# Patient Record
Sex: Female | Born: 1960 | Race: White | Hispanic: No | Marital: Married | Smoking: Never smoker
Health system: Southern US, Community
[De-identification: ages and names within clinical notes are randomized; demographics above are authoritative.]

## PROBLEM LIST (undated history)

## (undated) DIAGNOSIS — F419 Anxiety disorder, unspecified: Secondary | ICD-10-CM

## (undated) DIAGNOSIS — N1330 Unspecified hydronephrosis: Secondary | ICD-10-CM

## (undated) DIAGNOSIS — M12812 Other specific arthropathies, not elsewhere classified, left shoulder: Secondary | ICD-10-CM

## (undated) DIAGNOSIS — K5909 Other constipation: Secondary | ICD-10-CM

## (undated) DIAGNOSIS — Z8619 Personal history of other infectious and parasitic diseases: Secondary | ICD-10-CM

## (undated) DIAGNOSIS — M75102 Unspecified rotator cuff tear or rupture of left shoulder, not specified as traumatic: Secondary | ICD-10-CM

## (undated) DIAGNOSIS — R112 Nausea with vomiting, unspecified: Secondary | ICD-10-CM

## (undated) DIAGNOSIS — J4520 Mild intermittent asthma, uncomplicated: Secondary | ICD-10-CM

## (undated) DIAGNOSIS — C786 Secondary malignant neoplasm of retroperitoneum and peritoneum: Secondary | ICD-10-CM

## (undated) DIAGNOSIS — K219 Gastro-esophageal reflux disease without esophagitis: Secondary | ICD-10-CM

## (undated) DIAGNOSIS — R32 Unspecified urinary incontinence: Secondary | ICD-10-CM

## (undated) DIAGNOSIS — N135 Crossing vessel and stricture of ureter without hydronephrosis: Secondary | ICD-10-CM

## (undated) DIAGNOSIS — C539 Malignant neoplasm of cervix uteri, unspecified: Secondary | ICD-10-CM

## (undated) DIAGNOSIS — IMO0001 Reserved for inherently not codable concepts without codable children: Secondary | ICD-10-CM

## (undated) DIAGNOSIS — D6481 Anemia due to antineoplastic chemotherapy: Secondary | ICD-10-CM

## (undated) DIAGNOSIS — R350 Frequency of micturition: Secondary | ICD-10-CM

## (undated) DIAGNOSIS — Z9889 Other specified postprocedural states: Secondary | ICD-10-CM

## (undated) DIAGNOSIS — T451X5A Adverse effect of antineoplastic and immunosuppressive drugs, initial encounter: Secondary | ICD-10-CM

## (undated) DIAGNOSIS — R030 Elevated blood-pressure reading, without diagnosis of hypertension: Secondary | ICD-10-CM

## (undated) DIAGNOSIS — G620 Drug-induced polyneuropathy: Secondary | ICD-10-CM

## (undated) HISTORY — PX: TONSILLECTOMY AND ADENOIDECTOMY: SUR1326

---

## 1989-01-26 HISTORY — PX: LAPAROSCOPIC CHOLECYSTECTOMY: SUR755

## 1997-12-08 ENCOUNTER — Other Ambulatory Visit: Admission: RE | Admit: 1997-12-08 | Discharge: 1997-12-08 | Payer: Self-pay | Admitting: Obstetrics and Gynecology

## 2000-06-07 ENCOUNTER — Encounter: Payer: Self-pay | Admitting: Family Medicine

## 2000-06-07 ENCOUNTER — Encounter: Admission: RE | Admit: 2000-06-07 | Discharge: 2000-06-07 | Payer: Self-pay | Admitting: Family Medicine

## 2000-10-08 ENCOUNTER — Encounter: Payer: Self-pay | Admitting: General Surgery

## 2000-10-09 ENCOUNTER — Observation Stay (HOSPITAL_COMMUNITY): Admission: RE | Admit: 2000-10-09 | Discharge: 2000-10-10 | Payer: Self-pay | Admitting: General Surgery

## 2000-12-18 ENCOUNTER — Other Ambulatory Visit: Admission: RE | Admit: 2000-12-18 | Discharge: 2000-12-18 | Payer: Self-pay | Admitting: Obstetrics and Gynecology

## 2001-01-03 ENCOUNTER — Ambulatory Visit (HOSPITAL_COMMUNITY): Admission: RE | Admit: 2001-01-03 | Discharge: 2001-01-03 | Payer: Self-pay | Admitting: Obstetrics and Gynecology

## 2003-01-26 ENCOUNTER — Inpatient Hospital Stay (HOSPITAL_COMMUNITY): Admission: AD | Admit: 2003-01-26 | Discharge: 2003-01-29 | Payer: Self-pay | Admitting: Obstetrics and Gynecology

## 2003-03-09 ENCOUNTER — Other Ambulatory Visit: Admission: RE | Admit: 2003-03-09 | Discharge: 2003-03-09 | Payer: Self-pay | Admitting: Obstetrics and Gynecology

## 2011-11-20 ENCOUNTER — Other Ambulatory Visit: Payer: Self-pay | Admitting: Family Medicine

## 2011-11-20 ENCOUNTER — Other Ambulatory Visit (HOSPITAL_COMMUNITY)
Admission: RE | Admit: 2011-11-20 | Discharge: 2011-11-20 | Disposition: A | Discharge status: Home / Self Care | Payer: BC Managed Care – PPO | Source: Ambulatory Visit | Attending: Family Medicine | Admitting: Family Medicine

## 2011-11-20 DIAGNOSIS — Z124 Encounter for screening for malignant neoplasm of cervix: Secondary | ICD-10-CM | POA: Insufficient documentation

## 2011-11-21 ENCOUNTER — Other Ambulatory Visit: Payer: Self-pay | Admitting: Family Medicine

## 2011-11-21 DIAGNOSIS — R1310 Dysphagia, unspecified: Secondary | ICD-10-CM

## 2011-11-28 ENCOUNTER — Other Ambulatory Visit: Payer: Self-pay | Admitting: Family Medicine

## 2011-11-28 ENCOUNTER — Ambulatory Visit
Admission: RE | Admit: 2011-11-28 | Discharge: 2011-11-28 | Disposition: A | Discharge status: Home / Self Care | Payer: BC Managed Care – PPO | Source: Ambulatory Visit | Attending: Family Medicine | Admitting: Family Medicine

## 2011-11-28 DIAGNOSIS — R1310 Dysphagia, unspecified: Secondary | ICD-10-CM

## 2011-11-28 DIAGNOSIS — Z1231 Encounter for screening mammogram for malignant neoplasm of breast: Secondary | ICD-10-CM

## 2011-12-10 ENCOUNTER — Ambulatory Visit
Admission: RE | Admit: 2011-12-10 | Discharge: 2011-12-10 | Disposition: A | Discharge status: Home / Self Care | Payer: BC Managed Care – PPO | Source: Ambulatory Visit | Attending: Family Medicine | Admitting: Family Medicine

## 2011-12-10 DIAGNOSIS — Z1231 Encounter for screening mammogram for malignant neoplasm of breast: Secondary | ICD-10-CM

## 2013-12-10 ENCOUNTER — Other Ambulatory Visit: Payer: Self-pay

## 2013-12-10 DIAGNOSIS — Z1231 Encounter for screening mammogram for malignant neoplasm of breast: Secondary | ICD-10-CM

## 2013-12-15 ENCOUNTER — Encounter (INDEPENDENT_AMBULATORY_CARE_PROVIDER_SITE_OTHER): Payer: Self-pay

## 2013-12-15 ENCOUNTER — Ambulatory Visit
Admission: RE | Admit: 2013-12-15 | Discharge: 2013-12-15 | Disposition: A | Discharge status: Home / Self Care | Payer: BC Managed Care – PPO | Source: Ambulatory Visit

## 2013-12-15 DIAGNOSIS — Z1231 Encounter for screening mammogram for malignant neoplasm of breast: Secondary | ICD-10-CM

## 2014-05-25 ENCOUNTER — Other Ambulatory Visit (HOSPITAL_COMMUNITY)
Admission: RE | Admit: 2014-05-25 | Discharge: 2014-05-25 | Disposition: A | Discharge status: Home / Self Care | Payer: BC Managed Care – PPO | Source: Ambulatory Visit | Attending: Obstetrics & Gynecology | Admitting: Obstetrics & Gynecology

## 2014-05-25 ENCOUNTER — Other Ambulatory Visit: Payer: Self-pay | Admitting: Obstetrics & Gynecology

## 2014-05-25 DIAGNOSIS — Z1151 Encounter for screening for human papillomavirus (HPV): Secondary | ICD-10-CM | POA: Diagnosis present

## 2014-05-25 DIAGNOSIS — Z01411 Encounter for gynecological examination (general) (routine) with abnormal findings: Secondary | ICD-10-CM | POA: Insufficient documentation

## 2014-05-31 LAB — CYTOLOGY - PAP

## 2014-10-11 ENCOUNTER — Emergency Department (HOSPITAL_COMMUNITY): Payer: BC Managed Care – PPO

## 2014-10-11 ENCOUNTER — Encounter (HOSPITAL_COMMUNITY): Payer: Self-pay | Admitting: *Deleted

## 2014-10-11 ENCOUNTER — Inpatient Hospital Stay (HOSPITAL_COMMUNITY)
Admission: EM | Admit: 2014-10-11 | Discharge: 2014-10-18 | DRG: 853 | Disposition: A | Discharge status: Home / Self Care | Payer: BC Managed Care – PPO | Attending: Internal Medicine | Admitting: Internal Medicine

## 2014-10-11 DIAGNOSIS — C530 Malignant neoplasm of endocervix: Secondary | ICD-10-CM | POA: Diagnosis present

## 2014-10-11 DIAGNOSIS — A408 Other streptococcal sepsis: Secondary | ICD-10-CM | POA: Diagnosis not present

## 2014-10-11 DIAGNOSIS — N179 Acute kidney failure, unspecified: Secondary | ICD-10-CM | POA: Diagnosis present

## 2014-10-11 DIAGNOSIS — Z923 Personal history of irradiation: Secondary | ICD-10-CM

## 2014-10-11 DIAGNOSIS — Z6829 Body mass index (BMI) 29.0-29.9, adult: Secondary | ICD-10-CM

## 2014-10-11 DIAGNOSIS — K521 Toxic gastroenteritis and colitis: Secondary | ICD-10-CM | POA: Diagnosis not present

## 2014-10-11 DIAGNOSIS — N151 Renal and perinephric abscess: Secondary | ICD-10-CM | POA: Diagnosis present

## 2014-10-11 DIAGNOSIS — C539 Malignant neoplasm of cervix uteri, unspecified: Secondary | ICD-10-CM | POA: Insufficient documentation

## 2014-10-11 DIAGNOSIS — E876 Hypokalemia: Secondary | ICD-10-CM | POA: Diagnosis not present

## 2014-10-11 DIAGNOSIS — Z885 Allergy status to narcotic agent status: Secondary | ICD-10-CM

## 2014-10-11 DIAGNOSIS — Z9221 Personal history of antineoplastic chemotherapy: Secondary | ICD-10-CM

## 2014-10-11 DIAGNOSIS — N2889 Other specified disorders of kidney and ureter: Secondary | ICD-10-CM | POA: Diagnosis present

## 2014-10-11 DIAGNOSIS — E440 Moderate protein-calorie malnutrition: Secondary | ICD-10-CM | POA: Insufficient documentation

## 2014-10-11 DIAGNOSIS — Z7951 Long term (current) use of inhaled steroids: Secondary | ICD-10-CM

## 2014-10-11 DIAGNOSIS — Y842 Radiological procedure and radiotherapy as the cause of abnormal reaction of the patient, or of later complication, without mention of misadventure at the time of the procedure: Secondary | ICD-10-CM | POA: Diagnosis present

## 2014-10-11 DIAGNOSIS — A047 Enterocolitis due to Clostridium difficile: Secondary | ICD-10-CM | POA: Diagnosis present

## 2014-10-11 DIAGNOSIS — N131 Hydronephrosis with ureteral stricture, not elsewhere classified: Secondary | ICD-10-CM | POA: Diagnosis present

## 2014-10-11 DIAGNOSIS — R7881 Bacteremia: Secondary | ICD-10-CM | POA: Diagnosis present

## 2014-10-11 DIAGNOSIS — R509 Fever, unspecified: Secondary | ICD-10-CM | POA: Diagnosis not present

## 2014-10-11 DIAGNOSIS — Z9049 Acquired absence of other specified parts of digestive tract: Secondary | ICD-10-CM | POA: Diagnosis present

## 2014-10-11 DIAGNOSIS — R6521 Severe sepsis with septic shock: Secondary | ICD-10-CM | POA: Diagnosis present

## 2014-10-11 DIAGNOSIS — N1330 Unspecified hydronephrosis: Secondary | ICD-10-CM

## 2014-10-11 DIAGNOSIS — E860 Dehydration: Secondary | ICD-10-CM | POA: Diagnosis present

## 2014-10-11 DIAGNOSIS — Z79899 Other long term (current) drug therapy: Secondary | ICD-10-CM

## 2014-10-11 DIAGNOSIS — Z791 Long term (current) use of non-steroidal anti-inflammatories (NSAID): Secondary | ICD-10-CM

## 2014-10-11 DIAGNOSIS — R58 Hemorrhage, not elsewhere classified: Secondary | ICD-10-CM | POA: Diagnosis present

## 2014-10-11 DIAGNOSIS — N3040 Irradiation cystitis without hematuria: Secondary | ICD-10-CM | POA: Diagnosis present

## 2014-10-11 DIAGNOSIS — E871 Hypo-osmolality and hyponatremia: Secondary | ICD-10-CM | POA: Diagnosis present

## 2014-10-11 DIAGNOSIS — T3695XA Adverse effect of unspecified systemic antibiotic, initial encounter: Secondary | ICD-10-CM | POA: Diagnosis not present

## 2014-10-11 DIAGNOSIS — A419 Sepsis, unspecified organism: Secondary | ICD-10-CM

## 2014-10-11 DIAGNOSIS — N135 Crossing vessel and stricture of ureter without hydronephrosis: Secondary | ICD-10-CM | POA: Diagnosis present

## 2014-10-11 LAB — URINE MICROSCOPIC-ADD ON

## 2014-10-11 LAB — COMPREHENSIVE METABOLIC PANEL
ALBUMIN: 2.5 g/dL — AB (ref 3.5–5.0)
ALT: 19 U/L (ref 14–54)
AST: 27 U/L (ref 15–41)
Alkaline Phosphatase: 106 U/L (ref 38–126)
Anion gap: 11 (ref 5–15)
BUN: 11 mg/dL (ref 6–20)
CHLORIDE: 92 mmol/L — AB (ref 101–111)
CO2: 25 mmol/L (ref 22–32)
Calcium: 8.1 mg/dL — ABNORMAL LOW (ref 8.9–10.3)
Creatinine, Ser: 1.16 mg/dL — ABNORMAL HIGH (ref 0.44–1.00)
GFR calc Af Amer: >60 mL/min (ref >60)
GFR, EST NON AFRICAN AMERICAN: 53 mL/min — AB (ref >60)
Glucose, Bld: 110 mg/dL — ABNORMAL HIGH (ref 65–99)
Potassium: 2.9 mmol/L — ABNORMAL LOW (ref 3.5–5.1)
SODIUM: 128 mmol/L — AB (ref 135–145)
Total Bilirubin: 1.1 mg/dL (ref 0.3–1.2)
Total Protein: 7.8 g/dL (ref 6.5–8.1)

## 2014-10-11 LAB — I-STAT CG4 LACTIC ACID, ED: Lactic Acid, Venous: 3.46 mmol/L (ref 0.5–2.0)

## 2014-10-11 LAB — CBC WITH DIFFERENTIAL/PLATELET
BASOS ABS: 0 10*3/uL (ref 0.0–0.1)
Basophils Relative: 0 % (ref 0–1)
Eosinophils Absolute: 0 10*3/uL (ref 0.0–0.7)
Eosinophils Relative: 0 % (ref 0–5)
HEMATOCRIT: 23.1 % — AB (ref 36.0–46.0)
Hemoglobin: 7.3 g/dL — ABNORMAL LOW (ref 12.0–15.0)
LYMPHS PCT: 1 % — AB (ref 12–46)
Lymphs Abs: 0.1 10*3/uL — ABNORMAL LOW (ref 0.7–4.0)
MCH: 29.8 pg (ref 26.0–34.0)
MCHC: 31.6 g/dL (ref 30.0–36.0)
MCV: 94.3 fL (ref 78.0–100.0)
MONO ABS: 0.1 10*3/uL (ref 0.1–1.0)
Monocytes Relative: 1 % — ABNORMAL LOW (ref 3–12)
NEUTROS ABS: 8.5 10*3/uL — AB (ref 1.7–7.7)
Neutrophils Relative %: 98 % — ABNORMAL HIGH (ref 43–77)
Platelets: 409 10*3/uL — ABNORMAL HIGH (ref 150–400)
RBC: 2.45 MIL/uL — AB (ref 3.87–5.11)
RDW: 20.3 % — AB (ref 11.5–15.5)
WBC: 8.7 10*3/uL (ref 4.0–10.5)

## 2014-10-11 LAB — URINALYSIS, ROUTINE W REFLEX MICROSCOPIC
Bilirubin Urine: NEGATIVE
GLUCOSE, UA: NEGATIVE mg/dL
Hgb urine dipstick: NEGATIVE
Ketones, ur: NEGATIVE mg/dL
NITRITE: NEGATIVE
PH: 6 (ref 5.0–8.0)
Protein, ur: 30 mg/dL — AB
Specific Gravity, Urine: 1.017 (ref 1.005–1.030)
Urobilinogen, UA: 1 mg/dL (ref 0.0–1.0)

## 2014-10-11 MED ORDER — DEXTROSE 5 % IV SOLN
2.0000 g | Freq: Once | INTRAVENOUS | Status: AC
  Administered 2014-10-11: 2 g via INTRAVENOUS
  Filled 2014-10-11: qty 2

## 2014-10-11 MED ORDER — DEXTROSE 5 % IV SOLN
2.0000 g | INTRAVENOUS | Status: DC
  Administered 2014-10-12 – 2014-10-14 (×3): 2 g via INTRAVENOUS
  Filled 2014-10-11 (×3): qty 2

## 2014-10-11 MED ORDER — ACETAMINOPHEN 325 MG PO TABS
650.0000 mg | ORAL_TABLET | Freq: Four times a day (QID) | ORAL | Status: DC | PRN
  Administered 2014-10-11 – 2014-10-16 (×8): 650 mg via ORAL
  Filled 2014-10-11 (×8): qty 2

## 2014-10-11 MED ORDER — SODIUM CHLORIDE 0.9 % IV BOLUS (SEPSIS)
2000.0000 mL | Freq: Once | INTRAVENOUS | Status: AC
  Administered 2014-10-11: 2000 mL via INTRAVENOUS

## 2014-10-11 MED ORDER — VANCOMYCIN HCL 10 G IV SOLR
1250.0000 mg | Freq: Once | INTRAVENOUS | Status: AC
  Administered 2014-10-12: 1250 mg via INTRAVENOUS
  Filled 2014-10-11: qty 1250

## 2014-10-11 MED ORDER — SODIUM CHLORIDE 0.9 % IV BOLUS (SEPSIS)
1000.0000 mL | Freq: Once | INTRAVENOUS | Status: AC
  Administered 2014-10-12: 1000 mL via INTRAVENOUS

## 2014-10-11 NOTE — ED Provider Notes (Signed)
The patient is an ill-appearing 54 year old female with a history of uterine cancer, has undergone chemotherapy and radiation therapy, for the last several weeks the patient has had subjective fevers, this is now become objective and is gradually worsening and associated with right-sided flank and right-sided abdominal pain. She also reports some difficulty with urination. Apparently the patient had a CT scan yesterday at an outlying hospital, the report showed some inflammation around the right kidney per report. On exam the patient has tenderness in the right side, right CVA area, suprapubic area, she is tachycardic, she does not appear to be in respiratory distress and has no peripheral edema or suspicious rashes. Her oropharyngeal exam is consistent with dehydration as she has dehydrated mucous membranes otherwise she has a clear nostrils, no lymphadenopathy in a very supple neck.  The patient has an elevated lactic acid at 3.46, this is consistent with a sepsis state, fever of 103, tachycardia to 115 as well as a source of infection which is likely urinary, pyelonephritis suspected. Antibiotics ordered, code sepsis activated, the patient will need to be admitted to the hospital.  Meds given in ED:  Medications  acetaminophen (TYLENOL) tablet 650 mg ( Oral MAR Unhold 10/13/14 1104)  cefTRIAXone (ROCEPHIN) 2 g in dextrose 5 % 50 mL IVPB (2 g Intravenous Given 10/13/14 2232)  0.9 %  sodium chloride infusion ( Intravenous New Bag/Given 10/13/14 1527)  vancomycin (VANCOCIN) IVPB 750 mg/150 ml premix (750 mg Intravenous Given 10/14/14 0007)  fentaNYL (SUBLIMAZE) injection 12.5 mcg ( Intravenous MAR Unhold 10/13/14 1104)  albuterol (PROVENTIL) (2.5 MG/3ML) 0.083% nebulizer solution 2.5 mg (not administered)  pantoprazole (PROTONIX) EC tablet 40 mg (40 mg Oral Given 10/14/14 0948)  cefTRIAXone (ROCEPHIN) 2 g in dextrose 5 % 50 mL IVPB (0 g Intravenous Stopped 10/11/14 2258)  sodium chloride 0.9 % bolus 2,000 mL  (0 mLs Intravenous Stopped 10/12/14 0016)  vancomycin (VANCOCIN) 1,250 mg in sodium chloride 0.9 % 250 mL IVPB (0 mg Intravenous Stopped 10/12/14 0151)  sodium chloride 0.9 % bolus 1,000 mL (0 mLs Intravenous Stopped 10/12/14 0259)  iohexol (OMNIPAQUE) 300 MG/ML solution 50 mL (50 mLs Oral Contrast Given 10/12/14 0036)  iohexol (OMNIPAQUE) 300 MG/ML solution 100 mL (100 mLs Intravenous Contrast Given 10/12/14 0112)  potassium chloride 10 mEq in 100 mL IVPB (0 mEq Intravenous Stopped 10/12/14 0701)  sodium chloride 0.9 % bolus 1,000 mL (0 mLs Intravenous Stopped 10/12/14 0521)  norepinephrine (LEVOPHED) 4 mg in dextrose 5 % 250 mL (0.016 mg/mL) infusion (0 mcg/min Intravenous Stopped 10/12/14 0812)  sodium chloride 0.9 % bolus 1,000 mL (1,000 mLs Intravenous New Bag/Given 10/12/14 0831)  0.9 %  sodium chloride infusion ( Intravenous New Bag/Given 10/12/14 1121)  vitamin A & D ointment (1 application  Given 3/33/54 1736)  fentaNYL (SUBLIMAZE) 100 MCG/2ML injection (  Duplicate 5/62/56 3893)  midazolam (VERSED) 2 MG/2ML injection (  Duplicate 7/34/28 7681)  midazolam (VERSED) injection (0.5 mg Intravenous Given 10/12/14 1516)  fentaNYL (SUBLIMAZE) injection (25 mcg Intravenous Given 10/12/14 1517)  furosemide (LASIX) injection 20 mg (20 mg Intravenous Given 10/12/14 2013)  potassium chloride SA (K-DUR,KLOR-CON) CR tablet 40 mEq (40 mEq Oral Given 10/13/14 1533)    CRITICAL CARE Performed by: Noemi Chapel D Total critical care time: 35 Critical care time was exclusive of separately billable procedures and treating other patients. Critical care was necessary to treat or prevent imminent or life-threatening deterioration. Critical care was time spent personally by me on the following activities: development of treatment plan  with patient and/or surrogate as well as nursing, discussions with consultants, evaluation of patient's response to treatment, examination of patient, obtaining history from patient or  surrogate, ordering and performing treatments and interventions, ordering and review of laboratory studies, ordering and review of radiographic studies, pulse oximetry and re-evaluation of patient's condition.   Noemi Chapel, MD 10/14/14 1006

## 2014-10-11 NOTE — ED Notes (Signed)
Pt's family reports fever x 3 weeks, has tried OTC antipyretics without relief.  Pt is being treated for uterine and cervical cancer.  Reports she had her last chemo and radiation 4/8.   States they have called her oncologist and would not see pt, has pending appt this Thursday.  Also reports decreased appetite.

## 2014-10-11 NOTE — ED Notes (Signed)
MD Sabra Heck made aware of abnormal lactic acid value

## 2014-10-11 NOTE — Progress Notes (Signed)
ANTIBIOTIC CONSULT NOTE - INITIAL  Pharmacy Consult for ceftriaxone Indication: sepsis w/ UTI  Allergies  Allergen Reactions  . Codeine Nausea Only  . Hydrocodone Nausea And Vomiting    Patient Measurements: Height: 5\' 4"  (162.6 cm) IBW/kg (Calculated) : 54.7 Adjusted Body Weight:   Vital Signs: Temp: 103 F (39.4 C) (05/16 2004) Temp Source: Oral (05/16 2004) BP: 109/58 mmHg (05/16 2030) Pulse Rate: 118 (05/16 2030) Intake/Output from previous day:   Intake/Output from this shift:    Labs: No results for input(s): WBC, HGB, PLT, LABCREA, CREATININE in the last 72 hours. CrCl cannot be calculated (Unknown ideal weight.). No results for input(s): VANCOTROUGH, VANCOPEAK, VANCORANDOM, GENTTROUGH, GENTPEAK, GENTRANDOM, TOBRATROUGH, TOBRAPEAK, TOBRARND, AMIKACINPEAK, AMIKACINTROU, AMIKACIN in the last 72 hours.   Microbiology: No results found for this or any previous visit (from the past 720 hour(s)).  Medical History: Past Medical History  Diagnosis Date  . Cancer     uterine and cervical   Assessment: 74 YOF presents with fever and abdominal pain, she is undergoing chemo for uterine and cervical cancer.  Pharmacy asked to dose ceftriaxone for sepsis with UTI.   5/16 >> ceftriaxone >>  5/16 blood 5/16 Urine  Tm in ED = 103  Goal of Therapy:  Dose per patient-specific parameters  Plan:   Continue ceftriaxone 2gm IV q24h for sepsis with UTI  No dose adjustment needed at this time  Doreene Eland, PharmD, BCPS.   Pager: [phone] 10/11/2014,8:54 PM

## 2014-10-11 NOTE — ED Provider Notes (Signed)
CSN: [id number]     Arrival date & time 10/11/14  1956 History   First MD Initiated Contact with Patient 10/11/14 2021     Chief Complaint  Patient presents with  . Fever  . Abdominal Pain     (Consider location/radiation/quality/duration/timing/severity/associated sxs/prior Treatment) HPI Comments: Patient is a 54 year old female with uterine and cervical cancer who is being treated at a [facility] facility in [place] who presents with a fever for 3 weeks and right flank pain for 2 weeks. Symptoms started gradually and progressively worsened since the onset. The pain is aching and severe with radiation to her right abdomen. Palpation makes the pain worse. No alleviating factors. Patient's fever has been around 101F at home. She has been treating her fever with OTC antipyretics at home. She called her Oncologist today in hopes of being seen and was told to wait for her appointment in 3 days. Patient was started on Cipro for possible UTI about 8 days ago which provided no relief. Patient also had an outpatient CT scan at a [facility] which showed right hydronephrosis and right kidney inflammation. Patient received her last chemo infusion of Cisplatin on 09/03/2014 as well as her last radiation treatment.   Dr. Lynnda Child Oncologist Dr. Randa Spike Onc   Past Medical History  Diagnosis Date  . Cancer     uterine and cervical   Past Surgical History  Procedure Laterality Date  . Cholecystectomy    . Tonsillectomy     No family history on file. History  Substance Use Topics  . Smoking status: Never Smoker   . Smokeless tobacco: Not on file  . Alcohol Use: No   OB History    No data available     Review of Systems  Constitutional: Positive for fever. Negative for chills and fatigue.  HENT: Negative for trouble swallowing.   Eyes: Negative for visual disturbance.  Respiratory: Negative for shortness of breath.   Cardiovascular: Negative for chest pain  and palpitations.  Gastrointestinal: Positive for abdominal pain. Negative for nausea, vomiting and diarrhea.  Genitourinary: Positive for flank pain. Negative for dysuria and difficulty urinating.  Musculoskeletal: Negative for arthralgias and neck pain.  Skin: Negative for color change.  Neurological: Negative for dizziness and weakness.  Psychiatric/Behavioral: Negative for dysphoric mood.      Allergies  Codeine and Hydrocodone  Home Medications   Prior to Admission medications   Medication Sig Start Date End Date Taking? Authorizing Provider  ADVAIR DISKUS 250-50 MCG/DOSE AEPB  10/02/14   Historical Provider, MD  LORazepam (ATIVAN) 1 MG tablet  09/29/14   Historical Provider, MD  ondansetron (ZOFRAN-ODT) 8 MG disintegrating tablet  08/11/14   Historical Provider, MD   BP 109/58 mmHg  Pulse 118  Temp(Src) 103 F (39.4 C) (Oral)  Resp 19  Ht 5\' 4"  (1.626 m)  SpO2 100% Physical Exam  Constitutional: She appears well-developed and well-nourished. No distress.  HENT:  Head: Normocephalic and atraumatic.  Eyes: Conjunctivae and EOM are normal.  Neck: Normal range of motion.  Cardiovascular: Regular rhythm.  Exam reveals no gallop and no friction rub.   No murmur heard. Tachycardic.   Pulmonary/Chest: Effort normal and breath sounds normal. She has no wheezes. She has no rales. She exhibits no tenderness.  Abdominal: Soft. She exhibits no distension. There is tenderness. There is no rebound.  Right sided abdominal tenderness to palpation. No other focal tenderness or peritoneal signs.   Genitourinary:  Right CVA tenderness to palpation.  Musculoskeletal: Normal range of motion.  Neurological: She is alert. Coordination normal.  Speech is goal-oriented. Moves limbs without ataxia.   Skin: Skin is warm and dry.  Psychiatric: She has a normal mood and affect. Her behavior is normal.  Nursing note and vitals reviewed.   ED Course  Procedures (including critical care  time)  CRITICAL CARE Performed by: Alvina Chou   Total critical care time: 1 hour  Critical care time was exclusive of separately billable procedures and treating other patients.  Critical care was necessary to treat or prevent imminent or life-threatening deterioration.  Critical care was time spent personally by me on the following activities: development of treatment plan with patient and/or surrogate as well as nursing, discussions with consultants, evaluation of patient's response to treatment, examination of patient, obtaining history from patient or surrogate, ordering and performing treatments and interventions, ordering and review of laboratory studies, ordering and review of radiographic studies, pulse oximetry and re-evaluation of patient's condition.   Labs Review Labs Reviewed  CBC WITH DIFFERENTIAL/PLATELET - Abnormal; Notable for the following:    RBC 2.45 (*)    Hemoglobin 7.3 (*)    HCT 23.1 (*)    RDW 20.3 (*)    Platelets 409 (*)    Neutrophils Relative % 98 (*)    Neutro Abs 8.5 (*)    Lymphocytes Relative 1 (*)    Lymphs Abs 0.1 (*)    Monocytes Relative 1 (*)    All other components within normal limits  COMPREHENSIVE METABOLIC PANEL - Abnormal; Notable for the following:    Sodium 128 (*)    Potassium 2.9 (*)    Chloride 92 (*)    Glucose, Bld 110 (*)    Creatinine, Ser 1.16 (*)    Calcium 8.1 (*)    Albumin 2.5 (*)    GFR calc non Af Amer 53 (*)    All other components within normal limits  URINALYSIS, ROUTINE W REFLEX MICROSCOPIC - Abnormal; Notable for the following:    Color, Urine AMBER (*)    APPearance CLOUDY (*)    Protein, ur 30 (*)    Leukocytes, UA MODERATE (*)    All other components within normal limits  I-STAT CG4 LACTIC ACID, ED - Abnormal; Notable for the following:    Lactic Acid, Venous 3.46 (*)    All other components within normal limits  I-STAT CG4 LACTIC ACID, ED - Abnormal; Notable for the following:    Lactic Acid,  Venous 2.80 (*)    All other components within normal limits  CULTURE, BLOOD (ROUTINE X 2)  CULTURE, BLOOD (ROUTINE X 2)  URINE CULTURE  URINE MICROSCOPIC-ADD ON  I-STAT CG4 LACTIC ACID, ED  I-STAT CG4 LACTIC ACID, ED    Imaging Review Dg Chest Port 1 View  10/11/2014   CLINICAL DATA:  54 year old female with 5 week history of fever. Medical history includes uterine and cervical cancer.  EXAM: PORTABLE CHEST - 1 VIEW  COMPARISON:  None.  FINDINGS: The lungs are clear and negative for focal airspace consolidation, pulmonary edema or suspicious pulmonary nodule. No pleural effusion or pneumothorax. Cardiac and mediastinal contours are within normal limits. No acute fracture or lytic or blastic osseous lesions. The visualized upper abdominal bowel gas pattern is unremarkable.  IMPRESSION: No active cardiopulmonary disease.   Electronically Signed   By: Jacqulynn Cadet M.D.   On: 10/11/2014 21:20     EKG Interpretation None      MDM   Final  diagnoses:  Sepsis, due to unspecified organism    8:55 PM Sepsis work up started. Patient started on rocephin. Patient febrile and tachycardic at this time.   12:36 AM Patient started on Rocephin and added Vancomycin to her regimen. Discussed patient with Urologist on call who recommends re-imaging. Chest xray unremarkable.    Alvina Chou, PA-C 10/13/14 3794  Noemi Chapel, MD 10/14/14 1006

## 2014-10-12 ENCOUNTER — Encounter (HOSPITAL_COMMUNITY): Payer: Self-pay

## 2014-10-12 ENCOUNTER — Inpatient Hospital Stay (HOSPITAL_COMMUNITY): Payer: BC Managed Care – PPO

## 2014-10-12 ENCOUNTER — Emergency Department (HOSPITAL_COMMUNITY): Payer: BC Managed Care – PPO

## 2014-10-12 ENCOUNTER — Other Ambulatory Visit: Payer: Self-pay | Admitting: Urology

## 2014-10-12 DIAGNOSIS — E440 Moderate protein-calorie malnutrition: Secondary | ICD-10-CM | POA: Diagnosis present

## 2014-10-12 DIAGNOSIS — K521 Toxic gastroenteritis and colitis: Secondary | ICD-10-CM | POA: Diagnosis not present

## 2014-10-12 DIAGNOSIS — E871 Hypo-osmolality and hyponatremia: Secondary | ICD-10-CM | POA: Diagnosis present

## 2014-10-12 DIAGNOSIS — A047 Enterocolitis due to Clostridium difficile: Secondary | ICD-10-CM | POA: Diagnosis present

## 2014-10-12 DIAGNOSIS — N2889 Other specified disorders of kidney and ureter: Secondary | ICD-10-CM | POA: Diagnosis not present

## 2014-10-12 DIAGNOSIS — C530 Malignant neoplasm of endocervix: Secondary | ICD-10-CM | POA: Diagnosis present

## 2014-10-12 DIAGNOSIS — N1330 Unspecified hydronephrosis: Secondary | ICD-10-CM | POA: Diagnosis not present

## 2014-10-12 DIAGNOSIS — E876 Hypokalemia: Secondary | ICD-10-CM | POA: Diagnosis not present

## 2014-10-12 DIAGNOSIS — N179 Acute kidney failure, unspecified: Secondary | ICD-10-CM | POA: Diagnosis present

## 2014-10-12 DIAGNOSIS — Z6829 Body mass index (BMI) 29.0-29.9, adult: Secondary | ICD-10-CM | POA: Diagnosis not present

## 2014-10-12 DIAGNOSIS — N151 Renal and perinephric abscess: Secondary | ICD-10-CM | POA: Diagnosis present

## 2014-10-12 DIAGNOSIS — A408 Other streptococcal sepsis: Secondary | ICD-10-CM | POA: Diagnosis present

## 2014-10-12 DIAGNOSIS — E860 Dehydration: Secondary | ICD-10-CM | POA: Diagnosis present

## 2014-10-12 DIAGNOSIS — IMO0001 Reserved for inherently not codable concepts without codable children: Secondary | ICD-10-CM | POA: Insufficient documentation

## 2014-10-12 DIAGNOSIS — A419 Sepsis, unspecified organism: Secondary | ICD-10-CM | POA: Diagnosis not present

## 2014-10-12 DIAGNOSIS — R6521 Severe sepsis with septic shock: Secondary | ICD-10-CM | POA: Diagnosis present

## 2014-10-12 DIAGNOSIS — Z79899 Other long term (current) drug therapy: Secondary | ICD-10-CM | POA: Diagnosis not present

## 2014-10-12 DIAGNOSIS — N131 Hydronephrosis with ureteral stricture, not elsewhere classified: Secondary | ICD-10-CM | POA: Diagnosis present

## 2014-10-12 DIAGNOSIS — Z923 Personal history of irradiation: Secondary | ICD-10-CM | POA: Diagnosis not present

## 2014-10-12 DIAGNOSIS — Z791 Long term (current) use of non-steroidal anti-inflammatories (NSAID): Secondary | ICD-10-CM | POA: Diagnosis not present

## 2014-10-12 DIAGNOSIS — T3695XA Adverse effect of unspecified systemic antibiotic, initial encounter: Secondary | ICD-10-CM | POA: Diagnosis not present

## 2014-10-12 DIAGNOSIS — Z885 Allergy status to narcotic agent status: Secondary | ICD-10-CM | POA: Diagnosis not present

## 2014-10-12 DIAGNOSIS — G459 Transient cerebral ischemic attack, unspecified: Secondary | ICD-10-CM | POA: Diagnosis not present

## 2014-10-12 DIAGNOSIS — Y842 Radiological procedure and radiotherapy as the cause of abnormal reaction of the patient, or of later complication, without mention of misadventure at the time of the procedure: Secondary | ICD-10-CM | POA: Diagnosis present

## 2014-10-12 DIAGNOSIS — A499 Bacterial infection, unspecified: Secondary | ICD-10-CM | POA: Diagnosis not present

## 2014-10-12 DIAGNOSIS — N3040 Irradiation cystitis without hematuria: Secondary | ICD-10-CM | POA: Diagnosis present

## 2014-10-12 DIAGNOSIS — R509 Fever, unspecified: Secondary | ICD-10-CM | POA: Diagnosis present

## 2014-10-12 DIAGNOSIS — Z9221 Personal history of antineoplastic chemotherapy: Secondary | ICD-10-CM | POA: Diagnosis not present

## 2014-10-12 DIAGNOSIS — Z9049 Acquired absence of other specified parts of digestive tract: Secondary | ICD-10-CM | POA: Diagnosis present

## 2014-10-12 DIAGNOSIS — R58 Hemorrhage, not elsewhere classified: Secondary | ICD-10-CM | POA: Diagnosis present

## 2014-10-12 DIAGNOSIS — Z7951 Long term (current) use of inhaled steroids: Secondary | ICD-10-CM | POA: Diagnosis not present

## 2014-10-12 DIAGNOSIS — C539 Malignant neoplasm of cervix uteri, unspecified: Secondary | ICD-10-CM | POA: Diagnosis not present

## 2014-10-12 LAB — PREPARE RBC (CROSSMATCH)

## 2014-10-12 LAB — PROTIME-INR
INR: 1.42 (ref 0.00–1.49)
Prothrombin Time: 17.5 seconds — ABNORMAL HIGH (ref 11.6–15.2)

## 2014-10-12 LAB — PROCALCITONIN: Procalcitonin: 21.61 ng/mL

## 2014-10-12 LAB — MAGNESIUM: Magnesium: 1.4 mg/dL — ABNORMAL LOW (ref 1.7–2.4)

## 2014-10-12 LAB — CREATININE, SERUM: CREATININE: 0.98 mg/dL (ref 0.44–1.00)

## 2014-10-12 LAB — ABO/RH: ABO/RH(D): A POS

## 2014-10-12 LAB — URINE CULTURE: Colony Count: 30000

## 2014-10-12 LAB — CBC
HEMATOCRIT: 15.5 % — AB (ref 36.0–46.0)
Hemoglobin: 5 g/dL — CL (ref 12.0–15.0)
MCH: 30.3 pg (ref 26.0–34.0)
MCHC: 32.3 g/dL (ref 30.0–36.0)
MCV: 93.9 fL (ref 78.0–100.0)
Platelets: 286 10*3/uL (ref 150–400)
RBC: 1.65 MIL/uL — AB (ref 3.87–5.11)
RDW: 20.3 % — ABNORMAL HIGH (ref 11.5–15.5)
WBC: 16.9 10*3/uL — ABNORMAL HIGH (ref 4.0–10.5)

## 2014-10-12 LAB — I-STAT CG4 LACTIC ACID, ED: Lactic Acid, Venous: 2.8 mmol/L (ref 0.5–2.0)

## 2014-10-12 LAB — CORTISOL: CORTISOL PLASMA: 19 ug/dL

## 2014-10-12 LAB — LACTIC ACID, PLASMA
LACTIC ACID, VENOUS: 1.8 mmol/L (ref 0.5–2.0)
Lactic Acid, Venous: 1.7 mmol/L (ref 0.5–2.0)

## 2014-10-12 LAB — MRSA PCR SCREENING: MRSA BY PCR: NEGATIVE

## 2014-10-12 LAB — APTT: APTT: 35 s (ref 24–37)

## 2014-10-12 LAB — STREP PNEUMONIAE URINARY ANTIGEN: Strep Pneumo Urinary Antigen: NEGATIVE

## 2014-10-12 LAB — PHOSPHORUS: Phosphorus: 1.8 mg/dL — ABNORMAL LOW (ref 2.5–4.6)

## 2014-10-12 MED ORDER — HEPARIN SODIUM (PORCINE) 5000 UNIT/ML IJ SOLN
5000.0000 [IU] | Freq: Three times a day (TID) | INTRAMUSCULAR | Status: DC
  Filled 2014-10-12 (×3): qty 1

## 2014-10-12 MED ORDER — FENTANYL CITRATE (PF) 100 MCG/2ML IJ SOLN
INTRAMUSCULAR | Status: DC
Start: 2014-10-12 — End: 2014-10-12
  Filled 2014-10-12: qty 4

## 2014-10-12 MED ORDER — VANCOMYCIN HCL IN DEXTROSE 1-5 GM/200ML-% IV SOLN
1000.0000 mg | INTRAVENOUS | Status: DC
Start: 2014-10-12 — End: 2014-10-12

## 2014-10-12 MED ORDER — MIDAZOLAM HCL 2 MG/2ML IJ SOLN
INTRAMUSCULAR | Status: AC
  Filled 2014-10-12: qty 4

## 2014-10-12 MED ORDER — IOHEXOL 300 MG/ML  SOLN
50.0000 mL | Freq: Once | INTRAMUSCULAR | Status: AC | PRN
  Administered 2014-10-12: 50 mL via ORAL

## 2014-10-12 MED ORDER — POTASSIUM CHLORIDE 10 MEQ/100ML IV SOLN
10.0000 meq | INTRAVENOUS | Status: AC
  Administered 2014-10-12 (×4): 10 meq via INTRAVENOUS
  Filled 2014-10-12 (×4): qty 100

## 2014-10-12 MED ORDER — ALBUTEROL SULFATE (2.5 MG/3ML) 0.083% IN NEBU
2.5000 mg | INHALATION_SOLUTION | RESPIRATORY_TRACT | Status: DC | PRN
  Administered 2014-10-12: 2.5 mg via RESPIRATORY_TRACT
  Filled 2014-10-12: qty 3

## 2014-10-12 MED ORDER — IOHEXOL 300 MG/ML  SOLN
100.0000 mL | Freq: Once | INTRAMUSCULAR | Status: AC | PRN
  Administered 2014-10-12: 100 mL via INTRAVENOUS

## 2014-10-12 MED ORDER — ONDANSETRON HCL 4 MG/2ML IJ SOLN
INTRAMUSCULAR | Status: AC
  Filled 2014-10-12: qty 2

## 2014-10-12 MED ORDER — NOREPINEPHRINE BITARTRATE 1 MG/ML IV SOLN
0.0000 ug/min | Freq: Once | INTRAVENOUS | Status: AC
  Administered 2014-10-12: 5 ug/min via INTRAVENOUS
  Filled 2014-10-12: qty 4

## 2014-10-12 MED ORDER — VANCOMYCIN HCL IN DEXTROSE 750-5 MG/150ML-% IV SOLN
750.0000 mg | Freq: Two times a day (BID) | INTRAVENOUS | Status: DC
  Administered 2014-10-12 – 2014-10-14 (×4): 750 mg via INTRAVENOUS
  Filled 2014-10-12 (×6): qty 150

## 2014-10-12 MED ORDER — SODIUM CHLORIDE 0.9 % IV BOLUS (SEPSIS)
1000.0000 mL | Freq: Once | INTRAVENOUS | Status: AC
  Administered 2014-10-12: 1000 mL via INTRAVENOUS

## 2014-10-12 MED ORDER — FUROSEMIDE 10 MG/ML IJ SOLN
20.0000 mg | Freq: Once | INTRAMUSCULAR | Status: AC
  Administered 2014-10-12: 20 mg via INTRAVENOUS
  Filled 2014-10-12: qty 2

## 2014-10-12 MED ORDER — PANTOPRAZOLE SODIUM 40 MG IV SOLR
40.0000 mg | Freq: Every day | INTRAVENOUS | Status: DC
  Administered 2014-10-12: 40 mg via INTRAVENOUS
  Filled 2014-10-12: qty 40

## 2014-10-12 MED ORDER — FENTANYL CITRATE (PF) 100 MCG/2ML IJ SOLN: 12.5000 ug | INTRAMUSCULAR | Status: DC | PRN

## 2014-10-12 MED ORDER — VITAMINS A & D EX OINT
TOPICAL_OINTMENT | CUTANEOUS | Status: AC
  Administered 2014-10-12: 1
  Filled 2014-10-12: qty 5

## 2014-10-12 MED ORDER — MIDAZOLAM HCL 2 MG/2ML IJ SOLN
INTRAMUSCULAR | Status: AC
  Filled 2014-10-12: qty 6

## 2014-10-12 MED ORDER — FENTANYL CITRATE (PF) 100 MCG/2ML IJ SOLN
INTRAMUSCULAR | Status: AC
  Filled 2014-10-12: qty 2

## 2014-10-12 MED ORDER — FENTANYL CITRATE (PF) 100 MCG/2ML IJ SOLN
INTRAMUSCULAR | Status: AC | PRN
Start: 2014-10-12 — End: 2014-10-12
  Administered 2014-10-12: 25 ug via INTRAVENOUS

## 2014-10-12 MED ORDER — SODIUM CHLORIDE 0.9 % IV SOLN
Freq: Once | INTRAVENOUS | Status: AC
  Administered 2014-10-12: 11:00:00 via INTRAVENOUS

## 2014-10-12 MED ORDER — MIDAZOLAM HCL 2 MG/2ML IJ SOLN
INTRAMUSCULAR | Status: AC | PRN
  Administered 2014-10-12: 0.5 mg via INTRAVENOUS

## 2014-10-12 MED ORDER — SODIUM CHLORIDE 0.9 % IV SOLN
INTRAVENOUS | Status: DC
  Administered 2014-10-12: 500 mL via INTRAVENOUS
  Administered 2014-10-12 (×2): 1000 mL via INTRAVENOUS
  Administered 2014-10-13 (×2): via INTRAVENOUS

## 2014-10-12 NOTE — H&P (Signed)
PULMONARY / CRITICAL CARE MEDICINE   Name: Stacy Webster MRN: [id number] DOB: [birth year]    ADMISSION DATE:  10/11/2014   REFERRING MD :  EDP  CHIEF COMPLAINT:  Rt flank pain  INITIAL PRESENTATION: 54 yo woman with cervical CA just completed neo-adjuvant chemo/XRT at [facility]. Presented w R flank pain and fever, found to have large R perinephric fluid collection and suspected ureteral obstruction. Developed shock in ED despite IVF. Evaluated by Urology and IR.   STUDIES:  5/17 ct abd as noted  SIGNIFICANT EVENTS: Perc drainage of R perinephric fluid collection 5/17  HISTORY OF PRESENT ILLNESS:   54 yo, married, school teacher, never smoker, dx with cervical cancer 12/15, completed rtx/chemo 4/16([facility]). Plan to have surgery once tumor has shrunk. She presents to [facility] ED 5/16 2000 hrs with chief complaint of fever up to 102 over last 2 weeks. [facility] instructed her to use antipyretics. She further developed intense rt flank pain without nausea or vomiting but positive for chills and sweats. In [facility] ED CT abd revealed blood right renal calyces, urinoma, Rt ureteral obstruction, possible tumor excursion, along with rtx changes.Treated in ED with IV fluids but had persistent hypotension that required pressor support. She is awake and alert, sbp 114 on 5 mcg of levo, making urine, therefore continue IVF, wean pressors, broad spectrum abx, admit to ICU. She is to have perq drain placed by IR, urology has seen her and placed foley.  PAST MEDICAL HISTORY :   has a past medical history of Cancer. cervical CA dx 12/15, has undergone neo-adjuvant chemo + XRT  has past surgical history that includes Cholecystectomy and Tonsillectomy. Prior to Admission medications   Medication Sig Start Date End Date Taking? Authorizing Provider  acetaminophen (TYLENOL) 500 MG tablet Take 500 mg by mouth every 6 (six) hours as needed.   Yes Historical Provider, MD  ADVAIR DISKUS 250-50 MCG/DOSE  AEPB Inhale 1 puff into the lungs daily.  10/02/14  Yes Historical Provider, MD  ibuprofen (ADVIL,MOTRIN) 200 MG tablet Take 600 mg by mouth every 6 (six) hours as needed for fever (fever).   Yes Historical Provider, MD  LORazepam (ATIVAN) 1 MG tablet Take 1 mg by mouth every 6 (six) hours as needed (nausea).  09/29/14  Yes Historical Provider, MD  ondansetron (ZOFRAN-ODT) 8 MG disintegrating tablet Take 8 mg by mouth every 8 (eight) hours as needed for nausea or vomiting.  08/11/14   Historical Provider, MD   Allergies  Allergen Reactions  . Codeine Nausea Only  . Hydrocodone Nausea And Vomiting    FAMILY HISTORY:  has no family status information on file.  SOCIAL HISTORY:  reports that she has never smoked. She does not have any smokeless tobacco history on file. She reports that she does not drink alcohol or use illicit drugs.  REVIEW OF SYSTEMS:  10 point review of system taken, please see HPI for positives and negatives.  SUBJECTIVE:   VITAL SIGNS: Temp:  [103 F (39.4 C)] 103 F (39.4 C) (05/16 2004) Pulse Rate:  [83-118] 86 (05/17 0715) Resp:  [12-27] 18 (05/17 0715) BP: (71-117)/(37-64) 114/58 mmHg (05/17 0715) SpO2:  [93 %-100 %] 100 % (05/17 0715) HEMODYNAMICS:   VENTILATOR SETTINGS:   INTAKE / OUTPUT: No intake or output data in the 24 hours ending 10/12/14 0723  PHYSICAL EXAMINATION: General:  Well-developed, NAD Neuro:  Oriented x3, non-focal HEENT: No LVD/LAN, oral mucosa dry, PERL Cardiovascular:  HSR RRR Lungs:  CTA Abdomen:  +  bs, rt flank tenderness GU: Foley dilute urine Musculoskeletal:  Intact Skin:  Warm and dry  LABS:  CBC  Recent Labs Lab 10/11/14 2042  WBC 8.7  HGB 7.3*  HCT 23.1*  PLT 409*   Coag's  Recent Labs Lab 10/12/14 0500  APTT 35  INR 1.42   BMET  Recent Labs Lab 10/11/14 2042  NA 128*  K 2.9*  CL 92*  CO2 25  BUN 11  CREATININE 1.16*  GLUCOSE 110*   Electrolytes  Recent Labs Lab 10/11/14 2042  CALCIUM  8.1*   Sepsis Markers  Recent Labs Lab 10/11/14 2055 10/12/14 0021 10/12/14 0600  LATICACIDVEN 3.46* 2.80* 1.8   ABG No results for input(s): PHART, PCO2ART, PO2ART in the last 168 hours. Liver Enzymes  Recent Labs Lab 10/11/14 2042  AST 27  ALT 19  ALKPHOS 106  BILITOT 1.1  ALBUMIN 2.5*   Cardiac Enzymes No results for input(s): TROPONINI, PROBNP in the last 168 hours. Glucose No results for input(s): GLUCAP in the last 168 hours.  Imaging Dg Chest Port 1 View  10/11/2014   CLINICAL DATA:  54 year old female with 5 week history of fever. Medical history includes uterine and cervical cancer.  EXAM: PORTABLE CHEST - 1 VIEW  COMPARISON:  None.  FINDINGS: The lungs are clear and negative for focal airspace consolidation, pulmonary edema or suspicious pulmonary nodule. No pleural effusion or pneumothorax. Cardiac and mediastinal contours are within normal limits. No acute fracture or lytic or blastic osseous lesions. The visualized upper abdominal bowel gas pattern is unremarkable.  IMPRESSION: No active cardiopulmonary disease.   Electronically Signed   By: Jacqulynn Cadet M.D.   On: 10/11/2014 21:20     ASSESSMENT / PLAN:    CARDIOVASCULAR CVL A: Shock from sepsis + hemorrhage, from presumed renal source with hx of cervical cancer P:  Continue fluid resuscitation Low dose pressors, wean as able, if unable to wean then place cvl Check procalcitonin  Abx Blood products Check  2 d echo to screen for CM (hx of chemo)  RENAL Lab Results  Component Value Date   CREATININE 1.16* 10/11/2014    Recent Labs Lab 10/11/14 2042  K 2.9*   A: Rt renal process/blockage/possible infection/possible tumor excursion Hypokalemia P:   Urology consult 5/17 Foley placed 5/17 For IR perq drain rt renal 5/17 Consider hem/onc consult  GASTROINTESTINAL A:   No acute issue P:    HEMATOLOGIC A:   Cervical cancer dx 12/15 Completed rtx/chemo 4/16 P:  Tumor resection  in future Records at [facility] in [place]  INFECTIOUS A:   Presumed rt renal abscess P:   BCx2 5/17>> UC 5/17>> Sputum Abx: 5/17 vanco 5/17 >> 5/17 ceftriaxone 5/17 >>  ENDOCRINE A:  No acute issue P:    NEUROLOGIC A:  No acute issue, awake and alert P:   RASS goal: 0 Follow   PULMONARY A: No acute issue at this time P:     FAMILY  - Updates: Husband updated at bedside 5/17 0700  - Inter-disciplinary family meet or Palliative Care meeting due by:  day 7    TODAY'S SUMMARY:  54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 Plan to have surgery once tumor has shrunk. She presents to [facility] ED 5/16 2000 hrs with chief complaint of fever up to 102 over last 2 weeks. [facility] instructed her to use antipyretics. She further developed intense rt flank pain without nausea  or vomiting but positive for chills and sweats. In [facility] ED CT abd revealed blood right renal calyces, urinoma, Rt ureteral obstruction, possible tumor excursion, along with rtx changes.Treated in ED with IV fluids but had persistent hypotension that required pressor support. She is awake and alert, sbp 114 on 5 mcg of levo, making urine, therefore continue IVF, wean pressors, broad spectrum abx, admit to ICU. She is to have perq drain placed by IR, urology has seen her and placed foley. PCCM will admit and she may be able to go to floor within 24 hours.  Richardson Landry Minor ACNP Maryanna Shape PCCM Pager [phone] till 3 pm If no answer page [phone] 10/12/2014, 7:44 AM  Attending Note:  I have examined patient, reviewed labs, studies and notes. I have discussed the case with S Minor, and I agree with the data and plans as amended above. Admitted with shock in setting R peri-nephric fluid collection, ? Urinoma + blood vs abscess. Suspect septic shock + hemorrhagic shock. She is stabilizing on low dose norepi. Planning for R peri-nephric  fluid drainage this am. We will give blood to resuscitate for goal Hgb > 8.0 given presumed active bleeding.  Independent critical care time is 50 minutes.   Baltazar Apo, MD, PhD 10/12/2014, 10:12 AM [facility] [phone] or if no answer [phone]

## 2014-10-12 NOTE — Procedures (Signed)
Successful RT PERINEPHRIC FLUID COLLECTION DRAIN INSERTION NO COMP STABLE GS/CX SENT 635C BROWN THIN FLUID REMOVED

## 2014-10-12 NOTE — Progress Notes (Addendum)
eLink Physician-Brief Progress Note Patient Name: Stacy Webster DOB: 1960/08/19 MRN: [id number]   Date of Service  10/12/2014  HPI/Events of Note  Patient c/o increased SOB s/p transfusion of 2 units PRBC today. Also has IV fluid running at 200 mL/hour.  eICU Interventions  Will order: 1. Albuterol 2.5 mg via neb now and Q 2 hours PRN. 2. Decrease IV fluid to 125 mL/hour. 3. Lasix 20 mg IV now.      Intervention Category Intermediate Interventions: Respiratory distress - evaluation and management  [name] 10/12/2014, 7:14 PM

## 2014-10-12 NOTE — Consult Note (Signed)
Urology Consult   Physician requesting consult: Noemi Chapel, MD  Reason for consult: Right hydronephrosis  History of Present Illness: Stacy Webster is a 54 y.o. with a history of cervical cancer status post chemoradiation who presents with right sided lower abdominal pain, fevers and decreased appetite/lethargy. These symptoms began a few weeks ago and she has been feeling very tired with intermittent fevers at home.   She presented to the emergency room this evening, where she was found to have a fever of 324 with systolic blood pressure in the 90s and heart rate as high as 118. Urinalysis remarkable for nitrite negative, leukoesterase moderate, 7-10 white blood cells and rare bacteria. White blood cell count equal to 8.7 and creatinine equal to 1.16. CT scan was performed which illustrated a right sided large subcapsular fluid collection with associated right sided fullness of the renal pelvis as well as ureter down to the level of large pelvic mass lesion and bladder. Also concern for high density material in the right sided renal collecting system.  No blood was seen on urinalysis.  In the emergency room, she received fluid boluses and blood pressure and heart rate improved.  She denies a history of voiding or storage urinary symptoms, hematuria.  Past Medical History  Diagnosis Date  . Cancer     uterine and cervical    Past Surgical History  Procedure Laterality Date  . Cholecystectomy    . Tonsillectomy       Current Hospital Medications:  Home meds:    Medication List    ASK your doctor about these medications        acetaminophen 500 MG tablet  Commonly known as:  TYLENOL  Take 500 mg by mouth every 6 (six) hours as needed.     ADVAIR DISKUS 250-50 MCG/DOSE Aepb  Generic drug:  Fluticasone-Salmeterol  Inhale 1 puff into the lungs daily.     ibuprofen 200 MG tablet  Commonly known as:  ADVIL,MOTRIN  Take 600 mg by mouth every 6 (six) hours as needed for fever  (fever).     LORazepam 1 MG tablet  Commonly known as:  ATIVAN  Take 1 mg by mouth every 6 (six) hours as needed (nausea).     ondansetron 8 MG disintegrating tablet  Commonly known as:  ZOFRAN-ODT  Take 8 mg by mouth every 8 (eight) hours as needed for nausea or vomiting.        Scheduled Meds:  Continuous Infusions: . cefTRIAXone (ROCEPHIN)  IV    . potassium chloride 10 mEq (10/12/14 0258)   PRN Meds:.acetaminophen  Allergies:  Allergies  Allergen Reactions  . Codeine Nausea Only  . Hydrocodone Nausea And Vomiting    No family history on file.  Social History:  reports that she has never smoked. She does not have any smokeless tobacco history on file. She reports that she does not drink alcohol or use illicit drugs.  ROS: A complete review of systems was performed.  All systems are negative except for pertinent findings as noted.  Physical Exam:  Vital signs in last 24 hours: Temp:  [103 F (39.4 C)] 103 F (39.4 C) (05/16 2004) Pulse Rate:  [92-118] 93 (05/17 0300) Resp:  [15-27] 27 (05/17 0300) BP: (87-111)/(37-63) 88/50 mmHg (05/17 0300) SpO2:  [93 %-100 %] 99 % (05/17 0300) Constitutional:  Alert and oriented, No acute distress Cardiovascular: Regular rate and rhythm, No JVD Respiratory: Normal respiratory effort, Lungs clear bilaterally GI: Abdomen is soft, nontender, nondistended,  mild right lower quadrant tenderness to palpation GU: No CVA tenderness Lymphatic: No lymphadenopathy Neurologic: Grossly intact, no focal deficits Psychiatric: Normal mood and affect  Laboratory Data:   Recent Labs  10/11/14 2042  WBC 8.7  HGB 7.3*  HCT 23.1*  PLT 409*     Recent Labs  10/11/14 2042  NA 128*  K 2.9*  CL 92*  GLUCOSE 110*  BUN 11  CALCIUM 8.1*  CREATININE 1.16*     Results for orders placed or performed during the hospital encounter of 10/11/14 (from the past 24 hour(s))  CBC WITH DIFFERENTIAL     Status: Abnormal   Collection Time:  10/11/14  8:42 PM  Result Value Ref Range   WBC 8.7 4.0 - 10.5 K/uL   RBC 2.45 (L) 3.87 - 5.11 MIL/uL   Hemoglobin 7.3 (L) 12.0 - 15.0 g/dL   HCT 23.1 (L) 36.0 - 46.0 %   MCV 94.3 78.0 - 100.0 fL   MCH 29.8 26.0 - 34.0 pg   MCHC 31.6 30.0 - 36.0 g/dL   RDW 20.3 (H) 11.5 - 15.5 %   Platelets 409 (H) 150 - 400 K/uL   Neutrophils Relative % 98 (H) 43 - 77 %   Neutro Abs 8.5 (H) 1.7 - 7.7 K/uL   Lymphocytes Relative 1 (L) 12 - 46 %   Lymphs Abs 0.1 (L) 0.7 - 4.0 K/uL   Monocytes Relative 1 (L) 3 - 12 %   Monocytes Absolute 0.1 0.1 - 1.0 K/uL   Eosinophils Relative 0 0 - 5 %   Eosinophils Absolute 0.0 0.0 - 0.7 K/uL   Basophils Relative 0 0 - 1 %   Basophils Absolute 0.0 0.0 - 0.1 K/uL  Comprehensive metabolic panel     Status: Abnormal   Collection Time: 10/11/14  8:42 PM  Result Value Ref Range   Sodium 128 (L) 135 - 145 mmol/L   Potassium 2.9 (L) 3.5 - 5.1 mmol/L   Chloride 92 (L) 101 - 111 mmol/L   CO2 25 22 - 32 mmol/L   Glucose, Bld 110 (H) 65 - 99 mg/dL   BUN 11 6 - 20 mg/dL   Creatinine, Ser 1.16 (H) 0.44 - 1.00 mg/dL   Calcium 8.1 (L) 8.9 - 10.3 mg/dL   Total Protein 7.8 6.5 - 8.1 g/dL   Albumin 2.5 (L) 3.5 - 5.0 g/dL   AST 27 15 - 41 U/L   ALT 19 14 - 54 U/L   Alkaline Phosphatase 106 38 - 126 U/L   Total Bilirubin 1.1 0.3 - 1.2 mg/dL   GFR calc non Af Amer 53 (L) >60 mL/min   GFR calc Af Amer >60 >60 mL/min   Anion gap 11 5 - 15  I-Stat CG4 Lactic Acid, ED     Status: Abnormal   Collection Time: 10/11/14  8:55 PM  Result Value Ref Range   Lactic Acid, Venous 3.46 (HH) 0.5 - 2.0 mmol/L   Comment NOTIFIED PHYSICIAN   Urinalysis with microscopic     Status: Abnormal   Collection Time: 10/11/14 10:14 PM  Result Value Ref Range   Color, Urine AMBER (A) YELLOW   APPearance CLOUDY (A) CLEAR   Specific Gravity, Urine 1.017 1.005 - 1.030   pH 6.0 5.0 - 8.0   Glucose, UA NEGATIVE NEGATIVE mg/dL   Hgb urine dipstick NEGATIVE NEGATIVE   Bilirubin Urine NEGATIVE  NEGATIVE   Ketones, ur NEGATIVE NEGATIVE mg/dL   Protein, ur 30 (A) NEGATIVE mg/dL  Urobilinogen, UA 1.0 0.0 - 1.0 mg/dL   Nitrite NEGATIVE NEGATIVE   Leukocytes, UA MODERATE (A) NEGATIVE  Urine microscopic-add on     Status: None   Collection Time: 10/11/14 10:14 PM  Result Value Ref Range   Squamous Epithelial / LPF RARE RARE   WBC, UA 7-10 <3 WBC/hpf   Bacteria, UA RARE RARE  I-Stat CG4 Lactic Acid, ED  (not at [facility])     Status: Abnormal   Collection Time: 10/12/14 12:21 AM  Result Value Ref Range   Lactic Acid, Venous 2.80 (HH) 0.5 - 2.0 mmol/L   Comment NOTIFIED PHYSICIAN    No results found for this or any previous visit (from the past 240 hour(s)).  Renal Function:  Recent Labs  10/11/14 2042  CREATININE 1.16*   CrCl cannot be calculated (Unknown ideal weight.).  Radiologic Imaging: Ct Abdomen Pelvis W Contrast  10/12/2014   CLINICAL DATA:  Subacute onset of fever for 3 weeks. Elevated lactic acid. 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 Initial encounter.  EXAM: CT ABDOMEN AND PELVIS WITH CONTRAST  TECHNIQUE: Multidetector CT imaging of the abdomen and pelvis was performed using the standard protocol following bolus administration of intravenous contrast.  CONTRAST:  11mL OMNIPAQUE IOHEXOL 300 MG/ML  SOLN  COMPARISON:  None.  FINDINGS: The visualized lung bases are clear.  The liver and spleen are unremarkable in appearance. The patient is status post cholecystectomy, with clips noted along the gallbladder fossa. The pancreas and adrenal glands are unremarkable.  A large amount of blood is noted within the right renal calyces and right renal pelvis, and extending along the course of the right ureter. 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 This likely reflects ureteral obstruction, with blood filling the right renal collecting system.  The left kidney is unremarkable in  appearance.  The small bowel is unremarkable in appearance. The stomach is within normal limits. No acute vascular abnormalities are seen.  The appendix is not definitely characterized; there is no evidence of appendicitis. Contrast progresses to the level of the rectum. The colon is unremarkable in appearance.  There is diffuse soft tissue stranding and fluid within the pelvis. This may reflect postradiation change, or possibly acute infection or inflammation. This tracks about the adjacent distal ileum.  The uterus is enlarged, and contains central fluid attenuation measuring 8.8 cm in size. This may reflect the underlying malignancy, or postradiation necrosis. An underlying calcified fibroid is seen on the left. Heterogeneous attenuation is noted extending to the cervix, and there is diffuse soft tissue attenuation extending into the right side of the base of the bladder. This raises concern for tumor infiltration, though postradiation change could have a similar appearance. This likely explains right ureteral obstruction, as described above.  A 4.4 cm cystic focus is noted at the right ovary, likely physiologic in nature.  The bladder is mildly distended. Mild surrounding soft tissue inflammation may reflect postradiation change or possibly cystitis. No inguinal lymphadenopathy is seen.  No acute osseous abnormalities are identified. There is chronic loss of height involving the superior endplate of L3.  IMPRESSION: 1. Large amount of blood noted within the right renal calyces and right renal pelvis, extending along the course of the right ureter. 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 This likely reflects ureteral obstruction, with blood filling the right renal collecting system. 2. Right ureteral  obstruction appears to reflect diffuse soft tissue attenuation extending into the right side of the base of bladder. This raises concern for tumor infiltration,  though postradiation change could have a similar appearance. 3. Enlarged uterus, containing central fluid attenuation, measuring 8.8 cm. This may reflect the underlying malignancy, or postradiation necrosis. Heterogeneous attenuation extends to the cervix. 4. Diffuse soft tissue inflammation and fluid within the pelvis. This may reflect postradiation change, or possibly acute infection or inflammation. This tracks about the adjacent distal ileum. 5. Mild soft tissue inflammation about the bladder may reflect postradiation change or possibly cystitis.  These results were called by telephone at the time of interpretation on 10/12/2014 at 2:01 am to Dr. Linton Flemings, who verbally acknowledged these results.   Electronically Signed   By: Garald Balding M.D.   On: 10/12/2014 02:23   Dg Chest Port 1 View  10/11/2014   CLINICAL DATA:  54 year old female with 5 week history of fever. Medical history includes uterine and cervical cancer.  EXAM: PORTABLE CHEST - 1 VIEW  COMPARISON:  None.  FINDINGS: The lungs are clear and negative for focal airspace consolidation, pulmonary edema or suspicious pulmonary nodule. No pleural effusion or pneumothorax. Cardiac and mediastinal contours are within normal limits. No acute fracture or lytic or blastic osseous lesions. The visualized upper abdominal bowel gas pattern is unremarkable.  IMPRESSION: No active cardiopulmonary disease.   Electronically Signed   By: Jacqulynn Cadet M.D.   On: 10/11/2014 21:20    I independently reviewed the above imaging studies.  Impression/Recommendation: 54 year old with concern for urosepsis and obstruction associated with advanced cervical cancer status post chemotherapy and radiation. It is not clear what the higher density fluid in the right collecting system represents. While this could be blood, it is surprising to see no blood on urinalysis. While it could also represent puss, there is not the typical stranding around the right kidney that  one would see with pyonephrosis. Could also represent residual contrast in the collecting system from recent contrasted CT that hasn't drained since.  Overall, she has improved clinically and is now stable - prefer to defer intervention until we have better understanding of source.  Uterus is also abnormal in appearance - question if this could be source of bacteremia/sepsis  1) Place foley catheter for optimal decompression of urinary system 2) Have discussed case VIR - will place order for drainage of peri-nephric fluid collection 3) Consult [name] for evaluation of uterus - consideration of this as source 4) NPO for likely procedure tomorrow

## 2014-10-12 NOTE — Progress Notes (Signed)
ANTIBIOTIC CONSULT NOTE - INITIAL  Pharmacy Consult for vancomycin Indication: rule out sepsis  Allergies  Allergen Reactions  . Codeine Nausea Only  . Hydrocodone Nausea And Vomiting    Patient Measurements: Height: 5\' 4"  (162.6 cm) Weight: 163 lb (73.936 kg) IBW/kg (Calculated) : 54.7  Vital Signs: Temp: 98 F (36.7 C) (05/17 0834) Temp Source: Oral (05/17 0834) BP: 95/52 mmHg (05/17 0845) Pulse Rate: 87 (05/17 0845) Intake/Output from previous day:   Intake/Output from this shift: Total I/O In: 5000 [I.V.:5000] Out: 1100 [Urine:1100]  Labs:  Recent Labs  10/11/14 2042  WBC 8.7  HGB 7.3*  PLT 409*  CREATININE 1.16*   Estimated Creatinine Clearance: 55.3 mL/min (by C-G formula based on Cr of 1.16). No results for input(s): VANCOTROUGH, VANCOPEAK, VANCORANDOM, GENTTROUGH, GENTPEAK, GENTRANDOM, TOBRATROUGH, TOBRAPEAK, TOBRARND, AMIKACINPEAK, AMIKACINTROU, AMIKACIN in the last 72 hours.   Microbiology: No results found for this or any previous visit (from the past 720 hour(s)).  Medical History: Past Medical History  Diagnosis Date  . Cancer     uterine and cervical    Medications:  Scheduled:  . heparin  5,000 Units Subcutaneous 3 times per day  . pantoprazole (PROTONIX) IV  40 mg Intravenous QHS   Infusions:  . sodium chloride    . cefTRIAXone (ROCEPHIN)  IV    . sodium chloride 1,000 mL (10/12/14 0831)   Assessment: 62 YOF presents with fever and abdominal pain, she is undergoing chemo for uterine and cervical cancer.  Pharmacy asked to dose ceftriaxone and vancomycin for sepsis with UTI.   5/16 >> Rocephin >> 5/17 >> Vancomycin >>  Tmax 103 WBC 8.7 Scr 1.16, CrCl 55  5/16 blood: pending 5/16 urine: pending  Goal of Therapy:  Vancomycin trough level 15-20 mcg/ml  Plan:  1) Vancomycin 1250mg  x 1 then 750mg  q12 2) Continue rocephin 2g IV q24   Adrian Saran, PharmD, BCPS Pager [phone] 10/12/2014 8:56 AM

## 2014-10-12 NOTE — Progress Notes (Signed)
CRITICAL VALUE ALERT  Critical value received:  + blood cultures  Date of notification:  10/12/2014  Time of notification:  2040 pm  Critical value read back: yes  Nurse who received alert:  Dyann Ruddle, RN  MD notified (1st page):  E-link  Time of first page:  2057 pm  MD notified (2nd page):  Time of second page:  Responding MD:   Time MD responded:

## 2014-10-12 NOTE — ED Provider Notes (Addendum)
Care assumed from prior team.  Awaiting imaging and urology input.  Patient with history of cervical change cancer that is been treated with chemotherapy and radiation through no vomiting.  She has had fever and right-sided abdominal pain.    Outpatient CT scan with swelling of right kidney.  Patient meets criteria for sepsis here, has received IV antibiotics.  Her blood pressures have been trending down.  She has received 3 L of fluid.  Urology has seen the patient, plans for IR drainage of perinephric fluid, and urology stent.  They recommend gyn-onc evaluation given appearance of uterus on CT scan.  Hospitalist is not comfortable admitting the patient due to hypotension.  I discussed the case with Dr. Jimmy Footman on call for critical care.  She will send critical care over to see and evaluate the patient.  She recommends additional IV fluids.    After another liter of IV fluids, patient still with blood pressures in the 70s.  She does not currently have a central line.  She is mentating well, no longer tachycardic.  We'll start peripheral levophed, place Foley catheter.  Awaiting critical care evaluation.  Results for orders placed or performed during the hospital encounter of 10/11/14  CBC WITH DIFFERENTIAL  Result Value Ref Range   WBC 8.7 4.0 - 10.5 K/uL   RBC 2.45 (L) 3.87 - 5.11 MIL/uL   Hemoglobin 7.3 (L) 12.0 - 15.0 g/dL   HCT 23.1 (L) 36.0 - 46.0 %   MCV 94.3 78.0 - 100.0 fL   MCH 29.8 26.0 - 34.0 pg   MCHC 31.6 30.0 - 36.0 g/dL   RDW 20.3 (H) 11.5 - 15.5 %   Platelets 409 (H) 150 - 400 K/uL   Neutrophils Relative % 98 (H) 43 - 77 %   Neutro Abs 8.5 (H) 1.7 - 7.7 K/uL   Lymphocytes Relative 1 (L) 12 - 46 %   Lymphs Abs 0.1 (L) 0.7 - 4.0 K/uL   Monocytes Relative 1 (L) 3 - 12 %   Monocytes Absolute 0.1 0.1 - 1.0 K/uL   Eosinophils Relative 0 0 - 5 %   Eosinophils Absolute 0.0 0.0 - 0.7 K/uL   Basophils Relative 0 0 - 1 %   Basophils Absolute 0.0 0.0 - 0.1 K/uL  Comprehensive  metabolic panel  Result Value Ref Range   Sodium 128 (L) 135 - 145 mmol/L   Potassium 2.9 (L) 3.5 - 5.1 mmol/L   Chloride 92 (L) 101 - 111 mmol/L   CO2 25 22 - 32 mmol/L   Glucose, Bld 110 (H) 65 - 99 mg/dL   BUN 11 6 - 20 mg/dL   Creatinine, Ser 1.16 (H) 0.44 - 1.00 mg/dL   Calcium 8.1 (L) 8.9 - 10.3 mg/dL   Total Protein 7.8 6.5 - 8.1 g/dL   Albumin 2.5 (L) 3.5 - 5.0 g/dL   AST 27 15 - 41 U/L   ALT 19 14 - 54 U/L   Alkaline Phosphatase 106 38 - 126 U/L   Total Bilirubin 1.1 0.3 - 1.2 mg/dL   GFR calc non Af Amer 53 (L) >60 mL/min   GFR calc Af Amer >60 >60 mL/min   Anion gap 11 5 - 15  Urinalysis with microscopic  Result Value Ref Range   Color, Urine AMBER (A) YELLOW   APPearance CLOUDY (A) CLEAR   Specific Gravity, Urine 1.017 1.005 - 1.030   pH 6.0 5.0 - 8.0   Glucose, UA NEGATIVE NEGATIVE mg/dL   Hgb  urine dipstick NEGATIVE NEGATIVE   Bilirubin Urine NEGATIVE NEGATIVE   Ketones, ur NEGATIVE NEGATIVE mg/dL   Protein, ur 30 (A) NEGATIVE mg/dL   Urobilinogen, UA 1.0 0.0 - 1.0 mg/dL   Nitrite NEGATIVE NEGATIVE   Leukocytes, UA MODERATE (A) NEGATIVE  Urine microscopic-add on  Result Value Ref Range   Squamous Epithelial / LPF RARE RARE   WBC, UA 7-10 <3 WBC/hpf   Bacteria, UA RARE RARE  Protime-INR  Result Value Ref Range   Prothrombin Time 17.5 (H) 11.6 - 15.2 seconds   INR 1.42 0.00 - 1.49  APTT  Result Value Ref Range   aPTT 35 24 - 37 seconds  I-Stat CG4 Lactic Acid, ED  Result Value Ref Range   Lactic Acid, Venous 3.46 (HH) 0.5 - 2.0 mmol/L   Comment NOTIFIED PHYSICIAN   I-Stat CG4 Lactic Acid, ED  (not at [facility])  Result Value Ref Range   Lactic Acid, Venous 2.80 (HH) 0.5 - 2.0 mmol/L   Comment NOTIFIED PHYSICIAN    Ct Abdomen Pelvis W Contrast  10/12/2014   CLINICAL DATA:  Subacute onset of fever for 3 weeks. Elevated lactic acid. 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 Initial encounter.  EXAM: CT ABDOMEN AND PELVIS  WITH CONTRAST  TECHNIQUE: Multidetector CT imaging of the abdomen and pelvis was performed using the standard protocol following bolus administration of intravenous contrast.  CONTRAST:  144mL OMNIPAQUE IOHEXOL 300 MG/ML  SOLN  COMPARISON:  None.  FINDINGS: The visualized lung bases are clear.  The liver and spleen are unremarkable in appearance. The patient is status post cholecystectomy, with clips noted along the gallbladder fossa. The pancreas and adrenal glands are unremarkable.  A large amount of blood is noted within the right renal calyces and right renal pelvis, and extending along the course of the right ureter. 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 This likely reflects ureteral obstruction, with blood filling the right renal collecting system.  The left kidney is unremarkable in appearance.  The small bowel is unremarkable in appearance. The stomach is within normal limits. No acute vascular abnormalities are seen.  The appendix is not definitely characterized; there is no evidence of appendicitis. Contrast progresses to the level of the rectum. The colon is unremarkable in appearance.  There is diffuse soft tissue stranding and fluid within the pelvis. This may reflect postradiation change, or possibly acute infection or inflammation. This tracks about the adjacent distal ileum.  The uterus is enlarged, and contains central fluid attenuation measuring 8.8 cm in size. This may reflect the underlying malignancy, or postradiation necrosis. An underlying calcified fibroid is seen on the left. Heterogeneous attenuation is noted extending to the cervix, and there is diffuse soft tissue attenuation extending into the right side of the base of the bladder. This raises concern for tumor infiltration, though postradiation change could have a similar appearance. This likely explains right ureteral obstruction, as described above.  A 4.4 cm cystic  focus is noted at the right ovary, likely physiologic in nature.  The bladder is mildly distended. Mild surrounding soft tissue inflammation may reflect postradiation change or possibly cystitis. No inguinal lymphadenopathy is seen.  No acute osseous abnormalities are identified. There is chronic loss of height involving the superior endplate of L3.  IMPRESSION: 1. Large amount of blood noted within the right renal calyces and right renal pelvis, extending along the course of the right  ureter. 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 This likely reflects ureteral obstruction, with blood filling the right renal collecting system. 2. Right ureteral obstruction appears to reflect diffuse soft tissue attenuation extending into the right side of the base of bladder. This raises concern for tumor infiltration, though postradiation change could have a similar appearance. 3. Enlarged uterus, containing central fluid attenuation, measuring 8.8 cm. This may reflect the underlying malignancy, or postradiation necrosis. Heterogeneous attenuation extends to the cervix. 4. Diffuse soft tissue inflammation and fluid within the pelvis. This may reflect postradiation change, or possibly acute infection or inflammation. This tracks about the adjacent distal ileum. 5. Mild soft tissue inflammation about the bladder may reflect postradiation change or possibly cystitis.  These results were called by telephone at the time of interpretation on 10/12/2014 at 2:01 am to Dr. Linton Flemings, who verbally acknowledged these results.   Electronically Signed   By: Garald Balding M.D.   On: 10/12/2014 02:23   Dg Chest Port 1 View  10/11/2014   CLINICAL DATA:  54 year old female with 5 week history of fever. Medical history includes uterine and cervical cancer.  EXAM: PORTABLE CHEST - 1 VIEW  COMPARISON:  None.  FINDINGS: The lungs are clear and negative for focal airspace consolidation, pulmonary  edema or suspicious pulmonary nodule. No pleural effusion or pneumothorax. Cardiac and mediastinal contours are within normal limits. No acute fracture or lytic or blastic osseous lesions. The visualized upper abdominal bowel gas pattern is unremarkable.  IMPRESSION: No active cardiopulmonary disease.   Electronically Signed   By: Jacqulynn Cadet M.D.   On: 10/11/2014 21:20      Linton Flemings, MD 10/12/14 [id number]  Linton Flemings, MD 10/12/14 [phone]

## 2014-10-12 NOTE — ED Notes (Signed)
Patient still in radiology. Radiology staff will draw type and screen and send it to lab.

## 2014-10-12 NOTE — Sedation Documentation (Signed)
Pt has critical hemoglobin of 5.0 holding procedure for now per Dr. Annamaria Boots. May perform CT guided drainage of right perinephric fluid collection later today when patient is more stable.

## 2014-10-12 NOTE — Progress Notes (Signed)
Subjective: Patient reports she feel much better after drain placement. S/p right peri-nephric fluid drain.   Objective: Vital signs in last 24 hours: Temp:  [98 F (36.7 C)-103.1 F (39.5 C)] 100.7 F (38.2 C) (05/17 1658) Pulse Rate:  [83-118] 92 (05/17 1644) Resp:  [12-44] 30 (05/17 1644) BP: (71-139)/(37-81) 107/58 mmHg (05/17 1658) SpO2:  [93 %-100 %] 97 % (05/17 1644) Weight:  [73.936 kg (163 lb)-77 kg (169 lb 12.1 oz)] 77 kg (169 lb 12.1 oz) (05/17 1122)  Intake/Output from previous day:   Intake/Output this shift: Total I/O In: 6583.3 [P.O.:30; I.V.:5733.3; Blood:670; IV Piggyback:150] Out: 9379 [Urine:1650]  Physical Exam:  NAD Right drain in place. No further drainage at this time.   Lab Results:  Recent Labs  10/11/14 2042 10/12/14 0920  HGB 7.3* 5.0*  HCT 23.1* 15.5*   BMET  Recent Labs  10/11/14 2042 10/12/14 0920  NA 128*  --   K 2.9*  --   CL 92*  --   CO2 25  --   GLUCOSE 110*  --   BUN 11  --   CREATININE 1.16* 0.98  CALCIUM 8.1*  --     Recent Labs  10/12/14 0500  INR 1.42   No results for input(s): LABURIN in the last 72 hours. Results for orders placed or performed during the hospital encounter of 10/11/14  MRSA PCR Screening     Status: None   Collection Time: 10/12/14 11:12 AM  Result Value Ref Range Status   MRSA by PCR NEGATIVE NEGATIVE Final    Comment:        The GeneXpert MRSA Assay (FDA approved for NASAL specimens only), is one component of a comprehensive MRSA colonization surveillance program. It is not intended to diagnose MRSA infection nor to guide or monitor treatment for MRSA infections.     Studies/Results: Ct Abdomen Pelvis W Contrast  10/12/2014   CLINICAL DATA:  Subacute onset of fever for 3 weeks. Elevated lactic acid. 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 Initial encounter.  EXAM: CT ABDOMEN AND PELVIS WITH CONTRAST  TECHNIQUE: Multidetector CT imaging of  the abdomen and pelvis was performed using the standard protocol following bolus administration of intravenous contrast.  CONTRAST:  112mL OMNIPAQUE IOHEXOL 300 MG/ML  SOLN  COMPARISON:  None.  FINDINGS: The visualized lung bases are clear.  The liver and spleen are unremarkable in appearance. The patient is status post cholecystectomy, with clips noted along the gallbladder fossa. The pancreas and adrenal glands are unremarkable.  A large amount of blood is noted within the right renal calyces and right renal pelvis, and extending along the course of the right ureter. 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 This likely reflects ureteral obstruction, with blood filling the right renal collecting system.  The left kidney is unremarkable in appearance.  The small bowel is unremarkable in appearance. The stomach is within normal limits. No acute vascular abnormalities are seen.  The appendix is not definitely characterized; there is no evidence of appendicitis. Contrast progresses to the level of the rectum. The colon is unremarkable in appearance.  There is diffuse soft tissue stranding and fluid within the pelvis. This may reflect postradiation change, or possibly acute infection or inflammation. This tracks about the adjacent distal ileum.  The uterus is enlarged, and contains central fluid attenuation measuring 8.8 cm in size. This may reflect the underlying malignancy, or postradiation  necrosis. An underlying calcified fibroid is seen on the left. Heterogeneous attenuation is noted extending to the cervix, and there is diffuse soft tissue attenuation extending into the right side of the base of the bladder. This raises concern for tumor infiltration, though postradiation change could have a similar appearance. This likely explains right ureteral obstruction, as described above.  A 4.4 cm cystic focus is noted at the right ovary, likely physiologic in  nature.  The bladder is mildly distended. Mild surrounding soft tissue inflammation may reflect postradiation change or possibly cystitis. No inguinal lymphadenopathy is seen.  No acute osseous abnormalities are identified. There is chronic loss of height involving the superior endplate of L3.  IMPRESSION: 1. Large amount of blood noted within the right renal calyces and right renal pelvis, extending along the course of the right ureter. 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 This likely reflects ureteral obstruction, with blood filling the right renal collecting system. 2. Right ureteral obstruction appears to reflect diffuse soft tissue attenuation extending into the right side of the base of bladder. This raises concern for tumor infiltration, though postradiation change could have a similar appearance. 3. Enlarged uterus, containing central fluid attenuation, measuring 8.8 cm. This may reflect the underlying malignancy, or postradiation necrosis. Heterogeneous attenuation extends to the cervix. 4. Diffuse soft tissue inflammation and fluid within the pelvis. This may reflect postradiation change, or possibly acute infection or inflammation. This tracks about the adjacent distal ileum. 5. Mild soft tissue inflammation about the bladder may reflect postradiation change or possibly cystitis.  These results were called by telephone at the time of interpretation on 10/12/2014 at 2:01 am to Dr. Linton Flemings, who verbally acknowledged these results.   Electronically Signed   By: Garald Balding M.D.   On: 10/12/2014 02:23   Dg Chest Port 1 View  10/11/2014   CLINICAL DATA:  54 year old female with 5 week history of fever. Medical history includes uterine and cervical cancer.  EXAM: PORTABLE CHEST - 1 VIEW  COMPARISON:  None.  FINDINGS: The lungs are clear and negative for focal airspace consolidation, pulmonary edema or suspicious pulmonary nodule. No pleural  effusion or pneumothorax. Cardiac and mediastinal contours are within normal limits. No acute fracture or lytic or blastic osseous lesions. The visualized upper abdominal bowel gas pattern is unremarkable.  IMPRESSION: No active cardiopulmonary disease.   Electronically Signed   By: Jacqulynn Cadet M.D.   On: 10/11/2014 21:20   Ct Image Guided Drainage Percut Cath  Peritoneal Retroperit  10/12/2014   CLINICAL DATA:  Right perinephric fluid collection, fever, bacteremia  EXAM: CT-GUIDED RIGHT PERINEPHRIC FLUID COLLECTION DRAIN INSERTION  Date:  5/17/20165/17/2016 3:10 pm  Radiologist:  M. Daryll Brod, MD  Guidance:  ULTRASOUND AND FLUOROSCOPIC  FLUOROSCOPY TIME:  NONE.  MEDICATIONS AND MEDICAL HISTORY: 1% lidocaine locally, 0.5 mg Versed, 25 mcg fentanyl  ANESTHESIA/SEDATION: 15 minutes  CONTRAST:  None.  COMPLICATIONS: None immediate  PROCEDURE: Informed consent was obtained from the patient following explanation of the procedure, risks, benefits and alternatives. The patient understands, agrees and consents for the procedure. All questions were addressed. A time out was performed.  Maximal barrier sterile technique utilized including caps, mask, sterile gowns, sterile gloves, large sterile drape, hand hygiene, and Betadine.  Previous imaging reviewed. Patient positioned right anterior oblique. Noncontrast CT performed through the abdomen to demonstrate the large right perinephric fluid collection. Under sterile conditions and local anesthesia, an 18 gauge 10 cm  access needle was advanced percutaneously into the collection. Needle position confirmed with CT. Guidewire advanced followed by tract dilatation to insert a 10 [place] drain. Catheter position confirmed with repeat imaging. Syringe aspiration yielded 635 cc dark thin fluid. Sample sent for Gram stain and culture. Catheter secured with a Prolene suture and connected to external suction bulb. Sterile dressing applied. No immediate complication. Patient  tolerated the procedure well.  IMPRESSION: Successful CT-guided right perinephric abscess drain insertion.   Electronically Signed   By: Jerilynn Mages.  Shick M.D.   On: 10/12/2014 16:09    Assessment/Plan: Sepsis, right hydronephrosis, right perinephric fluid collection - s/p drainage of large right PN fluid collection which I drained in the event it was infected and / or putting pressure on the kidney causing some obstruction. I ordered a Renal U/S to check for continued hydronephrosis. I tentatively scheduled patient for cysto, right retrograde pyelogram and right ureteral stent in the AM if hydronephrosis persists. I discussed with the patient and her family the nature, r/b/a to cystoscopy, right RGP and ureteral stent. She can eat now and NPO p MN. She agrees to proceed.    LOS: 0 days   Mathew Postiglione 10/12/2014, 5:12 PM

## 2014-10-12 NOTE — ED Notes (Signed)
Patient transported to interventional radiology 

## 2014-10-12 NOTE — ED Notes (Signed)
I Tried to get blood twice and was unsuccessful, the nurse is stopping fluid and waiting 15 minutes to get blood off the IV site, so that is why it will have to wait, a bit. Thanks

## 2014-10-12 NOTE — ED Notes (Signed)
Patient refused for statlock to be placed on leg to secure foley catheter in place.

## 2014-10-12 NOTE — H&P (Signed)
Chief Complaint: Chief Complaint  Patient presents with  . Fever  . Abdominal Pain    Referring Physician(s): Dr. Festus Aloe,   History of Present Illness: Stacy Webster is a 54 y.o. female with h/o Cervical cancer who recently completed radiation and chemotherapy.  She presented to the ED last night c/o fever of 102 and right flank pain. CT scan revealed large perinephric fluid collection. We are asked to evaluate her for CT guided drainage of Right perinephric fluid collection.  Past Medical History  Diagnosis Date  . Cancer     uterine and cervical    Past Surgical History  Procedure Laterality Date  . Cholecystectomy    . Tonsillectomy      Allergies: Codeine and Hydrocodone  Medications: Prior to Admission medications   Medication Sig Start Date End Date Taking? Authorizing Provider  acetaminophen (TYLENOL) 500 MG tablet Take 500 mg by mouth every 6 (six) hours as needed.   Yes Historical Provider, MD  ADVAIR DISKUS 250-50 MCG/DOSE AEPB Inhale 1 puff into the lungs daily.  10/02/14  Yes Historical Provider, MD  ibuprofen (ADVIL,MOTRIN) 200 MG tablet Take 600 mg by mouth every 6 (six) hours as needed for fever (fever).   Yes Historical Provider, MD  LORazepam (ATIVAN) 1 MG tablet Take 1 mg by mouth every 6 (six) hours as needed (nausea).  09/29/14  Yes Historical Provider, MD  ondansetron (ZOFRAN-ODT) 8 MG disintegrating tablet Take 8 mg by mouth every 8 (eight) hours as needed for nausea or vomiting.  08/11/14   Historical Provider, MD     History reviewed. No pertinent family history.  History   Social History  . Marital Status: Single    Spouse Name: N/A  . Number of Children: N/A  . Years of Education: N/A   Social History Main Topics  . Smoking status: Never Smoker   . Smokeless tobacco: Not on file  . Alcohol Use: No  . Drug Use: No  . Sexual Activity: Not on file   Other Topics Concern  . None   Social History Narrative     Review  of Systems: A 12 point ROS discussed and pertinent positives are indicated in the HPI above.  All other systems are negative.  Review of Systems  Constitutional: Positive for fever. Negative for activity change and appetite change.  Cardiovascular: Negative for chest pain.  Gastrointestinal: Negative for abdominal pain.  Genitourinary: Positive for flank pain. Negative for dysuria, frequency, hematuria and pelvic pain.  Musculoskeletal: Negative for gait problem.  Skin: Negative.   Neurological: Negative for dizziness and syncope.  Psychiatric/Behavioral: Negative.     Vital Signs: BP 95/52 mmHg  Pulse 87  Temp(Src) 98 F (36.7 C) (Oral)  Resp 16  Ht 5\' 4"  (1.626 m)  Wt 163 lb (73.936 kg)  BMI 27.97 kg/m2  SpO2 93%  Physical Exam  Constitutional: She is oriented to person, place, and time. She appears well-developed and well-nourished.  HENT:  Head: Normocephalic and atraumatic.  Eyes: EOM are normal.  Neck: Normal range of motion. Neck supple.  Cardiovascular: Normal rate, regular rhythm and normal heart sounds.   No murmur heard. Pulmonary/Chest: Effort normal and breath sounds normal. She has no wheezes.  Abdominal: Soft. Bowel sounds are normal. She exhibits no distension. There is tenderness.  Moderate tenderness to palpation RUQ and Right flank area.  Musculoskeletal: Normal range of motion.  Neurological: She is alert and oriented to person, place, and time.  Skin:  Skin is warm and dry.  Psychiatric: She has a normal mood and affect. Her behavior is normal. Judgment and thought content normal.    Mallampati Score:  MD Evaluation Airway: WNL Heart: WNL Abdomen: WNL Chest/ Lungs: WNL ASA  Classification: 3 Mallampati/Airway Score: One  Imaging: Ct Abdomen Pelvis W Contrast  10/12/2014   CLINICAL DATA:  Subacute onset of fever for 3 weeks. Elevated lactic acid. 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 Initial encounter.   EXAM: CT ABDOMEN AND PELVIS WITH CONTRAST  TECHNIQUE: Multidetector CT imaging of the abdomen and pelvis was performed using the standard protocol following bolus administration of intravenous contrast.  CONTRAST:  165mL OMNIPAQUE IOHEXOL 300 MG/ML  SOLN  COMPARISON:  None.  FINDINGS: The visualized lung bases are clear.  The liver and spleen are unremarkable in appearance. The patient is status post cholecystectomy, with clips noted along the gallbladder fossa. The pancreas and adrenal glands are unremarkable.  A large amount of blood is noted within the right renal calyces and right renal pelvis, and extending along the course of the right ureter. 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 This likely reflects ureteral obstruction, with blood filling the right renal collecting system.  The left kidney is unremarkable in appearance.  The small bowel is unremarkable in appearance. The stomach is within normal limits. No acute vascular abnormalities are seen.  The appendix is not definitely characterized; there is no evidence of appendicitis. Contrast progresses to the level of the rectum. The colon is unremarkable in appearance.  There is diffuse soft tissue stranding and fluid within the pelvis. This may reflect postradiation change, or possibly acute infection or inflammation. This tracks about the adjacent distal ileum.  The uterus is enlarged, and contains central fluid attenuation measuring 8.8 cm in size. This may reflect the underlying malignancy, or postradiation necrosis. An underlying calcified fibroid is seen on the left. Heterogeneous attenuation is noted extending to the cervix, and there is diffuse soft tissue attenuation extending into the right side of the base of the bladder. This raises concern for tumor infiltration, though postradiation change could have a similar appearance. This likely explains right ureteral obstruction, as described  above.  A 4.4 cm cystic focus is noted at the right ovary, likely physiologic in nature.  The bladder is mildly distended. Mild surrounding soft tissue inflammation may reflect postradiation change or possibly cystitis. No inguinal lymphadenopathy is seen.  No acute osseous abnormalities are identified. There is chronic loss of height involving the superior endplate of L3.  IMPRESSION: 1. Large amount of blood noted within the right renal calyces and right renal pelvis, extending along the course of the right ureter. 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 This likely reflects ureteral obstruction, with blood filling the right renal collecting system. 2. Right ureteral obstruction appears to reflect diffuse soft tissue attenuation extending into the right side of the base of bladder. This raises concern for tumor infiltration, though postradiation change could have a similar appearance. 3. Enlarged uterus, containing central fluid attenuation, measuring 8.8 cm. This may reflect the underlying malignancy, or postradiation necrosis. Heterogeneous attenuation extends to the cervix. 4. Diffuse soft tissue inflammation and fluid within the pelvis. This may reflect postradiation change, or possibly acute infection or inflammation. This tracks about the adjacent distal ileum. 5. Mild soft tissue inflammation about the bladder may reflect postradiation change  or possibly cystitis.  These results were called by telephone at the time of interpretation on 10/12/2014 at 2:01 am to Dr. Linton Flemings, who verbally acknowledged these results.   Electronically Signed   By: Garald Balding M.D.   On: 10/12/2014 02:23   Dg Chest Port 1 View  10/11/2014   CLINICAL DATA:  54 year old female with 5 week history of fever. Medical history includes uterine and cervical cancer.  EXAM: PORTABLE CHEST - 1 VIEW  COMPARISON:  None.  FINDINGS: The lungs are clear and negative for focal airspace  consolidation, pulmonary edema or suspicious pulmonary nodule. No pleural effusion or pneumothorax. Cardiac and mediastinal contours are within normal limits. No acute fracture or lytic or blastic osseous lesions. The visualized upper abdominal bowel gas pattern is unremarkable.  IMPRESSION: No active cardiopulmonary disease.   Electronically Signed   By: Jacqulynn Cadet M.D.   On: 10/11/2014 21:20    Labs:  CBC:  Recent Labs  10/11/14 2042  WBC 8.7  HGB 7.3*  HCT 23.1*  PLT 409*    COAGS:  Recent Labs  10/12/14 0500  INR 1.42  APTT 35    BMP:  Recent Labs  10/11/14 2042  NA 128*  K 2.9*  CL 92*  CO2 25  GLUCOSE 110*  BUN 11  CALCIUM 8.1*  CREATININE 1.16*  GFRNONAA 53*  GFRAA >60    LIVER FUNCTION TESTS:  Recent Labs  10/11/14 2042  BILITOT 1.1  AST 27  ALT 19  ALKPHOS 106  PROT 7.8  ALBUMIN 2.5*    TUMOR MARKERS: No results for input(s): AFPTM, CEA, CA199, CHROMGRNA in the last 8760 hours.  Assessment and Plan:  Large Right perinephric fluid collection For CT Guided drainage of Rt Perinephric fluid collection today by Dr. Annamaria Boots.  Thank you for this interesting consult.  I greatly enjoyed meeting Stacy Webster and look forward to participating in their care.  Risks and Benefits discussed with the patient including bleeding, infection, damage to adjacent structures, bowel perforation/fistula connection, and sepsis. All of the patient's questions were answered, patient is agreeable to proceed. Consent signed and in chart.   SignedNevin Bloodgood 10/12/2014, 9:07 AM   I spent a total of 20 in face to face in clinical consultation, greater than 50% of which was counseling/coordinating care for CT guided drainage of Right perinephric fluid collection.

## 2014-10-13 ENCOUNTER — Inpatient Hospital Stay (HOSPITAL_COMMUNITY): Payer: BC Managed Care – PPO | Admitting: Anesthesiology

## 2014-10-13 ENCOUNTER — Encounter (HOSPITAL_COMMUNITY)
Admission: EM | Disposition: A | Discharge status: Home / Self Care | Payer: Self-pay | Source: Home / Self Care | Attending: Internal Medicine

## 2014-10-13 ENCOUNTER — Inpatient Hospital Stay (HOSPITAL_COMMUNITY): Payer: BC Managed Care – PPO

## 2014-10-13 DIAGNOSIS — R7881 Bacteremia: Secondary | ICD-10-CM | POA: Diagnosis present

## 2014-10-13 DIAGNOSIS — A499 Bacterial infection, unspecified: Secondary | ICD-10-CM

## 2014-10-13 DIAGNOSIS — N1330 Unspecified hydronephrosis: Secondary | ICD-10-CM | POA: Insufficient documentation

## 2014-10-13 DIAGNOSIS — N2889 Other specified disorders of kidney and ureter: Secondary | ICD-10-CM

## 2014-10-13 DIAGNOSIS — G459 Transient cerebral ischemic attack, unspecified: Secondary | ICD-10-CM

## 2014-10-13 DIAGNOSIS — N135 Crossing vessel and stricture of ureter without hydronephrosis: Secondary | ICD-10-CM

## 2014-10-13 HISTORY — PX: CYSTOSCOPY W/ URETERAL STENT PLACEMENT: SHX1429

## 2014-10-13 HISTORY — PX: TRANSTHORACIC ECHOCARDIOGRAM: SHX275

## 2014-10-13 LAB — BASIC METABOLIC PANEL
Anion gap: 11 (ref 5–15)
BUN: 11 mg/dL (ref 6–20)
CHLORIDE: 100 mmol/L — AB (ref 101–111)
CO2: 23 mmol/L (ref 22–32)
Calcium: 7 mg/dL — ABNORMAL LOW (ref 8.9–10.3)
Creatinine, Ser: 1.09 mg/dL — ABNORMAL HIGH (ref 0.44–1.00)
GFR calc Af Amer: >60 mL/min (ref >60)
GFR calc non Af Amer: 57 mL/min — ABNORMAL LOW (ref >60)
GLUCOSE: 94 mg/dL (ref 65–99)
Potassium: 2.8 mmol/L — ABNORMAL LOW (ref 3.5–5.1)
SODIUM: 134 mmol/L — AB (ref 135–145)

## 2014-10-13 LAB — TYPE AND SCREEN
ABO/RH(D): A POS
Antibody Screen: NEGATIVE
UNIT DIVISION: 0
Unit division: 0

## 2014-10-13 LAB — CBC
HCT: 24.7 % — ABNORMAL LOW (ref 36.0–46.0)
HEMATOCRIT: 23 % — AB (ref 36.0–46.0)
Hemoglobin: 7.8 g/dL — ABNORMAL LOW (ref 12.0–15.0)
Hemoglobin: 8.2 g/dL — ABNORMAL LOW (ref 12.0–15.0)
MCH: 29.1 pg (ref 26.0–34.0)
MCH: 29.9 pg (ref 26.0–34.0)
MCHC: 33.2 g/dL (ref 30.0–36.0)
MCHC: 33.9 g/dL (ref 30.0–36.0)
MCV: 87.6 fL (ref 78.0–100.0)
MCV: 88.1 fL (ref 78.0–100.0)
PLATELETS: 261 10*3/uL (ref 150–400)
Platelets: 233 10*3/uL (ref 150–400)
RBC: 2.61 MIL/uL — ABNORMAL LOW (ref 3.87–5.11)
RBC: 2.82 MIL/uL — ABNORMAL LOW (ref 3.87–5.11)
RDW: 20.7 % — ABNORMAL HIGH (ref 11.5–15.5)
RDW: 20.9 % — AB (ref 11.5–15.5)
WBC: 13.7 10*3/uL — ABNORMAL HIGH (ref 4.0–10.5)
WBC: 16.2 10*3/uL — AB (ref 4.0–10.5)

## 2014-10-13 LAB — BLOOD GAS, ARTERIAL
Acid-Base Excess: 0.6 mmol/L (ref 0.0–2.0)
Bicarbonate: 22.9 mEq/L (ref 20.0–24.0)
Drawn by: 422461
FIO2: 0.21 %
O2 Saturation: 96.1 %
PATIENT TEMPERATURE: 98.9
PH ART: 7.511 — AB (ref 7.350–7.450)
TCO2: 21.4 mmol/L (ref 0–100)
pCO2 arterial: 28.8 mmHg — ABNORMAL LOW (ref 35.0–45.0)
pO2, Arterial: 79.7 mmHg — ABNORMAL LOW (ref 80.0–100.0)

## 2014-10-13 LAB — CREATININE, FLUID (PLEURAL, PERITONEAL, JP DRAINAGE): Creat, Fluid: 1.3 mg/dL

## 2014-10-13 LAB — LEGIONELLA ANTIGEN, URINE

## 2014-10-13 SURGERY — CYSTOSCOPY, WITH RETROGRADE PYELOGRAM AND URETERAL STENT INSERTION
Anesthesia: General | Site: Ureter | Laterality: Right

## 2014-10-13 MED ORDER — PANTOPRAZOLE SODIUM 40 MG PO TBEC
40.0000 mg | DELAYED_RELEASE_TABLET | Freq: Every day | ORAL | Status: DC
  Administered 2014-10-13 – 2014-10-18 (×6): 40 mg via ORAL
  Filled 2014-10-13 (×6): qty 1

## 2014-10-13 MED ORDER — POTASSIUM CHLORIDE CRYS ER 20 MEQ PO TBCR
40.0000 meq | EXTENDED_RELEASE_TABLET | ORAL | Status: AC
  Administered 2014-10-13 (×2): 40 meq via ORAL
  Filled 2014-10-13 (×2): qty 2

## 2014-10-13 MED ORDER — ONDANSETRON HCL 4 MG/2ML IJ SOLN
INTRAMUSCULAR | Status: DC | PRN
  Administered 2014-10-13: 4 mg via INTRAVENOUS

## 2014-10-13 MED ORDER — IOHEXOL 300 MG/ML  SOLN
INTRAMUSCULAR | Status: DC | PRN
  Administered 2014-10-13: 22 mL via URETHRAL

## 2014-10-13 MED ORDER — MEPERIDINE HCL 50 MG/ML IJ SOLN
6.2500 mg | INTRAMUSCULAR | Status: DC | PRN
Start: 2014-10-13 — End: 2014-10-13

## 2014-10-13 MED ORDER — ALBUTEROL SULFATE (2.5 MG/3ML) 0.083% IN NEBU: 2.5000 mg | INHALATION_SOLUTION | RESPIRATORY_TRACT | Status: DC | PRN

## 2014-10-13 MED ORDER — SODIUM CHLORIDE 0.9 % IR SOLN
Status: DC | PRN
  Administered 2014-10-13: 3000 mL

## 2014-10-13 MED ORDER — LACTATED RINGERS IV SOLN
INTRAVENOUS | Status: DC | PRN
  Administered 2014-10-13: 08:00:00 via INTRAVENOUS

## 2014-10-13 MED ORDER — 0.9 % SODIUM CHLORIDE (POUR BTL) OPTIME
TOPICAL | Status: DC | PRN
  Administered 2014-10-13: 1000 mL

## 2014-10-13 MED ORDER — FENTANYL CITRATE (PF) 100 MCG/2ML IJ SOLN
INTRAMUSCULAR | Status: AC
  Filled 2014-10-13: qty 2

## 2014-10-13 MED ORDER — FENTANYL CITRATE (PF) 100 MCG/2ML IJ SOLN
INTRAMUSCULAR | Status: DC | PRN
  Administered 2014-10-13: 25 ug via INTRAVENOUS
  Administered 2014-10-13: 50 ug via INTRAVENOUS

## 2014-10-13 MED ORDER — DEXAMETHASONE SODIUM PHOSPHATE 10 MG/ML IJ SOLN
INTRAMUSCULAR | Status: DC | PRN
  Administered 2014-10-13: 10 mg via INTRAVENOUS

## 2014-10-13 MED ORDER — MIDAZOLAM HCL 5 MG/5ML IJ SOLN
INTRAMUSCULAR | Status: DC | PRN
  Administered 2014-10-13: 2 mg via INTRAVENOUS

## 2014-10-13 MED ORDER — PROPOFOL 10 MG/ML IV BOLUS
INTRAVENOUS | Status: AC
  Filled 2014-10-13: qty 20

## 2014-10-13 MED ORDER — PROMETHAZINE HCL 25 MG/ML IJ SOLN: 6.2500 mg | INTRAMUSCULAR | Status: DC | PRN

## 2014-10-13 MED ORDER — PROPOFOL 10 MG/ML IV BOLUS
INTRAVENOUS | Status: DC | PRN
  Administered 2014-10-13: 150 mg via INTRAVENOUS

## 2014-10-13 MED ORDER — HYDROMORPHONE HCL 1 MG/ML IJ SOLN: 0.2500 mg | INTRAMUSCULAR | Status: DC | PRN

## 2014-10-13 MED ORDER — MIDAZOLAM HCL 2 MG/2ML IJ SOLN
INTRAMUSCULAR | Status: AC
  Filled 2014-10-13: qty 2

## 2014-10-13 MED ORDER — METOCLOPRAMIDE HCL 5 MG/ML IJ SOLN
INTRAMUSCULAR | Status: DC | PRN
  Administered 2014-10-13: 10 mg via INTRAVENOUS

## 2014-10-13 MED ORDER — LACTATED RINGERS IV SOLN: INTRAVENOUS | Status: DC

## 2014-10-13 SURGICAL SUPPLY — 17 items
BAG URINE DRAINAGE (UROLOGICAL SUPPLIES) ×3 IMPLANT
CATH URET 5FR 28IN CONE TIP (BALLOONS) ×2
CATH URET 5FR 28IN OPEN ENDED (CATHETERS) ×3 IMPLANT
CATH URET 5FR 70CM CONE TIP (BALLOONS) ×1 IMPLANT
CATH URET DUAL LUMEN 6-10FR 50 (CATHETERS) ×3 IMPLANT
GLOVE BIO SURGEON STRL SZ7.5 (GLOVE) ×3 IMPLANT
GOWN STRL REUS W/ TWL XL LVL3 (GOWN DISPOSABLE) ×3 IMPLANT
GOWN STRL REUS W/TWL XL LVL3 (GOWN DISPOSABLE) ×6
GUIDEWIRE ANG ZIPWIRE 035X150 (WIRE) ×3 IMPLANT
GUIDEWIRE ANG ZIPWIRE 038X150 (WIRE) ×3 IMPLANT
GUIDEWIRE STR DUAL SENSOR (WIRE) ×3 IMPLANT
PACK CYSTO (CUSTOM PROCEDURE TRAY) ×3 IMPLANT
STENT CONTOUR 6FRX26X.038 (STENTS) ×3 IMPLANT
STENT URET 6FRX24 CONTOUR (STENTS) ×3 IMPLANT
TRAY FOLEY METER SIL LF 16FR (CATHETERS) ×3 IMPLANT
TUBING CONNECTING 10 (TUBING) ×2 IMPLANT
TUBING CONNECTING 10' (TUBING) ×1

## 2014-10-13 NOTE — Anesthesia Procedure Notes (Signed)
Procedure Name: LMA Insertion Date/Time: 10/13/2014 8:39 AM Performed by: Johnathan Hausen A Pre-anesthesia Checklist: Patient identified, Emergency Drugs available, Suction available, Patient being monitored and Timeout performed Patient Re-evaluated:Patient Re-evaluated prior to inductionOxygen Delivery Method: Circle system utilized Preoxygenation: Pre-oxygenation with 100% oxygen Intubation Type: Combination inhalational/ intravenous induction Ventilation: Mask ventilation without difficulty LMA: LMA with gastric port inserted LMA Size: 5.0 Grade View: Grade I Tube type: Oral Number of attempts: 1 Placement Confirmation: positive ETCO2 and breath sounds checked- equal and bilateral Tube secured with: Tape Dental Injury: Teeth and Oropharynx as per pre-operative assessment

## 2014-10-13 NOTE — Op Note (Signed)
Preoperative diagnosis: Right hydronephrosis, acute renal failure, sepsis Postoperative diagnosis: Right distal ureteral stricture, right hydronephrosis, acute renal failure, sepsis  Procedure: Cystoscopy, dilation of right distal ureteral stricture, right ureteral stent placement  Surgeon: Amrutha Avera  Type of anesthesia: Gen.  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 She presented with sepsis right hydronephrosis and right perinephric fluid collection. Right perinephric fluid collection was drained and she continued on hydronephrosis. She was brought today for cystoscopy and retrograde pyelogram, stent placement.  Findings: On exam under anesthesia the external vaginal area appeared normal without lesion. The meatus appeared normal. On bimanual exam the vagina was shortened and strictured proximally. There are no palpable masses but typical induration proximally one might expect after radiation. The area was smooth.  On cystoscopy the urethra was normal. 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 There were no obvious tumors. The trigone and ureteral orifices were in their normal orthotopic position. There were no stones or foreign bodies in the bladder. The bladder mucosa otherwise appeared normal.  Right retrograde pyelogram-this outlined a 2-3 cm distal ureteral stricture just above the ureterovesical junction. Proximal to this the remainder of the distal ureter was mildly dilated the mid and proximal ureter mildly dilated, the proximal ureter was some tortuosity in the collecting system dilated. There were no other strictures or filling defects. There was a single ureter single collecting system unit.   Of note there was still contrast in the colon from her prior imaging. There was no apparent leak from the  collecting system to the drain. There was some contrast overlying the drain which was contained in the colon on scout imaging. I did send JP for creatinine to rule out a urine leak.  Disruption of procedure: After consent was obtained patient brought to the operating room. After adequate anesthesia she is placed in lithotomy position and prepped and draped in the usual sterile fashion. A timeout was called to confirm the patient and procedure. Cystoscope was passed per urethra after an exam under anesthesia. The bladder was examined in its entirety. The right ureteral orifice was cannulated with a cone-tipped catheter and retrograde injection of contrast was performed. I then tried doing gauge the right ureter with a 0.38 sensor wire and a 6 [place] open-ended catheter. The wire would not engage and we switched to a [place]. This also would not engage. I went into a 5 [place] open-ended catheter and a 0.035 Glidewire and we were able to get the wire proximally into the collecting system. The 5 [place] open-ended catheter was passed to the proximal ureter and the wire removed. There was a brisk hydronephrotic drip. A second retrograde confirm dilation of the proximal ureter and collecting system confirming placement of wire in the lumen. The 038 wire was repassed and coiled in the upper pole collecting system the distal ureteral stricture was sequentially dilated from 5 [place], 6 [place] and 10 [place] without difficulty. A 6 x 26 cm stent was then placed within the wire was removed. A good coil seen in the renal pelvis and a good coil in the bladder. There was good efflux of fluid from the stent.   Complications: None  Blood loss: Minimal   Specimens: JP drain for creatinine sent to lab  Drains: 6 x 26 cm right ureteral stent  Disposition: Patient stable to PACU

## 2014-10-13 NOTE — Progress Notes (Signed)
  Echocardiogram 2D Echocardiogram has been performed.  Stacy Webster FRANCES 10/13/2014, 1:59 PM

## 2014-10-13 NOTE — Progress Notes (Signed)
  Pt without complaints. At baseline she has some frequency and urgency. She has no abd pain. Discussed renal U/S results and reviewed images which show continued moderate right hydronephrosis.   Filed Vitals:   10/13/14 0700  BP: 125/60  Pulse: 93  Temp:   Resp: 26    Intake/Output Summary (Last 24 hours) at 10/13/14 0825 Last data filed at 10/13/14 0557  Gross per 24 hour  Intake 8923.33 ml  Output   4240 ml  Net 4683.33 ml    PE: NAD Abd - soft, NT. No tenderness over uterus to suggest infection.     A - sepsis, fever, right hydronephrosis, ARF -  P- I discussed with the patient the nature, potential benefits, risks and alternatives to cystoscopy, right retrograde pyelogram and right ureteral stent placement, including side effects of the proposed treatment, the likelihood of the patient achieving the goals of the procedure, and any potential problems that might occur during the procedure or recuperation. Discussed post-op stent pain and potential failure to get RG access. Discussed alternatives such as no further intervention or right Nephrostomy tubes. All questions answered. Patient elects to proceed.

## 2014-10-13 NOTE — Progress Notes (Signed)
Patient ID: Stacy Webster, female   DOB: November 28, 1960, 54 y.o.   MRN: [id number]    Referring Physician(s): [place]   Subjective: Pt feeling much better today; had rt ureteral stent placed earlier today; denies sig flank pain, N/V  Allergies: Codeine and Hydrocodone  Medications: Prior to Admission medications   Medication Sig Start Date End Date Taking? Authorizing Provider  acetaminophen (TYLENOL) 500 MG tablet Take 500 mg by mouth every 6 (six) hours as needed.   Yes Historical Provider, MD  ADVAIR DISKUS 250-50 MCG/DOSE AEPB Inhale 1 puff into the lungs daily.  10/02/14  Yes Historical Provider, MD  ibuprofen (ADVIL,MOTRIN) 200 MG tablet Take 600 mg by mouth every 6 (six) hours as needed for fever (fever).   Yes Historical Provider, MD  LORazepam (ATIVAN) 1 MG tablet Take 1 mg by mouth every 6 (six) hours as needed (nausea).  09/29/14  Yes Historical Provider, MD  ondansetron (ZOFRAN-ODT) 8 MG disintegrating tablet Take 8 mg by mouth every 8 (eight) hours as needed for nausea or vomiting.  08/11/14   Historical Provider, MD     Vital Signs: BP 127/66 mmHg  Pulse 88  Temp(Src) 98.5 F (36.9 C) (Oral)  Resp 26  Ht 5\' 4"  (1.626 m)  Wt 169 lb 12.1 oz (77 kg)  BMI 29.12 kg/m2  SpO2 99%  Physical Exam pt awake/alert; rt perinephric drain intact, insertion site ok, minimal tenderness; small amount blood tinged fluid in bulb; cx's pend  Imaging: Ct Abdomen Pelvis W Contrast  10/12/2014   CLINICAL DATA:  Subacute onset of fever for 3 weeks. Elevated lactic acid. 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 Initial encounter.  EXAM: CT ABDOMEN AND PELVIS WITH CONTRAST  TECHNIQUE: Multidetector CT imaging of the abdomen and pelvis was performed using the standard protocol following bolus administration of intravenous contrast.  CONTRAST:  181mL OMNIPAQUE IOHEXOL 300 MG/ML  SOLN  COMPARISON:  None.  FINDINGS: The visualized lung bases are clear.  The liver  and spleen are unremarkable in appearance. The patient is status post cholecystectomy, with clips noted along the gallbladder fossa. The pancreas and adrenal glands are unremarkable.  A large amount of blood is noted within the right renal calyces and right renal pelvis, and extending along the course of the right ureter. 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 This likely reflects ureteral obstruction, with blood filling the right renal collecting system.  The left kidney is unremarkable in appearance.  The small bowel is unremarkable in appearance. The stomach is within normal limits. No acute vascular abnormalities are seen.  The appendix is not definitely characterized; there is no evidence of appendicitis. Contrast progresses to the level of the rectum. The colon is unremarkable in appearance.  There is diffuse soft tissue stranding and fluid within the pelvis. This may reflect postradiation change, or possibly acute infection or inflammation. This tracks about the adjacent distal ileum.  The uterus is enlarged, and contains central fluid attenuation measuring 8.8 cm in size. This may reflect the underlying malignancy, or postradiation necrosis. An underlying calcified fibroid is seen on the left. Heterogeneous attenuation is noted extending to the cervix, and there is diffuse soft tissue attenuation extending into the right side of the base of the bladder. This raises concern for tumor infiltration, though postradiation change could have a similar appearance. This likely explains right ureteral obstruction, as described above.  A 4.4 cm cystic  focus is noted at the right ovary, likely physiologic in nature.  The bladder is mildly distended. Mild surrounding soft tissue inflammation may reflect postradiation change or possibly cystitis. No inguinal lymphadenopathy is seen.  No acute osseous abnormalities are identified. There is chronic loss of  height involving the superior endplate of L3.  IMPRESSION: 1. Large amount of blood noted within the right renal calyces and right renal pelvis, extending along the course of the right ureter. 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 This likely reflects ureteral obstruction, with blood filling the right renal collecting system. 2. Right ureteral obstruction appears to reflect diffuse soft tissue attenuation extending into the right side of the base of bladder. This raises concern for tumor infiltration, though postradiation change could have a similar appearance. 3. Enlarged uterus, containing central fluid attenuation, measuring 8.8 cm. This may reflect the underlying malignancy, or postradiation necrosis. Heterogeneous attenuation extends to the cervix. 4. Diffuse soft tissue inflammation and fluid within the pelvis. This may reflect postradiation change, or possibly acute infection or inflammation. This tracks about the adjacent distal ileum. 5. Mild soft tissue inflammation about the bladder may reflect postradiation change or possibly cystitis.  These results were called by telephone at the time of interpretation on 10/12/2014 at 2:01 am to Dr. Linton Flemings, who verbally acknowledged these results.   Electronically Signed   By: Garald Balding M.D.   On: 10/12/2014 02:23   US Renal  10/12/2014   CLINICAL DATA:  Acute onset of right-sided flank pain for 2 days. Fever. Status post CT-guided drainage of right-sided perinephric fluid. Subsequent encounter.  EXAM: RENAL / URINARY TRACT ULTRASOUND COMPLETE  COMPARISON:  CT of the abdomen and pelvis performed earlier today at 1:22 a.m.  FINDINGS: Right Kidney:  Length: 13.6 cm. The previously noted large right-sided perinephric fluid collection has resolved. The associated drainage catheter is noted adjacent to the right kidney. There is underlying persistent severe right-sided hydronephrosis. The nature of the fluid  within the right renal collecting system is not well assessed on ultrasound.  Left Kidney:  Length: 14.1 cm. Echogenicity within normal limits. No mass or hydronephrosis visualized.  Bladder:  Decompressed, with a Foley catheter in place.  The uterus is enlarged, as noted on prior CT, measuring 12.3 x 8.9 x 9.4 cm, and fluid echogenicity is noted filling the endometrial echo complex, measuring 7.3 x 6.4 cm.  IMPRESSION: 1. Interval resolution of previously noted large right-sided perinephric fluid collection. Associated drainage catheter now seen adjacent to the right kidney. 2. Underlying persistent severe right-sided hydronephrosis. The nature of the fluid within the right renal collecting system is not well assessed on ultrasound. 3. Enlarged uterus again noted, with fluid echogenicity filling the endometrial echo complex. As noted on CT, this could reflect postradiation necrosis, underlying malignancy or possibly obstruction at the level of the cervix.   Electronically Signed   By: Garald Balding M.D.   On: 10/12/2014 19:35   Dg Chest Port 1 View  10/11/2014   CLINICAL DATA:  54 year old female with 5 week history of fever. Medical history includes uterine and cervical cancer.  EXAM: PORTABLE CHEST - 1 VIEW  COMPARISON:  None.  FINDINGS: The lungs are clear and negative for focal airspace consolidation, pulmonary edema or suspicious pulmonary nodule. No pleural effusion or pneumothorax. Cardiac and mediastinal contours are within normal limits. No acute fracture or lytic or blastic osseous lesions. The visualized upper abdominal bowel gas pattern is  unremarkable.  IMPRESSION: No active cardiopulmonary disease.   Electronically Signed   By: Jacqulynn Cadet M.D.   On: 10/11/2014 21:20   Ct Image Guided Drainage Percut Cath  Peritoneal Retroperit  10/12/2014   CLINICAL DATA:  Right perinephric fluid collection, fever, bacteremia  EXAM: CT-GUIDED RIGHT PERINEPHRIC FLUID COLLECTION DRAIN INSERTION  Date:   5/17/20165/17/2016 3:10 pm  Radiologist:  M. Daryll Brod, MD  Guidance:  ULTRASOUND AND FLUOROSCOPIC  FLUOROSCOPY TIME:  NONE.  MEDICATIONS AND MEDICAL HISTORY: 1% lidocaine locally, 0.5 mg Versed, 25 mcg fentanyl  ANESTHESIA/SEDATION: 15 minutes  CONTRAST:  None.  COMPLICATIONS: None immediate  PROCEDURE: Informed consent was obtained from the patient following explanation of the procedure, risks, benefits and alternatives. The patient understands, agrees and consents for the procedure. All questions were addressed. A time out was performed.  Maximal barrier sterile technique utilized including caps, mask, sterile gowns, sterile gloves, large sterile drape, hand hygiene, and Betadine.  Previous imaging reviewed. Patient positioned right anterior oblique. Noncontrast CT performed through the abdomen to demonstrate the large right perinephric fluid collection. Under sterile conditions and local anesthesia, an 18 gauge 10 cm access needle was advanced percutaneously into the collection. Needle position confirmed with CT. Guidewire advanced followed by tract dilatation to insert a 10 [place] drain. Catheter position confirmed with repeat imaging. Syringe aspiration yielded 635 cc dark thin fluid. Sample sent for Gram stain and culture. Catheter secured with a Prolene suture and connected to external suction bulb. Sterile dressing applied. No immediate complication. Patient tolerated the procedure well.  IMPRESSION: Successful CT-guided right perinephric abscess drain insertion.   Electronically Signed   By: Jerilynn Mages.  Shick M.D.   On: 10/12/2014 16:09    Labs:  CBC:  Recent Labs  10/11/14 2042 10/12/14 0920 10/13/14 0020 10/13/14 0643  WBC 8.7 16.9* 16.2* 13.7*  HGB 7.3* 5.0* 7.8* 8.2*  HCT 23.1* 15.5* 23.0* 24.7*  PLT 409* 286 233 261    COAGS:  Recent Labs  10/12/14 0500  INR 1.42  APTT 35    BMP:  Recent Labs  10/11/14 2042 10/12/14 0920 10/13/14 0643  NA 128*  --  134*  K 2.9*  --  2.8*   CL 92*  --  100*  CO2 25  --  23  GLUCOSE 110*  --  94  BUN 11  --  11  CALCIUM 8.1*  --  7.0*  CREATININE 1.16* 0.98 1.09*  GFRNONAA 53* >60 57*  GFRAA >60 >60 >60    LIVER FUNCTION TESTS:  Recent Labs  10/11/14 2042  BILITOT 1.1  AST 27  ALT 19  ALKPHOS 106  PROT 7.8  ALBUMIN 2.5*    Assessment and Plan: S/p right perinephric fluid collection drainage 5/17; pt afebrile; WBC 13.7(16.2); hgb 8.2;creat 1.09; fluid cx's pend; fluid creat 1.3; cont drain for now; other plans as outlined by urology/CCM   Signed: D. Rowe Robert 10/13/2014, 1:59 PM   I spent a total of 15 minutes  in face to face in clinical consultation/evaluation, greater than 50% of which was counseling/coordinating care for rt perinephric drain

## 2014-10-13 NOTE — Progress Notes (Signed)
   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  I placed a stent earlier today.  She is doing well.  She has no flank pain.  Continue right ureteral stent, Foley catheter and right Retroperitoneal drain.I sent retroperitoneal drain for creatinine and we'll plan to get this drain out when drainage is minimal.  I'll leave Foley for a couple of days to Max drain the system.

## 2014-10-13 NOTE — Transfer of Care (Signed)
Immediate Anesthesia Transfer of Care Note  Patient: Stacy Webster  Procedure(s) Performed: Procedure(s): CYSTOSCOPY WITH RETROGRADE PYELOGRAM/URETERAL STENT PLACEMENT (Right)  Patient Location: PACU  Anesthesia Type:General  Level of Consciousness: awake, sedated and patient cooperative  Airway & Oxygen Therapy: Patient Spontanous Breathing and Patient connected to face mask oxygen  Post-op Assessment: Report given to RN and Post -op Vital signs reviewed and stable  Post vital signs: Reviewed and stable  Last Vitals:  Filed Vitals:   10/13/14 0700  BP: 125/60  Pulse: 93  Temp:   Resp: 26    Complications: No apparent anesthesia complications

## 2014-10-13 NOTE — Anesthesia Preprocedure Evaluation (Signed)
Anesthesia Evaluation  Patient identified by MRN, date of birth, ID band Patient awake    Reviewed: Allergy & Precautions, NPO status , Patient's Chart, lab work & pertinent test results  Airway Mallampati: II  TM Distance: >3 FB Neck ROM: Full    Dental no notable dental hx.    Pulmonary neg pulmonary ROS,  breath sounds clear to auscultation  Pulmonary exam normal       Cardiovascular negative cardio ROS Normal cardiovascular examRhythm:Regular Rate:Normal     Neuro/Psych negative neurological ROS  negative psych ROS   GI/Hepatic negative GI ROS, Neg liver ROS,   Endo/Other  negative endocrine ROS  Renal/GU Renal disease  negative genitourinary   Musculoskeletal negative musculoskeletal ROS (+)   Abdominal   Peds negative pediatric ROS (+)  Hematology negative hematology ROS (+)   Anesthesia Other Findings   Reproductive/Obstetrics negative OB ROS                             Anesthesia Physical Anesthesia Plan  ASA: II  Anesthesia Plan: General   Post-op Pain Management:    Induction: Intravenous  Airway Management Planned: LMA  Additional Equipment:   Intra-op Plan:   Post-operative Plan: Extubation in OR  Informed Consent: I have reviewed the patients History and Physical, chart, labs and discussed the procedure including the risks, benefits and alternatives for the proposed anesthesia with the patient or authorized representative who has indicated his/her understanding and acceptance.   Dental advisory given  Plan Discussed with: CRNA  Anesthesia Plan Comments:         Anesthesia Quick Evaluation

## 2014-10-13 NOTE — Progress Notes (Signed)
PULMONARY / CRITICAL CARE MEDICINE   Name: Stacy Webster MRN: [id number] DOB: [birth year]    ADMISSION DATE:  10/11/2014   REFERRING MD :  EDP  CHIEF COMPLAINT:  Rt flank pain  INITIAL PRESENTATION: 54 yo woman with cervical CA just completed neo-adjuvant chemo/XRT at [facility]. Presented w R flank pain and fever, found to have large R perinephric fluid collection and suspected ureteral obstruction. Developed shock in ED despite IVF. Evaluated by Urology and IR.   STUDIES:  5/17 ct abd as noted 5/17 renal US after fluid drainage >> continued hydronephrosis  SIGNIFICANT EVENTS: Perc drainage of R perinephric fluid collection 5/17  HISTORY OF PRESENT ILLNESS:   54 yo, married, school teacher, never smoker, dx with cervical cancer 12/15, completed rtx/chemo 4/16([facility]). Plan to have surgery once tumor has shrunk. She presents to [facility] ED 5/16 2000 hrs with chief complaint of fever up to 102 over last 2 weeks. [facility] instructed her to use antipyretics. She further developed intense rt flank pain without nausea or vomiting but positive for chills and sweats. In [facility] ED CT abd revealed blood right renal calyces, urinoma, Rt ureteral obstruction, possible tumor excursion, along with rtx changes.Treated in ED with IV fluids but had persistent hypotension that required pressor support. She is awake and alert, sbp 114 on 5 mcg of levo, making urine, therefore continue IVF, wean pressors, broad spectrum abx, admit to ICU. She is to have perq drain placed by IR, urology has seen her and placed foley.  INTERVAL EVENTS:  She underwent R peri-nephric fluid drainage 10/12/14 Went for cystoscopy and R ureteral stent placement 5/18 Feeling better  VITAL SIGNS: Temp:  [98.7 F (37.1 C)-103.1 F (39.5 C)] 99 F (37.2 C) (05/18 1057) Pulse Rate:  [83-108] 84 (05/18 1057) Resp:  [19-44] 22 (05/18 1057) BP: (101-139)/(48-65) 119/63 mmHg (05/18 1057) SpO2:  [96 %-100 %] 99 % (05/18  1057) HEMODYNAMICS:   VENTILATOR SETTINGS:   INTAKE / OUTPUT:  Intake/Output Summary (Last 24 hours) at 10/13/14 1127 Last data filed at 10/13/14 1045  Gross per 24 hour  Intake 4723.33 ml  Output   3191 ml  Net 1532.33 ml    PHYSICAL EXAMINATION: General:  Well-developed, NAD Neuro:  Oriented x3, non-focal HEENT: No LVD/LAN, oral mucosa dry, PERL Cardiovascular:  HSR RRR Lungs:  CTA Abdomen:  + bs, rt flank tenderness GU: Foley dilute urine Musculoskeletal:  Intact Skin:  Warm and dry  LABS:  CBC  Recent Labs Lab 10/12/14 0920 10/13/14 0020 10/13/14 0643  WBC 16.9* 16.2* 13.7*  HGB 5.0* 7.8* 8.2*  HCT 15.5* 23.0* 24.7*  PLT 286 233 261   Coag's  Recent Labs Lab 10/12/14 0500  APTT 35  INR 1.42   BMET  Recent Labs Lab 10/11/14 2042 10/12/14 0920 10/13/14 0643  NA 128*  --  134*  K 2.9*  --  2.8*  CL 92*  --  100*  CO2 25  --  23  BUN 11  --  11  CREATININE 1.16* 0.98 1.09*  GLUCOSE 110*  --  94   Electrolytes  Recent Labs Lab 10/11/14 2042 10/12/14 0920 10/13/14 0643  CALCIUM 8.1*  --  7.0*  MG  --  1.4*  --   PHOS  --  1.8*  --    Sepsis Markers  Recent Labs Lab 10/12/14 0021 10/12/14 0600 10/12/14 0920  LATICACIDVEN 2.80* 1.8 1.7  PROCALCITON  --   --  21.61   ABG  Recent  Labs Lab 10/13/14 0426  PHART 7.511*  PCO2ART 28.8*  PO2ART 79.7*   Liver Enzymes  Recent Labs Lab 10/11/14 2042  AST 27  ALT 19  ALKPHOS 106  BILITOT 1.1  ALBUMIN 2.5*   Cardiac Enzymes No results for input(s): TROPONINI, PROBNP in the last 168 hours. Glucose No results for input(s): GLUCAP in the last 168 hours.  Imaging Ct Abdomen Pelvis W Contrast  10/12/2014   CLINICAL DATA:  Subacute onset of fever for 3 weeks. Elevated lactic acid. 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 Initial encounter.  EXAM: CT ABDOMEN AND PELVIS WITH CONTRAST  TECHNIQUE: Multidetector CT imaging of the abdomen and  pelvis was performed using the standard protocol following bolus administration of intravenous contrast.  CONTRAST:  169mL OMNIPAQUE IOHEXOL 300 MG/ML  SOLN  COMPARISON:  None.  FINDINGS: The visualized lung bases are clear.  The liver and spleen are unremarkable in appearance. The patient is status post cholecystectomy, with clips noted along the gallbladder fossa. The pancreas and adrenal glands are unremarkable.  A large amount of blood is noted within the right renal calyces and right renal pelvis, and extending along the course of the right ureter. 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 This likely reflects ureteral obstruction, with blood filling the right renal collecting system.  The left kidney is unremarkable in appearance.  The small bowel is unremarkable in appearance. The stomach is within normal limits. No acute vascular abnormalities are seen.  The appendix is not definitely characterized; there is no evidence of appendicitis. Contrast progresses to the level of the rectum. The colon is unremarkable in appearance.  There is diffuse soft tissue stranding and fluid within the pelvis. This may reflect postradiation change, or possibly acute infection or inflammation. This tracks about the adjacent distal ileum.  The uterus is enlarged, and contains central fluid attenuation measuring 8.8 cm in size. This may reflect the underlying malignancy, or postradiation necrosis. An underlying calcified fibroid is seen on the left. Heterogeneous attenuation is noted extending to the cervix, and there is diffuse soft tissue attenuation extending into the right side of the base of the bladder. This raises concern for tumor infiltration, though postradiation change could have a similar appearance. This likely explains right ureteral obstruction, as described above.  A 4.4 cm cystic focus is noted at the right ovary, likely physiologic in nature.  The  bladder is mildly distended. Mild surrounding soft tissue inflammation may reflect postradiation change or possibly cystitis. No inguinal lymphadenopathy is seen.  No acute osseous abnormalities are identified. There is chronic loss of height involving the superior endplate of L3.  IMPRESSION: 1. Large amount of blood noted within the right renal calyces and right renal pelvis, extending along the course of the right ureter. 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 This likely reflects ureteral obstruction, with blood filling the right renal collecting system. 2. Right ureteral obstruction appears to reflect diffuse soft tissue attenuation extending into the right side of the base of bladder. This raises concern for tumor infiltration, though postradiation change could have a similar appearance. 3. Enlarged uterus, containing central fluid attenuation, measuring 8.8 cm. This may reflect the underlying malignancy, or postradiation necrosis. Heterogeneous attenuation extends to the cervix. 4. Diffuse soft tissue inflammation and fluid within the pelvis. This may reflect postradiation change, or possibly acute infection or inflammation. This tracks  about the adjacent distal ileum. 5. Mild soft tissue inflammation about the bladder may reflect postradiation change or possibly cystitis.  These results were called by telephone at the time of interpretation on 10/12/2014 at 2:01 am to Dr. Linton Flemings, who verbally acknowledged these results.   Electronically Signed   By: Garald Balding M.D.   On: 10/12/2014 02:23   US Renal  10/12/2014   CLINICAL DATA:  Acute onset of right-sided flank pain for 2 days. Fever. Status post CT-guided drainage of right-sided perinephric fluid. Subsequent encounter.  EXAM: RENAL / URINARY TRACT ULTRASOUND COMPLETE  COMPARISON:  CT of the abdomen and pelvis performed earlier today at 1:22 a.m.  FINDINGS: Right Kidney:  Length: 13.6 cm. The  previously noted large right-sided perinephric fluid collection has resolved. The associated drainage catheter is noted adjacent to the right kidney. There is underlying persistent severe right-sided hydronephrosis. The nature of the fluid within the right renal collecting system is not well assessed on ultrasound.  Left Kidney:  Length: 14.1 cm. Echogenicity within normal limits. No mass or hydronephrosis visualized.  Bladder:  Decompressed, with a Foley catheter in place.  The uterus is enlarged, as noted on prior CT, measuring 12.3 x 8.9 x 9.4 cm, and fluid echogenicity is noted filling the endometrial echo complex, measuring 7.3 x 6.4 cm.  IMPRESSION: 1. Interval resolution of previously noted large right-sided perinephric fluid collection. Associated drainage catheter now seen adjacent to the right kidney. 2. Underlying persistent severe right-sided hydronephrosis. The nature of the fluid within the right renal collecting system is not well assessed on ultrasound. 3. Enlarged uterus again noted, with fluid echogenicity filling the endometrial echo complex. As noted on CT, this could reflect postradiation necrosis, underlying malignancy or possibly obstruction at the level of the cervix.   Electronically Signed   By: Garald Balding M.D.   On: 10/12/2014 19:35   Ct Image Guided Drainage Percut Cath  Peritoneal Retroperit  10/12/2014   CLINICAL DATA:  Right perinephric fluid collection, fever, bacteremia  EXAM: CT-GUIDED RIGHT PERINEPHRIC FLUID COLLECTION DRAIN INSERTION  Date:  5/17/20165/17/2016 3:10 pm  Radiologist:  M. Daryll Brod, MD  Guidance:  ULTRASOUND AND FLUOROSCOPIC  FLUOROSCOPY TIME:  NONE.  MEDICATIONS AND MEDICAL HISTORY: 1% lidocaine locally, 0.5 mg Versed, 25 mcg fentanyl  ANESTHESIA/SEDATION: 15 minutes  CONTRAST:  None.  COMPLICATIONS: None immediate  PROCEDURE: Informed consent was obtained from the patient following explanation of the procedure, risks, benefits and alternatives. The  patient understands, agrees and consents for the procedure. All questions were addressed. A time out was performed.  Maximal barrier sterile technique utilized including caps, mask, sterile gowns, sterile gloves, large sterile drape, hand hygiene, and Betadine.  Previous imaging reviewed. Patient positioned right anterior oblique. Noncontrast CT performed through the abdomen to demonstrate the large right perinephric fluid collection. Under sterile conditions and local anesthesia, an 18 gauge 10 cm access needle was advanced percutaneously into the collection. Needle position confirmed with CT. Guidewire advanced followed by tract dilatation to insert a 10 [place] drain. Catheter position confirmed with repeat imaging. Syringe aspiration yielded 635 cc dark thin fluid. Sample sent for Gram stain and culture. Catheter secured with a Prolene suture and connected to external suction bulb. Sterile dressing applied. No immediate complication. Patient tolerated the procedure well.  IMPRESSION: Successful CT-guided right perinephric abscess drain insertion.   Electronically Signed   By: Jerilynn Mages.  Shick M.D.   On: 10/12/2014 16:09     ASSESSMENT / PLAN:  CARDIOVASCULAR  A: Shock from sepsis + hemorrhage, from presumed renal source with hx of cervical cancer P:  Fluid goal I=O Pressors weaned to off Abx as in ID section TTE pending  RENAL Lab Results  Component Value Date   CREATININE 1.09* 10/13/2014   CREATININE 0.98 10/12/2014   CREATININE 1.16* 10/11/2014    Recent Labs Lab 10/11/14 2042 10/13/14 0643  K 2.9* 2.8*   A: Rt ureteral stricture and obstruction (? Related to XRT) Possible R peri-nephric abscess Hypokalemia P:   Greatly appreciate the assistance of IR and Urology. She is s/p R peri-renal fluid drainage and then cystoscopy with R ureteral stent placement Supplement K  GASTROINTESTINAL A:   Nutrition  P:   Regular Diet  HEMATOLOGIC / ONC A:   Cervical cancer dx  12/15 Completed rtx/chemo 4/16 P:  Tumor resection in future following neo-adjuvant therapy; plan was for her to have PET scan soon, assess response to XRT / chemo Records at [facility] in [place]; she would like to change over to [employer], get a Biomedical scientist. We will help arrange this before d/c home.   INFECTIOUS A:   Possible rt renal abscess GPC bacteremia P:   BCx2 5/17>> GPC chains >>  UC 5/17>> negative  Perinephric fluid 5/17 >>   Abx: 5/17 vanco 5/17 >> 5/17 ceftriaxone 5/17 >>  Abx as above, narrow when GPC speciated She will need TTE and possible ID eval given the GPC bacteremia  ENDOCRINE A:  No acute issue P:    NEUROLOGIC A:  No acute issue, awake and alert P:   RASS goal: 0 Follow   PULMONARY A: No acute issue at this time P:     FAMILY  - Updates: Husband updated at bedside 5/17 and 35  - Inter-disciplinary family meet or Palliative Care meeting due by:  day 7    TODAY'S SUMMARY:  54 yo with cervical cancer 12/15, completed rtx/chemo 4/16 ([facility]). Admitted with R hydronephrosis, R renal urinoma / hematoma, septic shock. Has undergone R ureteral stent, being treated for GPC bacteremia. Improving    Baltazar Apo, MD, PhD 10/13/2014, 11:27 AM [facility] [phone] or if no answer [phone]

## 2014-10-13 NOTE — Anesthesia Postprocedure Evaluation (Signed)
Anesthesia Post Note  Patient: Stacy Webster  Procedure(s) Performed: Procedure(s) (LRB): CYSTOSCOPY WITH RETROGRADE PYELOGRAM/URETERAL STENT PLACEMENT (Right)  Anesthesia type: General  Patient location: PACU  Post pain: Pain level controlled  Post assessment: Post-op Vital signs reviewed  Last Vitals: BP 127/66 mmHg  Pulse 88  Temp(Src) 36.9 C (Oral)  Resp 26  Ht 5\' 4"  (1.626 m)  Wt 169 lb 12.1 oz (77 kg)  BMI 29.12 kg/m2  SpO2 99%  Post vital signs: Reviewed  Level of consciousness: sedated  Complications: No apparent anesthesia complications

## 2014-10-14 ENCOUNTER — Encounter (HOSPITAL_COMMUNITY): Payer: Self-pay | Admitting: Urology

## 2014-10-14 LAB — VANCOMYCIN, TROUGH: Vancomycin Tr: 20 ug/mL (ref 10.0–20.0)

## 2014-10-14 LAB — CULTURE, BLOOD (ROUTINE X 2)

## 2014-10-14 LAB — BASIC METABOLIC PANEL
Anion gap: 10 (ref 5–15)
BUN: 14 mg/dL (ref 6–20)
CALCIUM: 7.6 mg/dL — AB (ref 8.9–10.3)
CO2: 22 mmol/L (ref 22–32)
CREATININE: 0.84 mg/dL (ref 0.44–1.00)
Chloride: 101 mmol/L (ref 101–111)
Glucose, Bld: 116 mg/dL — ABNORMAL HIGH (ref 65–99)
Potassium: 3 mmol/L — ABNORMAL LOW (ref 3.5–5.1)
Sodium: 133 mmol/L — ABNORMAL LOW (ref 135–145)

## 2014-10-14 MED ORDER — SODIUM CHLORIDE 0.9 % IV SOLN
INTRAVENOUS | Status: DC
  Administered 2014-10-15: 03:00:00 via INTRAVENOUS

## 2014-10-14 NOTE — Progress Notes (Signed)
ANTIBIOTIC CONSULT NOTE   Pharmacy Consult for Vancomycin Indication: rule out sepsis  Allergies  Allergen Reactions  . Codeine Nausea Only  . Hydrocodone Nausea And Vomiting   Patient Measurements: Height: 5\' 4"  (162.6 cm) Weight: 171 lb 15.3 oz (78 kg) IBW/kg (Calculated) : 54.7  Vital Signs: Temp: 97.6 F (36.4 C) (05/19 0800) Temp Source: Oral (05/19 0800) BP: 119/59 mmHg (05/19 0800) Pulse Rate: 74 (05/19 0800) Intake/Output from previous day: 05/18 0701 - 05/19 0700 In: 2610.4 [P.O.:120; I.V.:2135.4; IV Piggyback:350] Out: 1081 [Urine:1075; Drains:6] Intake/Output from this shift: Total I/O In: 150 [I.V.:150] Out: -   Labs:  Recent Labs  10/12/14 0920 10/13/14 0020 10/13/14 0643 10/14/14 1021  WBC 16.9* 16.2* 13.7*  --   HGB 5.0* 7.8* 8.2*  --   PLT 286 233 261  --   CREATININE 0.98  --  1.09* 0.84   Estimated Creatinine Clearance: 78.3 mL/min (by C-G formula based on Cr of 0.84).  Recent Labs  10/14/14 1021  [place] 20    Microbiology: Recent Results (from the past 720 hour(s))  Culture, blood (routine x 2)     Status: None   Collection Time: 10/11/14  8:30 PM  Result Value Ref Range Status   Specimen Description BLOOD RAC  Final   Special Requests BOTTLES DRAWN AEROBIC AND ANAEROBIC 5CC  Final   Culture   Final    PEPTOSTREPTOCOCCUS SPECIES Note: Gram Stain Report Called to,Read Back By and Verified With: JANELL GARLAND @2040  10/12/14 Performed at [facility]    Report Status 10/14/2014 FINAL  Final  Culture, blood (routine x 2)     Status: None   Collection Time: 10/11/14  8:35 PM  Result Value Ref Range Status   Specimen Description BLOOD RWRIST  Final   Special Requests BOTTLES DRAWN AEROBIC AND ANAEROBIC 5CC  Final   Culture   Final    PEPTOSTREPTOCOCCUS SPECIES Note: Gram Stain Report Called to,Read Back By and Verified With:  JANELL GARLAND @2040  10/12/14 Performed at [facility]    Report Status 10/14/2014  FINAL  Final  Urine culture     Status: None   Collection Time: 10/11/14 10:15 PM  Result Value Ref Range Status   Specimen Description URINE, CLEAN CATCH  Final   Special Requests NONE  Final   Colony Count   Final    30,000 COLONIES/ML Performed at [facility]    Culture   Final    Multiple bacterial morphotypes present, none predominant. Suggest appropriate recollection if clinically indicated. Performed at [facility]    Report Status 10/12/2014 FINAL  Final  MRSA PCR Screening     Status: None   Collection Time: 10/12/14 11:12 AM  Result Value Ref Range Status   MRSA by PCR NEGATIVE NEGATIVE Final    Comment:        The GeneXpert MRSA Assay (FDA approved for NASAL specimens only), is one component of a comprehensive MRSA colonization surveillance program. It is not intended to diagnose MRSA infection nor to guide or monitor treatment for MRSA infections.   Culture, routine-abscess     Status: None (Preliminary result)   Collection Time: 10/12/14  3:44 PM  Result Value Ref Range Status   Specimen Description ABSCESS  Final   Special Requests Normal  Final   Gram Stain   Final    RARE WBC PRESENT, PREDOMINANTLY PMN NO SQUAMOUS EPITHELIAL CELLS SEEN NO ORGANISMS SEEN Performed at [facility]  Culture   Final    NO GROWTH 1 DAY Performed at [facility]    Report Status PENDING  Incomplete  Anaerobic culture     Status: None (Preliminary result)   Collection Time: 10/12/14  3:44 PM  Result Value Ref Range Status   Specimen Description ABSCESS  Final   Special Requests Normal  Final   Gram Stain   Final    RARE WBC PRESENT, PREDOMINANTLY PMN NO SQUAMOUS EPITHELIAL CELLS SEEN NO ORGANISMS SEEN Performed at [facility]    Culture   Final    NO ANAEROBES ISOLATED; CULTURE IN PROGRESS FOR 5 DAYS Performed at [facility]    Report Status PENDING  Incomplete   Medical History: Past Medical History   Diagnosis Date  . Cancer     uterine and cervical    Medications:  Scheduled:  . cefTRIAXone (ROCEPHIN)  IV  2 g Intravenous Q24H  . pantoprazole  40 mg Oral Daily  . vancomycin  750 mg Intravenous Q12H   Assessment: 28 YOF presents with fever and abdominal pain, she is undergoing chemo for uterine and cervical cancer.  Pharmacy asked to dose ceftriaxone and vancomycin for sepsis with UTI. Large perinephric fluid collection drained 5/17, Cysto and R stent placed 5/18.  5/16 >> Rocephin >> 5/17 >> Vancomycin >>  Tmax 103 > 99.9 WBC reduced to 13.7K Scr 1.09, CrCl ~ 68N  Vancomycin trough 20 mcg/ml on 750mg  q12  5/16 blood: Peptostreptococcus 5/16 urine: pending 5/17 abscess: pending  Goal of Therapy:  Vancomycin trough level 15-20 mcg/ml  Plan:  Discontinue Vancomycin per CCM Continue rocephin 2g IV q24  Minda Ditto PharmD Pager [phone] 10/14/2014, 11:30 AM

## 2014-10-14 NOTE — Progress Notes (Signed)
Patient transferring to 3W, patient to travel by wheelchair.  Report called to [place], Therapist, sports.  Will continue to monitor.

## 2014-10-14 NOTE — Progress Notes (Addendum)
PULMONARY / CRITICAL CARE MEDICINE   Name: Stacy Webster MRN: [id number] DOB: 1961-02-24    ADMISSION DATE:  10/11/2014   REFERRING MD :  EDP  CHIEF COMPLAINT:  Rt flank pain  INITIAL PRESENTATION: 54 yo woman with cervical CA just completed neo-adjuvant chemo/XRT at [facility]. Presented w R flank pain and fever, found to have large R perinephric fluid collection and suspected ureteral obstruction. Developed shock in ED despite IVF. Evaluated by Urology and IR.   STUDIES:  5/17 ct abd as noted 5/17 renal US after fluid drainage >> continued hydronephrosis TTE 5/18 >> normal LV and RV fxn, normal valves  SIGNIFICANT EVENTS: Perc drainage of R perinephric fluid collection 5/17 Cystoscopy and R ureteral stent placement 5/18  HISTORY OF PRESENT ILLNESS:   54 yo, married, Education officer, museum, never smoker, dx with cervical cancer 12/15, completed rtx/chemo 4/16([facility] ,[place]). Plan to have surgery once tumor has shrunk. She presents to [facility] ED 5/16 2000 hrs with chief complaint of fever up to 102 over last 2 weeks. [facility] instructed her to use antipyretics. She further developed intense rt flank pain without nausea or vomiting but positive for chills and sweats. In [facility] ED CT abd revealed blood right renal calyces, urinoma, Rt ureteral obstruction, possible tumor excursion, along with rtx changes.Treated in ED with IV fluids but had persistent hypotension that required pressor support. She is awake and alert, sbp 114 on 5 mcg of levo, making urine, therefore continue IVF, wean pressors, broad spectrum abx, admit to ICU. She is to have perq drain placed by IR, urology has seen her and placed foley.  INTERVAL EVENTS:  GPC not yet speciated Doing well   VITAL SIGNS: Temp:  [97.6 F (36.4 C)-99 F (37.2 C)] 97.6 F (36.4 C) (05/19 0800) Pulse Rate:  [71-110] 71 (05/19 0700) Resp:  [17-26] 18 (05/19 0700) BP: (101-134)/(52-73) 119/63 mmHg (05/19 0400) SpO2:  [95 %-100 %] 95 % (05/19  0700) Weight:  [78 kg (171 lb 15.3 oz)] 78 kg (171 lb 15.3 oz) (05/19 0301) HEMODYNAMICS:   VENTILATOR SETTINGS:   INTAKE / OUTPUT:  Intake/Output Summary (Last 24 hours) at 10/14/14 0920 Last data filed at 10/14/14 0700  Gross per 24 hour  Intake 2610.42 ml  Output   1081 ml  Net 1529.42 ml    PHYSICAL EXAMINATION: General:  Well-developed, NAD Neuro:  Oriented x3, non-focal HEENT: No LVD/LAN, oral mucosa dry, PERL Cardiovascular:  HSR RRR Lungs:  CTA Abdomen:  + bs, rt flank tenderness GU: Foley dilute urine Musculoskeletal:  Intact Skin:  Warm and dry  LABS:  CBC  Recent Labs Lab 10/12/14 0920 10/13/14 0020 10/13/14 0643  WBC 16.9* 16.2* 13.7*  HGB 5.0* 7.8* 8.2*  HCT 15.5* 23.0* 24.7*  PLT 286 233 261   Coag's  Recent Labs Lab 10/12/14 0500  APTT 35  INR 1.42   BMET  Recent Labs Lab 10/11/14 2042 10/12/14 0920 10/13/14 0643  NA 128*  --  134*  K 2.9*  --  2.8*  CL 92*  --  100*  CO2 25  --  23  BUN 11  --  11  CREATININE 1.16* 0.98 1.09*  GLUCOSE 110*  --  94   Electrolytes  Recent Labs Lab 10/11/14 2042 10/12/14 0920 10/13/14 0643  CALCIUM 8.1*  --  7.0*  MG  --  1.4*  --   PHOS  --  1.8*  --    Sepsis Markers  Recent Labs Lab 10/12/14 0021 10/12/14  0600 10/12/14 0920  LATICACIDVEN 2.80* 1.8 1.7  PROCALCITON  --   --  21.61   ABG  Recent Labs Lab 10/13/14 0426  PHART 7.511*  PCO2ART 28.8*  PO2ART 79.7*   Liver Enzymes  Recent Labs Lab 10/11/14 2042  AST 27  ALT 19  ALKPHOS 106  BILITOT 1.1  ALBUMIN 2.5*   Cardiac Enzymes No results for input(s): TROPONINI, PROBNP in the last 168 hours. Glucose No results for input(s): GLUCAP in the last 168 hours.  Imaging No results found.   ASSESSMENT / PLAN:  CARDIOVASCULAR A: Shock from sepsis + hemorrhage, from presumed renal source with hx of cervical cancer P:  Fluid goal I=O Pressors weaned to off Abx as in ID section TTE without evidence for  endocarditis  RENAL Lab Results  Component Value Date   CREATININE 1.09* 10/13/2014   CREATININE 0.98 10/12/2014   CREATININE 1.16* 10/11/2014    Recent Labs Lab 10/11/14 2042 10/13/14 0643  K 2.9* 2.8*   A: Rt ureteral stricture and obstruction (? Related to XRT) Possible R peri-nephric abscess vs urinoma / hematoma Hypokalemia P:   Greatly appreciate the assistance of IR and Urology. She is s/p R peri-renal fluid drainage and then cystoscopy with R ureteral stent placement Recheck K and Supplement as needed  GASTROINTESTINAL A:   Nutrition  Diarrhea, started 5/18 pm P:   Regular Diet Check C diff, if negative then start imodium  HEMATOLOGIC / ONC A:   Cervical cancer dx 12/15 Completed rtx/chemo 4/16 P:  Tumor resection in future following neo-adjuvant therapy; plan was for her to have PET scan soon, assess response to XRT / chemo Records at [facility] in [place] ; she would like to change over to [facility] get a Biomedical scientist. We will help arrange this before d/c home. Dr Junious Silk recommends Dr [name] in [place] A:   Possible rt renal abscess GPC bacteremia P:   BCx2 5/17>> GPC chains >>  UC 5/17>> negative  Perinephric fluid 5/17 >>   Abx: 5/17 vanco 5/17 >> 5/17 ceftriaxone 5/17 >>  Abx as above, narrow when GPC speciated No current evidence for endocarditis  ENDOCRINE A:  No acute issue P:    NEUROLOGIC A:  No acute issue, awake and alert P:   RASS goal: 0 Follow   PULMONARY A: No acute issue at this time P:     FAMILY  - Updates: Husband updated at bedside 5/17 and 18  - Inter-disciplinary family meet or Palliative Care meeting due by:  day 7  Will transfer to floor bed 5/19 and to triad as of 5/20.    TODAY'S SUMMARY:  54 yo with cervical cancer 12/15, completed rtx/chemo 4/16 ([facility]). Admitted with R hydronephrosis, R renal urinoma / hematoma, septic shock. Has undergone R  ureteral stent, being treated for GPC bacteremia. Improving    Baltazar Apo, MD, PhD 10/14/2014, 9:20 AM [facility] [phone] or if no answer [phone]

## 2014-10-14 NOTE — Progress Notes (Signed)
Patient ID: DANINE HOR, female   DOB: 1960/11/06, 54 y.o.   MRN: [id number]  Pt has some bilateral groin pain after the procedure. Now resolved.   Filed Vitals:   10/14/14 0800  BP: 119/59  Pulse: 74  Temp: 97.6 F (36.4 C)  Resp: 18    Intake/Output Summary (Last 24 hours) at 10/14/14 1040 Last data filed at 10/14/14 1000  Gross per 24 hour  Intake 2060.42 ml  Output   1081 ml  Net 979.42 ml   PE: NAD Abd - soft, NT GU - urine clear   Right RP drain - 5 ml --> creatinine 1.3; Culture negative  Blood cx - gram positives  Urine cx mixed growth    A/p - Right ureteral stricture, right hydronephrosis, right perinephric fluid collection-status post right perinephric drain and right ureteral stent.  Plan to leave Foley catheter for 24 more hours and remove it tomorrow.  If right retroperitoneal drain output remains scant will remove it a day or two later. Patient can follow-up in the office in a few weeks to planned stent change on the right and we might consider balloon dilation of right ureteral stricture to open it up further, right ureteroscopy, bladder biopsy, etc.

## 2014-10-14 NOTE — Progress Notes (Signed)
Initial Nutrition Assessment  DOCUMENTATION CODES: Moderate malnutrition related to chronic illness as evidenced by 10% weight loss in <6 months and po intake <75% for >1 month.   INTERVENTION: - Continue to encourage adequate po intake.  - RD will continue to monitor  NUTRITION DIAGNOSIS:  Increased nutrient needs related to cancer and cancer related treatments as evidenced by percent weight loss.  GOAL:  Patient will meet greater than or equal to 90% of their needs  MONITOR:   weight trend, po intake, labs  REASON FOR ASSESSMENT:   Consult for poor po and weight loss  ASSESSMENT: 54 yo woman with cervical CA just completed neo-adjuvant chemo/XRT at [facility]. Presented w R flank pain and fever, found to have large R perinephric fluid collection and suspected ureteral obstruction.  - Pt reports weight loss during cancer treatments. Her usual body weight was ~190 lbs December 2015. She reports that her appetite has improved. Pt ate >75% of lunch today. Denied the need for nutritional supplements at this time. RD will continue to follow for po intake and need for supplements.  - Nutrition-focused physical exam WNL.  - Labs and medications reviewed  Na 133  K 3.0  Ca 7.6  Height:  Ht Readings from Last 1 Encounters:  10/13/14 5\' 4"  (1.626 m)    Weight:  Wt Readings from Last 1 Encounters:  10/14/14 171 lb 15.3 oz (78 kg)    Ideal Body Weight:  54.5 kg  Wt Readings from Last 10 Encounters:  10/14/14 171 lb 15.3 oz (78 kg)    BMI:  Body mass index is 29.5 kg/(m^2).  Estimated Nutritional Needs:  Kcal:  1700-1900  Protein:  100-115 g  Fluid:  1.7-1.9 L/day  Skin:  Reviewed, no issues  Diet Order:  Diet regular Room service appropriate?: Yes; Fluid consistency:: Thin  EDUCATION NEEDS:  Education needs addressed   Intake/Output Summary (Last 24 hours) at 10/14/14 1407 Last data filed at 10/14/14 1000  Gross per 24 hour  Intake 1611.67 ml  Output    1030 ml  Net 581.67 ml    Last BM:  5/19  Laurette Schimke Dixon, [place], [place]

## 2014-10-15 DIAGNOSIS — C539 Malignant neoplasm of cervix uteri, unspecified: Secondary | ICD-10-CM

## 2014-10-15 LAB — BASIC METABOLIC PANEL
Anion gap: 10 (ref 5–15)
BUN: 12 mg/dL (ref 6–20)
CHLORIDE: 101 mmol/L (ref 101–111)
CO2: 22 mmol/L (ref 22–32)
CREATININE: 0.95 mg/dL (ref 0.44–1.00)
Calcium: 7.2 mg/dL — ABNORMAL LOW (ref 8.9–10.3)
GFR calc non Af Amer: >60 mL/min (ref >60)
Glucose, Bld: 90 mg/dL (ref 65–99)
POTASSIUM: 2.8 mmol/L — AB (ref 3.5–5.1)
Sodium: 133 mmol/L — ABNORMAL LOW (ref 135–145)

## 2014-10-15 LAB — CBC
HEMATOCRIT: 24.2 % — AB (ref 36.0–46.0)
Hemoglobin: 8 g/dL — ABNORMAL LOW (ref 12.0–15.0)
MCH: 29.3 pg (ref 26.0–34.0)
MCHC: 33.1 g/dL (ref 30.0–36.0)
MCV: 88.6 fL (ref 78.0–100.0)
Platelets: 283 10*3/uL (ref 150–400)
RBC: 2.73 MIL/uL — ABNORMAL LOW (ref 3.87–5.11)
RDW: 20.8 % — AB (ref 11.5–15.5)
WBC: 10.3 10*3/uL (ref 4.0–10.5)

## 2014-10-15 LAB — CLOSTRIDIUM DIFFICILE BY PCR: Toxigenic C. Difficile by PCR: POSITIVE — AB

## 2014-10-15 MED ORDER — LORAZEPAM 2 MG/ML IJ SOLN: 0.5000 mg | Freq: Four times a day (QID) | INTRAMUSCULAR | Status: DC | PRN

## 2014-10-15 MED ORDER — CEFTRIAXONE SODIUM IN DEXTROSE 20 MG/ML IV SOLN
1.0000 g | INTRAVENOUS | Status: DC
  Administered 2014-10-15 – 2014-10-17 (×3): 1 g via INTRAVENOUS
  Filled 2014-10-15 (×3): qty 50

## 2014-10-15 MED ORDER — HEPARIN SODIUM (PORCINE) 5000 UNIT/ML IJ SOLN
5000.0000 [IU] | Freq: Three times a day (TID) | INTRAMUSCULAR | Status: DC
  Administered 2014-10-15 – 2014-10-18 (×9): 5000 [IU] via SUBCUTANEOUS
  Filled 2014-10-15 (×12): qty 1

## 2014-10-15 MED ORDER — MAGNESIUM SULFATE 2 GM/50ML IV SOLN
2.0000 g | Freq: Once | INTRAVENOUS | Status: AC
  Administered 2014-10-15: 2 g via INTRAVENOUS
  Filled 2014-10-15: qty 50

## 2014-10-15 MED ORDER — POTASSIUM CHLORIDE 10 MEQ/100ML IV SOLN
10.0000 meq | INTRAVENOUS | Status: AC
  Administered 2014-10-15 (×4): 10 meq via INTRAVENOUS
  Filled 2014-10-15 (×4): qty 100

## 2014-10-15 MED ORDER — POTASSIUM CHLORIDE CRYS ER 20 MEQ PO TBCR
40.0000 meq | EXTENDED_RELEASE_TABLET | ORAL | Status: AC
  Administered 2014-10-15 (×2): 40 meq via ORAL
  Filled 2014-10-15 (×2): qty 2

## 2014-10-15 MED ORDER — ONDANSETRON HCL 4 MG/2ML IJ SOLN
4.0000 mg | Freq: Four times a day (QID) | INTRAMUSCULAR | Status: DC | PRN
  Administered 2014-10-15 – 2014-10-17 (×3): 4 mg via INTRAVENOUS
  Filled 2014-10-15 (×3): qty 2

## 2014-10-15 MED ORDER — SODIUM CHLORIDE 0.9 % IV SOLN
3.0000 g | Freq: Four times a day (QID) | INTRAVENOUS | Status: DC
  Administered 2014-10-15: 3 g via INTRAVENOUS
  Filled 2014-10-15 (×3): qty 3

## 2014-10-15 MED ORDER — PROMETHAZINE HCL 25 MG/ML IJ SOLN
25.0000 mg | Freq: Four times a day (QID) | INTRAMUSCULAR | Status: DC | PRN
  Administered 2014-10-15: 25 mg via INTRAVENOUS
  Filled 2014-10-15: qty 1

## 2014-10-15 MED ORDER — METRONIDAZOLE 500 MG PO TABS
500.0000 mg | ORAL_TABLET | Freq: Three times a day (TID) | ORAL | Status: DC
  Administered 2014-10-15 – 2014-10-18 (×9): 500 mg via ORAL
  Filled 2014-10-15 (×12): qty 1

## 2014-10-15 MED ORDER — POTASSIUM CHLORIDE CRYS ER 20 MEQ PO TBCR
40.0000 meq | EXTENDED_RELEASE_TABLET | ORAL | Status: DC
Start: 2014-10-15 — End: 2014-10-15

## 2014-10-15 NOTE — Progress Notes (Signed)
CRITICAL VALUE ALERT  Critical value received:  Positive C-diff  Date of notification:  10/15/14  Time of notification:  1003  Critical value read back:Yes.    Nurse who received alert:  Sandie Ano  MD notified (1st page):  Dr. Ronnie Derby  Time of first page:  37  MD notified (2nd page):  Time of second page:  Responding MD:  Dr. Ronnie Derby  Time MD responded:  [phone]

## 2014-10-15 NOTE — Progress Notes (Signed)
Patient ID: Stacy Webster, female   DOB: 01-03-1961, 54 y.o.   MRN: [id number]     Referring Physician(s): [place]  Subjective:  Stacy Webster is doing ok today.   No new complaints.  Reports Urology is considering removing the tube tomorrow.  She reports minimal drainage in bulb.  Allergies: Codeine and Hydrocodone  Medications: Prior to Admission medications   Medication Sig Start Date End Date Taking? Authorizing Provider  acetaminophen (TYLENOL) 500 MG tablet Take 500 mg by mouth every 6 (six) hours as needed.   Yes Historical Provider, MD  ADVAIR DISKUS 250-50 MCG/DOSE AEPB Inhale 1 puff into the lungs daily.  10/02/14  Yes Historical Provider, MD  ibuprofen (ADVIL,MOTRIN) 200 MG tablet Take 600 mg by mouth every 6 (six) hours as needed for fever (fever).   Yes Historical Provider, MD  LORazepam (ATIVAN) 1 MG tablet Take 1 mg by mouth every 6 (six) hours as needed (nausea).  09/29/14  Yes Historical Provider, MD  ondansetron (ZOFRAN-ODT) 8 MG disintegrating tablet Take 8 mg by mouth every 8 (eight) hours as needed for nausea or vomiting.  08/11/14   Historical Provider, MD     Vital Signs: BP 120/59 mmHg  Pulse 90  Temp(Src) 101.3 F (38.5 C) (Oral)  Resp 18  Ht 5\' 4"  (1.626 m)  Wt 174 lb 12.8 oz (79.289 kg)  BMI 29.99 kg/m2  SpO2 99%  Physical Exam Awake and Alert Drain in place, no erythema.  Output is currently so minimal that nurses have not recorded any. It is blood tinged, fairly clear. Not purulent, no dark blood seen.  Imaging: Ct Abdomen Pelvis W Contrast  10/12/2014   CLINICAL DATA:  Subacute onset of fever for 3 weeks. Elevated lactic acid. 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 Initial encounter.  EXAM: CT ABDOMEN AND PELVIS WITH CONTRAST  TECHNIQUE: Multidetector CT imaging of the abdomen and pelvis was performed using the standard protocol following bolus administration of intravenous contrast.  CONTRAST:  122mL OMNIPAQUE  IOHEXOL 300 MG/ML  SOLN  COMPARISON:  None.  FINDINGS: The visualized lung bases are clear.  The liver and spleen are unremarkable in appearance. The patient is status post cholecystectomy, with clips noted along the gallbladder fossa. The pancreas and adrenal glands are unremarkable.  A large amount of blood is noted within the right renal calyces and right renal pelvis, and extending along the course of the right ureter. 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 This likely reflects ureteral obstruction, with blood filling the right renal collecting system.  The left kidney is unremarkable in appearance.  The small bowel is unremarkable in appearance. The stomach is within normal limits. No acute vascular abnormalities are seen.  The appendix is not definitely characterized; there is no evidence of appendicitis. Contrast progresses to the level of the rectum. The colon is unremarkable in appearance.  There is diffuse soft tissue stranding and fluid within the pelvis. This may reflect postradiation change, or possibly acute infection or inflammation. This tracks about the adjacent distal ileum.  The uterus is enlarged, and contains central fluid attenuation measuring 8.8 cm in size. This may reflect the underlying malignancy, or postradiation necrosis. An underlying calcified fibroid is seen on the left. Heterogeneous attenuation is noted extending to the cervix, and there is diffuse soft tissue attenuation extending into the right side of the base of the bladder. This raises concern for tumor  infiltration, though postradiation change could have a similar appearance. This likely explains right ureteral obstruction, as described above.  A 4.4 cm cystic focus is noted at the right ovary, likely physiologic in nature.  The bladder is mildly distended. Mild surrounding soft tissue inflammation may reflect postradiation change or possibly cystitis. No  inguinal lymphadenopathy is seen.  No acute osseous abnormalities are identified. There is chronic loss of height involving the superior endplate of L3.  IMPRESSION: 1. Large amount of blood noted within the right renal calyces and right renal pelvis, extending along the course of the right ureter. 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 This likely reflects ureteral obstruction, with blood filling the right renal collecting system. 2. Right ureteral obstruction appears to reflect diffuse soft tissue attenuation extending into the right side of the base of bladder. This raises concern for tumor infiltration, though postradiation change could have a similar appearance. 3. Enlarged uterus, containing central fluid attenuation, measuring 8.8 cm. This may reflect the underlying malignancy, or postradiation necrosis. Heterogeneous attenuation extends to the cervix. 4. Diffuse soft tissue inflammation and fluid within the pelvis. This may reflect postradiation change, or possibly acute infection or inflammation. This tracks about the adjacent distal ileum. 5. Mild soft tissue inflammation about the bladder may reflect postradiation change or possibly cystitis.  These results were called by telephone at the time of interpretation on 10/12/2014 at 2:01 am to Dr. Linton Flemings, who verbally acknowledged these results.   Electronically Signed   By: Garald Balding M.D.   On: 10/12/2014 02:23   US Renal  10/12/2014   CLINICAL DATA:  Acute onset of right-sided flank pain for 2 days. Fever. Status post CT-guided drainage of right-sided perinephric fluid. Subsequent encounter.  EXAM: RENAL / URINARY TRACT ULTRASOUND COMPLETE  COMPARISON:  CT of the abdomen and pelvis performed earlier today at 1:22 a.m.  FINDINGS: Right Kidney:  Length: 13.6 cm. The previously noted large right-sided perinephric fluid collection has resolved. The associated drainage catheter is noted adjacent to  the right kidney. There is underlying persistent severe right-sided hydronephrosis. The nature of the fluid within the right renal collecting system is not well assessed on ultrasound.  Left Kidney:  Length: 14.1 cm. Echogenicity within normal limits. No mass or hydronephrosis visualized.  Bladder:  Decompressed, with a Foley catheter in place.  The uterus is enlarged, as noted on prior CT, measuring 12.3 x 8.9 x 9.4 cm, and fluid echogenicity is noted filling the endometrial echo complex, measuring 7.3 x 6.4 cm.  IMPRESSION: 1. Interval resolution of previously noted large right-sided perinephric fluid collection. Associated drainage catheter now seen adjacent to the right kidney. 2. Underlying persistent severe right-sided hydronephrosis. The nature of the fluid within the right renal collecting system is not well assessed on ultrasound. 3. Enlarged uterus again noted, with fluid echogenicity filling the endometrial echo complex. As noted on CT, this could reflect postradiation necrosis, underlying malignancy or possibly obstruction at the level of the cervix.   Electronically Signed   By: Garald Balding M.D.   On: 10/12/2014 19:35   Dg Chest Port 1 View  10/11/2014   CLINICAL DATA:  54 year old female with 5 week history of fever. Medical history includes uterine and cervical cancer.  EXAM: PORTABLE CHEST - 1 VIEW  COMPARISON:  None.  FINDINGS: The lungs are clear and negative for focal airspace consolidation, pulmonary edema or suspicious pulmonary nodule. No pleural effusion or pneumothorax. Cardiac and  mediastinal contours are within normal limits. No acute fracture or lytic or blastic osseous lesions. The visualized upper abdominal bowel gas pattern is unremarkable.  IMPRESSION: No active cardiopulmonary disease.   Electronically Signed   By: Jacqulynn Cadet M.D.   On: 10/11/2014 21:20   Ct Image Guided Drainage Percut Cath  Peritoneal Retroperit  10/12/2014   CLINICAL DATA:  Right perinephric fluid  collection, fever, bacteremia  EXAM: CT-GUIDED RIGHT PERINEPHRIC FLUID COLLECTION DRAIN INSERTION  Date:  5/17/20165/17/2016 3:10 pm  Radiologist:  M. Daryll Brod, MD  Guidance:  ULTRASOUND AND FLUOROSCOPIC  FLUOROSCOPY TIME:  NONE.  MEDICATIONS AND MEDICAL HISTORY: 1% lidocaine locally, 0.5 mg Versed, 25 mcg fentanyl  ANESTHESIA/SEDATION: 15 minutes  CONTRAST:  None.  COMPLICATIONS: None immediate  PROCEDURE: Informed consent was obtained from the patient following explanation of the procedure, risks, benefits and alternatives. The patient understands, agrees and consents for the procedure. All questions were addressed. A time out was performed.  Maximal barrier sterile technique utilized including caps, mask, sterile gowns, sterile gloves, large sterile drape, hand hygiene, and Betadine.  Previous imaging reviewed. Patient positioned right anterior oblique. Noncontrast CT performed through the abdomen to demonstrate the large right perinephric fluid collection. Under sterile conditions and local anesthesia, an 18 gauge 10 cm access needle was advanced percutaneously into the collection. Needle position confirmed with CT. Guidewire advanced followed by tract dilatation to insert a 10 [place] drain. Catheter position confirmed with repeat imaging. Syringe aspiration yielded 635 cc dark thin fluid. Sample sent for Gram stain and culture. Catheter secured with a Prolene suture and connected to external suction bulb. Sterile dressing applied. No immediate complication. Patient tolerated the procedure well.  IMPRESSION: Successful CT-guided right perinephric abscess drain insertion.   Electronically Signed   By: Jerilynn Mages.  Shick M.D.   On: 10/12/2014 16:09    Labs:  CBC:  Recent Labs  10/12/14 0920 10/13/14 0020 10/13/14 0643 10/15/14 0721  WBC 16.9* 16.2* 13.7* 10.3  HGB 5.0* 7.8* 8.2* 8.0*  HCT 15.5* 23.0* 24.7* 24.2*  PLT 286 233 261 283    COAGS:  Recent Labs  10/12/14 0500  INR 1.42  APTT 35     BMP:  Recent Labs  10/11/14 2042 10/12/14 0920 10/13/14 0643 10/14/14 1021 10/15/14 0425  NA 128*  --  134* 133* 133*  K 2.9*  --  2.8* 3.0* 2.8*  CL 92*  --  100* 101 101  CO2 25  --  23 22 22   GLUCOSE 110*  --  94 116* 90  BUN 11  --  11 14 12   CALCIUM 8.1*  --  7.0* 7.6* 7.2*  CREATININE 1.16* 0.98 1.09* 0.84 0.95  GFRNONAA 53* >60 57* >60 >60  GFRAA >60 >60 >60 >60 >60    LIVER FUNCTION TESTS:  Recent Labs  10/11/14 2042  BILITOT 1.1  AST 27  ALT 19  ALKPHOS 106  PROT 7.8  ALBUMIN 2.5*    Assessment and Plan: S/P CT Guided drain placement for perinephric fluid collection Discussed with Dr. Anselm Pancoast Will obtain CT of abdomen tomorrow morning to make sure fluid collection is resolved prior to removing drain.  SignedNevin Bloodgood 10/15/2014, 12:28 PM   I spent a total of 15 minutes in face to face in for f/u care, greater than 50% of which was counseling/coordinating care for CT Guided drain placement.

## 2014-10-15 NOTE — Progress Notes (Signed)
2 Days Post-Op Subjective: Patient reports Some nausea.  Temp of 100.2 last night.  Foley catheter removed this morning.  Objective: Vital signs in last 24 hours: Temp:  [97.6 F (36.4 C)-100.2 F (37.9 C)] 98.8 F (37.1 C) (05/20 0500) Pulse Rate:  [74-90] 90 (05/20 0500) Resp:  [16-18] 18 (05/20 0500) BP: (97-120)/(53-61) 120/59 mmHg (05/20 0500) SpO2:  [98 %-100 %] 99 % (05/20 0500) Weight:  [79.289 kg (174 lb 12.8 oz)] 79.289 kg (174 lb 12.8 oz) (05/20 0500)  Intake/Output from previous day: 05/19 0701 - 05/20 0700 In: 270 [I.V.:270] Out: 850 [Urine:850]  JP - 5 ml  Intake/Output this shift:    Physical Exam:  General: NAD Abd - soft, NT Right retroperitoneal drain-clear serosanguineous fluid.  Minimal output.  Lab Results:  CBC    Component Value Date/Time   WBC 10.3 10/15/2014 0721   RBC 2.73* 10/15/2014 0721   HGB 8.0* 10/15/2014 0721   HCT 24.2* 10/15/2014 0721   PLT 283 10/15/2014 0721   MCV 88.6 10/15/2014 0721   MCH 29.3 10/15/2014 0721   MCHC 33.1 10/15/2014 0721   RDW 20.8* 10/15/2014 0721   LYMPHSABS 0.1* 10/11/2014 2042   MONOABS 0.1 10/11/2014 2042   EOSABS 0.0 10/11/2014 2042   BASOSABS 0.0 10/11/2014 2042      Recent Labs  10/12/14 0920 10/13/14 0020 10/13/14 0643  HGB 5.0* 7.8* 8.2*  HCT 15.5* 23.0* 24.7*   BMET  Recent Labs  10/14/14 1021 10/15/14 0425  NA 133* 133*  K 3.0* 2.8*  CL 101 101  CO2 22 22  GLUCOSE 116* 90  BUN 14 12  CREATININE 0.84 0.95  CALCIUM 7.6* 7.2*   No results for input(s): LABPT, INR in the last 72 hours. No results for input(s): LABURIN in the last 72 hours. Results for orders placed or performed during the hospital encounter of 10/11/14  Culture, blood (routine x 2)     Status: None   Collection Time: 10/11/14  8:30 PM  Result Value Ref Range Status   Specimen Description BLOOD RAC  Final   Special Requests BOTTLES DRAWN AEROBIC AND ANAEROBIC 5CC  Final   Culture   Final   PEPTOSTREPTOCOCCUS SPECIES Note: Gram Stain Report Called to,Read Back By and Verified With: JANELL GARLAND @2040  10/12/14 Performed at [facility]    Report Status 10/14/2014 FINAL  Final  Culture, blood (routine x 2)     Status: None   Collection Time: 10/11/14  8:35 PM  Result Value Ref Range Status   Specimen Description BLOOD RWRIST  Final   Special Requests BOTTLES DRAWN AEROBIC AND ANAEROBIC 5CC  Final   Culture   Final    PEPTOSTREPTOCOCCUS SPECIES Note: Gram Stain Report Called to,Read Back By and Verified With:  JANELL GARLAND @2040  10/12/14 Performed at [facility]    Report Status 10/14/2014 FINAL  Final  Urine culture     Status: None   Collection Time: 10/11/14 10:15 PM  Result Value Ref Range Status   Specimen Description URINE, CLEAN CATCH  Final   Special Requests NONE  Final   Colony Count   Final    30,000 COLONIES/ML Performed at [facility]    Culture   Final    Multiple bacterial morphotypes present, none predominant. Suggest appropriate recollection if clinically indicated. Performed at [facility]    Report Status 10/12/2014 FINAL  Final  MRSA PCR Screening     Status: None   Collection Time:  10/12/14 11:12 AM  Result Value Ref Range Status   MRSA by PCR NEGATIVE NEGATIVE Final    Comment:        The GeneXpert MRSA Assay (FDA approved for NASAL specimens only), is one component of a comprehensive MRSA colonization surveillance program. It is not intended to diagnose MRSA infection nor to guide or monitor treatment for MRSA infections.   Culture, routine-abscess     Status: None (Preliminary result)   Collection Time: 10/12/14  3:44 PM  Result Value Ref Range Status   Specimen Description ABSCESS  Final   Special Requests Normal  Final   Gram Stain   Final    RARE WBC PRESENT, PREDOMINANTLY PMN NO SQUAMOUS EPITHELIAL CELLS SEEN NO ORGANISMS SEEN Performed at [facility]    Culture   Final     NO GROWTH 2 DAYS Performed at [facility]    Report Status PENDING  Incomplete  Anaerobic culture     Status: None (Preliminary result)   Collection Time: 10/12/14  3:44 PM  Result Value Ref Range Status   Specimen Description ABSCESS  Final   Special Requests Normal  Final   Gram Stain   Final    RARE WBC PRESENT, PREDOMINANTLY PMN NO SQUAMOUS EPITHELIAL CELLS SEEN NO ORGANISMS SEEN Performed at [facility]    Culture   Final    NO ANAEROBES ISOLATED; CULTURE IN PROGRESS FOR 5 DAYS Performed at [facility]    Report Status PENDING  Incomplete    Studies/Results: No results found.  Assessment/Plan: Right hydronephrosis, right distal ureteral stricture-status post cystoscopy with ureteral stricture dilation and right ureteral stent placement.  Foley DC'd today.  Right retroperitoneal/perinephric fluid collection-drain was scant output.  Testing consistent with serum and not urine.  We'll monitor output over the next 24 hours and DC the drain prior to her going home if output remains scant.  Sepsis - ? Source.  Blood cultures growing Peptostreptococcus. Sensitivities pending. Antibiotics per primary team.  Will follow.    LOS: 3 days   Stacy Webster 10/15/2014, 7:39 AM

## 2014-10-15 NOTE — Progress Notes (Signed)
Message sent to Tylene Fantasia PA to report abnormal lab results.

## 2014-10-15 NOTE — Progress Notes (Signed)
ANTIBIOTIC CONSULT NOTE   Pharmacy Consult for Unasyn Indication: peptostreptococcus bacteremia/intrabdominal infection  Allergies  Allergen Reactions  . Codeine Nausea Only  . Hydrocodone Nausea And Vomiting   Patient Measurements: Height: 5\' 4"  (162.6 cm) Weight: 174 lb 12.8 oz (79.289 kg) IBW/kg (Calculated) : 54.7  Vital Signs: Temp: 98.8 F (37.1 C) (05/20 0500) Temp Source: Oral (05/20 0500) BP: 120/59 mmHg (05/20 0500) Pulse Rate: 90 (05/20 0500) Intake/Output from previous day: 05/19 0701 - 05/20 0700 In: 270 [I.V.:270] Out: 850 [Urine:850] Intake/Output from this shift:    Labs:  Recent Labs  10/13/14 0020 10/13/14 0643 10/14/14 1021 10/15/14 0425 10/15/14 0721  WBC 16.2* 13.7*  --   --  10.3  HGB 7.8* 8.2*  --   --  8.0*  PLT 233 261  --   --  283  CREATININE  --  1.09* 0.84 0.95  --    Estimated Creatinine Clearance: 69.7 mL/min (by C-G formula based on Cr of 0.95).  Recent Labs  10/14/14 1021  [place] [address]    Microbiology: Recent Results (from the past 720 hour(s))  Culture, blood (routine x 2)     Status: None   Collection Time: 10/11/14  8:30 PM  Result Value Ref Range Status   Specimen Description BLOOD RAC  Final   Special Requests BOTTLES DRAWN AEROBIC AND ANAEROBIC 5CC  Final   Culture   Final    PEPTOSTREPTOCOCCUS SPECIES Note: Gram Stain Report Called to,Read Back By and Verified With: JANELL GARLAND @2040  10/12/14 Performed at [facility]    Report Status 10/14/2014 FINAL  Final  Culture, blood (routine x 2)     Status: None   Collection Time: 10/11/14  8:35 PM  Result Value Ref Range Status   Specimen Description BLOOD RWRIST  Final   Special Requests BOTTLES DRAWN AEROBIC AND ANAEROBIC 5CC  Final   Culture   Final    PEPTOSTREPTOCOCCUS SPECIES Note: Gram Stain Report Called to,Read Back By and Verified With:  JANELL GARLAND @2040  10/12/14 Performed at [facility]    Report Status 10/14/2014 FINAL  Final   Urine culture     Status: None   Collection Time: 10/11/14 10:15 PM  Result Value Ref Range Status   Specimen Description URINE, CLEAN CATCH  Final   Special Requests NONE  Final   Colony Count   Final    30,000 COLONIES/ML Performed at [facility]    Culture   Final    Multiple bacterial morphotypes present, none predominant. Suggest appropriate recollection if clinically indicated. Performed at [facility]    Report Status 10/12/2014 FINAL  Final  MRSA PCR Screening     Status: None   Collection Time: 10/12/14 11:12 AM  Result Value Ref Range Status   MRSA by PCR NEGATIVE NEGATIVE Final    Comment:        The GeneXpert MRSA Assay (FDA approved for NASAL specimens only), is one component of a comprehensive MRSA colonization surveillance program. It is not intended to diagnose MRSA infection nor to guide or monitor treatment for MRSA infections.   Culture, routine-abscess     Status: None (Preliminary result)   Collection Time: 10/12/14  3:44 PM  Result Value Ref Range Status   Specimen Description ABSCESS  Final   Special Requests Normal  Final   Gram Stain   Final    RARE WBC PRESENT, PREDOMINANTLY PMN NO SQUAMOUS EPITHELIAL CELLS SEEN NO ORGANISMS SEEN Performed at [facility]    Culture   Final    NO GROWTH 2 DAYS Performed at [facility]    Report Status PENDING  Incomplete  Anaerobic culture     Status: None (Preliminary result)   Collection Time: 10/12/14  3:44 PM  Result Value Ref Range Status   Specimen Description ABSCESS  Final   Special Requests Normal  Final   Gram Stain   Final    RARE WBC PRESENT, PREDOMINANTLY PMN NO SQUAMOUS EPITHELIAL CELLS SEEN NO ORGANISMS SEEN Performed at [facility]    Culture   Final    NO ANAEROBES ISOLATED; CULTURE IN PROGRESS FOR 5 DAYS Performed at [facility]    Report Status PENDING  Incomplete   Medical History: Past Medical History  Diagnosis Date   . Cancer     uterine and cervical    Medications:  Scheduled:  . cefTRIAXone (ROCEPHIN)  IV  2 g Intravenous Q24H  . pantoprazole  40 mg Oral Daily  . potassium chloride  10 mEq Intravenous Q1 Hr x 4  . potassium chloride  40 mEq Oral Q4H   Assessment: 1 YOF presents with fever and abdominal pain, she is undergoing chemo for uterine and cervical cancer.  Pharmacy asked to dose ceftriaxone and vancomycin for sepsis with UTI. Large perinephric fluid collection drained 5/17, Cysto and R stent placed 5/18.  5/16 >> Rocephin >> 5/20 5/17 >> Vancomycin >> 5/19 5/20 >> Unasyn >>  Tmax 100.2 WBC reduced to 10.3 Scr stable, CrCl 70  Vancomycin trough 20 mcg/ml on 750mg  q12  5/16 blood: Peptostreptococcus 5/16 urine: ngf 5/17 abscess: ngtd  Goal of Therapy:  Treatment of infection  Plan:  Unasyn 3g IV q6   Adrian Saran, PharmD, BCPS Pager [phone] 10/15/2014 10:00 AM

## 2014-10-15 NOTE — Progress Notes (Signed)
Date:  Oct 15, 2014 U.R. performed for needs and level of care. Will continue to follow for Case Management needs.  Velva Harman, RN, BSN, [place]   [phone]

## 2014-10-15 NOTE — Progress Notes (Addendum)
Patient Demographics  Stacy Webster, is a 54 y.o. female, DOB - May 14, 1961, YIA:[id number]  Admit date - 10/11/2014   Admitting Physician Collene Gobble, MD  Outpatient Primary MD for the patient is [name], MD  LOS - 3   Chief Complaint  Patient presents with  . Fever  . Abdominal Pain      INITIAL PRESENTATION: 54 yo woman with cervical CA just completed neo-adjuvant chemo/XRT at [facility]. Presented w R flank pain and fever, found to have large R perinephric fluid collection and suspected ureteral obstruction. Developed shock in ED despite IVF. Evaluated by Urology and IR. She underwent cystoscopy with right ureteric extent placement along with right perinephric drain placement under the guidance of urology. She has been placed on IV antibiotics. She was stabilized and transferred to hospitalist service on 10/15/2014.  Subjective:   Stacy Webster today has, No headache, No chest pain, mild lower abdominal pain - No Nausea, No new weakness tingling or numbness, No Cough - SOB.   Assessment & Plan    1. Sepsis with Peptostreptococcus bacteremia secondary to combination of right-sided hydronephrosis, right-sided perinephric fluid collection along with postradiation pelvic inflammation around the small bowel. Sepsis is resolved after appropriate supportive care in ICU under the care of pulmonary crit care. She is now transferred to the floor. She is hemodynamically stable and afebrile, leukocytosis has resolved, her case was discussed with urologist [name] by me today and infectious disease physician Dr. Drucilla Schmidt by me on 10/15/2014. We will place her on IV Unasyn based on her culture data. Repeat CT scan in 1-2 days. Repeat blood cultures tomorrow. If stable place on oral Augmentin for 10 more  days and discharged home.   2. Cervical cancer. Was getting treatment at no month, he want to see a new oncologist. Appointment set with Dr. [name] for early next month. [place]   3. Mild antibiotic-induced diarrhea. C. difficile pending rule out C. difficile infection.   4.Hypokalemia - replaced, monitor.     Code Status: Full  Family Communication: sister in law  Disposition Plan: [place]  Urology, PCCM, ID Dr Edsel Petrin over the phone   Procedures    5/17 ct abd as noted 5/17 renal US after fluid drainage >> continued hydronephrosis  TTE 5/18 >> normal LV and RV fxn, normal valves -  Left ventricle: The cavity size was normal. Wall thickness wasincreased in a pattern of mild LVH. Systolic function was normal. The estimated ejection fraction was in the range of 55% to 60%. Wall motion was normal; there were no regional wall motion abnormalities. Left ventricular diastolic function parameters were normal.  Perc drainage of R perinephric fluid collection 5/17 Cystoscopy and R ureteral stent placement 5/18   DVT Prophylaxis  Heparin   Lab Results  Component Value Date   PLT 283 10/15/2014    Medications  Scheduled Meds: . ampicillin-sulbactam (UNASYN) IV  3 g Intravenous Q6H  . pantoprazole  40 mg Oral Daily  . potassium chloride  40 mEq Oral Q4H   Continuous Infusions: . sodium chloride 10 mL/hr at 10/15/14 0311   PRN Meds:.acetaminophen, albuterol, fentaNYL (SUBLIMAZE) injection, LORazepam, ondansetron, promethazine  Antibiotics  Anti-infectives    Start     Dose/Rate Route Frequency Ordered Stop   10/15/14 1100  Ampicillin-Sulbactam (UNASYN) 3 g in sodium chloride 0.9 % 100 mL IVPB     3 g 100 mL/hr over 60 Minutes Intravenous Every 6 hours 10/15/14 1002     10/12/14 2200  cefTRIAXone (ROCEPHIN) 2 g in dextrose 5 % 50 mL IVPB  Status:  Discontinued     2 g 100 mL/hr over 30 Minutes Intravenous Every 24 hours  10/11/14 2101 10/15/14 1002   10/12/14 1200  vancomycin (VANCOCIN) IVPB 750 mg/150 ml premix  Status:  Discontinued     750 mg 150 mL/hr over 60 Minutes Intravenous Every 12 hours 10/12/14 0857 10/14/14 1131   10/12/14 0815  vancomycin (VANCOCIN) IVPB 1000 mg/200 mL premix  Status:  Discontinued     1,000 mg 200 mL/hr over 60 Minutes Intravenous STAT 10/12/14 0807 10/12/14 0809   10/12/14 0000  vancomycin (VANCOCIN) 1,250 mg in sodium chloride 0.9 % 250 mL IVPB     1,250 mg 166.7 mL/hr over 90 Minutes Intravenous  Once 10/11/14 2352 10/12/14 0151   10/11/14 2100  cefTRIAXone (ROCEPHIN) 2 g in dextrose 5 % 50 mL IVPB     2 g 100 mL/hr over 30 Minutes Intravenous  Once 10/11/14 2053 10/11/14 2258        Objective:   Filed Vitals:   10/14/14 2053 10/15/14 0251 10/15/14 0500 10/15/14 1045  BP: 97/53  120/59   Pulse: 87  90   Temp: 98.8 F (37.1 C) 100.2 F (37.9 C) 98.8 F (37.1 C) 101.3 F (38.5 C)  TempSrc: Oral Oral Oral Oral  Resp: 18  18   Height:      Weight:   79.289 kg (174 lb 12.8 oz)   SpO2: 99%  99%     Wt Readings from Last 3 Encounters:  10/15/14 79.289 kg (174 lb 12.8 oz)     Intake/Output Summary (Last 24 hours) at 10/15/14 1331 Last data filed at 10/15/14 0600  Gross per 24 hour  Intake    120 ml  Output    850 ml  Net   -730 ml     Physical Exam  Awake Alert, Oriented X 3, No new F.N deficits, Normal affect [place].AT,PERRAL Supple Neck,No JVD, No cervical lymphadenopathy appriciated.  Symmetrical Chest wall movement, Good air movement bilaterally, CTAB RRR,No Gallops,Rubs or new Murmurs, No Parasternal Heave +ve B.Sounds, Abd Soft, No tenderness, No organomegaly appriciated, No rebound - guarding or rigidity. No Cyanosis, Clubbing or edema, No new Rash or bruise , L JP drain   Data Review   Micro Results Recent Results (from the past 240 hour(s))  Culture, blood (routine x 2)     Status: None   Collection Time: 10/11/14  8:30 PM  Result Value  Ref Range Status   Specimen Description BLOOD RAC  Final   Special Requests BOTTLES DRAWN AEROBIC AND ANAEROBIC 5CC  Final   Culture   Final    PEPTOSTREPTOCOCCUS SPECIES Note: Gram Stain Report Called to,Read Back By and Verified With: JANELL GARLAND @2040  10/12/14 Performed at [facility]    Report Status 10/14/2014 FINAL  Final  Culture, blood (routine x 2)     Status: None   Collection Time: 10/11/14  8:35 PM  Result Value Ref Range Status   Specimen Description BLOOD RWRIST  Final   Special Requests BOTTLES DRAWN AEROBIC AND ANAEROBIC 5CC  Final   Culture  Final    PEPTOSTREPTOCOCCUS SPECIES Note: Gram Stain Report Called to,Read Back By and Verified With:  JANELL GARLAND @2040  10/12/14 Performed at [facility]    Report Status 10/14/2014 FINAL  Final  Urine culture     Status: None   Collection Time: 10/11/14 10:15 PM  Result Value Ref Range Status   Specimen Description URINE, CLEAN CATCH  Final   Special Requests NONE  Final   Colony Count   Final    30,000 COLONIES/ML Performed at [facility]    Culture   Final    Multiple bacterial morphotypes present, none predominant. Suggest appropriate recollection if clinically indicated. Performed at [facility]    Report Status 10/12/2014 FINAL  Final  MRSA PCR Screening     Status: None   Collection Time: 10/12/14 11:12 AM  Result Value Ref Range Status   MRSA by PCR NEGATIVE NEGATIVE Final    Comment:        The GeneXpert MRSA Assay (FDA approved for NASAL specimens only), is one component of a comprehensive MRSA colonization surveillance program. It is not intended to diagnose MRSA infection nor to guide or monitor treatment for MRSA infections.   Culture, routine-abscess     Status: None (Preliminary result)   Collection Time: 10/12/14  3:44 PM  Result Value Ref Range Status   Specimen Description ABSCESS  Final   Special Requests Normal  Final   Gram Stain   Final     RARE WBC PRESENT, PREDOMINANTLY PMN NO SQUAMOUS EPITHELIAL CELLS SEEN NO ORGANISMS SEEN Performed at [facility]    Culture   Final    NO GROWTH 2 DAYS Performed at [facility]    Report Status PENDING  Incomplete  Anaerobic culture     Status: None (Preliminary result)   Collection Time: 10/12/14  3:44 PM  Result Value Ref Range Status   Specimen Description ABSCESS  Final   Special Requests Normal  Final   Gram Stain   Final    RARE WBC PRESENT, PREDOMINANTLY PMN NO SQUAMOUS EPITHELIAL CELLS SEEN NO ORGANISMS SEEN Performed at [facility]    Culture   Final    NO ANAEROBES ISOLATED; CULTURE IN PROGRESS FOR 5 DAYS Performed at [facility]    Report Status PENDING  Incomplete    Radiology Reports Ct Abdomen Pelvis W Contrast  10/12/2014   CLINICAL DATA:  Subacute onset of fever for 3 weeks. Elevated lactic acid. 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 Initial encounter.  EXAM: CT ABDOMEN AND PELVIS WITH CONTRAST  TECHNIQUE: Multidetector CT imaging of the abdomen and pelvis was performed using the standard protocol following bolus administration of intravenous contrast.  CONTRAST:  124mL OMNIPAQUE IOHEXOL 300 MG/ML  SOLN  COMPARISON:  None.  FINDINGS: The visualized lung bases are clear.  The liver and spleen are unremarkable in appearance. The patient is status post cholecystectomy, with clips noted along the gallbladder fossa. The pancreas and adrenal glands are unremarkable.  A large amount of blood is noted within the right renal calyces and right renal pelvis, and extending along the course of the right ureter. 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 This likely reflects ureteral obstruction, with blood filling the right renal collecting system.  The left kidney is unremarkable in appearance.  The small bowel is unremarkable in appearance. The  stomach is within normal  limits. No acute vascular abnormalities are seen.  The appendix is not definitely characterized; there is no evidence of appendicitis. Contrast progresses to the level of the rectum. The colon is unremarkable in appearance.  There is diffuse soft tissue stranding and fluid within the pelvis. This may reflect postradiation change, or possibly acute infection or inflammation. This tracks about the adjacent distal ileum.  The uterus is enlarged, and contains central fluid attenuation measuring 8.8 cm in size. This may reflect the underlying malignancy, or postradiation necrosis. An underlying calcified fibroid is seen on the left. Heterogeneous attenuation is noted extending to the cervix, and there is diffuse soft tissue attenuation extending into the right side of the base of the bladder. This raises concern for tumor infiltration, though postradiation change could have a similar appearance. This likely explains right ureteral obstruction, as described above.  A 4.4 cm cystic focus is noted at the right ovary, likely physiologic in nature.  The bladder is mildly distended. Mild surrounding soft tissue inflammation may reflect postradiation change or possibly cystitis. No inguinal lymphadenopathy is seen.  No acute osseous abnormalities are identified. There is chronic loss of height involving the superior endplate of L3.  IMPRESSION: 1. Large amount of blood noted within the right renal calyces and right renal pelvis, extending along the course of the right ureter. 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 This likely reflects ureteral obstruction, with blood filling the right renal collecting system. 2. Right ureteral obstruction appears to reflect diffuse soft tissue attenuation extending into the right side of the base of bladder. This raises concern for tumor infiltration, though postradiation change could have a similar appearance. 3.  Enlarged uterus, containing central fluid attenuation, measuring 8.8 cm. This may reflect the underlying malignancy, or postradiation necrosis. Heterogeneous attenuation extends to the cervix. 4. Diffuse soft tissue inflammation and fluid within the pelvis. This may reflect postradiation change, or possibly acute infection or inflammation. This tracks about the adjacent distal ileum. 5. Mild soft tissue inflammation about the bladder may reflect postradiation change or possibly cystitis.  These results were called by telephone at the time of interpretation on 10/12/2014 at 2:01 am to Dr. Linton Flemings, who verbally acknowledged these results.   Electronically Signed   By: Garald Balding M.D.   On: 10/12/2014 02:23   US Renal  10/12/2014   CLINICAL DATA:  Acute onset of right-sided flank pain for 2 days. Fever. Status post CT-guided drainage of right-sided perinephric fluid. Subsequent encounter.  EXAM: RENAL / URINARY TRACT ULTRASOUND COMPLETE  COMPARISON:  CT of the abdomen and pelvis performed earlier today at 1:22 a.m.  FINDINGS: Right Kidney:  Length: 13.6 cm. The previously noted large right-sided perinephric fluid collection has resolved. The associated drainage catheter is noted adjacent to the right kidney. There is underlying persistent severe right-sided hydronephrosis. The nature of the fluid within the right renal collecting system is not well assessed on ultrasound.  Left Kidney:  Length: 14.1 cm. Echogenicity within normal limits. No mass or hydronephrosis visualized.  Bladder:  Decompressed, with a Foley catheter in place.  The uterus is enlarged, as noted on prior CT, measuring 12.3 x 8.9 x 9.4 cm, and fluid echogenicity is noted filling the endometrial echo complex, measuring 7.3 x 6.4 cm.  IMPRESSION: 1. Interval resolution of previously noted large right-sided perinephric fluid collection. Associated drainage catheter now seen adjacent to the right kidney. 2. Underlying persistent severe  right-sided hydronephrosis. The nature of  the fluid within the right renal collecting system is not well assessed on ultrasound. 3. Enlarged uterus again noted, with fluid echogenicity filling the endometrial echo complex. As noted on CT, this could reflect postradiation necrosis, underlying malignancy or possibly obstruction at the level of the cervix.   Electronically Signed   By: Garald Balding M.D.   On: 10/12/2014 19:35   Dg Chest Port 1 View  10/11/2014   CLINICAL DATA:  54 year old female with 5 week history of fever. Medical history includes uterine and cervical cancer.  EXAM: PORTABLE CHEST - 1 VIEW  COMPARISON:  None.  FINDINGS: The lungs are clear and negative for focal airspace consolidation, pulmonary edema or suspicious pulmonary nodule. No pleural effusion or pneumothorax. Cardiac and mediastinal contours are within normal limits. No acute fracture or lytic or blastic osseous lesions. The visualized upper abdominal bowel gas pattern is unremarkable.  IMPRESSION: No active cardiopulmonary disease.   Electronically Signed   By: Jacqulynn Cadet M.D.   On: 10/11/2014 21:20   Ct Image Guided Drainage Percut Cath  Peritoneal Retroperit  10/12/2014   CLINICAL DATA:  Right perinephric fluid collection, fever, bacteremia  EXAM: CT-GUIDED RIGHT PERINEPHRIC FLUID COLLECTION DRAIN INSERTION  Date:  5/17/20165/17/2016 3:10 pm  Radiologist:  M. Daryll Brod, MD  Guidance:  ULTRASOUND AND FLUOROSCOPIC  FLUOROSCOPY TIME:  NONE.  MEDICATIONS AND MEDICAL HISTORY: 1% lidocaine locally, 0.5 mg Versed, 25 mcg fentanyl  ANESTHESIA/SEDATION: 15 minutes  CONTRAST:  None.  COMPLICATIONS: None immediate  PROCEDURE: Informed consent was obtained from the patient following explanation of the procedure, risks, benefits and alternatives. The patient understands, agrees and consents for the procedure. All questions were addressed. A time out was performed.  Maximal barrier sterile technique utilized including caps, mask,  sterile gowns, sterile gloves, large sterile drape, hand hygiene, and Betadine.  Previous imaging reviewed. Patient positioned right anterior oblique. Noncontrast CT performed through the abdomen to demonstrate the large right perinephric fluid collection. Under sterile conditions and local anesthesia, an 18 gauge 10 cm access needle was advanced percutaneously into the collection. Needle position confirmed with CT. Guidewire advanced followed by tract dilatation to insert a 10 [place] drain. Catheter position confirmed with repeat imaging. Syringe aspiration yielded 635 cc dark thin fluid. Sample sent for Gram stain and culture. Catheter secured with a Prolene suture and connected to external suction bulb. Sterile dressing applied. No immediate complication. Patient tolerated the procedure well.  IMPRESSION: Successful CT-guided right perinephric abscess drain insertion.   Electronically Signed   By: Jerilynn Mages.  Shick M.D.   On: 10/12/2014 16:09     CBC  Recent Labs Lab 10/11/14 2042 10/12/14 0920 10/13/14 0020 10/13/14 0643 10/15/14 0721  WBC 8.7 16.9* 16.2* 13.7* 10.3  HGB 7.3* 5.0* 7.8* 8.2* 8.0*  HCT 23.1* 15.5* 23.0* 24.7* 24.2*  PLT 409* 286 233 261 283  MCV 94.3 93.9 88.1 87.6 88.6  MCH 29.8 30.3 29.9 29.1 29.3  MCHC 31.6 32.3 33.9 33.2 33.1  RDW 20.3* 20.3* 20.7* 20.9* 20.8*  LYMPHSABS 0.1*  --   --   --   --   MONOABS 0.1  --   --   --   --   EOSABS 0.0  --   --   --   --   BASOSABS 0.0  --   --   --   --     Chemistries   Recent Labs Lab 10/11/14 2042 10/12/14 0920 10/13/14 0643 10/14/14 1021 10/15/14 0425  NA 128*  --  134* 133* 133*  K 2.9*  --  2.8* 3.0* 2.8*  CL 92*  --  100* 101 101  CO2 25  --  23 22 22   GLUCOSE 110*  --  94 116* 90  BUN 11  --  11 14 12   CREATININE 1.16* 0.98 1.09* 0.84 0.95  CALCIUM 8.1*  --  7.0* 7.6* 7.2*  MG  --  1.4*  --   --   --   AST 27  --   --   --   --   ALT 19  --   --   --   --   ALKPHOS 106  --   --   --   --   BILITOT 1.1  --    --   --   --    ------------------------------------------------------------------------------------------------------------------ estimated creatinine clearance is 69.7 mL/min (by C-G formula based on Cr of 0.95). ------------------------------------------------------------------------------------------------------------------ No results for input(s): HGBA1C in the last 72 hours. ------------------------------------------------------------------------------------------------------------------ No results for input(s): CHOL, HDL, LDLCALC, TRIG, CHOLHDL, LDLDIRECT in the last 72 hours. ------------------------------------------------------------------------------------------------------------------ No results for input(s): TSH, T4TOTAL, T3FREE, THYROIDAB in the last 72 hours.  Invalid input(s): FREET3 ------------------------------------------------------------------------------------------------------------------ No results for input(s): VITAMINB12, FOLATE, FERRITIN, TIBC, IRON, RETICCTPCT in the last 72 hours.  Coagulation profile  Recent Labs Lab 10/12/14 0500  INR 1.42    No results for input(s): DDIMER in the last 72 hours.  Cardiac Enzymes No results for input(s): CKMB, TROPONINI, MYOGLOBIN in the last 168 hours.  Invalid input(s): CK ------------------------------------------------------------------------------------------------------------------ Invalid input(s): POCBNP   Time Spent in minutes  35   Marquavious Nazar K M.D on 10/15/2014 at 1:31 PM  Between 7am to 7pm - Pager - [phone]  After 7pm go to [email or web address] - password [facility]  [facility]   Office  [phone]

## 2014-10-16 ENCOUNTER — Inpatient Hospital Stay (HOSPITAL_COMMUNITY): Payer: BC Managed Care – PPO

## 2014-10-16 DIAGNOSIS — E440 Moderate protein-calorie malnutrition: Secondary | ICD-10-CM | POA: Insufficient documentation

## 2014-10-16 DIAGNOSIS — A408 Other streptococcal sepsis: Principal | ICD-10-CM

## 2014-10-16 LAB — CBC
HCT: 27.2 % — ABNORMAL LOW (ref 36.0–46.0)
Hemoglobin: 8.6 g/dL — ABNORMAL LOW (ref 12.0–15.0)
MCH: 28.6 pg (ref 26.0–34.0)
MCHC: 31.6 g/dL (ref 30.0–36.0)
MCV: 90.4 fL (ref 78.0–100.0)
Platelets: 281 10*3/uL (ref 150–400)
RBC: 3.01 MIL/uL — ABNORMAL LOW (ref 3.87–5.11)
RDW: 20.4 % — AB (ref 11.5–15.5)
WBC: 14.3 10*3/uL — AB (ref 4.0–10.5)

## 2014-10-16 LAB — BASIC METABOLIC PANEL
Anion gap: 8 (ref 5–15)
BUN: 9 mg/dL (ref 6–20)
CO2: 28 mmol/L (ref 22–32)
CREATININE: 0.91 mg/dL (ref 0.44–1.00)
Calcium: 7.9 mg/dL — ABNORMAL LOW (ref 8.9–10.3)
Chloride: 98 mmol/L — ABNORMAL LOW (ref 101–111)
GFR calc non Af Amer: >60 mL/min (ref >60)
GLUCOSE: 93 mg/dL (ref 65–99)
POTASSIUM: 3.7 mmol/L (ref 3.5–5.1)
SODIUM: 134 mmol/L — AB (ref 135–145)

## 2014-10-16 LAB — MAGNESIUM: MAGNESIUM: 1.8 mg/dL (ref 1.7–2.4)

## 2014-10-16 LAB — CULTURE, ROUTINE-ABSCESS
CULTURE: NO GROWTH
SPECIAL REQUESTS: NORMAL

## 2014-10-16 MED ORDER — BOOST PLUS PO LIQD
237.0000 mL | Freq: Three times a day (TID) | ORAL | Status: DC
  Administered 2014-10-16 – 2014-10-17 (×3): 237 mL via ORAL
  Filled 2014-10-16 (×8): qty 237

## 2014-10-16 NOTE — Progress Notes (Signed)
3 Days Post-Op Subjective: Patient reports some persistent nausea.  Temp of 101.3 yesterday.  Peri-nephric drain with minimal output and not bothering patient.  Patient feeling much better this am than yesterday.  Has had diarrhea with positive C-Diff culture.  Objective: Vital signs in last 24 hours: Temp:  [98.4 F (36.9 C)-101.3 F (38.5 C)] 98.9 F (37.2 C) (05/21 0519) Pulse Rate:  [86-100] 86 (05/21 0519) Resp:  [18-20] 20 (05/21 0519) BP: (126-131)/(60-79) 131/62 mmHg (05/21 0519) SpO2:  [100 %] 100 % (05/21 0519) Weight:  [172 lb 2.9 oz (78.1 kg)] 172 lb 2.9 oz (78.1 kg) (05/21 0519)  Intake/Output from previous day: 05/20 0701 - 05/21 0700 In: 505 [I.V.:100; IV Piggyback:400] Out: 10 [Drains:10]  JP - 5 ml  Intake/Output this shift:    Physical Exam:  General: NAD Abd - soft, NT Right retroperitoneal drain- minimal clear serosanguineous fluid  Lab Results:  CBC    Component Value Date/Time   WBC 14.3* 10/16/2014 0420   RBC 3.01* 10/16/2014 0420   HGB 8.6* 10/16/2014 0420   HCT 27.2* 10/16/2014 0420   PLT 281 10/16/2014 0420   MCV 90.4 10/16/2014 0420   MCH 28.6 10/16/2014 0420   MCHC 31.6 10/16/2014 0420   RDW 20.4* 10/16/2014 0420   LYMPHSABS 0.1* 10/11/2014 2042   MONOABS 0.1 10/11/2014 2042   EOSABS 0.0 10/11/2014 2042   BASOSABS 0.0 10/11/2014 2042      Recent Labs  10/15/14 0721 10/16/14 0420  HGB 8.0* 8.6*  HCT 24.2* 27.2*   BMET  Recent Labs  10/15/14 0425 10/16/14 0420  NA 133* 134*  K 2.8* 3.7  CL 101 98*  CO2 22 28  GLUCOSE 90 93  BUN 12 9  CREATININE 0.95 0.91  CALCIUM 7.2* 7.9*   No results for input(s): LABPT, INR in the last 72 hours. No results for input(s): LABURIN in the last 72 hours. Results for orders placed or performed during the hospital encounter of 10/11/14  Culture, blood (routine x 2)     Status: None   Collection Time: 10/11/14  8:30 PM  Result Value Ref Range Status   Specimen Description BLOOD RAC   Final   Special Requests BOTTLES DRAWN AEROBIC AND ANAEROBIC 5CC  Final   Culture   Final    PEPTOSTREPTOCOCCUS SPECIES Note: Gram Stain Report Called to,Read Back By and Verified With: JANELL GARLAND @2040  10/12/14 Performed at [facility]    Report Status 10/14/2014 FINAL  Final  Culture, blood (routine x 2)     Status: None   Collection Time: 10/11/14  8:35 PM  Result Value Ref Range Status   Specimen Description BLOOD RWRIST  Final   Special Requests BOTTLES DRAWN AEROBIC AND ANAEROBIC 5CC  Final   Culture   Final    PEPTOSTREPTOCOCCUS SPECIES Note: Gram Stain Report Called to,Read Back By and Verified With:  JANELL GARLAND @2040  10/12/14 Performed at [facility]    Report Status 10/14/2014 FINAL  Final  Urine culture     Status: None   Collection Time: 10/11/14 10:15 PM  Result Value Ref Range Status   Specimen Description URINE, CLEAN CATCH  Final   Special Requests NONE  Final   Colony Count   Final    30,000 COLONIES/ML Performed at [facility]    Culture   Final    Multiple bacterial morphotypes present, none predominant. Suggest appropriate recollection if clinically indicated. Performed at [facility]    Report  Status 10/12/2014 FINAL  Final  MRSA PCR Screening     Status: None   Collection Time: 10/12/14 11:12 AM  Result Value Ref Range Status   MRSA by PCR NEGATIVE NEGATIVE Final    Comment:        The GeneXpert MRSA Assay (FDA approved for NASAL specimens only), is one component of a comprehensive MRSA colonization surveillance program. It is not intended to diagnose MRSA infection nor to guide or monitor treatment for MRSA infections.   Culture, routine-abscess     Status: None   Collection Time: 10/12/14  3:44 PM  Result Value Ref Range Status   Specimen Description ABSCESS  Final   Special Requests Normal  Final   Gram Stain   Final    RARE WBC PRESENT, PREDOMINANTLY PMN NO SQUAMOUS EPITHELIAL CELLS SEEN NO  ORGANISMS SEEN Performed at [facility]    Culture   Final    NO GROWTH 3 DAYS Performed at [facility]    Report Status 10/16/2014 FINAL  Final  Anaerobic culture     Status: None (Preliminary result)   Collection Time: 10/12/14  3:44 PM  Result Value Ref Range Status   Specimen Description ABSCESS  Final   Special Requests Normal  Final   Gram Stain   Final    RARE WBC PRESENT, PREDOMINANTLY PMN NO SQUAMOUS EPITHELIAL CELLS SEEN NO ORGANISMS SEEN Performed at [facility]    Culture   Final    NO ANAEROBES ISOLATED; CULTURE IN PROGRESS FOR 5 DAYS Performed at [facility]    Report Status PENDING  Incomplete  Clostridium Difficile by PCR     Status: Abnormal   Collection Time: 10/15/14 12:36 PM  Result Value Ref Range Status   C difficile by pcr POSITIVE (A) NEGATIVE Final    Comment: CRITICAL RESULT CALLED TO, READ BACK BY AND VERIFIED WITH: Marylou Mccoy [id number] @ 1426 BY J SCOTTON     Studies/Results: No results found.  Assessment/Plan: Right hydronephrosis, right distal ureteral stricture-status post cystoscopy with ureteral stricture dilation and right ureteral stent placement.  Foley DC'd yesterday.  Right retroperitoneal/perinephric fluid collection with persistent low output.  Testing consistent with serum and not urine.  Continue drain today given fevers yesterday.  Will follow blood cultures and likely remove drain tomorrow vs. Monday pending her clinical progress.  We reviewed her CT from this am and it appears the stent is in good position without residual hydro and the peri-nephric drain is in place and there is very little residual fluid there.  Sepsis - ? Source.  Blood cultures growing Peptostreptococcus. New blood cultures sent.  Antibiotics per primary team.  Will follow.    LOS: 4 days   Curt Bears 10/16/2014, 9:12 AM    Patient was seen, examined,treatment plan was discussed with the resident.  I have directly  reviewed the clinical findings, lab, imaging studies and management of this patient in detail. I have made the necessary changes and/or additions to the above noted documentation, and agree with the documentation, as recorded by the resident.

## 2014-10-16 NOTE — Progress Notes (Signed)
Referring Physician(s): [place]  Subjective:  Rt perinephric fluid drain placed 5/17 Draining well 30 cc in JP now CT 5/20 shows almost resolved collection But spike in temp last pm and this am: 101    Allergies: Codeine and Hydrocodone  Medications: Prior to Admission medications   Medication Sig Start Date End Date Taking? Authorizing Provider  acetaminophen (TYLENOL) 500 MG tablet Take 500 mg by mouth every 6 (six) hours as needed.   Yes Historical Provider, MD  ADVAIR DISKUS 250-50 MCG/DOSE AEPB Inhale 1 puff into the lungs daily.  10/02/14  Yes Historical Provider, MD  ibuprofen (ADVIL,MOTRIN) 200 MG tablet Take 600 mg by mouth every 6 (six) hours as needed for fever (fever).   Yes Historical Provider, MD  LORazepam (ATIVAN) 1 MG tablet Take 1 mg by mouth every 6 (six) hours as needed (nausea).  09/29/14  Yes Historical Provider, MD  ondansetron (ZOFRAN-ODT) 8 MG disintegrating tablet Take 8 mg by mouth every 8 (eight) hours as needed for nausea or vomiting.  08/11/14   Historical Provider, MD     Vital Signs: BP 131/62 mmHg  Pulse 86  Temp(Src) 98.9 F (37.2 C) (Oral)  Resp 20  Ht 5\' 4"  (1.626 m)  Wt 78.1 kg (172 lb 2.9 oz)  BMI 29.54 kg/m2  SpO2 100%  Physical Exam  Skin: Skin is warm.  Site of Rt perinephric drain clean and dry NT no bleeding Output yellow 30 cc in JP (24 hrs worth per pt) afeb now Spike last pm 101 Wbc 14.3 (10.3)    Imaging: Ct Abdomen Wo Contrast  10/16/2014   CLINICAL DATA:  54 year old female follow-up right perinephric fluid drainage  EXAM: CT ABDOMEN WITHOUT CONTRAST  TECHNIQUE: Multidetector CT imaging of the abdomen was performed following the standard protocol without IV contrast.  COMPARISON:  Prior CT scan 10/12/2014  FINDINGS: Lower Chest: The lung bases are clear. Visualized cardiac structures are within normal limits for size. No pericardial effusion. Unremarkable visualized distal thoracic esophagus.  Abdomen: Unenhanced CT  was performed per clinician order. Lack of IV contrast limits sensitivity and specificity, especially for evaluation of abdominal/pelvic solid viscera. Within these limitations, unremarkable CT appearance of the stomach, duodenum, spleen, adrenal glands and pancreas. Normal hepatic contour morphology. No discrete hepatic lesion. Gallbladder is surgically absent. No intra or extrahepatic biliary ductal dilatation.  Near-total interval resolution of the large right subcapsular perinephric fluid collection. The percutaneous drainage catheter is well positioned just posterior to the kidney. There is trace residual subcapsular fluid along the lateral inferior margin of the kidney measuring only up to 8 mm in width. There is significantly reduced mass effect on the kidney. No evidence of nephrolithiasis. Continued mild prominence of the right renal collecting system with an incompletely imaged right double-J ureteral stent in place. The renal collecting system is now water attenuation compared to high attenuation as seen previously. Unremarkable left kidney.  Very mild perinephric stranding bilaterally which is nonspecific. No evidence of bowel obstruction or focal bowel wall thickening in the visualized portions of the bowel.  Bones/Soft Tissues: No acute fracture or aggressive appearing lytic or blastic osseous lesion.  Vascular: Limited evaluation in the absence of IV contrast. No aneurysm.  IMPRESSION: 1. Near-total interval resolution of the large right subcapsular perinephric fluid collection status post percutaneous drainage. The drainage catheter remains well positioned. There is significantly less mass effect on the renal parenchyma. A very small amount of residual subcapsular fluid is noted inferiorly and laterally. If  the drain output is significantly reduced (no more than 10-20 mL daily) the percutaneous drain can likely be removed. 2. Persistent fullness of the right renal collecting system with an  incompletely imaged double-J ureteral stent in place. The renal collecting system is now low in attenuation rather than high attenuation is previously seen suggesting resolution of blood products.   Electronically Signed   By: Jacqulynn Cadet M.D.   On: 10/16/2014 10:08   US Renal  10/12/2014   CLINICAL DATA:  Acute onset of right-sided flank pain for 2 days. Fever. Status post CT-guided drainage of right-sided perinephric fluid. Subsequent encounter.  EXAM: RENAL / URINARY TRACT ULTRASOUND COMPLETE  COMPARISON:  CT of the abdomen and pelvis performed earlier today at 1:22 a.m.  FINDINGS: Right Kidney:  Length: 13.6 cm. The previously noted large right-sided perinephric fluid collection has resolved. The associated drainage catheter is noted adjacent to the right kidney. There is underlying persistent severe right-sided hydronephrosis. The nature of the fluid within the right renal collecting system is not well assessed on ultrasound.  Left Kidney:  Length: 14.1 cm. Echogenicity within normal limits. No mass or hydronephrosis visualized.  Bladder:  Decompressed, with a Foley catheter in place.  The uterus is enlarged, as noted on prior CT, measuring 12.3 x 8.9 x 9.4 cm, and fluid echogenicity is noted filling the endometrial echo complex, measuring 7.3 x 6.4 cm.  IMPRESSION: 1. Interval resolution of previously noted large right-sided perinephric fluid collection. Associated drainage catheter now seen adjacent to the right kidney. 2. Underlying persistent severe right-sided hydronephrosis. The nature of the fluid within the right renal collecting system is not well assessed on ultrasound. 3. Enlarged uterus again noted, with fluid echogenicity filling the endometrial echo complex. As noted on CT, this could reflect postradiation necrosis, underlying malignancy or possibly obstruction at the level of the cervix.   Electronically Signed   By: Garald Balding M.D.   On: 10/12/2014 19:35   Ct Image Guided Drainage  Percut Cath  Peritoneal Retroperit  10/12/2014   CLINICAL DATA:  Right perinephric fluid collection, fever, bacteremia  EXAM: CT-GUIDED RIGHT PERINEPHRIC FLUID COLLECTION DRAIN INSERTION  Date:  5/17/20165/17/2016 3:10 pm  Radiologist:  M. Daryll Brod, MD  Guidance:  ULTRASOUND AND FLUOROSCOPIC  FLUOROSCOPY TIME:  NONE.  MEDICATIONS AND MEDICAL HISTORY: 1% lidocaine locally, 0.5 mg Versed, 25 mcg fentanyl  ANESTHESIA/SEDATION: 15 minutes  CONTRAST:  None.  COMPLICATIONS: None immediate  PROCEDURE: Informed consent was obtained from the patient following explanation of the procedure, risks, benefits and alternatives. The patient understands, agrees and consents for the procedure. All questions were addressed. A time out was performed.  Maximal barrier sterile technique utilized including caps, mask, sterile gowns, sterile gloves, large sterile drape, hand hygiene, and Betadine.  Previous imaging reviewed. Patient positioned right anterior oblique. Noncontrast CT performed through the abdomen to demonstrate the large right perinephric fluid collection. Under sterile conditions and local anesthesia, an 18 gauge 10 cm access needle was advanced percutaneously into the collection. Needle position confirmed with CT. Guidewire advanced followed by tract dilatation to insert a 10 [place] drain. Catheter position confirmed with repeat imaging. Syringe aspiration yielded 635 cc dark thin fluid. Sample sent for Gram stain and culture. Catheter secured with a Prolene suture and connected to external suction bulb. Sterile dressing applied. No immediate complication. Patient tolerated the procedure well.  IMPRESSION: Successful CT-guided right perinephric abscess drain insertion.   Electronically Signed   By: Jerilynn Mages.  Shick M.D.  On: 10/12/2014 16:09    Labs:  CBC:  Recent Labs  10/13/14 0020 10/13/14 0643 10/15/14 0721 10/16/14 0420  WBC 16.2* 13.7* 10.3 14.3*  HGB 7.8* 8.2* 8.0* 8.6*  HCT 23.0* 24.7* 24.2* 27.2*    PLT 233 261 283 281    COAGS:  Recent Labs  10/12/14 0500  INR 1.42  APTT 35    BMP:  Recent Labs  10/13/14 0643 10/14/14 1021 10/15/14 0425 10/16/14 0420  NA 134* 133* 133* 134*  K 2.8* 3.0* 2.8* 3.7  CL 100* 101 101 98*  CO2 23 22 22 28   GLUCOSE 94 116* 90 93  BUN 11 14 12 9   CALCIUM 7.0* 7.6* 7.2* 7.9*  CREATININE 1.09* 0.84 0.95 0.91  GFRNONAA 57* >60 >60 >60  GFRAA >60 >60 >60 >60    LIVER FUNCTION TESTS:  Recent Labs  10/11/14 2042  BILITOT 1.1  AST 27  ALT 19  ALKPHOS 106  PROT 7.8  ALBUMIN 2.5*    Assessment and Plan:  Rt perinephric drain intact New spike in temp 101 Now afeb Wbc from 10 to 14 today Output 30 cc in JP CT 5/20 does show almost resolved collection Will re check vitals and output in am  Signed: Leodan Bolyard A 10/16/2014, 1:13 PM   I spent a total of 15 Minutes in face to face in clinical consultation/evaluation, greater than 50% of which was counseling/coordinating care for R perinephric collection drain

## 2014-10-16 NOTE — Progress Notes (Addendum)
Patient Demographics  Stacy Webster, is a 54 y.o. female, DOB - Jul 09, 1960, XAJ:[id number]  Admit date - 10/11/2014   Admitting Physician Collene Gobble, MD  Outpatient Primary MD for the patient is [name], MD  LOS - 4   Chief Complaint  Patient presents with  . Fever  . Abdominal Pain      INITIAL PRESENTATION: 54 yo woman with cervical CA just completed neo-adjuvant chemo/XRT at [facility]. Presented w R flank pain and fever, found to have large R perinephric fluid collection and suspected ureteral obstruction. Developed shock in ED despite IVF. Evaluated by Urology and IR. She underwent cystoscopy with right ureteric extent placement along with right perinephric drain placement under the guidance of urology. She has been placed on IV antibiotics. She was stabilized and transferred to hospitalist service on 10/15/2014.  Subjective:   Stacy Webster today has, No headache, No chest pain, mild lower abdominal pain - No Nausea, No new weakness tingling or numbness, No Cough - SOB.   Assessment & Plan    1. Sepsis with Peptostreptococcus bacteremia secondary to combination of right-sided hydronephrosis, right-sided perinephric fluid collection along with postradiation pelvic inflammation around the small bowel, this was not visualized on repeat CT scan on 10/16/2014. Sepsis is resolved after appropriate supportive care in ICU under the care of pulmonary crit care. She is now transferred to the floor. She is hemodynamically stable and temperature curve is improving, mild Leukocytosis, her case was discussed with urologist [name] and ID physician Dr. Drucilla Schmidt by me on 10/15/2014.   We will place her on IV Rocephin based on her culture data. Repeat CT scan stable 10-16-14. Repeat blood cultures drawn  10-16-14. If stable place on oral Vantin/ceftin with Flagyl for 10 more days and discharge home.   2. Cervical cancer. Was getting treatment at no month, he want to see a new oncologist. Appointment set with Dr. [name] for early next month. [place]   3. C. difficile colitis. Placed on Flagyl on 10/15/2014.   4. Hypokalemia - replaced, monitor.   5. Mild-to-moderate protein calorie malnutrition and poor appetite. Placed on nutritional supplements.      Code Status: Full  Family Communication: sister in law  Disposition Plan: [place]  Urology, PCCM, ID Dr Edsel Petrin over the phone   Procedures    5/17 ct abd as noted 5/17 renal US after fluid drainage >> continued hydronephrosis  TTE 5/18 >> normal LV and RV fxn, normal valves -  Left ventricle: The cavity size was normal. Wall thickness wasincreased in a pattern of mild LVH. Systolic function was normal. The estimated ejection fraction was in the range of 55% to 60%. Wall motion was normal; there were no regional wall motion abnormalities. Left ventricular diastolic function parameters were normal.  Perc drainage of R perinephric fluid collection 5/17 Cystoscopy and R ureteral stent placement 5/18 CT scan abdomen and pelvis 10/16/2014.   DVT Prophylaxis  Heparin   Lab Results  Component Value Date   PLT 281 10/16/2014    Medications  Scheduled Meds: . cefTRIAXone (ROCEPHIN)  IV  1 g Intravenous Q24H  . heparin subcutaneous  5,000 Units Subcutaneous 3 times per day  .  lactose free nutrition  237 mL Oral TID WC  . metroNIDAZOLE  500 mg Oral 3 times per day  . pantoprazole  40 mg Oral Daily   Continuous Infusions:   PRN Meds:.acetaminophen, albuterol, fentaNYL (SUBLIMAZE) injection, LORazepam, ondansetron, promethazine  Antibiotics     Anti-infectives    Start     Dose/Rate Route Frequency Ordered Stop   10/15/14 2200  cefTRIAXone (ROCEPHIN) 1 g in dextrose 5 % 50 mL IVPB -  Premix     1 g 100 mL/hr over 30 Minutes Intravenous Every 24 hours 10/15/14 1444     10/15/14 1600  metroNIDAZOLE (FLAGYL) tablet 500 mg     500 mg Oral 3 times per day 10/15/14 1444     10/15/14 1100  Ampicillin-Sulbactam (UNASYN) 3 g in sodium chloride 0.9 % 100 mL IVPB  Status:  Discontinued     3 g 100 mL/hr over 60 Minutes Intravenous Every 6 hours 10/15/14 1002 10/15/14 1444   10/12/14 2200  cefTRIAXone (ROCEPHIN) 2 g in dextrose 5 % 50 mL IVPB  Status:  Discontinued     2 g 100 mL/hr over 30 Minutes Intravenous Every 24 hours 10/11/14 2101 10/15/14 1002   10/12/14 1200  vancomycin (VANCOCIN) IVPB 750 mg/150 ml premix  Status:  Discontinued     750 mg 150 mL/hr over 60 Minutes Intravenous Every 12 hours 10/12/14 0857 10/14/14 1131   10/12/14 0815  vancomycin (VANCOCIN) IVPB 1000 mg/200 mL premix  Status:  Discontinued     1,000 mg 200 mL/hr over 60 Minutes Intravenous STAT 10/12/14 0807 10/12/14 0809   10/12/14 0000  vancomycin (VANCOCIN) 1,250 mg in sodium chloride 0.9 % 250 mL IVPB     1,250 mg 166.7 mL/hr over 90 Minutes Intravenous  Once 10/11/14 2352 10/12/14 0151   10/11/14 2100  cefTRIAXone (ROCEPHIN) 2 g in dextrose 5 % 50 mL IVPB     2 g 100 mL/hr over 30 Minutes Intravenous  Once 10/11/14 2053 10/11/14 2258        Objective:   Filed Vitals:   10/15/14 1425 10/15/14 2120 10/15/14 2346 10/16/14 0519  BP: 128/60 126/79  131/62  Pulse: 92 100  86  Temp: 99.8 F (37.7 C) 101 F (38.3 C) 98.4 F (36.9 C) 98.9 F (37.2 C)  TempSrc: Oral Oral Oral Oral  Resp: 18 20  20   Height:      Weight:    78.1 kg (172 lb 2.9 oz)  SpO2: 100% 100%  100%    Wt Readings from Last 3 Encounters:  10/16/14 78.1 kg (172 lb 2.9 oz)     Intake/Output Summary (Last 24 hours) at 10/16/14 1121 Last data filed at 10/16/14 0900  Gross per 24 hour  Intake    565 ml  Output     10 ml  Net    555 ml     Physical Exam  Awake Alert, Oriented X 3, No new F.N deficits, Normal  affect [place].AT,PERRAL Supple Neck,No JVD, No cervical lymphadenopathy appriciated.  Symmetrical Chest wall movement, Good air movement bilaterally, CTAB RRR,No Gallops,Rubs or new Murmurs, No Parasternal Heave +ve B.Sounds, Abd Soft, No tenderness, No organomegaly appriciated, No rebound - guarding or rigidity. No Cyanosis, Clubbing or edema, No new Rash or bruise , L JP drain   Data Review   Micro Results Recent Results (from the past 240 hour(s))  Culture, blood (routine x 2)     Status: None   Collection Time: 10/11/14  8:30 PM  Result Value Ref Range Status   Specimen Description BLOOD RAC  Final   Special Requests BOTTLES DRAWN AEROBIC AND ANAEROBIC 5CC  Final   Culture   Final    PEPTOSTREPTOCOCCUS SPECIES Note: Gram Stain Report Called to,Read Back By and Verified With: JANELL GARLAND @2040  10/12/14 Performed at [facility]    Report Status 10/14/2014 FINAL  Final  Culture, blood (routine x 2)     Status: None   Collection Time: 10/11/14  8:35 PM  Result Value Ref Range Status   Specimen Description BLOOD RWRIST  Final   Special Requests BOTTLES DRAWN AEROBIC AND ANAEROBIC 5CC  Final   Culture   Final    PEPTOSTREPTOCOCCUS SPECIES Note: Gram Stain Report Called to,Read Back By and Verified With:  JANELL GARLAND @2040  10/12/14 Performed at [facility]    Report Status 10/14/2014 FINAL  Final  Urine culture     Status: None   Collection Time: 10/11/14 10:15 PM  Result Value Ref Range Status   Specimen Description URINE, CLEAN CATCH  Final   Special Requests NONE  Final   Colony Count   Final    30,000 COLONIES/ML Performed at [facility]    Culture   Final    Multiple bacterial morphotypes present, none predominant. Suggest appropriate recollection if clinically indicated. Performed at [facility]    Report Status 10/12/2014 FINAL  Final  MRSA PCR Screening     Status: None   Collection Time: 10/12/14 11:12 AM  Result Value Ref  Range Status   MRSA by PCR NEGATIVE NEGATIVE Final    Comment:        The GeneXpert MRSA Assay (FDA approved for NASAL specimens only), is one component of a comprehensive MRSA colonization surveillance program. It is not intended to diagnose MRSA infection nor to guide or monitor treatment for MRSA infections.   Culture, routine-abscess     Status: None   Collection Time: 10/12/14  3:44 PM  Result Value Ref Range Status   Specimen Description ABSCESS  Final   Special Requests Normal  Final   Gram Stain   Final    RARE WBC PRESENT, PREDOMINANTLY PMN NO SQUAMOUS EPITHELIAL CELLS SEEN NO ORGANISMS SEEN Performed at [facility]    Culture   Final    NO GROWTH 3 DAYS Performed at [facility]    Report Status 10/16/2014 FINAL  Final  Anaerobic culture     Status: None (Preliminary result)   Collection Time: 10/12/14  3:44 PM  Result Value Ref Range Status   Specimen Description ABSCESS  Final   Special Requests Normal  Final   Gram Stain   Final    RARE WBC PRESENT, PREDOMINANTLY PMN NO SQUAMOUS EPITHELIAL CELLS SEEN NO ORGANISMS SEEN Performed at [facility]    Culture   Final    NO ANAEROBES ISOLATED; CULTURE IN PROGRESS FOR 5 DAYS Performed at [facility]    Report Status PENDING  Incomplete  Clostridium Difficile by PCR     Status: Abnormal   Collection Time: 10/15/14 12:36 PM  Result Value Ref Range Status   C difficile by pcr POSITIVE (A) NEGATIVE Final    Comment: CRITICAL RESULT CALLED TO, READ BACK BY AND VERIFIED WITH: Marylou Mccoy [id number] @ [place]     Radiology Reports Ct Abdomen Wo Contrast  10/16/2014   CLINICAL DATA:  54 year old female follow-up right perinephric fluid drainage  EXAM: CT ABDOMEN  WITHOUT CONTRAST  TECHNIQUE: Multidetector CT imaging of the abdomen was performed following the standard protocol without IV contrast.  COMPARISON:  Prior CT scan 10/12/2014  FINDINGS: Lower Chest: The lung  bases are clear. Visualized cardiac structures are within normal limits for size. No pericardial effusion. Unremarkable visualized distal thoracic esophagus.  Abdomen: Unenhanced CT was performed per clinician order. Lack of IV contrast limits sensitivity and specificity, especially for evaluation of abdominal/pelvic solid viscera. Within these limitations, unremarkable CT appearance of the stomach, duodenum, spleen, adrenal glands and pancreas. Normal hepatic contour morphology. No discrete hepatic lesion. Gallbladder is surgically absent. No intra or extrahepatic biliary ductal dilatation.  Near-total interval resolution of the large right subcapsular perinephric fluid collection. The percutaneous drainage catheter is well positioned just posterior to the kidney. There is trace residual subcapsular fluid along the lateral inferior margin of the kidney measuring only up to 8 mm in width. There is significantly reduced mass effect on the kidney. No evidence of nephrolithiasis. Continued mild prominence of the right renal collecting system with an incompletely imaged right double-J ureteral stent in place. The renal collecting system is now water attenuation compared to high attenuation as seen previously. Unremarkable left kidney.  Very mild perinephric stranding bilaterally which is nonspecific. No evidence of bowel obstruction or focal bowel wall thickening in the visualized portions of the bowel.  Bones/Soft Tissues: No acute fracture or aggressive appearing lytic or blastic osseous lesion.  Vascular: Limited evaluation in the absence of IV contrast. No aneurysm.  IMPRESSION: 1. Near-total interval resolution of the large right subcapsular perinephric fluid collection status post percutaneous drainage. The drainage catheter remains well positioned. There is significantly less mass effect on the renal parenchyma. A very small amount of residual subcapsular fluid is noted inferiorly and laterally. If the drain  output is significantly reduced (no more than 10-20 mL daily) the percutaneous drain can likely be removed. 2. Persistent fullness of the right renal collecting system with an incompletely imaged double-J ureteral stent in place. The renal collecting system is now low in attenuation rather than high attenuation is previously seen suggesting resolution of blood products.   Electronically Signed   By: Jacqulynn Cadet M.D.   On: 10/16/2014 10:08   Ct Abdomen Pelvis W Contrast  10/12/2014   CLINICAL DATA:  Subacute onset of fever for 3 weeks. Elevated lactic acid. 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 Initial encounter.  EXAM: CT ABDOMEN AND PELVIS WITH CONTRAST  TECHNIQUE: Multidetector CT imaging of the abdomen and pelvis was performed using the standard protocol following bolus administration of intravenous contrast.  CONTRAST:  126mL OMNIPAQUE IOHEXOL 300 MG/ML  SOLN  COMPARISON:  None.  FINDINGS: The visualized lung bases are clear.  The liver and spleen are unremarkable in appearance. The patient is status post cholecystectomy, with clips noted along the gallbladder fossa. The pancreas and adrenal glands are unremarkable.  A large amount of blood is noted within the right renal calyces and right renal pelvis, and extending along the course of the right ureter. 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 This likely reflects ureteral obstruction, with blood filling the right renal collecting system.  The left kidney is unremarkable in appearance.  The small bowel is unremarkable in appearance. The stomach is within normal limits. No acute vascular abnormalities are seen.  The appendix is not definitely characterized; there is no evidence of appendicitis. Contrast progresses to  the level of the rectum. The colon is unremarkable in appearance.  There is diffuse soft tissue stranding and fluid within the pelvis.  This may reflect postradiation change, or possibly acute infection or inflammation. This tracks about the adjacent distal ileum.  The uterus is enlarged, and contains central fluid attenuation measuring 8.8 cm in size. This may reflect the underlying malignancy, or postradiation necrosis. An underlying calcified fibroid is seen on the left. Heterogeneous attenuation is noted extending to the cervix, and there is diffuse soft tissue attenuation extending into the right side of the base of the bladder. This raises concern for tumor infiltration, though postradiation change could have a similar appearance. This likely explains right ureteral obstruction, as described above.  A 4.4 cm cystic focus is noted at the right ovary, likely physiologic in nature.  The bladder is mildly distended. Mild surrounding soft tissue inflammation may reflect postradiation change or possibly cystitis. No inguinal lymphadenopathy is seen.  No acute osseous abnormalities are identified. There is chronic loss of height involving the superior endplate of L3.  IMPRESSION: 1. Large amount of blood noted within the right renal calyces and right renal pelvis, extending along the course of the right ureter. 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 This likely reflects ureteral obstruction, with blood filling the right renal collecting system. 2. Right ureteral obstruction appears to reflect diffuse soft tissue attenuation extending into the right side of the base of bladder. This raises concern for tumor infiltration, though postradiation change could have a similar appearance. 3. Enlarged uterus, containing central fluid attenuation, measuring 8.8 cm. This may reflect the underlying malignancy, or postradiation necrosis. Heterogeneous attenuation extends to the cervix. 4. Diffuse soft tissue inflammation and fluid within the pelvis. This may reflect postradiation change, or possibly acute  infection or inflammation. This tracks about the adjacent distal ileum. 5. Mild soft tissue inflammation about the bladder may reflect postradiation change or possibly cystitis.  These results were called by telephone at the time of interpretation on 10/12/2014 at 2:01 am to Dr. Linton Flemings, who verbally acknowledged these results.   Electronically Signed   By: Garald Balding M.D.   On: 10/12/2014 02:23   US Renal  10/12/2014   CLINICAL DATA:  Acute onset of right-sided flank pain for 2 days. Fever. Status post CT-guided drainage of right-sided perinephric fluid. Subsequent encounter.  EXAM: RENAL / URINARY TRACT ULTRASOUND COMPLETE  COMPARISON:  CT of the abdomen and pelvis performed earlier today at 1:22 a.m.  FINDINGS: Right Kidney:  Length: 13.6 cm. The previously noted large right-sided perinephric fluid collection has resolved. The associated drainage catheter is noted adjacent to the right kidney. There is underlying persistent severe right-sided hydronephrosis. The nature of the fluid within the right renal collecting system is not well assessed on ultrasound.  Left Kidney:  Length: 14.1 cm. Echogenicity within normal limits. No mass or hydronephrosis visualized.  Bladder:  Decompressed, with a Foley catheter in place.  The uterus is enlarged, as noted on prior CT, measuring 12.3 x 8.9 x 9.4 cm, and fluid echogenicity is noted filling the endometrial echo complex, measuring 7.3 x 6.4 cm.  IMPRESSION: 1. Interval resolution of previously noted large right-sided perinephric fluid collection. Associated drainage catheter now seen adjacent to the right kidney. 2. Underlying persistent severe right-sided hydronephrosis. The nature of the fluid within the right renal collecting system is not well assessed on ultrasound. 3. Enlarged uterus again noted, with fluid echogenicity filling  the endometrial echo complex. As noted on CT, this could reflect postradiation necrosis, underlying malignancy or possibly  obstruction at the level of the cervix.   Electronically Signed   By: Garald Balding M.D.   On: 10/12/2014 19:35   Dg Chest Port 1 View  10/11/2014   CLINICAL DATA:  54 year old female with 5 week history of fever. Medical history includes uterine and cervical cancer.  EXAM: PORTABLE CHEST - 1 VIEW  COMPARISON:  None.  FINDINGS: The lungs are clear and negative for focal airspace consolidation, pulmonary edema or suspicious pulmonary nodule. No pleural effusion or pneumothorax. Cardiac and mediastinal contours are within normal limits. No acute fracture or lytic or blastic osseous lesions. The visualized upper abdominal bowel gas pattern is unremarkable.  IMPRESSION: No active cardiopulmonary disease.   Electronically Signed   By: Jacqulynn Cadet M.D.   On: 10/11/2014 21:20   Ct Image Guided Drainage Percut Cath  Peritoneal Retroperit  10/12/2014   CLINICAL DATA:  Right perinephric fluid collection, fever, bacteremia  EXAM: CT-GUIDED RIGHT PERINEPHRIC FLUID COLLECTION DRAIN INSERTION  Date:  5/17/20165/17/2016 3:10 pm  Radiologist:  M. Daryll Brod, MD  Guidance:  ULTRASOUND AND FLUOROSCOPIC  FLUOROSCOPY TIME:  NONE.  MEDICATIONS AND MEDICAL HISTORY: 1% lidocaine locally, 0.5 mg Versed, 25 mcg fentanyl  ANESTHESIA/SEDATION: 15 minutes  CONTRAST:  None.  COMPLICATIONS: None immediate  PROCEDURE: Informed consent was obtained from the patient following explanation of the procedure, risks, benefits and alternatives. The patient understands, agrees and consents for the procedure. All questions were addressed. A time out was performed.  Maximal barrier sterile technique utilized including caps, mask, sterile gowns, sterile gloves, large sterile drape, hand hygiene, and Betadine.  Previous imaging reviewed. Patient positioned right anterior oblique. Noncontrast CT performed through the abdomen to demonstrate the large right perinephric fluid collection. Under sterile conditions and local anesthesia, an 18 gauge 10  cm access needle was advanced percutaneously into the collection. Needle position confirmed with CT. Guidewire advanced followed by tract dilatation to insert a 10 [place] drain. Catheter position confirmed with repeat imaging. Syringe aspiration yielded 635 cc dark thin fluid. Sample sent for Gram stain and culture. Catheter secured with a Prolene suture and connected to external suction bulb. Sterile dressing applied. No immediate complication. Patient tolerated the procedure well.  IMPRESSION: Successful CT-guided right perinephric abscess drain insertion.   Electronically Signed   By: Jerilynn Mages.  Shick M.D.   On: 10/12/2014 16:09     CBC  Recent Labs Lab 10/11/14 2042 10/12/14 0920 10/13/14 0020 10/13/14 0643 10/15/14 0721 10/16/14 0420  WBC 8.7 16.9* 16.2* 13.7* 10.3 14.3*  HGB 7.3* 5.0* 7.8* 8.2* 8.0* 8.6*  HCT 23.1* 15.5* 23.0* 24.7* 24.2* 27.2*  PLT 409* 286 233 261 283 281  MCV 94.3 93.9 88.1 87.6 88.6 90.4  MCH 29.8 30.3 29.9 29.1 29.3 28.6  MCHC 31.6 32.3 33.9 33.2 33.1 31.6  RDW 20.3* 20.3* 20.7* 20.9* 20.8* 20.4*  LYMPHSABS 0.1*  --   --   --   --   --   MONOABS 0.1  --   --   --   --   --   EOSABS 0.0  --   --   --   --   --   BASOSABS 0.0  --   --   --   --   --     Chemistries   Recent Labs Lab 10/11/14 2042 10/12/14 0920 10/13/14 0643 10/14/14 1021 10/15/14 0425 10/16/14 0420  NA  128*  --  134* 133* 133* 134*  K 2.9*  --  2.8* 3.0* 2.8* 3.7  CL 92*  --  100* 101 101 98*  CO2 25  --  23 22 22 28   GLUCOSE 110*  --  94 116* 90 93  BUN 11  --  11 14 12 9   CREATININE 1.16* 0.98 1.09* 0.84 0.95 0.91  CALCIUM 8.1*  --  7.0* 7.6* 7.2* 7.9*  MG  --  1.4*  --   --   --  1.8  AST 27  --   --   --   --   --   ALT 19  --   --   --   --   --   ALKPHOS 106  --   --   --   --   --   BILITOT 1.1  --   --   --   --   --    ------------------------------------------------------------------------------------------------------------------ estimated creatinine clearance is 72.3  mL/min (by C-G formula based on Cr of 0.91). ------------------------------------------------------------------------------------------------------------------ No results for input(s): HGBA1C in the last 72 hours. ------------------------------------------------------------------------------------------------------------------ No results for input(s): CHOL, HDL, LDLCALC, TRIG, CHOLHDL, LDLDIRECT in the last 72 hours. ------------------------------------------------------------------------------------------------------------------ No results for input(s): TSH, T4TOTAL, T3FREE, THYROIDAB in the last 72 hours.  Invalid input(s): FREET3 ------------------------------------------------------------------------------------------------------------------ No results for input(s): VITAMINB12, FOLATE, FERRITIN, TIBC, IRON, RETICCTPCT in the last 72 hours.  Coagulation profile  Recent Labs Lab 10/12/14 0500  INR 1.42    No results for input(s): DDIMER in the last 72 hours.  Cardiac Enzymes No results for input(s): CKMB, TROPONINI, MYOGLOBIN in the last 168 hours.  Invalid input(s): CK ------------------------------------------------------------------------------------------------------------------ Invalid input(s): POCBNP   Time Spent in minutes  35   Abbygael Curtiss K M.D on 10/16/2014 at 11:21 AM  Between 7am to 7pm - Pager - [phone]  After 7pm go to [email or web address] - password [facility]  [facility]   Office  [phone]

## 2014-10-16 NOTE — Progress Notes (Signed)
PT Cancellation/Screen Note  Patient Details Name: Stacy Webster MRN: [id number] DOB: 1960-09-05   Cancelled Treatment:    Reason Eval/Treat Not Completed: PT screened, no needs identified, will sign off   Weston Anna, MPT Pager: [phone]

## 2014-10-17 LAB — ANAEROBIC CULTURE: Special Requests: NORMAL

## 2014-10-17 LAB — COMPREHENSIVE METABOLIC PANEL WITH GFR
ALT: 9 U/L — ABNORMAL LOW (ref 14–54)
AST: 10 U/L — ABNORMAL LOW (ref 15–41)
Albumin: 1.7 g/dL — ABNORMAL LOW (ref 3.5–5.0)
Alkaline Phosphatase: 63 U/L (ref 38–126)
Anion gap: 7 (ref 5–15)
BUN: 8 mg/dL (ref 6–20)
CO2: 29 mmol/L (ref 22–32)
Calcium: 7.5 mg/dL — ABNORMAL LOW (ref 8.9–10.3)
Chloride: 96 mmol/L — ABNORMAL LOW (ref 101–111)
Creatinine, Ser: 0.94 mg/dL (ref 0.44–1.00)
GFR calc Af Amer: >60 mL/min (ref >60)
GFR calc non Af Amer: >60 mL/min (ref >60)
Glucose, Bld: 102 mg/dL — ABNORMAL HIGH (ref 65–99)
Potassium: 3.5 mmol/L (ref 3.5–5.1)
Sodium: 132 mmol/L — ABNORMAL LOW (ref 135–145)
Total Bilirubin: 0.6 mg/dL (ref 0.3–1.2)
Total Protein: 5.7 g/dL — ABNORMAL LOW (ref 6.5–8.1)

## 2014-10-17 LAB — URIC ACID: Uric Acid, Serum: 4.2 mg/dL (ref 2.3–6.6)

## 2014-10-17 LAB — CBC
HCT: 24.1 % — ABNORMAL LOW (ref 36.0–46.0)
HEMOGLOBIN: 7.9 g/dL — AB (ref 12.0–15.0)
MCH: 29.3 pg (ref 26.0–34.0)
MCHC: 32.8 g/dL (ref 30.0–36.0)
MCV: 89.3 fL (ref 78.0–100.0)
PLATELETS: 292 10*3/uL (ref 150–400)
RBC: 2.7 MIL/uL — ABNORMAL LOW (ref 3.87–5.11)
RDW: 20.2 % — ABNORMAL HIGH (ref 11.5–15.5)
WBC: 13 10*3/uL — ABNORMAL HIGH (ref 4.0–10.5)

## 2014-10-17 LAB — OSMOLALITY: OSMOLALITY: 265 mosm/kg — AB (ref 275–300)

## 2014-10-17 LAB — MAGNESIUM: Magnesium: 1.6 mg/dL — ABNORMAL LOW (ref 1.7–2.4)

## 2014-10-17 MED ORDER — POTASSIUM CHLORIDE CRYS ER 20 MEQ PO TBCR
40.0000 meq | EXTENDED_RELEASE_TABLET | ORAL | Status: AC
  Administered 2014-10-17 (×2): 40 meq via ORAL
  Filled 2014-10-17 (×2): qty 2

## 2014-10-17 MED ORDER — MAGNESIUM SULFATE 2 GM/50ML IV SOLN
2.0000 g | Freq: Once | INTRAVENOUS | Status: AC
  Administered 2014-10-17: 2 g via INTRAVENOUS
  Filled 2014-10-17: qty 50

## 2014-10-17 MED ORDER — SODIUM CHLORIDE 0.9 % IV SOLN
INTRAVENOUS | Status: AC
  Administered 2014-10-17: 14:00:00 via INTRAVENOUS

## 2014-10-17 NOTE — Progress Notes (Signed)
Patient Demographics  Stacy Webster, is a 54 y.o. female, DOB - [birth year], TXM:[id number]  Admit date - 10/11/2014   Admitting Physician Collene Gobble, MD  Outpatient Primary MD for the patient is [name], MD  LOS - 5   Chief Complaint  Patient presents with  . Fever  . Abdominal Pain      INITIAL PRESENTATION: 54 yo woman with cervical CA just completed neo-adjuvant chemo/XRT at [facility]. Presented w R flank pain and fever, found to have large R perinephric fluid collection and suspected ureteral obstruction. Developed shock in ED despite IVF. Evaluated by Urology and IR. She underwent cystoscopy with right ureteric extent placement along with right perinephric drain placement under the guidance of urology. She has been placed on IV antibiotics. She was stabilized and transferred to hospitalist service on 10/15/2014.  Subjective:   Stacy Webster today has, No headache, No chest pain, mild lower abdominal pain - No Nausea, No new weakness tingling or numbness, No Cough - SOB. Feels much better today.  Assessment & Plan    1. Sepsis with Peptostreptococcus bacteremia secondary to combination of right-sided hydronephrosis, right-sided perinephric fluid collection along with postradiation pelvic inflammation around the small bowel, this was not visualized on repeat CT scan on 10/16/2014. Sepsis is resolved after appropriate supportive care in ICU under the care of pulmonary crit care. She is now transferred to the floor. She is hemodynamically stable and temperature curve is improving, mild Leukocytosis, her case was discussed with urologist [name] and ID physician Dr. Drucilla Schmidt by me on 10/15/2014.   We will place her on IV Rocephin based on her culture data. Repeat CT scan stable 10-16-14.  Repeat blood cultures drawn 10-16-14. If stable place on oral Vantin/ceftin with Flagyl for 10 more days and discharge home.   2. Cervical cancer. Was getting treatment at no month, he want to see a new oncologist. Appointment set with Dr. [name] for early next month. [place]   3. C. difficile colitis. Placed on Flagyl on 10/15/2014. Diarrhea much improved now down to one to 2 bowel movements a day.   4. Hypokalemia and hypomagnesemia - replaced, monitor.   5. Mild-to-moderate protein calorie malnutrition and poor appetite. Placed on nutritional supplements. Gentle hydration for 24 hours on 10/17/2014.   6. Mild hyponatremia. Likely due to dehydration, gently hydrate, obtained serum osmolality, urine osmolality-sodium. Repeat BMP in the morning.     Code Status: Full  Family Communication: sister in law  Disposition Plan: [address]  Urology, PCCM, ID Dr Edsel Petrin over the phone   Procedures    5/17 ct abd as noted 5/17 renal US after fluid drainage >> continued hydronephrosis  TTE 5/18 >> normal LV and RV fxn, normal valves -  Left ventricle: The cavity size was normal. Wall thickness wasincreased in a pattern of mild LVH. Systolic function was normal. The estimated ejection fraction was in the range of 55% to 60%. Wall motion was normal; there were no regional wall motion abnormalities. Left ventricular diastolic function parameters were normal.  Perc drainage of R perinephric fluid collection 5/17 Cystoscopy and R ureteral stent placement 5/18 CT scan abdomen and pelvis 10/16/2014.   DVT Prophylaxis  Heparin   Lab Results  Component Value Date   PLT 292 10/17/2014    Medications  Scheduled Meds: . cefTRIAXone (ROCEPHIN)  IV  1 g Intravenous Q24H  . heparin subcutaneous  5,000 Units Subcutaneous 3 times per day  . lactose free nutrition  237 mL Oral TID WC  . metroNIDAZOLE  500 mg Oral 3 times per day  . pantoprazole  40 mg Oral  Daily   Continuous Infusions:   PRN Meds:.acetaminophen, albuterol, fentaNYL (SUBLIMAZE) injection, LORazepam, ondansetron, promethazine  Antibiotics     Anti-infectives    Start     Dose/Rate Route Frequency Ordered Stop   10/15/14 2200  cefTRIAXone (ROCEPHIN) 1 g in dextrose 5 % 50 mL IVPB - Premix     1 g 100 mL/hr over 30 Minutes Intravenous Every 24 hours 10/15/14 1444     10/15/14 1600  metroNIDAZOLE (FLAGYL) tablet 500 mg     500 mg Oral 3 times per day 10/15/14 1444     10/15/14 1100  Ampicillin-Sulbactam (UNASYN) 3 g in sodium chloride 0.9 % 100 mL IVPB  Status:  Discontinued     3 g 100 mL/hr over 60 Minutes Intravenous Every 6 hours 10/15/14 1002 10/15/14 1444   10/12/14 2200  cefTRIAXone (ROCEPHIN) 2 g in dextrose 5 % 50 mL IVPB  Status:  Discontinued     2 g 100 mL/hr over 30 Minutes Intravenous Every 24 hours 10/11/14 2101 10/15/14 1002   10/12/14 1200  vancomycin (VANCOCIN) IVPB 750 mg/150 ml premix  Status:  Discontinued     750 mg 150 mL/hr over 60 Minutes Intravenous Every 12 hours 10/12/14 0857 10/14/14 1131   10/12/14 0815  vancomycin (VANCOCIN) IVPB 1000 mg/200 mL premix  Status:  Discontinued     1,000 mg 200 mL/hr over 60 Minutes Intravenous STAT 10/12/14 0807 10/12/14 0809   10/12/14 0000  vancomycin (VANCOCIN) 1,250 mg in sodium chloride 0.9 % 250 mL IVPB     1,250 mg 166.7 mL/hr over 90 Minutes Intravenous  Once 10/11/14 2352 10/12/14 0151   10/11/14 2100  cefTRIAXone (ROCEPHIN) 2 g in dextrose 5 % 50 mL IVPB     2 g 100 mL/hr over 30 Minutes Intravenous  Once 10/11/14 2053 10/11/14 2258        Objective:   Filed Vitals:   10/16/14 1425 10/16/14 1808 10/16/14 2106 10/17/14 0531  BP: 119/61  112/57 122/60  Pulse: 89  85 78  Temp: 100.3 F (37.9 C) 101 F (38.3 C) 98.5 F (36.9 C) 99 F (37.2 C)  TempSrc: Oral Oral Oral Oral  Resp: 16  18 16   Height:      Weight:    77.747 kg (171 lb 6.4 oz)  SpO2: 97%  98% 98%    Wt Readings from Last 3  Encounters:  10/17/14 77.747 kg (171 lb 6.4 oz)     Intake/Output Summary (Last 24 hours) at 10/17/14 1207 Last data filed at 10/17/14 0800  Gross per 24 hour  Intake   1065 ml  Output     35 ml  Net   1030 ml     Physical Exam  Awake Alert, Oriented X 3, No new F.N deficits, Normal affect [place].AT,PERRAL Supple Neck,No JVD, No cervical lymphadenopathy appriciated.  Symmetrical Chest wall movement, Good air movement bilaterally, CTAB RRR,No Gallops,Rubs or new Murmurs, No Parasternal Heave +ve B.Sounds, Abd Soft, No tenderness, No organomegaly appriciated, No rebound - guarding or rigidity. No Cyanosis, Clubbing or edema, No  new Rash or bruise , L JP drain   Data Review   Micro Results Recent Results (from the past 240 hour(s))  Culture, blood (routine x 2)     Status: None   Collection Time: 10/11/14  8:30 PM  Result Value Ref Range Status   Specimen Description BLOOD RAC  Final   Special Requests BOTTLES DRAWN AEROBIC AND ANAEROBIC 5CC  Final   Culture   Final    PEPTOSTREPTOCOCCUS SPECIES Note: Gram Stain Report Called to,Read Back By and Verified With: JANELL GARLAND @2040  10/12/14 Performed at [facility]    Report Status 10/14/2014 FINAL  Final  Culture, blood (routine x 2)     Status: None   Collection Time: 10/11/14  8:35 PM  Result Value Ref Range Status   Specimen Description BLOOD RWRIST  Final   Special Requests BOTTLES DRAWN AEROBIC AND ANAEROBIC 5CC  Final   Culture   Final    PEPTOSTREPTOCOCCUS SPECIES Note: Gram Stain Report Called to,Read Back By and Verified With:  JANELL GARLAND @2040  10/12/14 Performed at [facility]    Report Status 10/14/2014 FINAL  Final  Urine culture     Status: None   Collection Time: 10/11/14 10:15 PM  Result Value Ref Range Status   Specimen Description URINE, CLEAN CATCH  Final   Special Requests NONE  Final   Colony Count   Final    30,000 COLONIES/ML Performed at [facility]    Culture    Final    Multiple bacterial morphotypes present, none predominant. Suggest appropriate recollection if clinically indicated. Performed at [facility]    Report Status 10/12/2014 FINAL  Final  MRSA PCR Screening     Status: None   Collection Time: 10/12/14 11:12 AM  Result Value Ref Range Status   MRSA by PCR NEGATIVE NEGATIVE Final    Comment:        The GeneXpert MRSA Assay (FDA approved for NASAL specimens only), is one component of a comprehensive MRSA colonization surveillance program. It is not intended to diagnose MRSA infection nor to guide or monitor treatment for MRSA infections.   Culture, routine-abscess     Status: None   Collection Time: 10/12/14  3:44 PM  Result Value Ref Range Status   Specimen Description ABSCESS  Final   Special Requests Normal  Final   Gram Stain   Final    RARE WBC PRESENT, PREDOMINANTLY PMN NO SQUAMOUS EPITHELIAL CELLS SEEN NO ORGANISMS SEEN Performed at [facility]    Culture   Final    NO GROWTH 3 DAYS Performed at [facility]    Report Status 10/16/2014 FINAL  Final  Anaerobic culture     Status: None   Collection Time: 10/12/14  3:44 PM  Result Value Ref Range Status   Specimen Description ABSCESS  Final   Special Requests Normal  Final   Gram Stain   Final    RARE WBC PRESENT, PREDOMINANTLY PMN NO SQUAMOUS EPITHELIAL CELLS SEEN NO ORGANISMS SEEN Performed at [facility]    Culture   Final    NO ANAEROBES ISOLATED Performed at [facility]    Report Status 10/17/2014 FINAL  Final  Clostridium Difficile by PCR     Status: Abnormal   Collection Time: 10/15/14 12:36 PM  Result Value Ref Range Status   C difficile by pcr POSITIVE (A) NEGATIVE Final    Comment: CRITICAL RESULT CALLED TO, READ BACK BY AND VERIFIED  WITH: Marylou Mccoy [id number] @ 1426 BY J SCOTTON   Culture, blood (routine x 2)     Status: None (Preliminary result)   Collection Time: 10/16/14  7:55 AM  Result  Value Ref Range Status   Specimen Description BLOOD LEFT ARM  Final   Special Requests   Final    BOTTLES DRAWN AEROBIC AND ANAEROBIC 10CC BOTH BOTTLES   Culture   Final           BLOOD CULTURE RECEIVED NO GROWTH TO DATE CULTURE WILL BE HELD FOR 5 DAYS BEFORE ISSUING A FINAL NEGATIVE REPORT Performed at [facility]    Report Status PENDING  Incomplete  Culture, blood (routine x 2)     Status: None (Preliminary result)   Collection Time: 10/16/14  8:01 AM  Result Value Ref Range Status   Specimen Description BLOOD LEFT ARM  Final   Special Requests   Final    BOTTLES DRAWN AEROBIC AND ANAEROBIC 10CC BOTH BOTTLES   Culture   Final           BLOOD CULTURE RECEIVED NO GROWTH TO DATE CULTURE WILL BE HELD FOR 5 DAYS BEFORE ISSUING A FINAL NEGATIVE REPORT Performed at [facility]    Report Status PENDING  Incomplete    Radiology Reports Ct Abdomen Wo Contrast  10/16/2014   CLINICAL DATA:  54 year old female follow-up right perinephric fluid drainage  EXAM: CT ABDOMEN WITHOUT CONTRAST  TECHNIQUE: Multidetector CT imaging of the abdomen was performed following the standard protocol without IV contrast.  COMPARISON:  Prior CT scan 10/12/2014  FINDINGS: Lower Chest: The lung bases are clear. Visualized cardiac structures are within normal limits for size. No pericardial effusion. Unremarkable visualized distal thoracic esophagus.  Abdomen: Unenhanced CT was performed per clinician order. Lack of IV contrast limits sensitivity and specificity, especially for evaluation of abdominal/pelvic solid viscera. Within these limitations, unremarkable CT appearance of the stomach, duodenum, spleen, adrenal glands and pancreas. Normal hepatic contour morphology. No discrete hepatic lesion. Gallbladder is surgically absent. No intra or extrahepatic biliary ductal dilatation.  Near-total interval resolution of the large right subcapsular perinephric fluid collection. The percutaneous drainage  catheter is well positioned just posterior to the kidney. There is trace residual subcapsular fluid along the lateral inferior margin of the kidney measuring only up to 8 mm in width. There is significantly reduced mass effect on the kidney. No evidence of nephrolithiasis. Continued mild prominence of the right renal collecting system with an incompletely imaged right double-J ureteral stent in place. The renal collecting system is now water attenuation compared to high attenuation as seen previously. Unremarkable left kidney.  Very mild perinephric stranding bilaterally which is nonspecific. No evidence of bowel obstruction or focal bowel wall thickening in the visualized portions of the bowel.  Bones/Soft Tissues: No acute fracture or aggressive appearing lytic or blastic osseous lesion.  Vascular: Limited evaluation in the absence of IV contrast. No aneurysm.  IMPRESSION: 1. Near-total interval resolution of the large right subcapsular perinephric fluid collection status post percutaneous drainage. The drainage catheter remains well positioned. There is significantly less mass effect on the renal parenchyma. A very small amount of residual subcapsular fluid is noted inferiorly and laterally. If the drain output is significantly reduced (no more than 10-20 mL daily) the percutaneous drain can likely be removed. 2. Persistent fullness of the right renal collecting system with an incompletely imaged double-J ureteral stent in place. The renal collecting system is now low in attenuation rather  than high attenuation is previously seen suggesting resolution of blood products.   Electronically Signed   By: Jacqulynn Cadet M.D.   On: 10/16/2014 10:08   Ct Abdomen Pelvis W Contrast  10/12/2014   CLINICAL DATA:  Subacute onset of fever for 3 weeks. Elevated lactic acid. 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 Initial encounter.  EXAM: CT ABDOMEN AND PELVIS WITH CONTRAST   TECHNIQUE: Multidetector CT imaging of the abdomen and pelvis was performed using the standard protocol following bolus administration of intravenous contrast.  CONTRAST:  120mL OMNIPAQUE IOHEXOL 300 MG/ML  SOLN  COMPARISON:  None.  FINDINGS: The visualized lung bases are clear.  The liver and spleen are unremarkable in appearance. The patient is status post cholecystectomy, with clips noted along the gallbladder fossa. The pancreas and adrenal glands are unremarkable.  A large amount of blood is noted within the right renal calyces and right renal pelvis, and extending along the course of the right ureter. 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 This likely reflects ureteral obstruction, with blood filling the right renal collecting system.  The left kidney is unremarkable in appearance.  The small bowel is unremarkable in appearance. The stomach is within normal limits. No acute vascular abnormalities are seen.  The appendix is not definitely characterized; there is no evidence of appendicitis. Contrast progresses to the level of the rectum. The colon is unremarkable in appearance.  There is diffuse soft tissue stranding and fluid within the pelvis. This may reflect postradiation change, or possibly acute infection or inflammation. This tracks about the adjacent distal ileum.  The uterus is enlarged, and contains central fluid attenuation measuring 8.8 cm in size. This may reflect the underlying malignancy, or postradiation necrosis. An underlying calcified fibroid is seen on the left. Heterogeneous attenuation is noted extending to the cervix, and there is diffuse soft tissue attenuation extending into the right side of the base of the bladder. This raises concern for tumor infiltration, though postradiation change could have a similar appearance. This likely explains right ureteral obstruction, as described above.  A 4.4 cm cystic focus is noted at  the right ovary, likely physiologic in nature.  The bladder is mildly distended. Mild surrounding soft tissue inflammation may reflect postradiation change or possibly cystitis. No inguinal lymphadenopathy is seen.  No acute osseous abnormalities are identified. There is chronic loss of height involving the superior endplate of L3.  IMPRESSION: 1. Large amount of blood noted within the right renal calyces and right renal pelvis, extending along the course of the right ureter. 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 This likely reflects ureteral obstruction, with blood filling the right renal collecting system. 2. Right ureteral obstruction appears to reflect diffuse soft tissue attenuation extending into the right side of the base of bladder. This raises concern for tumor infiltration, though postradiation change could have a similar appearance. 3. Enlarged uterus, containing central fluid attenuation, measuring 8.8 cm. This may reflect the underlying malignancy, or postradiation necrosis. Heterogeneous attenuation extends to the cervix. 4. Diffuse soft tissue inflammation and fluid within the pelvis. This may reflect postradiation change, or possibly acute infection or inflammation. This tracks about the adjacent distal ileum. 5. Mild soft tissue inflammation about the bladder may reflect postradiation change or possibly cystitis.  These results were called by telephone at the time of interpretation on 10/12/2014 at 2:01 am  to Dr. Linton Flemings, who verbally acknowledged these results.   Electronically Signed   By: Garald Balding M.D.   On: 10/12/2014 02:23   US Renal  10/12/2014   CLINICAL DATA:  Acute onset of right-sided flank pain for 2 days. Fever. Status post CT-guided drainage of right-sided perinephric fluid. Subsequent encounter.  EXAM: RENAL / URINARY TRACT ULTRASOUND COMPLETE  COMPARISON:  CT of the abdomen and pelvis performed earlier today at 1:22  a.m.  FINDINGS: Right Kidney:  Length: 13.6 cm. The previously noted large right-sided perinephric fluid collection has resolved. The associated drainage catheter is noted adjacent to the right kidney. There is underlying persistent severe right-sided hydronephrosis. The nature of the fluid within the right renal collecting system is not well assessed on ultrasound.  Left Kidney:  Length: 14.1 cm. Echogenicity within normal limits. No mass or hydronephrosis visualized.  Bladder:  Decompressed, with a Foley catheter in place.  The uterus is enlarged, as noted on prior CT, measuring 12.3 x 8.9 x 9.4 cm, and fluid echogenicity is noted filling the endometrial echo complex, measuring 7.3 x 6.4 cm.  IMPRESSION: 1. Interval resolution of previously noted large right-sided perinephric fluid collection. Associated drainage catheter now seen adjacent to the right kidney. 2. Underlying persistent severe right-sided hydronephrosis. The nature of the fluid within the right renal collecting system is not well assessed on ultrasound. 3. Enlarged uterus again noted, with fluid echogenicity filling the endometrial echo complex. As noted on CT, this could reflect postradiation necrosis, underlying malignancy or possibly obstruction at the level of the cervix.   Electronically Signed   By: Garald Balding M.D.   On: 10/12/2014 19:35   Dg Chest Port 1 View  10/11/2014   CLINICAL DATA:  54 year old female with 5 week history of fever. Medical history includes uterine and cervical cancer.  EXAM: PORTABLE CHEST - 1 VIEW  COMPARISON:  None.  FINDINGS: The lungs are clear and negative for focal airspace consolidation, pulmonary edema or suspicious pulmonary nodule. No pleural effusion or pneumothorax. Cardiac and mediastinal contours are within normal limits. No acute fracture or lytic or blastic osseous lesions. The visualized upper abdominal bowel gas pattern is unremarkable.  IMPRESSION: No active cardiopulmonary disease.    Electronically Signed   By: Jacqulynn Cadet M.D.   On: 10/11/2014 21:20   Ct Image Guided Drainage Percut Cath  Peritoneal Retroperit  10/12/2014   CLINICAL DATA:  Right perinephric fluid collection, fever, bacteremia  EXAM: CT-GUIDED RIGHT PERINEPHRIC FLUID COLLECTION DRAIN INSERTION  Date:  5/17/20165/17/2016 3:10 pm  Radiologist:  M. Daryll Brod, MD  Guidance:  ULTRASOUND AND FLUOROSCOPIC  FLUOROSCOPY TIME:  NONE.  MEDICATIONS AND MEDICAL HISTORY: 1% lidocaine locally, 0.5 mg Versed, 25 mcg fentanyl  ANESTHESIA/SEDATION: 15 minutes  CONTRAST:  None.  COMPLICATIONS: None immediate  PROCEDURE: Informed consent was obtained from the patient following explanation of the procedure, risks, benefits and alternatives. The patient understands, agrees and consents for the procedure. All questions were addressed. A time out was performed.  Maximal barrier sterile technique utilized including caps, mask, sterile gowns, sterile gloves, large sterile drape, hand hygiene, and Betadine.  Previous imaging reviewed. Patient positioned right anterior oblique. Noncontrast CT performed through the abdomen to demonstrate the large right perinephric fluid collection. Under sterile conditions and local anesthesia, an 18 gauge 10 cm access needle was advanced percutaneously into the collection. Needle position confirmed with CT. Guidewire advanced followed by tract dilatation to insert a 10 [place] drain. Catheter position confirmed with  repeat imaging. Syringe aspiration yielded 635 cc dark thin fluid. Sample sent for Gram stain and culture. Catheter secured with a Prolene suture and connected to external suction bulb. Sterile dressing applied. No immediate complication. Patient tolerated the procedure well.  IMPRESSION: Successful CT-guided right perinephric abscess drain insertion.   Electronically Signed   By: Jerilynn Mages.  Shick M.D.   On: 10/12/2014 16:09     CBC  Recent Labs Lab 10/11/14 2042  10/13/14 0020 10/13/14 0643  10/15/14 0721 10/16/14 0420 10/17/14 0438  WBC 8.7  < > 16.2* 13.7* 10.3 14.3* 13.0*  HGB 7.3*  < > 7.8* 8.2* 8.0* 8.6* 7.9*  HCT 23.1*  < > 23.0* 24.7* 24.2* 27.2* 24.1*  PLT 409*  < > 233 261 283 281 292  MCV 94.3  < > 88.1 87.6 88.6 90.4 89.3  MCH 29.8  < > 29.9 29.1 29.3 28.6 29.3  MCHC 31.6  < > 33.9 33.2 33.1 31.6 32.8  RDW 20.3*  < > 20.7* 20.9* 20.8* 20.4* 20.2*  LYMPHSABS 0.1*  --   --   --   --   --   --   MONOABS 0.1  --   --   --   --   --   --   EOSABS 0.0  --   --   --   --   --   --   BASOSABS 0.0  --   --   --   --   --   --   < > = values in this interval not displayed.  Chemistries   Recent Labs Lab 10/11/14 2042 10/12/14 0920 10/13/14 0643 10/14/14 1021 10/15/14 0425 10/16/14 0420 10/17/14 0438  NA 128*  --  134* 133* 133* 134* 132*  K 2.9*  --  2.8* 3.0* 2.8* 3.7 3.5  CL 92*  --  100* 101 101 98* 96*  CO2 25  --  23 22 22 28 29   GLUCOSE 110*  --  94 116* 90 93 102*  BUN 11  --  11 14 12 9 8   CREATININE 1.16* 0.98 1.09* 0.84 0.95 0.91 0.94  CALCIUM 8.1*  --  7.0* 7.6* 7.2* 7.9* 7.5*  MG  --  1.4*  --   --   --  1.8 1.6*  AST 27  --   --   --   --   --  10*  ALT 19  --   --   --   --   --  9*  ALKPHOS 106  --   --   --   --   --  63  BILITOT 1.1  --   --   --   --   --  0.6   ------------------------------------------------------------------------------------------------------------------ estimated creatinine clearance is 69.8 mL/min (by C-G formula based on Cr of 0.94). ------------------------------------------------------------------------------------------------------------------ No results for input(s): HGBA1C in the last 72 hours. ------------------------------------------------------------------------------------------------------------------ No results for input(s): CHOL, HDL, LDLCALC, TRIG, CHOLHDL, LDLDIRECT in the last 72  hours. ------------------------------------------------------------------------------------------------------------------ No results for input(s): TSH, T4TOTAL, T3FREE, THYROIDAB in the last 72 hours.  Invalid input(s): FREET3 ------------------------------------------------------------------------------------------------------------------ No results for input(s): VITAMINB12, FOLATE, FERRITIN, TIBC, IRON, RETICCTPCT in the last 72 hours.  Coagulation profile  Recent Labs Lab 10/12/14 0500  INR 1.42    No results for input(s): DDIMER in the last 72 hours.  Cardiac Enzymes No results for input(s): CKMB, TROPONINI, MYOGLOBIN in the last 168 hours.  Invalid input(s): CK ------------------------------------------------------------------------------------------------------------------ Invalid input(s): POCBNP   Time  Spent in minutes  35   SINGH,PRASHANT K M.D on 10/17/2014 at 12:07 PM  Between 7am to 7pm - Pager - [phone]  After 7pm go to [email or web address] - password [facility]  [facility]   Office  [phone]

## 2014-10-17 NOTE — Progress Notes (Signed)
4 Days Post-Op Subjective: Continue to feel like she is getting better.  No diarrhea in past 24 hours.  Minimal pain.  Temp to 38.3 yesterday at 6pm.  56ml output charted from drain over past 24 hours.  Objective: Vital signs in last 24 hours: Temp:  [98.5 F (36.9 C)-101 F (38.3 C)] 99 F (37.2 C) (05/22 0531) Pulse Rate:  [78-89] 78 (05/22 0531) Resp:  [16-18] 16 (05/22 0531) BP: (112-122)/(57-61) 122/60 mmHg (05/22 0531) SpO2:  [97 %-98 %] 98 % (05/22 0531) Weight:  [171 lb 6.4 oz (77.747 kg)] 171 lb 6.4 oz (77.747 kg) (05/22 0531)  Intake/Output from previous day: 05/21 0701 - 05/22 0700 In: 1125 [P.O.:1120] Out: 20 [Drains:20]  JP - 5 ml  Intake/Output this shift:    Physical Exam:  General: NAD Abd - soft, NT Right retroperitoneal drain- minimal clear serosanguineous fluid  Lab Results:  CBC    Component Value Date/Time   WBC 13.0* 10/17/2014 0438   RBC 2.70* 10/17/2014 0438   HGB 7.9* 10/17/2014 0438   HCT 24.1* 10/17/2014 0438   PLT 292 10/17/2014 0438   MCV 89.3 10/17/2014 0438   MCH 29.3 10/17/2014 0438   MCHC 32.8 10/17/2014 0438   RDW 20.2* 10/17/2014 0438   LYMPHSABS 0.1* 10/11/2014 2042   MONOABS 0.1 10/11/2014 2042   EOSABS 0.0 10/11/2014 2042   BASOSABS 0.0 10/11/2014 2042      Recent Labs  10/15/14 0721 10/16/14 0420 10/17/14 0438  HGB 8.0* 8.6* 7.9*  HCT 24.2* 27.2* 24.1*   BMET  Recent Labs  10/16/14 0420 10/17/14 0438  NA 134* 132*  K 3.7 3.5  CL 98* 96*  CO2 28 29  GLUCOSE 93 102*  BUN 9 8  CREATININE 0.91 0.94  CALCIUM 7.9* 7.5*   No results for input(s): LABPT, INR in the last 72 hours. No results for input(s): LABURIN in the last 72 hours. Results for orders placed or performed during the hospital encounter of 10/11/14  Culture, blood (routine x 2)     Status: None   Collection Time: 10/11/14  8:30 PM  Result Value Ref Range Status   Specimen Description BLOOD RAC  Final   Special Requests BOTTLES DRAWN AEROBIC  AND ANAEROBIC 5CC  Final   Culture   Final    PEPTOSTREPTOCOCCUS SPECIES Note: Gram Stain Report Called to,Read Back By and Verified With: JANELL GARLAND @2040  10/12/14 Performed at [facility]    Report Status 10/14/2014 FINAL  Final  Culture, blood (routine x 2)     Status: None   Collection Time: 10/11/14  8:35 PM  Result Value Ref Range Status   Specimen Description BLOOD RWRIST  Final   Special Requests BOTTLES DRAWN AEROBIC AND ANAEROBIC 5CC  Final   Culture   Final    PEPTOSTREPTOCOCCUS SPECIES Note: Gram Stain Report Called to,Read Back By and Verified With:  JANELL GARLAND @2040  10/12/14 Performed at [facility]    Report Status 10/14/2014 FINAL  Final  Urine culture     Status: None   Collection Time: 10/11/14 10:15 PM  Result Value Ref Range Status   Specimen Description URINE, CLEAN CATCH  Final   Special Requests NONE  Final   Colony Count   Final    30,000 COLONIES/ML Performed at [facility]    Culture   Final    Multiple bacterial morphotypes present, none predominant. Suggest appropriate recollection if clinically indicated. Performed at [facility]  Report Status 10/12/2014 FINAL  Final  MRSA PCR Screening     Status: None   Collection Time: 10/12/14 11:12 AM  Result Value Ref Range Status   MRSA by PCR NEGATIVE NEGATIVE Final    Comment:        The GeneXpert MRSA Assay (FDA approved for NASAL specimens only), is one component of a comprehensive MRSA colonization surveillance program. It is not intended to diagnose MRSA infection nor to guide or monitor treatment for MRSA infections.   Culture, routine-abscess     Status: None   Collection Time: 10/12/14  3:44 PM  Result Value Ref Range Status   Specimen Description ABSCESS  Final   Special Requests Normal  Final   Gram Stain   Final    RARE WBC PRESENT, PREDOMINANTLY PMN NO SQUAMOUS EPITHELIAL CELLS SEEN NO ORGANISMS SEEN Performed at [facility]    Culture   Final    NO GROWTH 3 DAYS Performed at [facility]    Report Status 10/16/2014 FINAL  Final  Anaerobic culture     Status: None (Preliminary result)   Collection Time: 10/12/14  3:44 PM  Result Value Ref Range Status   Specimen Description ABSCESS  Final   Special Requests Normal  Final   Gram Stain   Final    RARE WBC PRESENT, PREDOMINANTLY PMN NO SQUAMOUS EPITHELIAL CELLS SEEN NO ORGANISMS SEEN Performed at [facility]    Culture   Final    NO ANAEROBES ISOLATED; CULTURE IN PROGRESS FOR 5 DAYS Performed at [facility]    Report Status PENDING  Incomplete  Clostridium Difficile by PCR     Status: Abnormal   Collection Time: 10/15/14 12:36 PM  Result Value Ref Range Status   C difficile by pcr POSITIVE (A) NEGATIVE Final    Comment: CRITICAL RESULT CALLED TO, READ BACK BY AND VERIFIED WITH: Marylou Mccoy [id number] @ 1245 BY J SCOTTON     Studies/Results: Ct Abdomen Wo Contrast  10/16/2014   CLINICAL DATA:  54 year old female follow-up right perinephric fluid drainage  EXAM: CT ABDOMEN WITHOUT CONTRAST  TECHNIQUE: Multidetector CT imaging of the abdomen was performed following the standard protocol without IV contrast.  COMPARISON:  Prior CT scan 10/12/2014  FINDINGS: Lower Chest: The lung bases are clear. Visualized cardiac structures are within normal limits for size. No pericardial effusion. Unremarkable visualized distal thoracic esophagus.  Abdomen: Unenhanced CT was performed per clinician order. Lack of IV contrast limits sensitivity and specificity, especially for evaluation of abdominal/pelvic solid viscera. Within these limitations, unremarkable CT appearance of the stomach, duodenum, spleen, adrenal glands and pancreas. Normal hepatic contour morphology. No discrete hepatic lesion. Gallbladder is surgically absent. No intra or extrahepatic biliary ductal dilatation.  Near-total interval resolution of the large right  subcapsular perinephric fluid collection. The percutaneous drainage catheter is well positioned just posterior to the kidney. There is trace residual subcapsular fluid along the lateral inferior margin of the kidney measuring only up to 8 mm in width. There is significantly reduced mass effect on the kidney. No evidence of nephrolithiasis. Continued mild prominence of the right renal collecting system with an incompletely imaged right double-J ureteral stent in place. The renal collecting system is now water attenuation compared to high attenuation as seen previously. Unremarkable left kidney.  Very mild perinephric stranding bilaterally which is nonspecific. No evidence of bowel obstruction or focal bowel wall thickening in the visualized portions of the bowel.  Bones/Soft Tissues: No acute  fracture or aggressive appearing lytic or blastic osseous lesion.  Vascular: Limited evaluation in the absence of IV contrast. No aneurysm.  IMPRESSION: 1. Near-total interval resolution of the large right subcapsular perinephric fluid collection status post percutaneous drainage. The drainage catheter remains well positioned. There is significantly less mass effect on the renal parenchyma. A very small amount of residual subcapsular fluid is noted inferiorly and laterally. If the drain output is significantly reduced (no more than 10-20 mL daily) the percutaneous drain can likely be removed. 2. Persistent fullness of the right renal collecting system with an incompletely imaged double-J ureteral stent in place. The renal collecting system is now low in attenuation rather than high attenuation is previously seen suggesting resolution of blood products.   Electronically Signed   By: Jacqulynn Cadet M.D.   On: 10/16/2014 10:08    Assessment/Plan: Right hydronephrosis, right distal ureteral stricture-status post cystoscopy with ureteral stricture dilation and right ureteral stent placement.  Foley DC'd yesterday.  Right  retroperitoneal/perinephric fluid collection with persistent low output.  Testing consistent with serum and not urine.   It appears that the drain is in good position and has drained the fluid collection.  Around 20cc from drain charted over past 24 hours. Given that culture from drain negative and patient improving, we think it is reasonable to remove the drain - however we will defer this to radiology if they think that current output justifies continuing the drain until output decreases more.   Sepsis - ? Source.  Blood cultures growing Peptostreptococcus. New blood cultures sent.  Antibiotics per primary team.  Will continue to follow.    LOS: 5 days   Baltazar Najjar, Will 10/17/2014, 8:25 AM

## 2014-10-18 LAB — BASIC METABOLIC PANEL
ANION GAP: 7 (ref 5–15)
BUN: 6 mg/dL (ref 6–20)
CHLORIDE: 97 mmol/L — AB (ref 101–111)
CO2: 28 mmol/L (ref 22–32)
Calcium: 7.8 mg/dL — ABNORMAL LOW (ref 8.9–10.3)
Creatinine, Ser: 0.94 mg/dL (ref 0.44–1.00)
GFR calc non Af Amer: >60 mL/min (ref >60)
Glucose, Bld: 102 mg/dL — ABNORMAL HIGH (ref 65–99)
Potassium: 4.4 mmol/L (ref 3.5–5.1)
Sodium: 132 mmol/L — ABNORMAL LOW (ref 135–145)

## 2014-10-18 LAB — CBC
HEMATOCRIT: 25.8 % — AB (ref 36.0–46.0)
Hemoglobin: 8.4 g/dL — ABNORMAL LOW (ref 12.0–15.0)
MCH: 29.4 pg (ref 26.0–34.0)
MCHC: 32.6 g/dL (ref 30.0–36.0)
MCV: 90.2 fL (ref 78.0–100.0)
Platelets: 310 10*3/uL (ref 150–400)
RBC: 2.86 MIL/uL — ABNORMAL LOW (ref 3.87–5.11)
RDW: 20.3 % — AB (ref 11.5–15.5)
WBC: 12.1 10*3/uL — ABNORMAL HIGH (ref 4.0–10.5)

## 2014-10-18 LAB — URIC ACID, RANDOM URINE: Uric Acid, Urine: 34.4 mg/dL

## 2014-10-18 LAB — OSMOLALITY, URINE: Osmolality, Ur: 309 mOsm/kg — ABNORMAL LOW (ref 390–1090)

## 2014-10-18 LAB — SODIUM, URINE, RANDOM: SODIUM UR: 101 meq/L

## 2014-10-18 LAB — MAGNESIUM: Magnesium: 1.9 mg/dL (ref 1.7–2.4)

## 2014-10-18 MED ORDER — METRONIDAZOLE 500 MG PO TABS: 500.0000 mg | ORAL_TABLET | Freq: Three times a day (TID) | ORAL | Status: DC

## 2014-10-18 MED ORDER — CEFUROXIME AXETIL 500 MG PO TABS: 500.0000 mg | ORAL_TABLET | Freq: Two times a day (BID) | ORAL | Status: DC

## 2014-10-18 MED ORDER — CEFUROXIME AXETIL 500 MG PO TABS
500.0000 mg | ORAL_TABLET | Freq: Two times a day (BID) | ORAL | Status: DC
  Administered 2014-10-18: 500 mg via ORAL
  Filled 2014-10-18 (×3): qty 1

## 2014-10-18 MED ORDER — BOOST PLUS PO LIQD: 237.0000 mL | Freq: Three times a day (TID) | ORAL | Status: DC

## 2014-10-18 MED ORDER — ONDANSETRON 8 MG PO TBDP: 8.0000 mg | ORAL_TABLET | Freq: Three times a day (TID) | ORAL | Status: DC | PRN

## 2014-10-18 NOTE — Discharge Summary (Signed)
Stacy Webster, is a 54 y.o. female  DOB 1961-01-03  MRN [id number].  Admission date:  10/11/2014  Admitting Physician  Stacy Gobble, MD  Discharge Date:  10/18/2014   Primary MD  Stacy Neer, MD  Recommendations for primary care physician for things to follow:   Check CBC, CMP in 1 week, follow final culture results please next visit.    Admission Diagnosis  Fever [R50.9] Sepsis, due to unspecified organism [A41.9]   Discharge Diagnosis  Fever [R50.9] Sepsis, due to unspecified organism [A41.9]     Principal Problem:   Sepsis Active Problems:   Perinephric fluid collection   Bacteremia due to Gram-positive bacteria   Ureteral stricture, right   Hydronephrosis, right   Cervical cancer   Malnutrition of moderate degree      Past Medical History  Diagnosis Date  . Cancer     uterine and cervical    Past Surgical History  Procedure Laterality Date  . Cholecystectomy    . Tonsillectomy    . Cystoscopy w/ ureteral stent placement Right 10/13/2014    Procedure: CYSTOSCOPY WITH RETROGRADE PYELOGRAM/URETERAL STENT PLACEMENT;  Surgeon: Stacy Aloe, MD;  Location: [facility];  Service: Urology;  Laterality: Right;       History of present illness and  Hospital Course:     Kindly see H&P for history of present illness and admission details, please review complete Labs, Consult reports and Test reports for all details in brief  HPI  from the history and physical done on the day of admission  54 yo, married, school teacher, never smoker, dx with cervical cancer 12/15, completed rtx/chemo 4/16([facility] ,Charlean Merl). Plan to have surgery once tumor has shrunk. She presents to [facility] ED 5/16 2000 hrs with chief complaint of fever up to 102 over last 2 weeks. [facility] instructed her to use  antipyretics. She further developed intense rt flank pain without nausea or vomiting but positive for chills and sweats. In [facility] ED CT abd revealed blood right renal calyces, urinoma, Rt ureteral obstruction, possible tumor excursion, along with rtx changes.Treated in ED with IV fluids but had persistent hypotension that required pressor support. She is awake and alert, sbp 114 on 5 mcg of levo, making urine, therefore continue IVF, wean pressors, broad spectrum abx, admit to ICU. She is to have perq drain placed by IR, urology has seen her and placed foley.  Hospital Course    1. Sepsis with Peptostreptococcus bacteremia secondary to combination of right-sided hydronephrosis, right-sided perinephric fluid collection along with postradiation pelvic inflammation around the small bowel, this was not visualized on repeat CT scan on 10/16/2014. Sepsis is resolved after appropriate supportive care in ICU under the care of pulmonary and critical care. She subsequently transferred to the floor after clinical improvement, she is improving  Leukocytosis, her case was discussed with urologist Dr. Junious Webster and ID physician Dr. Drucilla Webster by me on 10/15/2014. And the plan was to place her on oral Ceftin to complete total of 2 weeks of antibiotic treatment, Flagyl  will be added for anaerobic coverage along with coverage for C. difficile. She will follow with urology, PCP and ID outpatient.   She had a right-sided perinephric drain placement by IR which was removed by urology on 10/18/2014 prior to discharge. I discussed her case with urologist Dr. Junious Webster again today prior to discharge. He agrees with the plan. Repeat blood cultures drawn 2 days ago have been negative thus far. Request PCP to please monitor final blood culture results.   2. Cervical cancer. Was getting treatment at no month, he want to see a new oncologist. Appointment set with Dr. Denman Webster for early next month. [place].   3. C.  difficile colitis. Placed on Flagyl on 10/15/2014. Diarrhea much improved now down to 1 to 2 bowel movements a day.   4. Hypokalemia and hypomagnesemia - replaced, monitor.   5. Mild-to-moderate protein calorie malnutrition and poor appetite. Placed on nutritional supplements. Gentle hydration for 24 hours on 10/17/2014.   6. Mild hyponatremia. Likely due to dehydration, hydrated request PCP to repeat BMP in a week.     Discharge Condition: Stable   Follow UP  Follow-up Information    Follow up with [facility], MATTHEW, MD.   Specialty:  Urology   Why:  3-4 weeks    Contact information:   [place] [address] [phone]       Follow up with SHAW,KIMBERLEE, MD. Schedule an appointment as soon as possible for a visit in 1 week.   Specialty:  Family Medicine   Contact information:   [address] [place] [address] [phone]       Follow up with Stacy Eva, MD On 11/01/2014.   Specialty:  Obstetrics and Gynecology   Why:  10am   Contact information:   [place] [address] [phone]       Follow up with Stacy Evener, MD. Schedule an appointment as soon as possible for a visit in 1 week.   Specialty:  Infectious Diseases   Why:  bacteremia    Contact information:   [address] [place] [address] [phone]       Follow up with ESKRIDGE, MATTHEW, MD. Schedule an appointment as soon as possible for a visit in 1 week.   Specialty:  Urology   Why:  Hydronephrosis   Contact information:   [place] [address] [phone]         Discharge Instructions  and  Discharge Medications      Discharge Instructions    Diet - low sodium heart healthy    Complete by:  As directed      Discharge instructions    Complete by:  As directed   Follow with Primary MD Stacy Neer, MD in 3-5 days , follow final culture results  Get CBC, CMP, 2 view Chest  X ray checked  by Primary MD next visit.    Activity: As tolerated with Full fall precautions use walker/cane & assistance as needed   Disposition Home    Diet: Heart Healthy    For Heart failure patients - Check your Weight same time everyday, if you gain over 2 pounds, or you develop in leg swelling, experience more shortness of breath or chest pain, call your Primary MD immediately. Follow Cardiac Low Salt Diet and 1.5 lit/day fluid restriction.   On your next visit with your  primary care physician please Get Medicines reviewed and adjusted.   Please request your Prim.MD to go over all Hospital Tests and Procedure/Radiological results at the follow up, please get all Hospital records sent to your Prim MD by signing hospital release before you go home.   If you experience worsening of your admission symptoms, develop shortness of breath, life threatening emergency, suicidal or homicidal thoughts you must seek medical attention immediately by calling 911 or calling your MD immediately  if symptoms less severe.  You Must read complete instructions/literature along with all the possible adverse reactions/side effects for all the Medicines you take and that have been prescribed to you. Take any new Medicines after you have completely understood and accpet all the possible adverse reactions/side effects.   Do not drive, operating heavy machinery, perform activities at heights, swimming or participation in water activities or provide baby sitting services if your were admitted for syncope or siezures until you have seen by Primary MD or a Neurologist and advised to do so again.  Do not drive when taking Pain medications.    Do not take more than prescribed Pain, Sleep and Anxiety Medications  Special Instructions: If you have smoked or chewed Tobacco  in the last 2 yrs please stop smoking, stop any regular Alcohol  and or any Recreational drug use.  Wear Seat belts while  driving.   Please note  You were cared for by a hospitalist during your hospital stay. If you have any questions about your discharge medications or the care you received while you were in the hospital after you are discharged, you can call the unit and asked to speak with the hospitalist on call if the hospitalist that took care of you is not available. Once you are discharged, your primary care physician will handle any further medical issues. Please note that NO REFILLS for any discharge medications will be authorized once you are discharged, as it is imperative that you return to your primary care physician (or establish a relationship with a primary care physician if you do not have one) for your aftercare needs so that they can reassess your need for medications and monitor your lab values.       Ureteral Stent Implantation, Care After Refer to this sheet in the next few weeks. These instructions provide you with information on caring for yourself after your procedure. Your health care provider may also give you more specific instructions. Your treatment has been planned according to current medical practices, but problems sometimes occur. Call your health care provider if you have any problems or questions after your procedure. WHAT TO EXPECT AFTER THE PROCEDURE You should be back to normal activity within 48 hours after the procedure. Nausea and vomiting may occur and are commonly the result of anesthesia. It is common to experience sharp pain in the back or lower abdomen and penis with voiding. This is caused by movement of the ends of the stent with the act of urinating.It usually goes away within minutes after you have stopped urinating. HOME CARE INSTRUCTIONS Make sure to drink plenty of fluids. You may have small amounts of bleeding, causing your urine to be red. This is normal. Certain movements may trigger pain or a feeling that you need to urinate. You may be given medicines to  prevent infection or bladder spasms. Be sure to take all medicines as directed. Only take over-the-counter or prescription medicines for pain, discomfort, or fever as directed by your health care provider.  Do not take aspirin, as this can make bleeding worse. Your stent will be left in until the blockage is resolved. This may take 2 weeks or longer, depending on the reason for stent implantation. You may have an X-ray exam to make sure your ureter is open and that the stent has not moved out of position (migrated). The stent can be removed by your health care provider in the office. Medicines may be given for comfort while the stent is being removed. Be sure to keep all follow-up appointments so your health care provider can check that you are healing properly. SEEK MEDICAL CARE IF: You experience increasing pain. Your pain medicine is not working. SEEK IMMEDIATE MEDICAL CARE IF: Your urine is dark red or has blood clots. You are leaking urine (incontinent). You have a fever, chills, feeling sick to your stomach (nausea), or vomiting. Your pain is not relieved by pain medicine. The end of the stent comes out of the urethra. You are unable to urinate.     Increase activity slowly    Complete by:  As directed             Medication List    STOP taking these medications        ibuprofen 200 MG tablet  Commonly known as:  ADVIL,MOTRIN      TAKE these medications        acetaminophen 500 MG tablet  Commonly known as:  TYLENOL  Take 500 mg by mouth every 6 (six) hours as needed.     ADVAIR DISKUS 250-50 MCG/DOSE Aepb  Generic drug:  Fluticasone-Salmeterol  Inhale 1 puff into the lungs daily.     cefUROXime 500 MG tablet  Commonly known as:  CEFTIN  Take 1 tablet (500 mg total) by mouth 2 (two) times daily with a meal.     lactose free nutrition Liqd  Take 237 mLs by mouth 3 (three) times daily with meals.     LORazepam 1 MG tablet  Commonly known as:  ATIVAN  Take 1 mg by  mouth every 6 (six) hours as needed (nausea).     metroNIDAZOLE 500 MG tablet  Commonly known as:  FLAGYL  Take 1 tablet (500 mg total) by mouth every 8 (eight) hours.     ondansetron 8 MG disintegrating tablet  Commonly known as:  ZOFRAN-ODT  Take 1 tablet (8 mg total) by mouth every 8 (eight) hours as needed for nausea or vomiting.          Diet and Activity recommendation: See Discharge Instructions above   Consults obtained -  Urology, PCCM, ID Dr Edsel Petrin over the phone   Major procedures and Radiology Reports - PLEASE review detailed and final reports for all details, in brief -   5/17 ct abd as noted 5/17 renal US after fluid drainage >> continued hydronephrosis  TTE 5/18 >> normal LV and RV fxn, normal valves - Left ventricle: The cavity size was normal. Wall thickness wasincreased in a pattern of mild LVH. Systolic function was normal. The estimated ejection fraction was in the range of 55% to 60%. Wall motion was normal; there were no regional wall motion abnormalities. Left ventricular diastolic function parameters were normal.  Perc drainage of R perinephric fluid collection 5/17 Cystoscopy and R ureteral stent placement 5/18 CT scan abdomen and pelvis 10/16/2014.   Ct Abdomen Wo Contrast  10/16/2014   CLINICAL DATA:  54 year old female follow-up right perinephric fluid drainage  EXAM: CT ABDOMEN WITHOUT  CONTRAST  TECHNIQUE: Multidetector CT imaging of the abdomen was performed following the standard protocol without IV contrast.  COMPARISON:  Prior CT scan 10/12/2014  FINDINGS: Lower Chest: The lung bases are clear. Visualized cardiac structures are within normal limits for size. No pericardial effusion. Unremarkable visualized distal thoracic esophagus.  Abdomen: Unenhanced CT was performed per clinician order. Lack of IV contrast limits sensitivity and specificity, especially for evaluation of abdominal/pelvic solid viscera. Within these limitations, unremarkable CT  appearance of the stomach, duodenum, spleen, adrenal glands and pancreas. Normal hepatic contour morphology. No discrete hepatic lesion. Gallbladder is surgically absent. No intra or extrahepatic biliary ductal dilatation.  Near-total interval resolution of the large right subcapsular perinephric fluid collection. The percutaneous drainage catheter is well positioned just posterior to the kidney. There is trace residual subcapsular fluid along the lateral inferior margin of the kidney measuring only up to 8 mm in width. There is significantly reduced mass effect on the kidney. No evidence of nephrolithiasis. Continued mild prominence of the right renal collecting system with an incompletely imaged right double-J ureteral stent in place. The renal collecting system is now water attenuation compared to high attenuation as seen previously. Unremarkable left kidney.  Very mild perinephric stranding bilaterally which is nonspecific. No evidence of bowel obstruction or focal bowel wall thickening in the visualized portions of the bowel.  Bones/Soft Tissues: No acute fracture or aggressive appearing lytic or blastic osseous lesion.  Vascular: Limited evaluation in the absence of IV contrast. No aneurysm.  IMPRESSION: 1. Near-total interval resolution of the large right subcapsular perinephric fluid collection status post percutaneous drainage. The drainage catheter remains well positioned. There is significantly less mass effect on the renal parenchyma. A very small amount of residual subcapsular fluid is noted inferiorly and laterally. If the drain output is significantly reduced (no more than 10-20 mL daily) the percutaneous drain can likely be removed. 2. Persistent fullness of the right renal collecting system with an incompletely imaged double-J ureteral stent in place. The renal collecting system is now low in attenuation rather than high attenuation is previously seen suggesting resolution of blood products.    Electronically Signed   By: Jacqulynn Cadet M.D.   On: 10/16/2014 10:08   Ct Abdomen Pelvis W Contrast  10/12/2014   CLINICAL DATA:  Subacute onset of fever for 3 weeks. Elevated lactic acid. 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 Initial encounter.  EXAM: CT ABDOMEN AND PELVIS WITH CONTRAST  TECHNIQUE: Multidetector CT imaging of the abdomen and pelvis was performed using the standard protocol following bolus administration of intravenous contrast.  CONTRAST:  161mL OMNIPAQUE IOHEXOL 300 MG/ML  SOLN  COMPARISON:  None.  FINDINGS: The visualized lung bases are clear.  The liver and spleen are unremarkable in appearance. The patient is status post cholecystectomy, with clips noted along the gallbladder fossa. The pancreas and adrenal glands are unremarkable.  A large amount of blood is noted within the right renal calyces and right renal pelvis, and extending along the course of the right ureter. 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 This likely reflects ureteral obstruction, with blood filling the right renal collecting system.  The left kidney is unremarkable in appearance.  The small bowel is unremarkable in appearance. The stomach is within normal limits. No acute vascular abnormalities are seen.  The appendix is not definitely characterized; there is no evidence of appendicitis. Contrast progresses to the  level of the rectum. The colon is unremarkable in appearance.  There is diffuse soft tissue stranding and fluid within the pelvis. This may reflect postradiation change, or possibly acute infection or inflammation. This tracks about the adjacent distal ileum.  The uterus is enlarged, and contains central fluid attenuation measuring 8.8 cm in size. This may reflect the underlying malignancy, or postradiation necrosis. An underlying calcified fibroid is seen on the left. Heterogeneous attenuation is  noted extending to the cervix, and there is diffuse soft tissue attenuation extending into the right side of the base of the bladder. This raises concern for tumor infiltration, though postradiation change could have a similar appearance. This likely explains right ureteral obstruction, as described above.  A 4.4 cm cystic focus is noted at the right ovary, likely physiologic in nature.  The bladder is mildly distended. Mild surrounding soft tissue inflammation may reflect postradiation change or possibly cystitis. No inguinal lymphadenopathy is seen.  No acute osseous abnormalities are identified. There is chronic loss of height involving the superior endplate of L3.  IMPRESSION: 1. Large amount of blood noted within the right renal calyces and right renal pelvis, extending along the course of the right ureter. 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 This likely reflects ureteral obstruction, with blood filling the right renal collecting system. 2. Right ureteral obstruction appears to reflect diffuse soft tissue attenuation extending into the right side of the base of bladder. This raises concern for tumor infiltration, though postradiation change could have a similar appearance. 3. Enlarged uterus, containing central fluid attenuation, measuring 8.8 cm. This may reflect the underlying malignancy, or postradiation necrosis. Heterogeneous attenuation extends to the cervix. 4. Diffuse soft tissue inflammation and fluid within the pelvis. This may reflect postradiation change, or possibly acute infection or inflammation. This tracks about the adjacent distal ileum. 5. Mild soft tissue inflammation about the bladder may reflect postradiation change or possibly cystitis.  These results were called by telephone at the time of interpretation on 10/12/2014 at 2:01 am to Dr. Linton Flemings, who verbally acknowledged these results.   Electronically Signed   By: Garald Balding  M.D.   On: 10/12/2014 02:23   US Renal  10/12/2014   CLINICAL DATA:  Acute onset of right-sided flank pain for 2 days. Fever. Status post CT-guided drainage of right-sided perinephric fluid. Subsequent encounter.  EXAM: RENAL / URINARY TRACT ULTRASOUND COMPLETE  COMPARISON:  CT of the abdomen and pelvis performed earlier today at 1:22 a.m.  FINDINGS: Right Kidney:  Length: 13.6 cm. The previously noted large right-sided perinephric fluid collection has resolved. The associated drainage catheter is noted adjacent to the right kidney. There is underlying persistent severe right-sided hydronephrosis. The nature of the fluid within the right renal collecting system is not well assessed on ultrasound.  Left Kidney:  Length: 14.1 cm. Echogenicity within normal limits. No mass or hydronephrosis visualized.  Bladder:  Decompressed, with a Foley catheter in place.  The uterus is enlarged, as noted on prior CT, measuring 12.3 x 8.9 x 9.4 cm, and fluid echogenicity is noted filling the endometrial echo complex, measuring 7.3 x 6.4 cm.  IMPRESSION: 1. Interval resolution of previously noted large right-sided perinephric fluid collection. Associated drainage catheter now seen adjacent to the right kidney. 2. Underlying persistent severe right-sided hydronephrosis. The nature of the fluid within the right renal collecting system is not well assessed on ultrasound. 3. Enlarged uterus again noted, with fluid echogenicity filling the  endometrial echo complex. As noted on CT, this could reflect postradiation necrosis, underlying malignancy or possibly obstruction at the level of the cervix.   Electronically Signed   By: Garald Balding M.D.   On: 10/12/2014 19:35   Dg Chest Port 1 View  10/11/2014   CLINICAL DATA:  54 year old female with 5 week history of fever. Medical history includes uterine and cervical cancer.  EXAM: PORTABLE CHEST - 1 VIEW  COMPARISON:  None.  FINDINGS: The lungs are clear and negative for focal airspace  consolidation, pulmonary edema or suspicious pulmonary nodule. No pleural effusion or pneumothorax. Cardiac and mediastinal contours are within normal limits. No acute fracture or lytic or blastic osseous lesions. The visualized upper abdominal bowel gas pattern is unremarkable.  IMPRESSION: No active cardiopulmonary disease.   Electronically Signed   By: Jacqulynn Cadet M.D.   On: 10/11/2014 21:20   Ct Image Guided Drainage Percut Cath  Peritoneal Retroperit  10/12/2014   CLINICAL DATA:  Right perinephric fluid collection, fever, bacteremia  EXAM: CT-GUIDED RIGHT PERINEPHRIC FLUID COLLECTION DRAIN INSERTION  Date:  5/17/20165/17/2016 3:10 pm  Radiologist:  M. Daryll Brod, MD  Guidance:  ULTRASOUND AND FLUOROSCOPIC  FLUOROSCOPY TIME:  NONE.  MEDICATIONS AND MEDICAL HISTORY: 1% lidocaine locally, 0.5 mg Versed, 25 mcg fentanyl  ANESTHESIA/SEDATION: 15 minutes  CONTRAST:  None.  COMPLICATIONS: None immediate  PROCEDURE: Informed consent was obtained from the patient following explanation of the procedure, risks, benefits and alternatives. The patient understands, agrees and consents for the procedure. All questions were addressed. A time out was performed.  Maximal barrier sterile technique utilized including caps, mask, sterile gowns, sterile gloves, large sterile drape, hand hygiene, and Betadine.  Previous imaging reviewed. Patient positioned right anterior oblique. Noncontrast CT performed through the abdomen to demonstrate the large right perinephric fluid collection. Under sterile conditions and local anesthesia, an 18 gauge 10 cm access needle was advanced percutaneously into the collection. Needle position confirmed with CT. Guidewire advanced followed by tract dilatation to insert a 10 [place] drain. Catheter position confirmed with repeat imaging. Syringe aspiration yielded 635 cc dark thin fluid. Sample sent for Gram stain and culture. Catheter secured with a Prolene suture and connected to external  suction bulb. Sterile dressing applied. No immediate complication. Patient tolerated the procedure well.  IMPRESSION: Successful CT-guided right perinephric abscess drain insertion.   Electronically Signed   By: Jerilynn Mages.  Shick M.D.   On: 10/12/2014 16:09    Micro Results      Recent Results (from the past 240 hour(s))  Culture, blood (routine x 2)     Status: None   Collection Time: 10/11/14  8:30 PM  Result Value Ref Range Status   Specimen Description BLOOD RAC  Final   Special Requests BOTTLES DRAWN AEROBIC AND ANAEROBIC 5CC  Final   Culture   Final    PEPTOSTREPTOCOCCUS SPECIES Note: Gram Stain Report Called to,Read Back By and Verified With: JANELL GARLAND @2040  10/12/14 Performed at [facility]    Report Status 10/14/2014 FINAL  Final  Culture, blood (routine x 2)     Status: None   Collection Time: 10/11/14  8:35 PM  Result Value Ref Range Status   Specimen Description BLOOD RWRIST  Final   Special Requests BOTTLES DRAWN AEROBIC AND ANAEROBIC 5CC  Final   Culture   Final    PEPTOSTREPTOCOCCUS SPECIES Note: Gram Stain Report Called to,Read Back By and Verified With:  Barth Kirks @2040  10/12/14 Performed at [facility]  Report Status 10/14/2014 FINAL  Final  Urine culture     Status: None   Collection Time: 10/11/14 10:15 PM  Result Value Ref Range Status   Specimen Description URINE, CLEAN CATCH  Final   Special Requests NONE  Final   Colony Count   Final    30,000 COLONIES/ML Performed at [facility]    Culture   Final    Multiple bacterial morphotypes present, none predominant. Suggest appropriate recollection if clinically indicated. Performed at [facility]    Report Status 10/12/2014 FINAL  Final  MRSA PCR Screening     Status: None   Collection Time: 10/12/14 11:12 AM  Result Value Ref Range Status   MRSA by PCR NEGATIVE NEGATIVE Final    Comment:        The GeneXpert MRSA Assay (FDA approved for NASAL specimens only),  is one component of a comprehensive MRSA colonization surveillance program. It is not intended to diagnose MRSA infection nor to guide or monitor treatment for MRSA infections.   Culture, routine-abscess     Status: None   Collection Time: 10/12/14  3:44 PM  Result Value Ref Range Status   Specimen Description ABSCESS  Final   Special Requests Normal  Final   Gram Stain   Final    RARE WBC PRESENT, PREDOMINANTLY PMN NO SQUAMOUS EPITHELIAL CELLS SEEN NO ORGANISMS SEEN Performed at [facility]    Culture   Final    NO GROWTH 3 DAYS Performed at [facility]    Report Status 10/16/2014 FINAL  Final  Anaerobic culture     Status: None   Collection Time: 10/12/14  3:44 PM  Result Value Ref Range Status   Specimen Description ABSCESS  Final   Special Requests Normal  Final   Gram Stain   Final    RARE WBC PRESENT, PREDOMINANTLY PMN NO SQUAMOUS EPITHELIAL CELLS SEEN NO ORGANISMS SEEN Performed at [facility]    Culture   Final    NO ANAEROBES ISOLATED Performed at [facility]    Report Status 10/17/2014 FINAL  Final  Clostridium Difficile by PCR     Status: Abnormal   Collection Time: 10/15/14 12:36 PM  Result Value Ref Range Status   C difficile by pcr POSITIVE (A) NEGATIVE Final    Comment: CRITICAL RESULT CALLED TO, READ BACK BY AND VERIFIED WITH: Marylou Mccoy [id number] @ 1426 BY J SCOTTON   Culture, blood (routine x 2)     Status: None (Preliminary result)   Collection Time: 10/16/14  7:55 AM  Result Value Ref Range Status   Specimen Description BLOOD LEFT ARM  Final   Special Requests   Final    BOTTLES DRAWN AEROBIC AND ANAEROBIC 10CC BOTH BOTTLES   Culture   Final           BLOOD CULTURE RECEIVED NO GROWTH TO DATE CULTURE WILL BE HELD FOR 5 DAYS BEFORE ISSUING A FINAL NEGATIVE REPORT Performed at [facility]    Report Status PENDING  Incomplete  Culture, blood (routine x 2)     Status: None (Preliminary result)    Collection Time: 10/16/14  8:01 AM  Result Value Ref Range Status   Specimen Description BLOOD LEFT ARM  Final   Special Requests   Final    BOTTLES DRAWN AEROBIC AND ANAEROBIC 10CC BOTH BOTTLES   Culture   Final           BLOOD CULTURE RECEIVED NO  GROWTH TO DATE CULTURE WILL BE HELD FOR 5 DAYS BEFORE ISSUING A FINAL NEGATIVE REPORT Performed at [facility]    Report Status PENDING  Incomplete       Today   Subjective:   Aretha Levi today has no headache,no chest abdominal pain,no new weakness tingling or numbness, feels much better wants to go home today.    Objective:   Blood pressure 124/55, pulse 83, temperature 99 F (37.2 C), temperature source Oral, resp. rate 16, height 5\' 4"  (1.626 m), weight 77.747 kg (171 lb 6.4 oz), SpO2 98 %.   Intake/Output Summary (Last 24 hours) at 10/18/14 0832 Last data filed at 10/18/14 0527  Gross per 24 hour  Intake    610 ml  Output     20 ml  Net    590 ml    Exam Awake Alert, Oriented x 3, No new F.N deficits, Normal affect [place].AT,PERRAL Supple Neck,No JVD, No cervical lymphadenopathy appriciated.  Symmetrical Chest wall movement, Good air movement bilaterally, CTAB RRR,No Gallops,Rubs or new Murmurs, No Parasternal Heave +ve B.Sounds, Abd Soft, Non tender, No organomegaly appriciated, No rebound -guarding or rigidity. No Cyanosis, Clubbing or edema, No new Rash or bruise  Data Review   CBC w Diff: Lab Results  Component Value Date   WBC 12.1* 10/18/2014   HGB 8.4* 10/18/2014   HCT 25.8* 10/18/2014   PLT 310 10/18/2014   LYMPHOPCT 1* 10/11/2014   MONOPCT 1* 10/11/2014   EOSPCT 0 10/11/2014   BASOPCT 0 10/11/2014    CMP: Lab Results  Component Value Date   NA 132* 10/18/2014   K 4.4 10/18/2014   CL 97* 10/18/2014   CO2 28 10/18/2014   BUN 6 10/18/2014   CREATININE 0.94 10/18/2014   PROT 5.7* 10/17/2014   ALBUMIN 1.7* 10/17/2014   BILITOT 0.6 10/17/2014   ALKPHOS 63 10/17/2014   AST 10* 10/17/2014     ALT 9* 10/17/2014  .   Total Time in preparing paper work, data evaluation and todays exam - 35 minutes  Thurnell Lose M.D on 10/18/2014 at 8:32 AM  [facility]   Office  [phone]

## 2014-10-18 NOTE — Discharge Instructions (Signed)
Follow with Primary MD [name], MD in 3-5 days , follow final culture results  Get CBC, CMP, 2 view Chest X ray checked  by Primary MD next visit.    Activity: As tolerated with Full fall precautions use walker/cane & assistance as needed   Disposition Home    Diet: Heart Healthy    For Heart failure patients - Check your Weight same time everyday, if you gain over 2 pounds, or you develop in leg swelling, experience more shortness of breath or chest pain, call your Primary MD immediately. Follow Cardiac Low Salt Diet and 1.5 lit/day fluid restriction.   On your next visit with your primary care physician please Get Medicines reviewed and adjusted.   Please request your Prim.MD to go over all Hospital Tests and Procedure/Radiological results at the follow up, please get all Hospital records sent to your Prim MD by signing hospital release before you go home.   If you experience worsening of your admission symptoms, develop shortness of breath, life threatening emergency, suicidal or homicidal thoughts you must seek medical attention immediately by calling 911 or calling your MD immediately  if symptoms less severe.  You Must read complete instructions/literature along with all the possible adverse reactions/side effects for all the Medicines you take and that have been prescribed to you. Take any new Medicines after you have completely understood and accpet all the possible adverse reactions/side effects.   Do not drive, operating heavy machinery, perform activities at heights, swimming or participation in water activities or provide baby sitting services if your were admitted for syncope or siezures until you have seen by Primary MD or a Neurologist and advised to do so again.  Do not drive when taking Pain medications.    Do not take more than prescribed Pain, Sleep and Anxiety Medications  Special Instructions: If you have smoked or chewed Tobacco  in the last 2 yrs please  stop smoking, stop any regular Alcohol  and or any Recreational drug use.  Wear Seat belts while driving.   Please note  You were cared for by a hospitalist during your hospital stay. If you have any questions about your discharge medications or the care you received while you were in the hospital after you are discharged, you can call the unit and asked to speak with the hospitalist on call if the hospitalist that took care of you is not available. Once you are discharged, your primary care physician will handle any further medical issues. Please note that NO REFILLS for any discharge medications will be authorized once you are discharged, as it is imperative that you return to your primary care physician (or establish a relationship with a primary care physician if you do not have one) for your aftercare needs so that they can reassess your need for medications and monitor your lab values.       Ureteral Stent Implantation, Care After Refer to this sheet in the next few weeks. These instructions provide you with information on caring for yourself after your procedure. Your health care provider may also give you more specific instructions. Your treatment has been planned according to current medical practices, but problems sometimes occur. Call your health care provider if you have any problems or questions after your procedure. WHAT TO EXPECT AFTER THE PROCEDURE You should be back to normal activity within 48 hours after the procedure. Nausea and vomiting may occur and are commonly the result of anesthesia. It is common to experience sharp pain  in the back or lower abdomen and penis with voiding. This is caused by movement of the ends of the stent with the act of urinating.It usually goes away within minutes after you have stopped urinating. HOME CARE INSTRUCTIONS Make sure to drink plenty of fluids. You may have small amounts of bleeding, causing your urine to be red. This is normal. Certain  movements may trigger pain or a feeling that you need to urinate. You may be given medicines to prevent infection or bladder spasms. Be sure to take all medicines as directed. Only take over-the-counter or prescription medicines for pain, discomfort, or fever as directed by your health care provider. Do not take aspirin, as this can make bleeding worse. Your stent will be left in until the blockage is resolved. This may take 2 weeks or longer, depending on the reason for stent implantation. You may have an X-ray exam to make sure your ureter is open and that the stent has not moved out of position (migrated). The stent can be removed by your health care provider in the office. Medicines may be given for comfort while the stent is being removed. Be sure to keep all follow-up appointments so your health care provider can check that you are healing properly. SEEK MEDICAL CARE IF:  You experience increasing pain.  Your pain medicine is not working. SEEK IMMEDIATE MEDICAL CARE IF:  Your urine is dark red or has blood clots.  You are leaking urine (incontinent).  You have a fever, chills, feeling sick to your stomach (nausea), or vomiting.  Your pain is not relieved by pain medicine.  The end of the stent comes out of the urethra.  You are unable to urinate.

## 2014-10-18 NOTE — Progress Notes (Signed)
5 Days Post-Op Subjective:  Pt doing well. Minimal bladder or flank pain from stent.   Objective: Vital signs in last 24 hours: Temp:  [98.7 F (37.1 C)-99 F (37.2 C)] 99 F (37.2 C) (05/22 2052) Pulse Rate:  [83-87] 83 (05/22 2052) Resp:  [16] 16 (05/22 2052) BP: (106-124)/(54-55) 124/55 mmHg (05/22 2052) SpO2:  [98 %-99 %] 98 % (05/22 2052)  Intake/Output from previous day: 05/22 0701 - 05/23 0700 In: 610 [P.O.:600] Out: 35 [Drains:35]  JP - 20 ml over prior 20 hrs ; now scant clear fluid in JP bulb.   Intake/Output this shift:    Physical Exam:  NAD Right JP drain d/c'd intact by me  Lab Results:  Recent Labs  10/16/14 0420 10/17/14 0438 10/18/14 0455  HGB 8.6* 7.9* 8.4*  HCT 27.2* 24.1* 25.8*   BMET  Recent Labs  10/17/14 0438 10/18/14 0455  NA 132* 132*  K 3.5 4.4  CL 96* 97*  CO2 29 28  GLUCOSE 102* 102*  BUN 8 6  CREATININE 0.94 0.94  CALCIUM 7.5* 7.8*   No results for input(s): LABPT, INR in the last 72 hours. No results for input(s): LABURIN in the last 72 hours. Results for orders placed or performed during the hospital encounter of 10/11/14  Culture, blood (routine x 2)     Status: None   Collection Time: 10/11/14  8:30 PM  Result Value Ref Range Status   Specimen Description BLOOD RAC  Final   Special Requests BOTTLES DRAWN AEROBIC AND ANAEROBIC 5CC  Final   Culture   Final    PEPTOSTREPTOCOCCUS SPECIES Note: Gram Stain Report Called to,Read Back By and Verified With: JANELL GARLAND @2040  10/12/14 Performed at [facility]    Report Status 10/14/2014 FINAL  Final  Culture, blood (routine x 2)     Status: None   Collection Time: 10/11/14  8:35 PM  Result Value Ref Range Status   Specimen Description BLOOD RWRIST  Final   Special Requests BOTTLES DRAWN AEROBIC AND ANAEROBIC 5CC  Final   Culture   Final    PEPTOSTREPTOCOCCUS SPECIES Note: Gram Stain Report Called to,Read Back By and Verified With:  JANELL GARLAND @2040   10/12/14 Performed at [facility]    Report Status 10/14/2014 FINAL  Final  Urine culture     Status: None   Collection Time: 10/11/14 10:15 PM  Result Value Ref Range Status   Specimen Description URINE, CLEAN CATCH  Final   Special Requests NONE  Final   Colony Count   Final    30,000 COLONIES/ML Performed at [facility]    Culture   Final    Multiple bacterial morphotypes present, none predominant. Suggest appropriate recollection if clinically indicated. Performed at [facility]    Report Status 10/12/2014 FINAL  Final  MRSA PCR Screening     Status: None   Collection Time: 10/12/14 11:12 AM  Result Value Ref Range Status   MRSA by PCR NEGATIVE NEGATIVE Final    Comment:        The GeneXpert MRSA Assay (FDA approved for NASAL specimens only), is one component of a comprehensive MRSA colonization surveillance program. It is not intended to diagnose MRSA infection nor to guide or monitor treatment for MRSA infections.   Culture, routine-abscess     Status: None   Collection Time: 10/12/14  3:44 PM  Result Value Ref Range Status   Specimen Description ABSCESS  Final   Special Requests Normal  Final   Gram Stain   Final    RARE WBC PRESENT, PREDOMINANTLY PMN NO SQUAMOUS EPITHELIAL CELLS SEEN NO ORGANISMS SEEN Performed at [facility]    Culture   Final    NO GROWTH 3 DAYS Performed at [facility]    Report Status 10/16/2014 FINAL  Final  Anaerobic culture     Status: None   Collection Time: 10/12/14  3:44 PM  Result Value Ref Range Status   Specimen Description ABSCESS  Final   Special Requests Normal  Final   Gram Stain   Final    RARE WBC PRESENT, PREDOMINANTLY PMN NO SQUAMOUS EPITHELIAL CELLS SEEN NO ORGANISMS SEEN Performed at [facility]    Culture   Final    NO ANAEROBES ISOLATED Performed at [facility]    Report Status 10/17/2014 FINAL  Final  Clostridium Difficile by PCR      Status: Abnormal   Collection Time: 10/15/14 12:36 PM  Result Value Ref Range Status   C difficile by pcr POSITIVE (A) NEGATIVE Final    Comment: CRITICAL RESULT CALLED TO, READ BACK BY AND VERIFIED WITH: Marylou Mccoy [id number] @ [place]   Culture, blood (routine x 2)     Status: None (Preliminary result)   Collection Time: 10/16/14  7:55 AM  Result Value Ref Range Status   Specimen Description BLOOD LEFT ARM  Final   Special Requests   Final    BOTTLES DRAWN AEROBIC AND ANAEROBIC 10CC BOTH BOTTLES   Culture   Final           BLOOD CULTURE RECEIVED NO GROWTH TO DATE CULTURE WILL BE HELD FOR 5 DAYS BEFORE ISSUING A FINAL NEGATIVE REPORT Performed at [facility]    Report Status PENDING  Incomplete  Culture, blood (routine x 2)     Status: None (Preliminary result)   Collection Time: 10/16/14  8:01 AM  Result Value Ref Range Status   Specimen Description BLOOD LEFT ARM  Final   Special Requests   Final    BOTTLES DRAWN AEROBIC AND ANAEROBIC 10CC BOTH BOTTLES   Culture   Final           BLOOD CULTURE RECEIVED NO GROWTH TO DATE CULTURE WILL BE HELD FOR 5 DAYS BEFORE ISSUING A FINAL NEGATIVE REPORT Performed at [facility]    Report Status PENDING  Incomplete    Studies/Results: No results found.  May 21 CT scan of the abdomen-near-complete resolution of right perinephric fluid.  Draining good place.  Right ureteral stent in good position in renal pelvis. I reviewed the images.   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 Etiology. I considered sending for JP Cr again, but given output decreased and there is scant fluid in JP now I don't feel it is necessary. We discussed nature, r/b of removing JP. Pt elected to proceed with JP removal. Pt on abx for several more days, therefore I felt now is as good a time as any to try and remove JP. Discussed possibility fluid could recollect.      LOS: 6 days   Kahli Mayon 10/18/2014, 10:13 AM

## 2014-10-18 NOTE — Progress Notes (Signed)
Patient discharged home, all discharge medications and instructions reviewed and questions answered. Patient to be assisted to vehicle by wheelchair.  

## 2014-10-22 LAB — CULTURE, BLOOD (ROUTINE X 2)
CULTURE: NO GROWTH
Culture: NO GROWTH

## 2014-11-01 ENCOUNTER — Other Ambulatory Visit (HOSPITAL_BASED_OUTPATIENT_CLINIC_OR_DEPARTMENT_OTHER): Payer: BC Managed Care – PPO | Admitting: *Deleted

## 2014-11-01 ENCOUNTER — Ambulatory Visit: Payer: BC Managed Care – PPO | Attending: Gynecologic Oncology | Admitting: Gynecologic Oncology

## 2014-11-01 ENCOUNTER — Other Ambulatory Visit (HOSPITAL_BASED_OUTPATIENT_CLINIC_OR_DEPARTMENT_OTHER): Payer: BC Managed Care – PPO

## 2014-11-01 VITALS — BP 125/72 | HR 100 | Temp 98.7°F | Resp 18 | Ht 64.0 in | Wt 151.4 lb

## 2014-11-01 DIAGNOSIS — N1330 Unspecified hydronephrosis: Secondary | ICD-10-CM | POA: Diagnosis not present

## 2014-11-01 DIAGNOSIS — C530 Malignant neoplasm of endocervix: Secondary | ICD-10-CM | POA: Insufficient documentation

## 2014-11-01 DIAGNOSIS — Z8541 Personal history of malignant neoplasm of cervix uteri: Secondary | ICD-10-CM | POA: Diagnosis not present

## 2014-11-01 DIAGNOSIS — R300 Dysuria: Secondary | ICD-10-CM | POA: Diagnosis not present

## 2014-11-01 DIAGNOSIS — R509 Fever, unspecified: Secondary | ICD-10-CM

## 2014-11-01 DIAGNOSIS — R5383 Other fatigue: Secondary | ICD-10-CM

## 2014-11-01 LAB — CBC WITH DIFFERENTIAL/PLATELET
BASO%: 0.7 % (ref 0.0–2.0)
Basophils Absolute: 0.1 10*3/uL (ref 0.0–0.1)
EOS ABS: 0.1 10*3/uL (ref 0.0–0.5)
EOS%: 1.4 % (ref 0.0–7.0)
HEMATOCRIT: 27.6 % — AB (ref 34.8–46.6)
HGB: 9.1 g/dL — ABNORMAL LOW (ref 11.6–15.9)
LYMPH%: 8.5 % — AB (ref 14.0–49.7)
MCH: 29.1 pg (ref 25.1–34.0)
MCHC: 32.9 g/dL (ref 31.5–36.0)
MCV: 88.5 fL (ref 79.5–101.0)
MONO#: 1.2 10*3/uL — ABNORMAL HIGH (ref 0.1–0.9)
MONO%: 14.5 % — AB (ref 0.0–14.0)
NEUT#: 6.1 10*3/uL (ref 1.5–6.5)
NEUT%: 74.9 % (ref 38.4–76.8)
Platelets: 495 10*3/uL — ABNORMAL HIGH (ref 145–400)
RBC: 3.12 10*6/uL — ABNORMAL LOW (ref 3.70–5.45)
RDW: 22 % — ABNORMAL HIGH (ref 11.2–14.5)
WBC: 8.1 10*3/uL (ref 3.9–10.3)
lymph#: 0.7 10*3/uL — ABNORMAL LOW (ref 0.9–3.3)

## 2014-11-01 LAB — URINALYSIS, MICROSCOPIC - CHCC
BILIRUBIN (URINE): NEGATIVE
GLUCOSE UR CHCC: NEGATIVE mg/dL
Ketones: NEGATIVE mg/dL
NITRITE: NEGATIVE
Protein: 30 mg/dL
Specific Gravity, Urine: 1.01 (ref 1.003–1.035)
UROBILINOGEN UR: 0.2 mg/dL (ref 0.2–1)
pH: 6 (ref 4.6–8.0)

## 2014-11-01 NOTE — Patient Instructions (Signed)
Your PET scan to evaluate for recurrent/persistent disease is scheduled for June 9 at [facility] at 1:30 pm for a 2 pm appointment.  Nothing to eat or drink 6 hours before your test but you may have water.  We will let you know the results of your PET scan, CBC, and urinalysis from today.  Please call for any questions or concerns.

## 2014-11-02 ENCOUNTER — Telehealth: Payer: Self-pay | Admitting: *Deleted

## 2014-11-02 NOTE — Telephone Encounter (Signed)
Per Joylene John, NP, patient notified of CBC results (WBC normal and mildly anemic but hemoglobin is improving) and that her urinalysis is not showing strong signs of an infection. Told patient that we will wait to get the urine culture back and give her a call if the culture shows an infection. No other questions or concerns voiced at this time. Patient appreciative of the call.

## 2014-11-03 LAB — URINE CULTURE

## 2014-11-03 NOTE — Telephone Encounter (Signed)
Per Joylene John, NP patient notified that urine culture did not show infection.

## 2014-11-04 ENCOUNTER — Encounter (HOSPITAL_COMMUNITY)
Admission: RE | Admit: 2014-11-04 | Discharge: 2014-11-04 | Disposition: A | Discharge status: Home / Self Care | Payer: BC Managed Care – PPO | Source: Ambulatory Visit | Attending: Gynecologic Oncology | Admitting: Gynecologic Oncology

## 2014-11-04 DIAGNOSIS — C530 Malignant neoplasm of endocervix: Secondary | ICD-10-CM

## 2014-11-04 LAB — GLUCOSE, CAPILLARY: Glucose-Capillary: 109 mg/dL — ABNORMAL HIGH (ref 65–99)

## 2014-11-04 MED ORDER — FLUDEOXYGLUCOSE F - 18 (FDG) INJECTION
8.3000 | Freq: Once | INTRAVENOUS | Status: AC | PRN
  Administered 2014-11-04: 8.3 via INTRAVENOUS

## 2014-11-05 ENCOUNTER — Encounter: Payer: Self-pay | Admitting: Gynecologic Oncology

## 2014-11-05 ENCOUNTER — Telehealth: Payer: Self-pay | Admitting: Gynecologic Oncology

## 2014-11-05 NOTE — Progress Notes (Signed)
New Patient Note: [name]  CC:  Chief Complaint  Patient presents with  . endocervical adenocarcinoma    Assessment/Plan:  [name]  is a 54 y.o.  year old woman with a history of stage IIB adenocarcinoma of the cervix, s/p primary chemoradiation, who has signs concerning for progression/recurrence with new right sided hydronephrosis.Recent hospitalization for urinary tract infection complicated by sepsis.  1/ Obtain PET/CT to evaluate for the response to primary therapy and to evaluate for new disease/progression that might be causing the new ureteral obstruction. If present we will have her seen by Dr Marko Plume to begin salvage chemotherapy 2/Evaluate urine culture for test of cure 3/ Anemia - check CBC  HPI: Stacy Webster is a 54 year old woman with a history of stage II B cervical adenocarcinoma. She was diagnosed with this condition in December of 2015. She was seen initially by Dr Genia Del in [place]. At the time of diagnosis she had positive lymph nodes in the pelvis on CT imaging. She was treated with primary chemo-radiation which was completed on April 8th. The patient reports completing the course of therapy on schedule with no delays.  She reports that when she completed therapy there was some concern by her treating physicians that her response had been incomplete. They had planned to wait until she was further out from therapy before obtaining a PET scan to evaluate for response.   In May, 2016 she developed febrile morbidity and manifestionas of sepsis. She presented to the [facility] ER and was admitted and noted to have a urinary tract infection and right ureteral obstruction which was new. She was treated with pressor support, IV antibiotics, and a right perinephric drain for the right perinephric abscess.    Interval History: Since being discharged from the hospital she has felt fatigued. She has had low grade fevers/elevated temperatures (<100 F). She  denies dysuria. She denies flank pain. She denies vaginal bleeding or discharge.  Current Meds:  Outpatient Encounter Prescriptions as of 11/01/2014  Medication Sig  . acetaminophen (TYLENOL) 500 MG tablet Take 500 mg by mouth every 6 (six) hours as needed.  . diphenoxylate-atropine (LOMOTIL) 2.5-0.025 MG per tablet Take 1 tablet by mouth. Can take up to 8 times daily  . lactose free nutrition (BOOST PLUS) LIQD Take 237 mLs by mouth 3 (three) times daily with meals.  Marland Kitchen LORazepam (ATIVAN) 1 MG tablet Take 1 mg by mouth every 6 (six) hours as needed (nausea).   . ondansetron (ZOFRAN-ODT) 8 MG disintegrating tablet Take 1 tablet (8 mg total) by mouth every 8 (eight) hours as needed for nausea or vomiting.  Marland Kitchen ADVAIR DISKUS 250-50 MCG/DOSE AEPB Inhale 1 puff into the lungs daily.   . [DISCONTINUED] cefUROXime (CEFTIN) 500 MG tablet Take 1 tablet (500 mg total) by mouth 2 (two) times daily with a meal.  . [DISCONTINUED] dicyclomine (BENTYL) 20 MG tablet   . [DISCONTINUED] metroNIDAZOLE (FLAGYL) 500 MG tablet Take 1 tablet (500 mg total) by mouth every 8 (eight) hours.  . [DISCONTINUED] ondansetron (ZOFRAN) 8 MG tablet   . [DISCONTINUED] prochlorperazine (COMPAZINE) 10 MG tablet    No facility-administered encounter medications on file as of 11/01/2014.    Allergy:  Allergies  Allergen Reactions  . Morphine And Related Rash    Per pt, morphine also makes her vomit  . Codeine Nausea Only  . Hydrocodone Nausea And Vomiting    Social Hx:   History   Social History  . Marital Status: Single  Spouse Name: N/A  . Number of Children: N/A  . Years of Education: N/A   Occupational History  . Not on file.   Social History Main Topics  . Smoking status: Never Smoker   . Smokeless tobacco: Not on file  . Alcohol Use: No  . Drug Use: No  . Sexual Activity: Not on file   Other Topics Concern  . Not on file   Social History Narrative    Past Surgical Hx:  Past Surgical History   Procedure Laterality Date  . Cholecystectomy    . Tonsillectomy    . Cystoscopy w/ ureteral stent placement Right 10/13/2014    Procedure: CYSTOSCOPY WITH RETROGRADE PYELOGRAM/URETERAL STENT PLACEMENT;  Surgeon: Festus Aloe, MD;  Location: [facility];  Service: Urology;  Laterality: Right;    Past Medical Hx:  Past Medical History  Diagnosis Date  . Cancer     uterine and cervical    Past Gynecological History:  Cervical cancer 2015. Preceding this the patient has no prior history of abnormal pap smears, and had been up to date with cervical cancer screening.  No LMP recorded. Patient is not currently having periods (Reason: Chemotherapy).  Family Hx: No family history on file.  Review of Systems:  Constitutional  Feels well,  fatigued  ENT Normal appearing ears and nares bilaterally Skin/Breast  No rash, sores, jaundice, itching, dryness Cardiovascular  No chest pain, shortness of breath, or edema  Pulmonary  No cough or wheeze.  Gastro Intestinal  No nausea, vomitting, or diarrhoea. No bright red blood per rectum, no abdominal pain, change in bowel movement, or constipation.  Genito Urinary  No frequency, urgency, dysuria,  see HPIMusculo Skeletal  No myalgia, arthralgia, joint swelling or pain  Neurologic  No weakness, numbness, change in gait,  Psychology  No depression, anxiety, insomnia.   Vitals:  Blood pressure 125/72, pulse 100, temperature 98.7 F (37.1 C), temperature source Oral, resp. rate 18, height 5\' 4"  (1.626 m), weight 151 lb 6.4 oz (68.675 kg), SpO2 100 %.  Physical Exam: WD in NAD Neck  Supple NROM, without any enlargements.  Lymph Node Survey No cervical supraclavicular or inguinal adenopathy Cardiovascular  Pulse normal rate, regularity and rhythm. S1 and S2 normal.  Lungs  Clear to auscultation bilateraly, without wheezes/crackles/rhonchi. Good air movement.  Skin  No rash/lesions/breakdown  Psychiatry  Alert and oriented to person,  place, and time  Abdomen  Normoactive bowel sounds, abdomen soft, non-tender and nonobese without evidence of hernia.  Back No CVA tenderness Genito Urinary  Vulva/vagina: Normal external female genitalia.  No lesions. No discharge or bleeding.  Bladder/urethra:  No lesions or masses, well supported bladder  Vagina: Loss of rugation and elasticity consistent with prior radiation. No visible or palpable nodules  Cervix: Grossly normal appearing, no lesions on ectocervix. Firm to palpate and right anterior extension of induration (possibly from disease). With manipulation there is release of purulent fluid from the cervical os.  Uterus: Small, minimally mobile, no parametrial involvement or nodularity but induration is palpable Rt>Lt in the parametrium.  Adnexa: No palpable masses. Rectal  Good tone, no masses no cul de sac nodularity.  Extremities  No bilateral cyanosis, clubbing or edema.   Donaciano Eva, MD  11/05/2014, 11:43 PM  Late entry from 11/01/14

## 2014-11-05 NOTE — Telephone Encounter (Signed)
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 I discussed that this is the location of the ureter and is the likely site of ureteral obstruction.   I am recommending we start salvage chemotherapy with cisplatin, paclitaxel and bevacizumab.  I will make a referral to our medical oncologist, Dr Marko Plume for as soon as possible.  Donaciano Eva, MD

## 2014-11-06 ENCOUNTER — Encounter: Payer: Self-pay | Admitting: Gynecologic Oncology

## 2014-11-09 ENCOUNTER — Ambulatory Visit (INDEPENDENT_AMBULATORY_CARE_PROVIDER_SITE_OTHER): Payer: BC Managed Care – PPO | Admitting: Internal Medicine

## 2014-11-09 ENCOUNTER — Encounter: Payer: Self-pay | Admitting: Internal Medicine

## 2014-11-09 VITALS — BP 136/78 | HR 114 | Temp 98.5°F | Ht 64.0 in | Wt 148.5 lb

## 2014-11-09 DIAGNOSIS — B9689 Other specified bacterial agents as the cause of diseases classified elsewhere: Secondary | ICD-10-CM | POA: Diagnosis not present

## 2014-11-09 DIAGNOSIS — A491 Streptococcal infection, unspecified site: Secondary | ICD-10-CM

## 2014-11-09 DIAGNOSIS — N10 Acute tubulo-interstitial nephritis: Secondary | ICD-10-CM

## 2014-11-09 DIAGNOSIS — A047 Enterocolitis due to Clostridium difficile: Secondary | ICD-10-CM | POA: Diagnosis not present

## 2014-11-09 DIAGNOSIS — A0472 Enterocolitis due to Clostridium difficile, not specified as recurrent: Secondary | ICD-10-CM

## 2014-11-09 NOTE — Progress Notes (Addendum)
Subjective:    Patient ID: Stacy Webster, female    DOB: [birth year], 54 y.o.   MRN: [id number]  HPI Comments: Stacy Webster is a 54 year old woman with PMH as below who was recently admitted 10/11/14-10/18/14 for sepsis/peptostreptococcus bacteremia due to perinephric abscess.  She was also found to have cdiff colitis.  Her main symptoms at that time were confusion and she had elevated temp ~104F for several days-weeks.  She had been treated twice for UTI two months prior to admission.  She only experienced diarrhea for a few days during and immediately after admission.  She required ICU treatment initially and transferred to the medical floor when she improved.  A right sided perinephric drain was placed by IR and removed by urology prior to discharge.  She was discharged home with 10 day course of Ceftin and 14 day course of Flagyl which she completed about 1 week ago.  Her fevers are improving and she has low grade temp around 51F on most afternoons.  She denies N/V, abdominal pain, back pain, diarrhea, decreased urination, increased frequency or hematuria.  The drain was removed and she will follow-up with urology in August.    Past Medical History  Diagnosis Date  . Cancer     uterine and cervical   Current Outpatient Prescriptions on File Prior to Visit  Medication Sig Dispense Refill  . acetaminophen (TYLENOL) 500 MG tablet Take 500 mg by mouth every 6 (six) hours as needed.    Marland Kitchen ADVAIR DISKUS 250-50 MCG/DOSE AEPB Inhale 1 puff into the lungs daily.   0  . diphenoxylate-atropine (LOMOTIL) 2.5-0.025 MG per tablet Take 1 tablet by mouth. Can take up to 8 times daily  0  . lactose free nutrition (BOOST PLUS) LIQD Take 237 mLs by mouth 3 (three) times daily with meals. 60 Can 0  . LORazepam (ATIVAN) 1 MG tablet Take 1 mg by mouth every 6 (six) hours as needed (nausea).   0  . ondansetron (ZOFRAN-ODT) 8 MG disintegrating tablet Take 1 tablet (8 mg total) by mouth every 8 (eight) hours as needed  for nausea or vomiting. 20 tablet 0   No current facility-administered medications on file prior to visit.    Review of Systems  Constitutional: Positive for appetite change, fatigue and unexpected weight change. Negative for fever.       Fevers are improving.  Her appetite and energy level have not been good since she started chemo/XRT and she notes a 47 pound weight loss in the past five months.  She was recently prescribed an appetite stimulant which she will start soon.  Respiratory: Negative for cough and shortness of breath.   Cardiovascular: Negative for chest pain and palpitations.  Gastrointestinal: Negative for nausea, vomiting, abdominal pain, diarrhea, constipation and blood in stool.       She is having more BMs per day than before she got sick (3-4) but they are well formed.  Genitourinary: Negative for dysuria, hematuria and difficulty urinating.  Neurological: Negative for headaches.       Filed Vitals:   11/09/14 1110  BP: 136/78  Pulse: 114  Temp: 98.5 F (36.9 C)  TempSrc: Oral  Height: 5\' 4"  (1.626 m)  Weight: 148 lb 8 oz (67.359 kg)    Objective:   Physical Exam  Constitutional: She is oriented to person, place, and time. She appears well-developed. No distress.  HENT:  Head: Normocephalic and atraumatic.  Mouth/Throat: Oropharynx is clear and moist. No oropharyngeal  exudate.  Eyes: Conjunctivae and EOM are normal. Pupils are equal, round, and reactive to light.  Neck: Neck supple.  Cardiovascular: Normal rate, regular rhythm and normal heart sounds.  Exam reveals no gallop and no friction rub.   No murmur heard. Pulmonary/Chest: Effort normal and breath sounds normal. No respiratory distress. She has no wheezes. She has no rales.  Abdominal: Soft. She exhibits no distension. There is no tenderness.  No CVAT  Musculoskeletal: Normal range of motion. She exhibits no edema or tenderness.  Neurological: She is alert and oriented to person, place, and time.  No cranial nerve deficit.  Skin: Skin is warm. She is not diaphoretic.  Psychiatric: She has a normal mood and affect. Her behavior is normal. Judgment and thought content normal.  Vitals reviewed.         Assessment & Plan:  Peptostreptococcus bacteremia in the setting of perinephric abscess: she has completed 10 day course of Ceftin.  Her fevers have resolved and she has no urinary symptoms.   There is no indication for further treatment.  She will follow-up with urology in two months.  She should continue to follow with PCP and alert PCP to any new symptoms or increasing fevers.  Follow-up with ID if needed.  Cdiff colitis:  Likely due to abx for UTI a few months prior to admission.  Increased normal BMs but no diarrhea currently.  She has completed 14 day course of Flagyl.  Would recommend prophylactic antibiotic for cdiff prevention if she requires abx in the future.  Addendum: I have seen and examined Stacy Webster with Dr. Duwaine Maxin who composed this note. Stacy Webster is undergoing therapy for cervical cancer. She has received chemotherapy and radiation therapy. Recent PET scan showed some continued tumor burden in the right pelvis. She developed ureteral obstruction and right hydronephrosis complicated by a perinephric abscess and Peptostreptococcus bacteremia. She has improved after abscess drainage and ureteral stent placement in several weeks of antibiotic therapy. Her most recent CT scan and urine culture suggest that this infection has been cured. She also had mild C. Difficile colitis. She received oral metronidazole and continued it for days beyond completing Ceftin. She is having more frequent stools than normal but no diarrhea. She's not having any nausea or vomiting. She describes some recent subjective fevers in the afternoon but her temperature has only been in the high 99 range. I asked her to call me if she begins to have worsening diarrhea or temperatures of 101 or greater. I  agree with observation off of antibiotics for now. If she requires systemic antibiotic therapy in the next 6 months I would suggest starting oral vancomycin 125 mg 4 times daily and taking it as long as she is on systemic antibiotics in one week beyond in an effort to prevent a more severe relapse of C. Difficile colitis.  Michel Bickers, MD [facility] [phone] pager   [phone] cell 11/10/2014, 9:19 AM

## 2014-11-10 ENCOUNTER — Encounter: Payer: Self-pay | Admitting: Gynecologic Oncology

## 2014-11-10 ENCOUNTER — Other Ambulatory Visit: Payer: BC Managed Care – PPO

## 2014-11-10 ENCOUNTER — Ambulatory Visit: Payer: BC Managed Care – PPO | Attending: Gynecologic Oncology | Admitting: Gynecologic Oncology

## 2014-11-10 ENCOUNTER — Encounter: Payer: Self-pay | Admitting: *Deleted

## 2014-11-10 VITALS — BP 127/71 | HR 120 | Temp 98.1°F | Resp 19 | Ht 64.0 in | Wt 150.1 lb

## 2014-11-10 DIAGNOSIS — N1330 Unspecified hydronephrosis: Secondary | ICD-10-CM | POA: Insufficient documentation

## 2014-11-10 DIAGNOSIS — C786 Secondary malignant neoplasm of retroperitoneum and peritoneum: Secondary | ICD-10-CM

## 2014-11-10 DIAGNOSIS — Z9049 Acquired absence of other specified parts of digestive tract: Secondary | ICD-10-CM | POA: Diagnosis not present

## 2014-11-10 DIAGNOSIS — N135 Crossing vessel and stricture of ureter without hydronephrosis: Secondary | ICD-10-CM | POA: Diagnosis not present

## 2014-11-10 DIAGNOSIS — Z923 Personal history of irradiation: Secondary | ICD-10-CM | POA: Insufficient documentation

## 2014-11-10 DIAGNOSIS — C530 Malignant neoplasm of endocervix: Secondary | ICD-10-CM | POA: Diagnosis present

## 2014-11-10 DIAGNOSIS — Z9221 Personal history of antineoplastic chemotherapy: Secondary | ICD-10-CM | POA: Insufficient documentation

## 2014-11-10 DIAGNOSIS — D649 Anemia, unspecified: Secondary | ICD-10-CM | POA: Diagnosis not present

## 2014-11-10 DIAGNOSIS — Z79818 Long term (current) use of other agents affecting estrogen receptors and estrogen levels: Secondary | ICD-10-CM | POA: Insufficient documentation

## 2014-11-10 NOTE — Progress Notes (Signed)
Followup Patient Note: [name]  CC:  Chief Complaint  Patient presents with  . endocervical adenocarcinoma  progressive cancer on imaging, discussion about test results.  Assessment/Plan:  [name]  is a 54 y.o.  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  1/ I had a 30 minute face to face counseling session with Stacy Webster and her friend to discuss the PET results from 11/04/14. We discussed that she has evidence of metastatic disease to the pelvic/abdominal retrocecal retroperitoneum. This is almost certainly causing her ureteral obstruction. She also has foci of cancer in her uterus and the mesenteric nodes from the right colon.  I discussed with Stacy Webster that this represents a systemic disease process, and therefore treatments such as radiation and surgery (which are local therapies) are not appropriate and would not be curative. I discussed that we recommend systemic therapy for systemic disease and am recommending cisplatin, paclitaxel and bevacizumab chemotherapy. I discussed that duration of therapy would be determined by response on imaging.   Stacy Webster was not interested in discussion prognosis at this point in time, and is approaching this salvage therapy with a goal of "getting rid of it". It will be important to gradually introduce her to the concept that progressive, metastatic cervical cancer is not curable, and that therapeutic goals are palliative.  2/Hx of septicemia and UTI from obstructed ureter and perinephric abscess - test of cure of urine from 11/01/14 was negative. Patient cleared by ID yesterday to be free of infection. Cleared from infectious  standpoint to start chemotherapy  3/ Anemia - rechecked CBC from 11/01/14 was reassuring at 9.1 (trending upwards). Continue FeSO4.  HPI: Stacy Webster is a 54 year old woman with a history of stage II B cervical adenocarcinoma. She was diagnosed with this condition in December of 2015. She was seen initially by Dr Genia Del in [place]. At the time of diagnosis she had positive lymph nodes in the pelvis on CT imaging. She was treated with primary chemo-radiation which was completed on April 8th. The patient reports completing the course of therapy on schedule with no delays.  She reports that when she completed therapy there was some concern by her treating physicians that her response had been incomplete. They had planned to wait until she was further out from therapy before obtaining a PET scan to evaluate for response.   In May, 2016 she developed febrile morbidity and manifestionas of sepsis. She presented to the [facility] ER and was admitted and noted to have a urinary tract infection and right ureteral obstruction which was new. She was treated with pressor support, IV antibiotics, and a right perinephric drain for the right perinephric abscess.   On June 9th, 2016 she underwent PET/CT to evaluate for recurrent disease as the cause of her new right ureteral obstruction (CT had not clearly shown recurrence/progression). It demonstrated:  There is irregular soft tissue thickening along the posterior wall of the cecum which is hypermetabolic. SUV max is 6.8. This is worrisome for tumor infiltration. There are also metabolically active small mesenteric lymph nodes near the right colon (largest node measures 7 mm). The uterus remains enlarged with marked dilatation of the endometrial canal which is filled with fluid. This is likely due to cervical  obstruction from the patient's cervical cancer. Several foci of hypermetabolism are noted in the myometrium. Could not exclude tumor implants. This  could be residual tumor or radiation change. There are small pelvic sidewall lymph nodes but they are not hypermetabolic. There are borderline enlarged inguinal lymph nodes which are hypermetabolic.   She was recommended to receive salvage chemotherapy with cisplatin, paclitaxel and bevacizumab.  Interval History: She is continuing to feel stronger at home since discharge.  Current Meds:  Outpatient Encounter Prescriptions as of 11/10/2014  Medication Sig  . acetaminophen (TYLENOL) 500 MG tablet Take 500 mg by mouth every 6 (six) hours as needed.  Marland Kitchen ADVAIR DISKUS 250-50 MCG/DOSE AEPB Inhale 1 puff into the lungs daily.   . diphenoxylate-atropine (LOMOTIL) 2.5-0.025 MG per tablet Take 1 tablet by mouth. Can take up to 8 times daily  . lactose free nutrition (BOOST PLUS) LIQD Take 237 mLs by mouth 3 (three) times daily with meals.  Marland Kitchen LORazepam (ATIVAN) 1 MG tablet Take 1 mg by mouth every 6 (six) hours as needed (nausea).   . ondansetron (ZOFRAN-ODT) 8 MG disintegrating tablet Take 1 tablet (8 mg total) by mouth every 8 (eight) hours as needed for nausea or vomiting.  . megestrol (MEGACE ES) 625 MG/5ML suspension take 5 milliliters by mouth once daily   No facility-administered encounter medications on file as of 11/10/2014.    Allergy:  Allergies  Allergen Reactions  . Morphine And Related Rash    Per pt, morphine also makes her vomit  . Codeine Nausea Only  . Hydrocodone Nausea And Vomiting    Social Hx:   History   Social History  . Marital Status: Single    Spouse Name: N/A  . Number of Children: N/A  . Years of Education: N/A   Occupational History  . Not on file.   Social History Main Topics  . Smoking status: Never Smoker   . Smokeless tobacco: Not on file  . Alcohol Use: No  . Drug Use: No  . Sexual Activity: Not on file   Other Topics Concern  . Not on file   Social History Narrative    Past Surgical Hx:  Past Surgical History  Procedure Laterality Date   . Cholecystectomy    . Tonsillectomy    . Cystoscopy w/ ureteral stent placement Right 10/13/2014    Procedure: CYSTOSCOPY WITH RETROGRADE PYELOGRAM/URETERAL STENT PLACEMENT;  Surgeon: Festus Aloe, MD;  Location: [facility];  Service: Urology;  Laterality: Right;    Past Medical Hx:  Past Medical History  Diagnosis Date  . Cancer     uterine and cervical    Past Gynecological History:  Cervical cancer 2015. Preceding this the patient has no prior history of abnormal pap smears, and had been up to date with cervical cancer screening.  No LMP recorded. Patient is not currently having periods (Reason: Chemotherapy).  Family Hx: History reviewed. No pertinent family history.  Review of Systems:  Constitutional  Feels well,  fatigued  ENT Normal appearing ears and nares bilaterally Skin/Breast  No rash, sores, jaundice, itching, dryness Cardiovascular  No chest pain, shortness of breath, or edema  Pulmonary  No cough or wheeze.  Gastro Intestinal  No nausea, vomitting, or diarrhoea. No bright red blood per rectum, no abdominal pain, change in bowel movement, or constipation.  Genito Urinary  No frequency, urgency, dysuria,  see HPIMusculo Skeletal  No myalgia, arthralgia, joint swelling or pain  Neurologic  No weakness, numbness, change in gait,  Psychology  No depression, anxiety, insomnia.   Vitals:  Blood pressure 127/71, pulse 120, temperature 98.1 F (36.7 C), temperature source Oral, resp. rate 19, height 5\' 4"  (1.626 m), weight 150 lb 1.6 oz (68.085 kg), SpO2 100 %.  Physical Exam: deferred.   Donaciano Eva, MD  11/10/2014, 11:04 AM  Late entry from 11/01/14

## 2014-11-11 ENCOUNTER — Other Ambulatory Visit: Payer: Self-pay | Admitting: *Deleted

## 2014-11-11 ENCOUNTER — Other Ambulatory Visit: Payer: Self-pay | Admitting: Oncology

## 2014-11-11 DIAGNOSIS — D509 Iron deficiency anemia, unspecified: Secondary | ICD-10-CM

## 2014-11-11 DIAGNOSIS — C539 Malignant neoplasm of cervix uteri, unspecified: Secondary | ICD-10-CM

## 2014-11-11 DIAGNOSIS — C530 Malignant neoplasm of endocervix: Secondary | ICD-10-CM

## 2014-11-12 ENCOUNTER — Ambulatory Visit (HOSPITAL_BASED_OUTPATIENT_CLINIC_OR_DEPARTMENT_OTHER): Payer: BC Managed Care – PPO | Admitting: Oncology

## 2014-11-12 ENCOUNTER — Other Ambulatory Visit: Payer: Self-pay | Admitting: Oncology

## 2014-11-12 ENCOUNTER — Other Ambulatory Visit (HOSPITAL_BASED_OUTPATIENT_CLINIC_OR_DEPARTMENT_OTHER): Payer: BC Managed Care – PPO

## 2014-11-12 ENCOUNTER — Telehealth: Payer: Self-pay | Admitting: Oncology

## 2014-11-12 ENCOUNTER — Encounter: Payer: Self-pay | Admitting: Oncology

## 2014-11-12 VITALS — BP 122/75 | HR 108 | Temp 98.4°F | Resp 18 | Ht 64.0 in | Wt 149.9 lb

## 2014-11-12 DIAGNOSIS — A419 Sepsis, unspecified organism: Secondary | ICD-10-CM

## 2014-11-12 DIAGNOSIS — R634 Abnormal weight loss: Secondary | ICD-10-CM | POA: Insufficient documentation

## 2014-11-12 DIAGNOSIS — C7989 Secondary malignant neoplasm of other specified sites: Secondary | ICD-10-CM | POA: Diagnosis not present

## 2014-11-12 DIAGNOSIS — N2889 Other specified disorders of kidney and ureter: Secondary | ICD-10-CM

## 2014-11-12 DIAGNOSIS — C786 Secondary malignant neoplasm of retroperitoneum and peritoneum: Secondary | ICD-10-CM

## 2014-11-12 DIAGNOSIS — J45909 Unspecified asthma, uncomplicated: Secondary | ICD-10-CM

## 2014-11-12 DIAGNOSIS — E46 Unspecified protein-calorie malnutrition: Secondary | ICD-10-CM

## 2014-11-12 DIAGNOSIS — N1330 Unspecified hydronephrosis: Secondary | ICD-10-CM

## 2014-11-12 DIAGNOSIS — C539 Malignant neoplasm of cervix uteri, unspecified: Secondary | ICD-10-CM

## 2014-11-12 DIAGNOSIS — C530 Malignant neoplasm of endocervix: Secondary | ICD-10-CM

## 2014-11-12 DIAGNOSIS — D509 Iron deficiency anemia, unspecified: Secondary | ICD-10-CM | POA: Diagnosis not present

## 2014-11-12 DIAGNOSIS — G893 Neoplasm related pain (acute) (chronic): Secondary | ICD-10-CM | POA: Insufficient documentation

## 2014-11-12 DIAGNOSIS — D500 Iron deficiency anemia secondary to blood loss (chronic): Secondary | ICD-10-CM

## 2014-11-12 DIAGNOSIS — N9489 Other specified conditions associated with female genital organs and menstrual cycle: Secondary | ICD-10-CM

## 2014-11-12 DIAGNOSIS — J45901 Unspecified asthma with (acute) exacerbation: Secondary | ICD-10-CM | POA: Insufficient documentation

## 2014-11-12 DIAGNOSIS — J4521 Mild intermittent asthma with (acute) exacerbation: Secondary | ICD-10-CM

## 2014-11-12 LAB — IRON AND TIBC CHCC
%SAT: 16 % — AB (ref 21–57)
Iron: 31 ug/dL — ABNORMAL LOW (ref 41–142)
TIBC: 199 ug/dL — ABNORMAL LOW (ref 236–444)
UIBC: 168 ug/dL (ref 120–384)

## 2014-11-12 LAB — CBC WITH DIFFERENTIAL/PLATELET
BASO%: 0.7 % (ref 0.0–2.0)
Basophils Absolute: 0 10*3/uL (ref 0.0–0.1)
EOS ABS: 0.1 10*3/uL (ref 0.0–0.5)
EOS%: 1.7 % (ref 0.0–7.0)
HCT: 28 % — ABNORMAL LOW (ref 34.8–46.6)
HGB: 9.1 g/dL — ABNORMAL LOW (ref 11.6–15.9)
LYMPH%: 7.8 % — ABNORMAL LOW (ref 14.0–49.7)
MCH: 29.3 pg (ref 25.1–34.0)
MCHC: 32.7 g/dL (ref 31.5–36.0)
MCV: 89.7 fL (ref 79.5–101.0)
MONO#: 0.8 10*3/uL (ref 0.1–0.9)
MONO%: 12.3 % (ref 0.0–14.0)
NEUT%: 77.5 % — ABNORMAL HIGH (ref 38.4–76.8)
NEUTROS ABS: 5.2 10*3/uL (ref 1.5–6.5)
PLATELETS: 540 10*3/uL — AB (ref 145–400)
RBC: 3.12 10*6/uL — AB (ref 3.70–5.45)
RDW: 21.5 % — ABNORMAL HIGH (ref 11.2–14.5)
WBC: 6.7 10*3/uL (ref 3.9–10.3)
lymph#: 0.5 10*3/uL — ABNORMAL LOW (ref 0.9–3.3)

## 2014-11-12 LAB — COMPREHENSIVE METABOLIC PANEL (CC13)
ALK PHOS: 72 U/L (ref 40–150)
ALT: 13 U/L (ref 0–55)
ANION GAP: 8 meq/L (ref 3–11)
AST: 15 U/L (ref 5–34)
Albumin: 2.6 g/dL — ABNORMAL LOW (ref 3.5–5.0)
BILIRUBIN TOTAL: 0.27 mg/dL (ref 0.20–1.20)
BUN: 16.9 mg/dL (ref 7.0–26.0)
CO2: 30 meq/L — AB (ref 22–29)
CREATININE: 0.9 mg/dL (ref 0.6–1.1)
Calcium: 9.5 mg/dL (ref 8.4–10.4)
Chloride: 99 mEq/L (ref 98–109)
EGFR: 78 mL/min/{1.73_m2} — ABNORMAL LOW (ref >90)
Glucose: 142 mg/dl — ABNORMAL HIGH (ref 70–140)
POTASSIUM: 4.2 meq/L (ref 3.5–5.1)
SODIUM: 137 meq/L (ref 136–145)
TOTAL PROTEIN: 7.8 g/dL (ref 6.4–8.3)

## 2014-11-12 LAB — FERRITIN CHCC: Ferritin: 578 ng/ml — ABNORMAL HIGH (ref 9–269)

## 2014-11-12 LAB — MAGNESIUM (CC13): MAGNESIUM: 2 mg/dL (ref 1.5–2.5)

## 2014-11-12 MED ORDER — ONDANSETRON 8 MG PO TBDP: 8.0000 mg | ORAL_TABLET | Freq: Three times a day (TID) | ORAL | Status: DC | PRN

## 2014-11-12 MED ORDER — DEXAMETHASONE 4 MG PO TABS: ORAL_TABLET | ORAL | Status: DC

## 2014-11-12 MED ORDER — LORAZEPAM 1 MG PO TABS: ORAL_TABLET | ORAL | Status: DC

## 2014-11-12 NOTE — Telephone Encounter (Signed)
Gave avs & calendar for June/July.  °

## 2014-11-12 NOTE — Patient Instructions (Addendum)
We will send prescriptions to your pharmacy   1.decadron (dexamethasone, steroid) 4 mg. Take five tablets +(=20 mg) with food 12 hrs before taxol chemotherapy and five tablets with food 6 hrs before taxol. First chemo will be 0900 on Friday 6-24, so need to eat a little and take 5 tablets at ~ 9 PM on 6-23, then eat a little and take 5 tablets ~ 3 AM on 6-24  2.zofran (ondansetron) 8mg  One tablet every 8 hrs as needed for nausea. Will not make you drowsy. Fine to take one tablet AM after chemo whether or not any nausea then, to extend coverage for nausea a bit longer. Other than that dose, fine to take just as needed for nausea   3.ativan (lorazepam) 1 mg. One tablet swallow or dissolve under tongue every 6 hrs as needed for nausea. WIll make you drowsy and a little forgetful around each dose. Fine to take one tablet at bedtime night of chemo whether or not any nausea.   You can call any time if needed [phone]

## 2014-11-12 NOTE — Progress Notes (Signed)
[facility] NEW PATIENT EVALUATION   Name: Stacy Webster Date: November 12, 2014  MRN: [id number] DOB: [birth year]  REFERRING PHYSICIAN: Everitt Amber cc [name], MD, Michel Bickers, Leonidas Romberg, Genia Del, Herma Ard  Outside records reviewed in detail by MD for this consultation.  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  Situation has complicated by recent urosepsis, perinephric fluid collection requiring percutaneous drainage, right hydronephrosis requiring stent, and C difficile diarrhea.    HISTORY OF PRESENT ILLNESS:Stacy Webster is a 54 y.o. female who is seen in consultation, together with her sister in law, at the request of  Dr Everitt Amber for reason as above.   Patient had been in usual good health, with no previous abnormal PAP smears, when she developed frequent urination and vaginal bleeding in late 2015. She saw PCP Dr Brigitte Pulse, then had endometrial biopsy by Dr Janyth Pupa [phone]) with adenocarcinoma. PET in [place] system 06-16-39 had hypermetabolic uptake within the uterine cervix and lower uterine segment and   metastatic adenopathy within the bilateral pelvis and periaortic retroperitoneum.       Dr Genia Del and Dr Marguerita Merles treated with pelvic and para-aortic radiation given with 6 cycles of sensitizing CDDP, and HDR x5 from 07-06-14 thru 09-03-14. Patient had a difficult time with nausea, vomiting, diarrhea and hypokalemia/ hypomagnesemia thru treatment. Patient used ODT zofran and ativan during chemotherapy. She had a difficult time doing oral prehydration for CDDP. She had no peripheral neuropathy with the CDDP. Chemotherapy information is not available at time of this consultation and will be requested; patient does not know if she received EMEND with CDDP. There was concern by completion  of the treatment that she did not have optimal response, with plan for repeat evaluation at 6 weeks from completion of the treatment. Prior to planned reevaluation, patient began having daily fevers for possibly 4 weeks, as high as 102 degrees x 2 weeks prior to 10-11-14; by presentation at ED she also had increased right flank pain. She had [facility] at [facility] on 10-08-14, this compared with PET of 05-2014 showed increase in subcapsular fluid collection right kidney and right hydronephrosis, increase in size of endometrial canal/ uterus/ right adnexa, and improvement in retroperitoneal, common iliac and bilateral pelvic sidewall adenopathy.  Symptoms worsened and she was admitted thru [facility] ED to ICU on 5-16/ 10-12-14 with sepsis from peptostreptococcus. She had percutaneous drainage by IR of 635 cc dark thin fluid from right perinephric area on 10-12-14, with perinephric drain removed by urology on 10-18-14. She had right ureteral stent placement by Dr Junious Silk on 10-13-14. She developed C.difficile diarrhea, begun on flagyl 10-15-14. She was discharged home on 10-18-14 on ceftin and flagyl x 2 weeks, now completed. Patient understands that she is to have the ureteral stent changed in 3 months.  She saw Dr [name] in consultation on 11-05-14, with cervix grossly normal appearance, firm to palpation with right anterior extension, and purulent material from cervical os with manipulation, no palpable adnexal masses, induration R>L parametrium. PET 11-29-06 had hypermetabolic tissue thickening at cecum, + small right sided mesenteric and inguinal nodes, foci in myometriium and in region of cervix, small right perinephric fluid collection and dilated fluid filled endometrium. Dr [name] saw her following the PET, with recommendation for systemic treatment of the metastatic disease with CDDP, taxol and avastin. Patient attended chemotherapy teaching  class for these agents prior to my visit. She had follow up  with Dr Michel Bickers on 11-09-14, felt stable from ID standpoint for chemotherapy, recommendations for covering C diff if additional antibiotics needed, and prn follow up with him now. Urine culture from 11-04-14 and blood cultures from 10-16-14 all no growth.   REVIEW OF SYSTEMS Weight loss 47 lbs Feb to May 2016 Still more weak overall than baseline, able to do light activity at home with frequent resting now. Temperatures after DC were initially 99-101 in afternoons, now ~ 99 around 3-4 PM, at which time she generally takes ibuprofen. No localizing symptoms of infection. Appetite has not been good, tho she ate more yesterday and is drinking Boost clear 1-2 daily. Metallic taste with chemo previously. She has had "dull ache" in right groin since hospital which she does not feel is stent related; she denies any symptoms with stent tho she has baseline frequent urination. No hematuria, no other bleeding. No SOB, cough, chest pain. Some HA in afternoons this week, which are new, no other neurologic symptoms. Occasional environmental allergies, uses prn claritin tho has not needed this recently. Ocasional GERD, uses OTC meds. No nausea or vomiting. Bowels now formed including today. Hemorrhoids, previously had BMs only ~ weekly. No dental problems known. No difficulty hearing. Wears reading glasses, has cataracts, no thyroid disease. No cardiac symptoms. No changes in breasts. No LE swelling. No hx blood clots. No arthritis.  Peripheral venous access has been good.  Remainder of full 10 point review of systems negative.      Radiation Summary:  Date Type Department Care Team Description  09/09/2014 Documentation Only [facility] Radiation Oncology  [place], [place] [address]  [phone]  Marguerita Merles, MD  [address]  [place], [place] [address]  [phone]  [phone] (Fax)    Progress Notes Marguerita Merles, MD - 09/03/2014 3:41 PM EDT Radiation  Oncology End-of-Treatment Summary  Date of Service: 09/03/2014  Diagnosis: Clinical sage [id number] adenocarcinoma of the cervix  History. 54 yo wf with cervical adenocarcinoma. She presented with an enlarged cervix at 5-6 cm with left parametrial thickening. PET scan was positive in the cervix/LUS and also in several pelvic and para-aortic nodes. She has completed a combined course of chemo with pelvic and para-aortic RT and5 HDR tandem and ovoid insertions. This report summaries her treatment course.  Treatment dates: 07/06/2014-09/03/2014  Anatomic region treated:  1. Pelvis 2. Para-aortics 3. Pelvis with midline block 4. PA nodal boost 5. Pelvic nodal boost 6. Cervix with HDR tandem and ovoid insertions  Dose:  1.4500cGY/2fxs 2. 4500cGY/18fxs 3. 540cGY/15fxs 4. 1080cGY/67fxs 5. 540cGY/69fxs 6. Each insertion delivered [phone], and 608cGY to Point A  Cumulative dose to pet avid adenopathy was 5580cGY/69fxs. Cumulative dose to Point B was 58GY on the right and 57GY on the left. Total dose to Point A was 85GY on the right and left side in low dose rate equivalent.  Beam energies:  1.15MV photon energy 2. 15 MV photon energy 3. 15 MV photon energy 4. 15 MV photon energy 5. 15 MV photon energy 6. HDR Iridium 192 source  Beam arrangement:  1.4 field conformal 2. 4 field conformal. 3. AP/PA with a midline block 4. 3 field conformal 5. 4 field conformal 6. Tandem and ovoid brachytherapy with a 6 cm tandem and mini-ovoids  Treatment course: Amijah had a difficult time throughout the treatment course. She struggled with nausea and vomiting and diarrhea. She had  a 30lb weight loss. At the time of her 4th brachy procedure she was quite weak and noted to have low potassium and mag on blood work      ALLERGIES: Morphine and related; Codeine; and Hydrocodone all cause nausea  PAST MEDICAL/ SURGICAL HISTORY:    Cholecystectomy ~ 12 years ago C section G3P3 Occasional  asthma Tonsillectomy [place] 12-16-13 Echocardiogram 10-13-14 with EF 55-60%, no valvular abnormalities Transfused PRBCs 10-12-14  CURRENT MEDICATIONS: reviewed as listed now in EMR. She did NOT begin megace for appetite (prescribed 11-05-14). Discussed claritin for taxol and gCSF aches. Prescriptions for zofran ODT, ativan, decadron.  PHARMACY  [facility] [place]   SOCIAL HISTORY:  Originally from [place], married, lives with husband , 60 yo son and 38 yo daughter at home this summer (in college at [place]); oldest son age 22. 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 No tobacco, no significant tobacco. Husband works third shift. Siblings and parents live nearby, lots of support  FAMILY HISTORY:  Parents age 68, mother with some early dementia. Diabetes in maternal relatives. Siblings healthy. No cancer known in family.          PHYSICAL EXAM:  height is 5\' 4"  (1.626 m) and weight is 149 lb 14.4 oz (67.994 kg). Her oral temperature is 98.4 F (36.9 C). Her blood pressure is 122/75 and her pulse is 108. Her respiration is 18.  Alert, pleasant, cooperative lady, looks stated age, slowly ambulatory, good historian. Not in acute discomfort. Sister in law very supportive and helpful with history.   HEENT: normal hair pattern. PERRL, not icteric. Oral mucosa moist and clear, no thrush or lesions. Good dentition. Neck supple without JVD or thyroid mass.   RESPIRATORY: lungs clear to A and P  CARDIAC/ VASCULAR: heart RRR, clear heart sounds, no gallop. Peripheral pulses intact and symmetrical  ABDOMEN: soft, not tender, not distended, normally active BS. Still minimal RT skin dryness and slight hyperpigmentation. No HSM or mass  LYMPH NODES: no cervical, supraclavicular, axillary or inguinal adenopathy  BREASTS:bilaterally without dominant mass, skin  or nipple findings  NEUROLOGIC: speech fluent and appropriate. CN, motor, sensory, cerebellar nonfocal. PSYCH appropriate mood and affect  SKIN: without rash, ecchymoses, petechiae  MUSCULOSKELETAL: back not tender. LE no edema, cords, tenderness. Symmetrical muscle mass    LABORATORY DATA:  Results for orders placed or performed in visit on 11/12/14 (from the past 48 hour(s))  CBC with Differential     Status: Abnormal   Collection Time: 11/12/14 10:11 AM  Result Value Ref Range   WBC 6.7 3.9 - 10.3 10e3/uL   NEUT# 5.2 1.5 - 6.5 10e3/uL   HGB 9.1 (L) 11.6 - 15.9 g/dL   HCT 28.0 (L) 34.8 - 46.6 %   Platelets 540 (H) 145 - 400 10e3/uL   MCV 89.7 79.5 - 101.0 fL   MCH 29.3 25.1 - 34.0 pg   MCHC 32.7 31.5 - 36.0 g/dL   RBC 3.12 (L) 3.70 - 5.45 10e6/uL   RDW 21.5 (H) 11.2 - 14.5 %   lymph# 0.5 (L) 0.9 - 3.3 10e3/uL   MONO# 0.8 0.1 - 0.9 10e3/uL   Eosinophils Absolute 0.1 0.0 - 0.5 10e3/uL   Basophils Absolute 0.0 0.0 - 0.1 10e3/uL   NEUT% 77.5 (H) 38.4 - 76.8 %   LYMPH% 7.8 (L) 14.0 - 49.7 %   MONO%  12.3 0.0 - 14.0 %   EOS% 1.7 0.0 - 7.0 %   BASO% 0.7 0.0 - 2.0 %    CMET available during visit normal with exception of CO2 30, glu 142, alb 2.6, including K 4.2 and creatinine 0.9 Mg 2.0 All labs above reviewed with patient during visit  Iron studies available after visit with serum iron 31 and %sat 16 Ferritin 578   PATHOLOGY: Surgical Pathology Tissue Request1/11/2014  [facility]  Result Narrative  [id number]   DIAGNOSIS   CERVIX, BIOPSY:  WELL TO MODERATELY DIFFERENTIATED ENDOCERVICAL ADENOCARCINOMA,  PROBABLY INVASIVE. (SEE COMMENT)  ([facility])   COMMENT  IMMUNOHISTOCHEMICALLY, THE NEOPLASTIC CELLS APPEAR AT LEAST FOCALLY  POSITIVE FOR MONOCLONAL CEA BUT NEGATIVE FOR P16 AND ESTROGEN  RECEPTORS.  THESE FINDINGS ALONG WITH THE MORPHOLOGIC FEATURES ARE MOST  COMPATIBLE WITH ENDOCERVICAL ORIGIN.  DRS. [place].     Nilda Simmer MD   PATHOLOGIST  (CASE SIGNED 06/08/2014)   CLINICAL INFORMATION   ENDOCERVICAL VERSUS ENDOMETRIAL CANCER. PLEASE PERFORM HPV BY PCR AND  P16 ON SPECIMENS. R87.619   SPECIMEN  CERVIX-BIOPSY   GROSS DESCRIPTION  Charles Miao; CERVIX.  RECEIVED ARE MULTIPLE TAN-PINK TO RED-BROWN SOFT  TISSUE PIECE(S), UP TO 0.6 CM IN MAXIMUM DIMENSION.  ALL IN ONE,  MULTIPLE PIECES.    (MG)   ([facility]K/PCS)   THE ABOVE DIAGNOSIS IS BASED ON PERFORMANCE OF THOROUGH GROSS AND/OR  MICROSCOPIC EVALUATIONS.  IMMUNOPEROXIDASE PROCEDURES WERE DEVELOPED AND  PERFORMANCE CHARACTERISTICS DETERMINED BY PATHOLOGISTS DIAGNOSTIC  LABORATORY.  NOT ALL HAVE BEEN CLEARED OR APPROVED BY THE U.S. FOOD AND  DRUG ADMINISTRATION.    Performed by: [facility] [address] [place], [place], [place] [address]      [place], [place]: 05/25/2014 Client: [facility] Accession: [id number] Received: 05/26/2014 Janyth Pupa, DO REPORTINAL DIAGNOSIS Diagnosis Endometrium, biopsy - ADENOCARCINOMA. - SEE COMMENT. Microscopic Comment The fragments consist of atrophic appearing endometrium harboring malignant cells consistent with adenocarcinoma. I favor that the vast majority of the phenotype is endometrioid, although there are foci which have a more papillary architecture, and therefore may represent a serous component. It is difficult to grade the tumor due to the atrophic nature of the specimen, although the features favor a lower grade process.     RADIOGRAPHY:  NUCLEAR MEDICINE PET SKULL BASE TO THIGH    11-04-14  FINDINGS: NECK  No hypermetabolic lymph nodes in the neck.  CHEST  No hypermetabolic mediastinal or hilar nodes. No suspicious pulmonary nodules on the CT scan.  ABDOMEN/PELVIS  No findings for solid abdominal organ metastatic disease. Stable mild right-sided hydronephrosis despite a double-J ureteral stent. The perinephric fluid collection on the right side  is small and appears simple. No evidence for recurrent hemorrhage. There is irregular soft tissue thickening along the posterior wall of the cecum which is hypermetabolic. SUV max is 6.8. This is worrisome for tumor infiltration.  There are also metabolically active small mesenteric lymph nodes near the right colon.  The largest node measures 7 mm on image number 136 and SUV max is 3.6.  The 4.5 mm node on image 138 has SUV max of 1.8.  A 6 mm node on image number 131 has SUV max of 3.5.  The uterus remains enlarged with marked dilatation of the endometrial canal which is filled with fluid. This is likely due to cervical obstruction from the patient's cervical cancer. There is a large calcified uterine fibroid on the left side. Several foci of hypermetabolism  are noted in the myometrium. Could not exclude tumor implants. These are in the 4-6 SUV max range. Is also mild uptake in the region of the cervix with SUV max of 3.8. This could be residual tumor or radiation change.  The bladder is decompressed. There is mild diffuse bladder wall thickening. A ureteral stent tip is noted in the bladder. There are small pelvic sidewall lymph nodes but they are not hypermetabolic.  There are borderline enlarged inguinal lymph nodes which are hypermetabolic. A 14 mm node on image number 178 has an SUV max of 6.0 a smaller node more inferiorly measures 8 mm and SUV max is 4.3. A small node on the left side has an SUV max of 2.66.  SKELETON  Diffuse osseous uptake is likely due to chemotherapy or marrow stimulating drugs.  IMPRESSION: 1. No findings for metastatic disease involving the chest. 2. Irregular soft tissue thickening along the posterior wall of the cecum is hypermetabolic and worrisome for infiltrating tumor. 3. Metabolically active small right-sided mesenteric lymph nodes are worrisome for tumor involvement. Similar metabolically active inguinal lymph nodes are  worrisome for tumor. 4. Several foci of hypermetabolism are noted in the myometrium. This is an indeterminate finding but could not exclude tumor implants. This also mild hypermetabolism in the region of the cervix which could be residual tumor or radiation change. 5. Persistent dilated fluid-filled endometrium likely due to cervical occlusion. 6. Stable right-sided double-J ureteral stent and small perinephric fluid collection  PACS images for PET reviewed with patient and sister in law now.    CT ABDOMEN WITHOUT CONTRAST   10-16-14  COMPARISON: Prior CT scan 10/12/2014  FINDINGS: Lower Chest: The lung bases are clear. Visualized cardiac structures are within normal limits for size. No pericardial effusion. Unremarkable visualized distal thoracic esophagus.  Abdomen: Unenhanced CT was performed per clinician order. Lack of IV contrast limits sensitivity and specificity, especially for evaluation of abdominal/pelvic solid viscera. Within these limitations, unremarkable CT appearance of the stomach, duodenum, spleen, adrenal glands and pancreas. Normal hepatic contour morphology. No discrete hepatic lesion. Gallbladder is surgically absent. No intra or extrahepatic biliary ductal dilatation.  Near-total interval resolution of the large right subcapsular perinephric fluid collection. The percutaneous drainage catheter is well positioned just posterior to the kidney. There is trace residual subcapsular fluid along the lateral inferior margin of the kidney measuring only up to 8 mm in width. There is significantly reduced mass effect on the kidney. No evidence of nephrolithiasis. Continued mild prominence of the right renal collecting system with an incompletely imaged right double-J ureteral stent in place. The renal collecting system is now water attenuation compared to high attenuation as seen previously. Unremarkable left kidney.  Very mild perinephric stranding  bilaterally which is nonspecific. No evidence of bowel obstruction or focal bowel wall thickening in the visualized portions of the bowel.  Bones/Soft Tissues: No acute fracture or aggressive appearing lytic or blastic osseous lesion.  Vascular: Limited evaluation in the absence of IV contrast. No aneurysm.  IMPRESSION: 1. Near-total interval resolution of the large right subcapsular perinephric fluid collection status post percutaneous drainage. The drainage catheter remains well positioned. There is significantly less mass effect on the renal parenchyma. A very small amount of residual subcapsular fluid is noted inferiorly and laterally. If the drain output is significantly reduced (no more than 10-20 mL daily) the percutaneous drain can likely be removed. 2. Persistent fullness of the right renal collecting system with an incompletely imaged double-J ureteral stent in  place. The renal collecting system is now low in attenuation rather than high attenuation is previously seen suggesting resolution of blood products    RENAL / URINARY TRACT ULTRASOUND COMPLETE  10-12-14  COMPARISON: CT of the abdomen and pelvis performed earlier today at 1:22 a.m.  FINDINGS: Right Kidney:  Length: 13.6 cm. The previously noted large right-sided perinephric fluid collection has resolved. The associated drainage catheter is noted adjacent to the right kidney. There is underlying persistent severe right-sided hydronephrosis. The nature of the fluid within the right renal collecting system is not well assessed on ultrasound.  Left Kidney:  Length: 14.1 cm. Echogenicity within normal limits. No mass or hydronephrosis visualized.  Bladder:  Decompressed, with a Foley catheter in place.  The uterus is enlarged, as noted on prior CT, measuring 12.3 x 8.9 x 9.4 cm, and fluid echogenicity is noted filling the endometrial echo complex, measuring 7.3 x 6.4 cm.  IMPRESSION: 1.  Interval resolution of previously noted large right-sided perinephric fluid collection. Associated drainage catheter now seen adjacent to the right kidney. 2. Underlying persistent severe right-sided hydronephrosis. The nature of the fluid within the right renal collecting system is not well assessed on ultrasound. 3. Enlarged uterus again noted, with fluid echogenicity filling the endometrial echo complex. As noted on CT, this could reflect postradiation necrosis, underlying malignancy or possibly obstruction at the level of the cervix      CT ABDOMEN AND PELVIS WITH CONTRAST   10-12-14  COMPARISON: None.  FINDINGS: The visualized lung bases are clear.  The liver and spleen are unremarkable in appearance. The patient is status post cholecystectomy, with clips noted along the gallbladder fossa. The pancreas and adrenal glands are unremarkable.  A large amount of blood is noted within the right renal calyces and right renal pelvis, and extending along the course of the right ureter. 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 This likely reflects ureteral obstruction, with blood filling the right renal collecting system.  The left kidney is unremarkable in appearance.  The small bowel is unremarkable in appearance. The stomach is within normal limits. No acute vascular abnormalities are seen.  The appendix is not definitely characterized; there is no evidence of appendicitis. Contrast progresses to the level of the rectum. The colon is unremarkable in appearance.  There is diffuse soft tissue stranding and fluid within the pelvis. This may reflect postradiation change, or possibly acute infection or inflammation. This tracks about the adjacent distal ileum.  The uterus is enlarged, and contains central fluid attenuation measuring 8.8 cm in size. This may reflect the underlying malignancy, or  postradiation necrosis. An underlying calcified fibroid is seen on the left. Heterogeneous attenuation is noted extending to the cervix, and there is diffuse soft tissue attenuation extending into the right side of the base of the bladder. This raises concern for tumor infiltration, though postradiation change could have a similar appearance. This likely explains right ureteral obstruction, as described above.  A 4.4 cm cystic focus is noted at the right ovary, likely physiologic in nature.  The bladder is mildly distended. Mild surrounding soft tissue inflammation may reflect postradiation change or possibly cystitis. No inguinal lymphadenopathy is seen.  No acute osseous abnormalities are identified. There is chronic loss of height involving the superior endplate of L3.  IMPRESSION: 1. Large amount of blood noted within the right renal calyces and right renal pelvis, extending along the course of the right ureter. 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 This likely reflects ureteral obstruction, with blood filling the right renal collecting system. 2. Right ureteral obstruction appears to reflect diffuse soft tissue attenuation extending into the right side of the base of bladder. This raises concern for tumor infiltration, though postradiation change could have a similar appearance. 3. Enlarged uterus, containing central fluid attenuation, measuring 8.8 cm. This may reflect the underlying malignancy, or postradiation necrosis. Heterogeneous attenuation extends to the cervix. 4. Diffuse soft tissue inflammation and fluid within the pelvis. This may reflect postradiation change, or possibly acute infection or inflammation. This tracks about the adjacent distal ileum. 5. Mild soft tissue inflammation about the bladder may reflect postradiation change or possibly cystitis.     CT Chest Abdomen Pelvis W Contrast5/13/2016   [facility]  Result Impression  IMPRESSION: 1. Significant increase in size of the endometrial canal compared to the prior study. Overall uterine size is also increased 2. Increase of subcapsular fluid collection in the right kidney and increase in right-sided hydronephrosis 3. Decrease in size of retroperitoneal abdominal nodes which now measure less than 1 cm in short axis diameter 4. Enlarged right common iliac node which is decreased in size compared to the prior 5. Bilateral pelvic sidewall lymph nodes have decreased in size and now measure less than 1 cm in short axis diameter 6. Interval enlargement of the right adnexa   Result Narrative  INDICATION: C53.9: Malignant neoplasm of cervix uteri, unspecified  TECHNIQUE: CT CHEST ABDOMEN PELVIS W CONTRAST.  80 mL Isovue 370 Exam date/time: 10/08/2014 2:14 PM.  Comparison PET scan 06/08/2014.  FINDINGS: Chest: No adenopathy. Normal heart size. No pleural or pericardial effusion. Lungs are clear.  Abdomen/pelvis: Spleen, bilateral adrenal glands, and pancreas are unremarkable. Cholecystectomy. No biliary dilatation. Fatty liver. No definite liver lesions identified. There is a large subcapsular fluid collection on the right kidney. This appears  increased. There is severe right-sided hydronephrosis which is increased. Unremarkable left kidney. There is a significantly thickened endometrial canal which measures 7.5 cm and increase in size of the uterus compared to the prior. There is thickening  of the bowel in the right pelvis consistent with postradiation change. Tiny free fluid in the pelvis. No diverticulitis. Mild constipation. Appendix not identified. No inflammatory change adjacent to the cecum. Right common iliac node on image 162  measures 13 x 11 mm prior measurement was 18 x 14 mm. The retroperitoneal abdominal adenopathy was seen on the prior study is a decrease in all these lymph nodes measure less than 1 cm in short axis diameter.  Small right external iliac nodes are now less  than 1 cm in short axis diameter. A left pelvic sidewall lymph nodes are also now less than 1 cm in short axis diameter. Interval enlargement of the right adnexa which measures 45 x 33 mm compared to 28 x 15 mm.         PET CT Skull Base To Thighs Initial1/04/2015  [facility]  PET CT Skull Base To [facility] Initial1/04/2015  [facility]  Result Impression  IMPRESSION: 1.  Patchy regions of increased activity within the uterine cervix and lower uterine segment compatible with primary malignancy. 2.  Hypermetabolic metastatic adenopathy within the bilateral pelvis and periaortic retroperitoneum as described above. 3.  Diffuse increased activity within the thyroid compatible with chronic thyroiditis. 4.  Smoothly marginated collection of fluid at the entire lateral aspect of the right kidney consistent with subcapsular fluid.  This measures up to 4.5 cm in thickness. Mildly distended right renal pelvis. No ureteral stone demonstrated.   Result Narrative  COMPARISON:  None ADDITIONAL CLINICAL INFO: R87.619: Unspecified abnormal cytological findings in specimens from cervix uteri  TECHNIQUE:  Whole-body PET-CT was performed utilizing intravenous F-18-fluoro-2-deoxyglucose.  Scanning from the skull-base through the proximal femora  was performed.  CT was utilized for attenuation correction and localization purposes.  Axial and  multiplanar reformatted images (including PET-CT fusion images) were reviewed.  Amount of F-18-FDG: 8.73 mCi Blood glucose level at time of injection: 91 mg/dL Injection to scan time: 63 min  FINDINGS: Patchy regions of increased activity are present within the uterine cervix and lower uterine segment with maximum SUV of 7.1. Diffuse symmetric increased activity is present at the thyroid gland. No evidence of focal thyroid lesion on CT. 1.8 x 1.4 cm  lymph node is present just medial to the bifurcation of the right  common iliac artery. Maximum SUV of 5.1. Adjacent 12 mm lymph node at this site with maximum SUV of 3.4. 1.9 x 1.1 cm lymph node at the left periaortic retroperitoneum with maximum SUV of  3.4. Additional 2.1 x 1.1 cm left iliac lymph node with maximum SUV of 4.2. No additional PET abnormality. The lungs are clear by CT.         DISCUSSION: all of history above reviewed with patient and family member. Rationale for treatment with chemotherapy + avastin discussed. Reviewed mechanism of action of chemotherapy, need for oral prehydration + IV hydration with CDDP, use of K and Mg supplementation with CDDP, premedication decadron with taxol, possible taxol aches and possible peripheral neuropathy from both taxol and CDDP. Discussed antiemetics and nutritional concerns, recommended at least 3 Ensure clear daily until po intake otherwise improves, and will ask [facility] dietician to follow up.  Discussed the low grade fevers, patient instructed to call if these are higher or symptoms of infection. Discussed concern for recurrent C diff enteritis. Discussed residual perinephric fluid on PET.  With recent sepsis and other complications including the perinephric fluid/abscess and ureteral stent,  I have recommended that we begin with the chemotherapy alone for first cycle, then add avastin with cycle 2 if she is stable.  Due to lengthy treatment and preauthorization requirements, we will be able to do first chemotherapy on 11-19-14, planning q 3 weeks. I will see her back with labs on 6-23 to be sure ok for that treatment, then with labs on 6-30 and 7-7. I expect that she will need gCSF support, but will wait at least a few days after first taxol in case significant taxol aches. We discussed neulasta vs granix; will ask for preauthorization for granix initially. Not discussed now, she may need IVF after day of treatment.     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 Systemic treatment will be in attempt to control disease, with CDDP, taxol and avastin. Plan to begin with chemtherapy only for cycle 1, to be more certain that other recent complications are resolved prior to adding avastin 2. Urosepsis related to right hydronephrosis and right perinephric fluid collection, with hospitalization 5-17 thru 10-18-14.  Post percutaneous drainage of right perinephric fluid collection with small residual by PET 11-04-14. Right ureteral stent placed by Dr Junious Silk 10-13-14. Completed antibiotics ~ 2 weeks ago, stable per ID as of  11-09-14; has had gradual improvement in low grade fevers since DC. 3.C difficile colitis during hospitalization, with flagyl course completed and no diarrhea now 4.post transfusion PRBCs 10-12-14 for Hgb 5.0.  Mildly iron deficient by labs today. 5. Intolerance to morphine and codeine causing nausea, has never tried dilaudid 6.nutrition likely not adequate yet, weight loss 47 lbs from Feb to May 2016. Increase supplements, will have nutritionist assist 7.post cholecystectomy 8.intermittent asthma, none x months 9.last mammograms 11-2013     Patient and accompanying individual have had questions answered to their satisfaction and are in agreement with plan above. Verbal consent for treatment obtained. They can contact this office for questions or concerns at any time prior to next scheduled visit. Chemotherapy orders entered. Preauthorization requested for chemo, avastin and gCSF. Time spent  90 min, including >50% discussion and coordination of care.    LIVESAY,LENNIS P, MD 11/12/2014 10:54 AM

## 2014-11-15 ENCOUNTER — Other Ambulatory Visit: Payer: Self-pay | Admitting: Oncology

## 2014-11-18 ENCOUNTER — Ambulatory Visit (HOSPITAL_BASED_OUTPATIENT_CLINIC_OR_DEPARTMENT_OTHER): Payer: BC Managed Care – PPO

## 2014-11-18 ENCOUNTER — Encounter: Payer: Self-pay | Admitting: Oncology

## 2014-11-18 ENCOUNTER — Ambulatory Visit (HOSPITAL_BASED_OUTPATIENT_CLINIC_OR_DEPARTMENT_OTHER): Payer: BC Managed Care – PPO | Admitting: Oncology

## 2014-11-18 VITALS — BP 140/74 | HR 102 | Temp 98.7°F | Resp 18 | Ht 64.0 in | Wt 147.4 lb

## 2014-11-18 DIAGNOSIS — C530 Malignant neoplasm of endocervix: Secondary | ICD-10-CM | POA: Diagnosis not present

## 2014-11-18 DIAGNOSIS — C786 Secondary malignant neoplasm of retroperitoneum and peritoneum: Secondary | ICD-10-CM

## 2014-11-18 DIAGNOSIS — D500 Iron deficiency anemia secondary to blood loss (chronic): Secondary | ICD-10-CM

## 2014-11-18 DIAGNOSIS — C7989 Secondary malignant neoplasm of other specified sites: Secondary | ICD-10-CM

## 2014-11-18 DIAGNOSIS — N2889 Other specified disorders of kidney and ureter: Secondary | ICD-10-CM

## 2014-11-18 DIAGNOSIS — C539 Malignant neoplasm of cervix uteri, unspecified: Secondary | ICD-10-CM

## 2014-11-18 DIAGNOSIS — R634 Abnormal weight loss: Secondary | ICD-10-CM

## 2014-11-18 DIAGNOSIS — N1330 Unspecified hydronephrosis: Secondary | ICD-10-CM

## 2014-11-18 DIAGNOSIS — N9489 Other specified conditions associated with female genital organs and menstrual cycle: Secondary | ICD-10-CM

## 2014-11-18 LAB — CBC WITH DIFFERENTIAL/PLATELET
BASO%: 0.3 % (ref 0.0–2.0)
BASOS ABS: 0 10*3/uL (ref 0.0–0.1)
EOS%: 4.6 % (ref 0.0–7.0)
Eosinophils Absolute: 0.3 10*3/uL (ref 0.0–0.5)
HCT: 28.9 % — ABNORMAL LOW (ref 34.8–46.6)
HGB: 9.1 g/dL — ABNORMAL LOW (ref 11.6–15.9)
LYMPH#: 0.7 10*3/uL — AB (ref 0.9–3.3)
LYMPH%: 11.7 % — ABNORMAL LOW (ref 14.0–49.7)
MCH: 29.3 pg (ref 25.1–34.0)
MCHC: 31.5 g/dL (ref 31.5–36.0)
MCV: 92.9 fL (ref 79.5–101.0)
MONO#: 0.6 10*3/uL (ref 0.1–0.9)
MONO%: 11 % (ref 0.0–14.0)
NEUT%: 72.4 % (ref 38.4–76.8)
NEUTROS ABS: 4.2 10*3/uL (ref 1.5–6.5)
PLATELETS: 431 10*3/uL — AB (ref 145–400)
RBC: 3.11 10*6/uL — AB (ref 3.70–5.45)
RDW: 19.1 % — AB (ref 11.2–14.5)
WBC: 5.8 10*3/uL (ref 3.9–10.3)

## 2014-11-18 LAB — COMPREHENSIVE METABOLIC PANEL (CC13)
ALK PHOS: 78 U/L (ref 40–150)
ALT: 13 U/L (ref 0–55)
AST: 17 U/L (ref 5–34)
Albumin: 2.6 g/dL — ABNORMAL LOW (ref 3.5–5.0)
Anion Gap: 7 mEq/L (ref 3–11)
BUN: 17.5 mg/dL (ref 7.0–26.0)
CHLORIDE: 101 meq/L (ref 98–109)
CO2: 31 meq/L — AB (ref 22–29)
CREATININE: 0.8 mg/dL (ref 0.6–1.1)
Calcium: 9.4 mg/dL (ref 8.4–10.4)
EGFR: 79 mL/min/{1.73_m2} — ABNORMAL LOW (ref >90)
GLUCOSE: 115 mg/dL (ref 70–140)
Potassium: 4.3 mEq/L (ref 3.5–5.1)
Sodium: 139 mEq/L (ref 136–145)
TOTAL PROTEIN: 7.6 g/dL (ref 6.4–8.3)
Total Bilirubin: <0.2 mg/dL (ref 0.20–1.20)

## 2014-11-18 LAB — MAGNESIUM (CC13): MAGNESIUM: 1.9 mg/dL (ref 1.5–2.5)

## 2014-11-18 MED ORDER — HEMOCYTE 324 (106 FE) MG PO TABS: 325.0000 mg | ORAL_TABLET | Freq: Every day | ORAL | Status: DC

## 2014-11-18 NOTE — Progress Notes (Signed)
OFFICE PROGRESS NOTE   November 18, 2014   Physicians:Emma Carita Pian, MD, Michel Bickers, Leonidas Romberg, Genia Del, Herma Ard  [facility] pharmacist also present at this visit.  INTERVAL HISTORY:  Patient is seen, together with sister in law, in follow up of initial consultation 11-12-14, as we plan to begin chemotherapy on 11-19-14 for recurrent/ progressive adenocarcinoma of cervix involving pelvis. Recent course has been complicated by urosepsis, right ureteral obstruction, C diff diarrhea and perinephric fluid collection. Plan is for taxol CDDP on 6-24, then addition of avastin with second treatment if otherwise stable.  Patient has felt better overall in past week, with some improvement in general strength and in appetite. She is up more thru day and is drinking 2 Boost daily in addition to increasing other po intake. She is having 2 formed stools daily. She had large volume leakage of clear fluid from vagina x1, occurred when she was lying on opposite side than usual, no pain and no bleeding. Bladder ok. Afternoon temperatures no higher than ~ 99.6 this week  No PAC  Sister in law very supportive.  ONCOLOGIC HISTORY  Patient had been in usual good health, with no previous abnormal PAP smears, when she developed frequent urination and vaginal bleeding in late 2015. She saw PCP Dr Brigitte Pulse, then had endometrial biopsy by Dr Janyth Pupa [phone]) with adenocarcinoma. PET in [facility] system 2-84-13 had hypermetabolic uptake within the uterine cervix and lower uterine segment and  metastatic adenopathy within the bilateral pelvis and periaortic retroperitoneum. Dr Genia Del and Dr Marguerita Merles treated with pelvic and para-aortic radiation given with 6 cycles of sensitizing CDDP, and HDR x5 from 07-06-14 thru 09-03-14. Patient had a difficult time with nausea, vomiting, diarrhea and hypokalemia/ hypomagnesemia thru treatment. Patient used ODT zofran and ativan  during chemotherapy. She had a difficult time doing oral prehydration for CDDP. She had no peripheral neuropathy with the CDDP. Chemotherapy information is not available at time of this consultation and will be requested; patient does not know if she received EMEND with CDDP. There was concern by completion of the treatment that she did not have optimal response, with plan for repeat evaluation at 6 weeks from completion of the treatment. Prior to planned reevaluation, patient began having daily fevers for possibly 4 weeks, as high as 102 degrees x 2 weeks prior to 10-11-14; by presentation at ED she also had increased right flank pain. She had CT at [facility] on 10-08-14, this compared with PET of 05-2014 showed increase in subcapsular fluid collection right kidney and right hydronephrosis, increase in size of endometrial canal/ uterus/ right adnexa, and improvement in retroperitoneal, common iliac and bilateral pelvic sidewall adenopathy. Symptoms worsened and she was admitted thru [facility] to ICU on 5-16/ 10-12-14 with sepsis from peptostreptococcus. She had percutaneous drainage by IR of 635 cc dark thin fluid from right perinephric area on 10-12-14, with perinephric drain removed by urology on 10-18-14. She had right ureteral stent placement by Dr Junious Silk on 10-13-14. She developed C.difficile diarrhea, begun on flagyl 10-15-14. She was discharged home on 10-18-14 on ceftin and flagyl x 2 weeks, now completed. Patient understands that she is to have the ureteral stent changed in 3 months.  She saw Dr [name] in consultation on 11-05-14, with cervix grossly normal appearance, firm to palpation with right anterior extension, and purulent material from cervical os with manipulation, no palpable adnexal masses, induration R>L parametrium. PET 07-01-38 had hypermetabolic tissue thickening at cecum, +  small right sided mesenteric and inguinal nodes, foci in myometriium and in region of cervix, small  right perinephric fluid collection and dilated fluid filled endometrium. Dr [name] saw her following the PET, with recommendation for systemic treatment of the metastatic disease with CDDP, taxol and avastin.    Review of systems as above, also: No fever. Not SOB with activity in exam room. No increased LE swelling. No chest discomfort. No other pain. No peripheral neuropathy from previous CDDP. Remainder of 10 point Review of Systems negative.  Objective:  Vital signs in last 24 hours:  BP 140/74 mmHg  Pulse 102  Temp(Src) 98.7 F (37.1 C) (Oral)  Resp 18  Ht _0  (1.626 m)  Wt 147 lb 6.4 oz (66.86 kg)  BMI 25.29 kg/m2  SpO2 100% Weight down 2 lbs.  Alert, oriented and appropriate. Ambulatory without difficulty. Looks brighter and more comfortable today.    HEENT:PERRL, sclerae not icteric. Oral mucosa moist without lesions, posterior pharynx clear.  Neck supple. No JVD.  Lymphatics:no cervical,supraclavicular or inguinal adenopathy Resp: clear to auscultation bilaterally and normal percussion bilaterally Cardio: regular rate and rhythm. No gallop. GI: soft, nontender, not distended, no mass or organomegaly. Normally active bowel sounds. Musculoskeletal/ Extremities: without pitting edema, cords, tenderness Neuro: no peripheral neuropathy. Otherwise nonfocal. Psych appropriate mood and affect. Skin without rash, ecchymosis, petechiae   Lab Results:  Results for orders placed or performed in visit on 11/18/14  CBC with Differential  Result Value Ref Range   WBC 5.8 3.9 - 10.3 10e3/uL   NEUT# 4.2 1.5 - 6.5 10e3/uL   HGB 9.1 (L) 11.6 - 15.9 g/dL   HCT 28.9 (L) 34.8 - 46.6 %   Platelets 431 (H) 145 - 400 10e3/uL   MCV 92.9 79.5 - 101.0 fL   MCH 29.3 25.1 - 34.0 pg   MCHC 31.5 31.5 - 36.0 g/dL   RBC 3.11 (L) 3.70 - 5.45 10e6/uL   RDW 19.1 (H) 11.2 - 14.5 %   lymph# 0.7 (L) 0.9 - 3.3 10e3/uL   MONO# 0.6 0.1 - 0.9 10e3/uL   Eosinophils Absolute 0.3 0.0 - 0.5 10e3/uL    Basophils Absolute 0.0 0.0 - 0.1 10e3/uL   NEUT% 72.4 38.4 - 76.8 %   LYMPH% 11.7 (L) 14.0 - 49.7 %   MONO% 11.0 0.0 - 14.0 %   EOS% 4.6 0.0 - 7.0 %   BASO% 0.3 0.0 - 2.0 %  Comprehensive metabolic panel (Cmet) - [facility]  Result Value Ref Range   Sodium 139 136 - 145 mEq/L   Potassium 4.3 3.5 - 5.1 mEq/L   Chloride 101 98 - 109 mEq/L   CO2 31 (H) 22 - 29 mEq/L   Glucose 115 70 - 140 mg/dl   BUN 17.5 7.0 - 26.0 mg/dL   Creatinine 0.8 0.6 - 1.1 mg/dL   Total Bilirubin <0.20 0.20 - 1.20 mg/dL   Alkaline Phosphatase 78 40 - 150 U/L   AST 17 5 - 34 U/L   ALT 13 0 - 55 U/L   Total Protein 7.6 6.4 - 8.3 g/dL   Albumin 2.6 (L) 3.5 - 5.0 g/dL   Calcium 9.4 8.4 - 10.4 mg/dL   Anion Gap 7 3 - 11 mEq/L   EGFR 79 (L) >90 ml/min/1.73 m2  Magnesium  Result Value Ref Range   Magnesium 1.9 1.5 - 2.5 mg/dl     Studies/Results:  No results found.  Medications: I have reviewed the patient's current medications. She has ativan  and zofran ODT available. Will add hemocyte (name brand if insurance will cover, otherwise OTC ferrous fumarate; she does not tolerate OJ so will take the iron with Vitamin C tablet.  DISCUSSION Last imaging showed fluid in endometrial canal, which is likely source of the single episode of vaginal leakage. She and family member understand plan for taxol and CDDP cycle 1 on 11-19-14. I will discuss imaging with radiology, but hopefully renal US prior to cycle 2 will be adequate to follow up the right perinephric fluid; if that is stable will add avastin to cycle 2. She may do best with additional IVF on day 2, as she had significant nausea with sensitizing CDDP/ RT after initial diagnosis. Encouraged increase in Boost to 3 daily.   RN reviewed prehydration for CDDP at visit today.  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 Systemic treatment will be in attempt to control disease, with CDDP, taxol and avastin. Plan to begin with chemtherapy only for cycle 1, to be more certain that other recent complications are resolved prior to adding avastin. I will see her 6-30 and 12-02-14.  2. Urosepsis related to right hydronephrosis and right perinephric fluid collection, with hospitalization 5-17 thru 10-18-14. Post percutaneous drainage of right perinephric fluid collection with small residual by PET 11-04-14. Right ureteral stent placed by Dr Junious Silk 10-13-14. Completed antibiotics ~ 2 weeks ago, stable per ID as of 11-09-14.  Improvement in low grade fevers since DC. 3.C difficile colitis during hospitalization, with flagyl course completed and formed normal stools now 4.post transfusion PRBCs 10-12-14 for Hgb 5.0. Mildly iron deficient by labs, add hemocyte/ ferrous fumarate beginning next week (as starting chemo on 6-24) 5. Intolerance to morphine and codeine causing nausea, has never tried dilaudid 6.nutrition likely not adequate yet, weight loss 47 lbs from Feb to May 2016. Increase supplements, will have nutritionist assist 7.post cholecystectomy 8.intermittent asthma, none x months 9.last mammograms 11-2013  All questions answered. Chemo orders confirmed. Time spent 25 min including >50% counseling and coordination of care.  Cc Drs [name], Phylis Bougie, MD   11/18/2014, 8:39 PM

## 2014-11-18 NOTE — Progress Notes (Signed)
Per [name], rx sent to pharmacy for hemocyte DAW 325 mg take one po daily on empty stomach with vitamin C tab. Instructed pharmacy per MD if insurance will not cover name brand hemocyte, give OTC ferrous fumarate no substitute, same directions. Receipt confirmed by pharmacy at 1631 on 11/18/14.

## 2014-11-19 ENCOUNTER — Telehealth: Payer: Self-pay

## 2014-11-19 ENCOUNTER — Ambulatory Visit (HOSPITAL_BASED_OUTPATIENT_CLINIC_OR_DEPARTMENT_OTHER): Payer: BC Managed Care – PPO

## 2014-11-19 VITALS — BP 120/64 | HR 87 | Temp 98.0°F | Resp 18

## 2014-11-19 DIAGNOSIS — C539 Malignant neoplasm of cervix uteri, unspecified: Secondary | ICD-10-CM

## 2014-11-19 DIAGNOSIS — Z5111 Encounter for antineoplastic chemotherapy: Secondary | ICD-10-CM | POA: Diagnosis not present

## 2014-11-19 DIAGNOSIS — C786 Secondary malignant neoplasm of retroperitoneum and peritoneum: Secondary | ICD-10-CM | POA: Diagnosis not present

## 2014-11-19 DIAGNOSIS — C530 Malignant neoplasm of endocervix: Secondary | ICD-10-CM | POA: Diagnosis not present

## 2014-11-19 LAB — CA 125: CA 125: 17 U/mL (ref <35)

## 2014-11-19 MED ORDER — DIPHENHYDRAMINE HCL 50 MG/ML IJ SOLN
INTRAMUSCULAR | Status: AC
  Filled 2014-11-19: qty 1

## 2014-11-19 MED ORDER — SODIUM CHLORIDE 0.9 % IV SOLN
40.0000 mg/m2 | Freq: Once | INTRAVENOUS | Status: AC
  Administered 2014-11-19: 70 mg via INTRAVENOUS
  Filled 2014-11-19: qty 70

## 2014-11-19 MED ORDER — LORAZEPAM 2 MG/ML IJ SOLN
0.5000 mg | Freq: Once | INTRAMUSCULAR | Status: AC
  Administered 2014-11-19: 0.5 mg via INTRAVENOUS

## 2014-11-19 MED ORDER — PACLITAXEL CHEMO INJECTION 300 MG/50ML
135.0000 mg/m2 | Freq: Once | INTRAVENOUS | Status: AC
  Administered 2014-11-19: 234 mg via INTRAVENOUS
  Filled 2014-11-19: qty 39

## 2014-11-19 MED ORDER — POTASSIUM CHLORIDE 2 MEQ/ML IV SOLN
Freq: Once | INTRAVENOUS | Status: AC
  Administered 2014-11-19: 09:00:00 via INTRAVENOUS
  Filled 2014-11-19: qty 10

## 2014-11-19 MED ORDER — LORAZEPAM 2 MG/ML IJ SOLN
INTRAMUSCULAR | Status: AC
  Filled 2014-11-19: qty 1

## 2014-11-19 MED ORDER — DIPHENHYDRAMINE HCL 50 MG/ML IJ SOLN
50.0000 mg | Freq: Once | INTRAMUSCULAR | Status: AC
  Administered 2014-11-19: 50 mg via INTRAVENOUS

## 2014-11-19 MED ORDER — FAMOTIDINE IN NACL 20-0.9 MG/50ML-% IV SOLN
INTRAVENOUS | Status: AC
  Filled 2014-11-19: qty 50

## 2014-11-19 MED ORDER — SODIUM CHLORIDE 0.9 % IV SOLN
Freq: Once | INTRAVENOUS | Status: AC
  Administered 2014-11-19: 09:00:00 via INTRAVENOUS

## 2014-11-19 MED ORDER — SODIUM CHLORIDE 0.9 % IV SOLN
Freq: Once | INTRAVENOUS | Status: AC
  Administered 2014-11-19: 13:00:00 via INTRAVENOUS
  Filled 2014-11-19: qty 8

## 2014-11-19 MED ORDER — FAMOTIDINE IN NACL 20-0.9 MG/50ML-% IV SOLN
20.0000 mg | Freq: Once | INTRAVENOUS | Status: AC
  Administered 2014-11-19: 20 mg via INTRAVENOUS

## 2014-11-19 MED ORDER — FOSAPREPITANT DIMEGLUMINE INJECTION 150 MG
Freq: Once | INTRAVENOUS | Status: AC
  Administered 2014-11-19: 12:00:00 via INTRAVENOUS
  Filled 2014-11-19: qty 5

## 2014-11-19 NOTE — Telephone Encounter (Signed)
ENCOUNTER OPENED IN ERROR

## 2014-11-19 NOTE — Telephone Encounter (Deleted)
-----   Message from Cora Collum, RN sent at 11/19/2014  9:16 AM EDT ----- Regarding: Chemo follow up call...[name] 1st time Cisplatin and Taxol

## 2014-11-19 NOTE — Patient Instructions (Addendum)
[facility] Discharge Instructions for Patients Receiving Chemotherapy  Today you received the following chemotherapy agents Taxol, Cisplatin.  To help prevent nausea and vomiting after your treatment, we encourage you to take your nausea medication: Ativan. Take one every 6 hours as needed. Take one tonight at bedtime tonight. For any nausea on 6/25 take Ativan. You will receive IV Zofran in the office. Zofran may be taken every 8 hours as needed.    If you develop nausea and vomiting that is not controlled by your nausea medication, call the clinic.   BELOW ARE SYMPTOMS THAT SHOULD BE REPORTED IMMEDIATELY:  *FEVER GREATER THAN 100.5 F  *CHILLS WITH OR WITHOUT FEVER  NAUSEA AND VOMITING THAT IS NOT CONTROLLED WITH YOUR NAUSEA MEDICATION  *UNUSUAL SHORTNESS OF BREATH  *UNUSUAL BRUISING OR BLEEDING  TENDERNESS IN MOUTH AND THROAT WITH OR WITHOUT PRESENCE OF ULCERS  *URINARY PROBLEMS  *BOWEL PROBLEMS  UNUSUAL RASH Items with * indicate a potential emergency and should be followed up as soon as possible.  Feel free to call the clinic should you have any questions or concerns. The clinic phone number is [phone].  Please show the [place] at check-in to the Emergency Department and triage nurse.

## 2014-11-19 NOTE — Progress Notes (Signed)
Worked with Pharmacy and [name] to  Obtain an order to  Secure 2 separate IV lines so that Taxol can infuse in one site and Cisplatin will infuse into another site. Second IV site obtained to infuse Cisplatin.

## 2014-11-20 ENCOUNTER — Encounter: Payer: Self-pay | Admitting: Oncology

## 2014-11-20 ENCOUNTER — Ambulatory Visit (HOSPITAL_BASED_OUTPATIENT_CLINIC_OR_DEPARTMENT_OTHER): Payer: BC Managed Care – PPO

## 2014-11-20 DIAGNOSIS — C7989 Secondary malignant neoplasm of other specified sites: Secondary | ICD-10-CM | POA: Diagnosis not present

## 2014-11-20 DIAGNOSIS — C539 Malignant neoplasm of cervix uteri, unspecified: Secondary | ICD-10-CM | POA: Diagnosis not present

## 2014-11-20 MED ORDER — SODIUM CHLORIDE 0.9 % IV SOLN
Freq: Once | INTRAVENOUS | Status: AC | PRN
  Administered 2014-11-20: 09:00:00 via INTRAVENOUS

## 2014-11-20 MED ORDER — SODIUM CHLORIDE 0.9 % IV SOLN
INTRAVENOUS | Status: DC
  Administered 2014-11-20: 09:00:00 via INTRAVENOUS

## 2014-11-20 NOTE — Patient Instructions (Signed)

## 2014-11-20 NOTE — Progress Notes (Signed)
Medical Oncology  Message to pharmacy/ infusion nursing/ infusion scheduling:       First treatment with CDDP and taxol went later than optimal in infusion, started 0900.    Cycle 2 will have avastin in addition to CDDP and taxol.   Will we need to give this over 2 days?   If over 2 days is needed and the CDDP and taxol orders are together in same careplan, could she start 0730 with CDDP taxol on day 1, then do avastin on day 2?   Expect cycle 2 will be 12-10-14, then every 3 weeks      L.Marko Plume, MD

## 2014-11-22 ENCOUNTER — Telehealth: Payer: Self-pay | Admitting: Oncology

## 2014-11-22 ENCOUNTER — Telehealth: Payer: Self-pay

## 2014-11-22 ENCOUNTER — Other Ambulatory Visit: Payer: Self-pay | Admitting: Oncology

## 2014-11-22 NOTE — Telephone Encounter (Signed)
-----   Message from Cora Collum, RN sent at 11/19/2014  9:16 AM EDT ----- Regarding: Chemo follow up call...[name] 1st time Cisplatin and Taxol

## 2014-11-22 NOTE — Telephone Encounter (Signed)
Ms. Hawn stated that she is doing well. No n/v.  The fluids and antiemetics Sat. 11-20-14 must have helped.  Fatigue is the only issue that she has right now. She was a little constipated but took a colace which resolved the constipation.  Suggested that she take 1 daily regularly as the iron will add to constipation  once she begins it at the end of this week.  Ms. Blakeley agreed to taking colace daily. She know to call [phone] if she has any issues or concerns prior to visit 11-25-14.

## 2014-11-22 NOTE — Telephone Encounter (Signed)
Medical Oncology  Spoke with Dr [facility] radiology re type of imaging prior to start of avastin. Renal US would not be sufficient, should be noncontrast CT to evaluate the perinephric fluid collection.  Godfrey Pick, MD

## 2014-11-23 ENCOUNTER — Telehealth: Payer: Self-pay

## 2014-11-23 NOTE — Telephone Encounter (Signed)
-----   Message from Gordy Levan, MD sent at 11/12/2014 11:24 PM EDT ----- [facility] -  could you please get outside chemotherapy flow sheets from Dr Genia Del, patient treated in her [facility] office Feb - April 2016. Copy to my desk and please also scan into EMR Thank you [place]

## 2014-11-23 NOTE — Telephone Encounter (Signed)
Juliann Pulse with Dr. Shelba Flake office faxed the chemotherapy flow sheets to [name] as noted below by [name]. A copy was sent to HIM to be scanned into patient's EMR and a copy placed on Dr. Mariana Kaufman desk for review.

## 2014-11-25 ENCOUNTER — Encounter: Payer: Self-pay | Admitting: Oncology

## 2014-11-25 ENCOUNTER — Other Ambulatory Visit (HOSPITAL_BASED_OUTPATIENT_CLINIC_OR_DEPARTMENT_OTHER): Payer: BC Managed Care – PPO

## 2014-11-25 ENCOUNTER — Other Ambulatory Visit: Payer: Self-pay | Admitting: Oncology

## 2014-11-25 ENCOUNTER — Ambulatory Visit (HOSPITAL_BASED_OUTPATIENT_CLINIC_OR_DEPARTMENT_OTHER): Payer: BC Managed Care – PPO | Admitting: Oncology

## 2014-11-25 VITALS — BP 109/63 | HR 123 | Temp 98.4°F | Resp 20 | Ht 64.0 in | Wt 149.2 lb

## 2014-11-25 DIAGNOSIS — C7989 Secondary malignant neoplasm of other specified sites: Secondary | ICD-10-CM

## 2014-11-25 DIAGNOSIS — G620 Drug-induced polyneuropathy: Secondary | ICD-10-CM

## 2014-11-25 DIAGNOSIS — C786 Secondary malignant neoplasm of retroperitoneum and peritoneum: Secondary | ICD-10-CM

## 2014-11-25 DIAGNOSIS — C530 Malignant neoplasm of endocervix: Secondary | ICD-10-CM

## 2014-11-25 DIAGNOSIS — D509 Iron deficiency anemia, unspecified: Secondary | ICD-10-CM | POA: Diagnosis not present

## 2014-11-25 DIAGNOSIS — N2889 Other specified disorders of kidney and ureter: Secondary | ICD-10-CM | POA: Diagnosis not present

## 2014-11-25 DIAGNOSIS — R634 Abnormal weight loss: Secondary | ICD-10-CM

## 2014-11-25 DIAGNOSIS — D500 Iron deficiency anemia secondary to blood loss (chronic): Secondary | ICD-10-CM

## 2014-11-25 DIAGNOSIS — N1330 Unspecified hydronephrosis: Secondary | ICD-10-CM

## 2014-11-25 DIAGNOSIS — C539 Malignant neoplasm of cervix uteri, unspecified: Secondary | ICD-10-CM

## 2014-11-25 LAB — CBC WITH DIFFERENTIAL/PLATELET
BASO%: 0.6 % (ref 0.0–2.0)
BASOS ABS: 0 10*3/uL (ref 0.0–0.1)
EOS%: 4 % (ref 0.0–7.0)
Eosinophils Absolute: 0.2 10*3/uL (ref 0.0–0.5)
HCT: 30.3 % — ABNORMAL LOW (ref 34.8–46.6)
HGB: 10.1 g/dL — ABNORMAL LOW (ref 11.6–15.9)
LYMPH%: 9.9 % — ABNORMAL LOW (ref 14.0–49.7)
MCH: 29.3 pg (ref 25.1–34.0)
MCHC: 33.3 g/dL (ref 31.5–36.0)
MCV: 88 fL (ref 79.5–101.0)
MONO#: 0.2 10*3/uL (ref 0.1–0.9)
MONO%: 3.5 % (ref 0.0–14.0)
NEUT#: 4.2 10*3/uL (ref 1.5–6.5)
NEUT%: 82 % — ABNORMAL HIGH (ref 38.4–76.8)
Platelets: 481 10*3/uL — ABNORMAL HIGH (ref 145–400)
RBC: 3.45 10*6/uL — AB (ref 3.70–5.45)
RDW: 19.9 % — ABNORMAL HIGH (ref 11.2–14.5)
WBC: 5.1 10*3/uL (ref 3.9–10.3)
lymph#: 0.5 10*3/uL — ABNORMAL LOW (ref 0.9–3.3)

## 2014-11-25 LAB — COMPREHENSIVE METABOLIC PANEL (CC13)
ALT: 10 U/L (ref 0–55)
AST: 11 U/L (ref 5–34)
Albumin: 3 g/dL — ABNORMAL LOW (ref 3.5–5.0)
Alkaline Phosphatase: 72 U/L (ref 40–150)
Anion Gap: 11 mEq/L (ref 3–11)
BILIRUBIN TOTAL: 0.3 mg/dL (ref 0.20–1.20)
BUN: 23.5 mg/dL (ref 7.0–26.0)
CO2: 28 meq/L (ref 22–29)
Calcium: 9.6 mg/dL (ref 8.4–10.4)
Chloride: 97 mEq/L — ABNORMAL LOW (ref 98–109)
Creatinine: 0.9 mg/dL (ref 0.6–1.1)
EGFR: 75 mL/min/{1.73_m2} — ABNORMAL LOW (ref >90)
Glucose: 135 mg/dl (ref 70–140)
POTASSIUM: 3.9 meq/L (ref 3.5–5.1)
Sodium: 136 mEq/L (ref 136–145)
Total Protein: 7.4 g/dL (ref 6.4–8.3)

## 2014-11-25 LAB — MAGNESIUM (CC13): Magnesium: 1.9 mg/dl (ref 1.5–2.5)

## 2014-11-25 MED ORDER — DEXAMETHASONE 4 MG PO TABS: ORAL_TABLET | ORAL | Status: DC

## 2014-11-25 MED ORDER — PANTOPRAZOLE SODIUM 40 MG PO TBEC: 40.0000 mg | DELAYED_RELEASE_TABLET | Freq: Every day | ORAL | Status: DC

## 2014-11-25 NOTE — Progress Notes (Signed)
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 Plan is to add avastin to this regimen with cycle 2 as long as she remains stable, including no significant recurrence of right perinephric fluid collection.  She had IVF on day 2; she has not had gCSF.  Recent course prior to start of chemo was complicated by urosepsis and C diff diarrhea in 09-2014. She had percutaneous drainage of right perinephric fluid collection and right ureteral stent that admission.  The initial chemotherapy with CDDP and taxol on 11-19-14 was was a very long procedure, eventually with a second IV started to complete post CDDP hydration while taxol was in process. IVF on day 2 was helpful, particularly as she does not do well drinking fluids. She has had no nausea. "Napped first 3 days". She has had only ~ 11 oz po fluids today and feels "dizzy" at times when up. She is eating, and is drinking ~ 1 Boost daily. Bowels moved 2 days ago with formed stool and hemorrhoids are uncomfortable; she has been using only qod stool softener. GERD symptoms are bothersome, using OTC for this (?calcium, does not seem to be zantac or pepcid). No taxol aches. Slight neuropathy tips of fingers and toes. No bleeding. Bladder and stent seem ok. No afternoon temperature elevations in past week.   No PAC  They have family beach trip Aug 6 x 1 week.  ONCOLOGIC HISTORY Patient had been in usual good health, with no previous abnormal PAP smears, when she developed frequent urination and vaginal bleeding in late 2015. She saw PCP Dr Brigitte Pulse, then had endometrial biopsy by Dr Janyth Pupa [phone]) with adenocarcinoma. PET in [place] system 06-08-14  had hypermetabolic uptake within the uterine cervix and lower uterine segment and metastatic adenopathy within the bilateral pelvis and periaortic retroperitoneum. Dr Genia Del and Dr Marguerita Merles treated with pelvic and para-aortic radiation given with 6 cycles of sensitizing CDDP, and HDR x5 from 07-06-14 thru 09-03-14. Patient had a difficult time with nausea, vomiting, diarrhea and hypokalemia/ hypomagnesemia thru treatment. Patient used ODT zofran and ativan during chemotherapy. She had a difficult time doing oral prehydration for CDDP. She had no peripheral neuropathy with the CDDP. Chemotherapy information is not available at time of this consultation and will be requested; patient does not know if she received EMEND with CDDP. There was concern by completion of the treatment that she did not have optimal response, with plan for repeat evaluation at 6 weeks from completion of the treatment. Prior to planned reevaluation, patient began having daily fevers for possibly 4 weeks, as high as 102 degrees x 2 weeks prior to 10-11-14; by presentation at ED she also had increased right flank pain. She had CT at [facility] on 10-08-14, this compared with PET of 05-2014 showed increase in subcapsular fluid collection right kidney and right hydronephrosis, increase in size of endometrial canal/ uterus/ right adnexa, and improvement in retroperitoneal, common iliac and bilateral pelvic sidewall adenopathy. Symptoms worsened and she was admitted thru [facility] ED to ICU on 5-16/ 10-12-14 with sepsis from peptostreptococcus. She had percutaneous drainage by IR of 635 cc dark thin fluid from right perinephric area on 10-12-14, with perinephric drain removed by urology on 10-18-14.  She had right ureteral stent placement by Dr Junious Silk on 10-13-14. She developed C.difficile diarrhea, begun on flagyl 10-15-14. She was discharged home on 10-18-14 on ceftin and flagyl x 2 weeks, now completed. Patient  understands that she is to have the ureteral stent changed in 3 months.  She saw Dr [name] in consultation on 11-05-14, with cervix grossly normal appearance, firm to palpation with right anterior extension, and purulent material from cervical os with manipulation, no palpable adnexal masses, induration R>L parametrium. PET 4-0-98 had hypermetabolic tissue thickening at cecum, + small right sided mesenteric and inguinal nodes, foci in myometriium and in region of cervix, small right perinephric fluid collection and dilated fluid filled endometrium. Dr [name] saw her following the PET, with recommendation for systemic treatment of the metastatic disease with CDDP, taxol and avastin.      Review of systems as above, also: No LE swelling. No hematuria. No fever or localizing symptoms of infection. Not SOB. No flank discomfort.  Remainder of 10 point Review of Systems negative.  Objective:  Vital signs in last 24 hours:  BP 109/63 mmHg  Pulse 123  Temp(Src) 98.4 F (36.9 C) (Oral)  Resp 20  Ht 5' 4"  (1.626 m)  Wt 149 lb 3.2 oz (67.677 kg)  BMI 25.60 kg/m2  SpO2 100% Weight up 2 lbs  Alert, oriented and appropriate. Ambulatory without difficulty, looks generally comfortable. No alopecia  HEENT:PERRL, sclerae not icteric. Oral mucosa moist without lesions, posterior pharynx clear.  Neck supple. No JVD.  Lymphatics:no cervical,supraclavicular or inguinal adenopathy Resp: clear to auscultation bilaterally and normal percussion bilaterally Cardio: tachy, clear heart sounds, regular rate and rhythm. No gallop. GI: soft, nontender, not distended, no mass or organomegaly. A few bowel sounds.  Musculoskeletal/ Extremities: without pitting edema, cords, tenderness Neuro: no significant peripheral neuropathy. Otherwise nonfocal. PSYCH appropriate mood and affect. Skin without rash, ecchymosis, petechiae    Lab Results:  Results for orders placed or performed in visit on 11/25/14  CBC with  Differential  Result Value Ref Range   WBC 5.1 3.9 - 10.3 10e3/uL   NEUT# 4.2 1.5 - 6.5 10e3/uL   HGB 10.1 (L) 11.6 - 15.9 g/dL   HCT 30.3 (L) 34.8 - 46.6 %   Platelets 481 (H) 145 - 400 10e3/uL   MCV 88.0 79.5 - 101.0 fL   MCH 29.3 25.1 - 34.0 pg   MCHC 33.3 31.5 - 36.0 g/dL   RBC 3.45 (L) 3.70 - 5.45 10e6/uL   RDW 19.9 (H) 11.2 - 14.5 %   lymph# 0.5 (L) 0.9 - 3.3 10e3/uL   MONO# 0.2 0.1 - 0.9 10e3/uL   Eosinophils Absolute 0.2 0.0 - 0.5 10e3/uL   Basophils Absolute 0.0 0.0 - 0.1 10e3/uL   NEUT% 82.0 (H) 38.4 - 76.8 %   LYMPH% 9.9 (L) 14.0 - 49.7 %   MONO% 3.5 0.0 - 14.0 %   EOS% 4.0 0.0 - 7.0 %   BASO% 0.6 0.0 - 2.0 %  Magnesium  Result Value Ref Range   Magnesium 1.9 1.5 - 2.5 mg/dl  Comprehensive metabolic panel  Result Value Ref Range   Sodium 136 136 - 145 mEq/L   Potassium 3.9 3.5 - 5.1 mEq/L   Chloride 97 (L) 98 - 109 mEq/L   CO2 28 22 - 29 mEq/L   Glucose 135 70 - 140 mg/dl   BUN 23.5 7.0 - 26.0 mg/dL   Creatinine 0.9 0.6 - 1.1 mg/dL   Total Bilirubin 0.30 0.20 - 1.20  mg/dL   Alkaline Phosphatase 72 40 - 150 U/L   AST 11 5 - 34 U/L   ALT 10 0 - 55 U/L   Total Protein 7.4 6.4 - 8.3 g/dL   Albumin 3.0 (L) 3.5 - 5.0 g/dL   Calcium 9.6 8.4 - 10.4 mg/dL   Anion Gap 11 3 - 11 mEq/L   EGFR 75 (L) >90 ml/min/1.73 m2   CBC reviewed, counts not at nadir but ok today.   Studies/Results:  No results found.  Medications: I have reviewed the patient's current medications. She will start Senokot S 1-2 daily beginning today and can increase this to bid if needed. Add protonix 40 mg daily. She will not begin oral iron until constipation is improved.   DISCUSSION We have discussed situation with first chemo administration, and I have assured them that nurses were speaking with me by phone with updates during the process.  Per pharmacist,   If we change her hydration fluid to D5/0.9 NaCl, we can run the prehydration with Taxol. Then we can run the post-hydration with  Cisplatin. This should help and save about 3 hours.   I will clarify Mg/ K in IVF with taxol. I have told patient and sister in law that I believe it will be best to give avastin with extra hydration on day 2.  Repeat imaging for right perinephric fluid collection will be best done with noncontrast limited CT per radiologist.   Discussed importance of adequate hydration. Patient drank bottle of water in exam room and prefers to do this po rather than additional IVF now. I have instructed her to drink 1/2 bottle fluids immediately any time that she notices lightheadedness.   Discussed medications as above. Sitz baths for hemorrhoids.  Discussed peripheral neuropathy related to both CDDP and taxol. Encouraged her to exercise hands and feet as much as possible.  Assessment/Plan: 1.recurrent/ progressive adenocarcinoma of cervix: clinical IIB at diagnosis 04-2014 treated initially with radiation and sensitizing cisplatinum, now with metastatic disease to pelvis and retroperitoneum, with right hydronephrosis. Systemic treatment will be in attempt to control disease, with CDDP, taxol and avastin. First CDDP Taxol 11-19-14, overall has tolerated well thus far, logistical and other plans as above. Will image right perinephric area prior to adding avastin on day 2 cycle 2.  2. Urosepsis related to right hydronephrosis and right perinephric fluid collection, with hospitalization 5-17 thru 10-18-14. Post percutaneous drainage of right perinephric fluid collection with small residual by PET 11-04-14. Right ureteral stent placed by Dr Junious Silk 10-13-14. Completed antibiotics and stable per ID to begin chemo. Resolution of low grade fevers. 3.C difficile colitis during hospitalization, with flagyl course completed, no diarrhea. 4.post transfusion PRBCs 10-12-14 for Hgb 5.0. Mildly iron deficient by labs, begin hemocyte/ ferrous fumarate when constipation improved. 5. Intolerance to morphine and codeine causing  nausea, has never tried dilaudid 6.nutrition likely not adequate yet, weight loss 47 lbs from Feb to May 2016. Increase supplements, needs nutritionist to assist 7.constipation, hemorrhoidal discomfort: as above 8.intermittent asthma, none x months 9. Mild orthostatic symptoms: push po fluids. 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 Patient knows to call if concerns prior to next scheduled viist on 12-02-14; I will ask RN to check on her by phone next week prior to that visit. She will need noncontrast CT of right renal area prior to cycle 2 on 7-14 (taxol CDDP) and 12-10-14 (  avastin IVF)  Communication with [facility] pharmacist, nurse manager and scheduling re treatment logistics. Chemo, avastin and IVF orders in place, will need to be confirmed prior to treatment. Time spent 40+ min including >50% counseling and coordination of care.    LIVESAY,LENNIS P, MD   11/25/2014, 2:47 PM

## 2014-11-26 ENCOUNTER — Telehealth: Payer: Self-pay | Admitting: Oncology

## 2014-11-26 ENCOUNTER — Telehealth: Payer: Self-pay | Admitting: *Deleted

## 2014-11-26 NOTE — Telephone Encounter (Signed)
Called patient and she is aware of her upcoming appointments

## 2014-11-26 NOTE — Telephone Encounter (Signed)
Per staff message and POF I have scheduled appts. Advised scheduler of appts. JMW  

## 2014-11-27 ENCOUNTER — Other Ambulatory Visit: Payer: Self-pay | Admitting: Oncology

## 2014-11-27 ENCOUNTER — Encounter: Payer: Self-pay | Admitting: Oncology

## 2014-11-27 DIAGNOSIS — C7989 Secondary malignant neoplasm of other specified sites: Secondary | ICD-10-CM

## 2014-11-27 DIAGNOSIS — N2889 Other specified disorders of kidney and ureter: Secondary | ICD-10-CM

## 2014-11-27 MED ORDER — SODIUM CHLORIDE 0.9 % IV SOLN: Freq: Once | INTRAVENOUS | Status: DC

## 2014-11-27 MED ORDER — SODIUM CHLORIDE 0.9 % IV SOLN: INTRAVENOUS | Status: DC

## 2014-11-27 NOTE — Progress Notes (Signed)
Medical Oncology  Chemotherapy flowsheets received from [facility]), which will be scanned into this EMR.  Patient received CDDP total dose 70 mg on 07-09-14, 07-16-14, 07-23-14, 07-30-14, 08-06-14, and 08-13-14.  Premeds were EMEND 150 mg, zofran 12 mg, decadron 12 mg and single dose ativan 0.5 mg with one treatment.   Godfrey Pick, MD

## 2014-11-29 ENCOUNTER — Other Ambulatory Visit: Payer: Self-pay | Admitting: Oncology

## 2014-11-29 DIAGNOSIS — C539 Malignant neoplasm of cervix uteri, unspecified: Secondary | ICD-10-CM

## 2014-11-29 DIAGNOSIS — C7989 Secondary malignant neoplasm of other specified sites: Secondary | ICD-10-CM

## 2014-11-30 ENCOUNTER — Telehealth: Payer: Self-pay | Admitting: Oncology

## 2014-11-30 NOTE — Telephone Encounter (Signed)
Lab added to 7/13 as not enough time on 7/14 due to chemo appointment,patint will get a new schedule 12/03/14

## 2014-12-01 ENCOUNTER — Telehealth: Payer: Self-pay

## 2014-12-01 NOTE — Telephone Encounter (Signed)
Patient Demographics     Patient Name Sex DOB SSN Address Phone               Message  Received: 4 days ago    Gordy Levan, MD  Baruch Merl, RN; Christa See, RN           RN please check on her by phone on 7-5. If concerns would get CBC that day, might need chemistries and orthostatic vitals if so.  Not taking po fluids well and constipated at visit 6-30.   FIrst CDDP Taxol 6-24. No gCSF. To see LL with labs 7-7.   thanks

## 2014-12-01 NOTE — Telephone Encounter (Addendum)
-----   Message from Gordy Levan, MD sent at 11/27/2014  9:08 PM EDT ----- #2 note on this one  Will have noncontrast CT ~ 7-11 to follow up right perinephric fluid collection prior to adding avastin to cycle 2 chemo.  I need to be sure to see results and we need to let patient know.   Thank you  Spoke with Benedetto Goad in managed Care at the [facility]  She will work on pre-cert for [place] per [employer] 12-01-14.

## 2014-12-01 NOTE — Telephone Encounter (Signed)
Left message requesting a call back to see how she was taking in fluids and if constipation  Resolved. Ct not scheduled yet as requested by [name] in notes below.

## 2014-12-02 ENCOUNTER — Ambulatory Visit (HOSPITAL_BASED_OUTPATIENT_CLINIC_OR_DEPARTMENT_OTHER): Payer: BC Managed Care – PPO | Admitting: Oncology

## 2014-12-02 ENCOUNTER — Telehealth: Payer: Self-pay | Admitting: Oncology

## 2014-12-02 ENCOUNTER — Other Ambulatory Visit (HOSPITAL_BASED_OUTPATIENT_CLINIC_OR_DEPARTMENT_OTHER): Payer: BC Managed Care – PPO

## 2014-12-02 ENCOUNTER — Encounter: Payer: Self-pay | Admitting: Oncology

## 2014-12-02 VITALS — BP 107/79 | HR 85 | Temp 98.2°F | Resp 18 | Ht 64.0 in | Wt 153.6 lb

## 2014-12-02 DIAGNOSIS — D701 Agranulocytosis secondary to cancer chemotherapy: Secondary | ICD-10-CM

## 2014-12-02 DIAGNOSIS — N2889 Other specified disorders of kidney and ureter: Secondary | ICD-10-CM | POA: Diagnosis not present

## 2014-12-02 DIAGNOSIS — D500 Iron deficiency anemia secondary to blood loss (chronic): Secondary | ICD-10-CM | POA: Diagnosis not present

## 2014-12-02 DIAGNOSIS — C7989 Secondary malignant neoplasm of other specified sites: Secondary | ICD-10-CM | POA: Diagnosis not present

## 2014-12-02 DIAGNOSIS — G620 Drug-induced polyneuropathy: Secondary | ICD-10-CM

## 2014-12-02 DIAGNOSIS — G622 Polyneuropathy due to other toxic agents: Secondary | ICD-10-CM

## 2014-12-02 DIAGNOSIS — C530 Malignant neoplasm of endocervix: Secondary | ICD-10-CM | POA: Diagnosis not present

## 2014-12-02 DIAGNOSIS — C539 Malignant neoplasm of cervix uteri, unspecified: Secondary | ICD-10-CM

## 2014-12-02 DIAGNOSIS — T451X5A Adverse effect of antineoplastic and immunosuppressive drugs, initial encounter: Secondary | ICD-10-CM

## 2014-12-02 DIAGNOSIS — R634 Abnormal weight loss: Secondary | ICD-10-CM

## 2014-12-02 LAB — COMPREHENSIVE METABOLIC PANEL (CC13)
ALK PHOS: 73 U/L (ref 40–150)
ALT: 15 U/L (ref 0–55)
AST: 15 U/L (ref 5–34)
Albumin: 2.9 g/dL — ABNORMAL LOW (ref 3.5–5.0)
Anion Gap: 11 mEq/L (ref 3–11)
BUN: 15.7 mg/dL (ref 7.0–26.0)
CO2: 29 mEq/L (ref 22–29)
Calcium: 9.4 mg/dL (ref 8.4–10.4)
Chloride: 97 mEq/L — ABNORMAL LOW (ref 98–109)
Creatinine: 0.9 mg/dL (ref 0.6–1.1)
EGFR: 74 mL/min/{1.73_m2} — ABNORMAL LOW (ref >90)
Glucose: 112 mg/dl (ref 70–140)
Potassium: 3.3 mEq/L — ABNORMAL LOW (ref 3.5–5.1)
SODIUM: 137 meq/L (ref 136–145)
TOTAL PROTEIN: 7 g/dL (ref 6.4–8.3)
Total Bilirubin: 0.26 mg/dL (ref 0.20–1.20)

## 2014-12-02 LAB — CBC WITH DIFFERENTIAL/PLATELET
BASO%: 0.9 % (ref 0.0–2.0)
Basophils Absolute: 0 10*3/uL (ref 0.0–0.1)
EOS%: 10.4 % — ABNORMAL HIGH (ref 0.0–7.0)
Eosinophils Absolute: 0.2 10*3/uL (ref 0.0–0.5)
HCT: 28.4 % — ABNORMAL LOW (ref 34.8–46.6)
HGB: 9.1 g/dL — ABNORMAL LOW (ref 11.6–15.9)
LYMPH%: 25.9 % (ref 14.0–49.7)
MCH: 29.8 pg (ref 25.1–34.0)
MCHC: 32 g/dL (ref 31.5–36.0)
MCV: 93.1 fL (ref 79.5–101.0)
MONO#: 0.7 10*3/uL (ref 0.1–0.9)
MONO%: 32.1 % — ABNORMAL HIGH (ref 0.0–14.0)
NEUT#: 0.7 10*3/uL — ABNORMAL LOW (ref 1.5–6.5)
NEUT%: 30.7 % — ABNORMAL LOW (ref 38.4–76.8)
PLATELETS: 316 10*3/uL (ref 145–400)
RBC: 3.05 10*6/uL — ABNORMAL LOW (ref 3.70–5.45)
RDW: 19.2 % — ABNORMAL HIGH (ref 11.2–14.5)
WBC: 2.1 10*3/uL — ABNORMAL LOW (ref 3.9–10.3)
lymph#: 0.6 10*3/uL — ABNORMAL LOW (ref 0.9–3.3)

## 2014-12-02 LAB — MAGNESIUM (CC13): MAGNESIUM: 2 mg/dL (ref 1.5–2.5)

## 2014-12-02 MED ORDER — FILGRASTIM 300 MCG/0.5ML IJ SOSY: 300.0000 ug | PREFILLED_SYRINGE | Freq: Once | INTRAMUSCULAR | Status: DC

## 2014-12-02 MED ORDER — TBO-FILGRASTIM 300 MCG/0.5ML ~~LOC~~ SOSY
300.0000 ug | PREFILLED_SYRINGE | Freq: Once | SUBCUTANEOUS | Status: AC
  Administered 2014-12-02: 300 ug via SUBCUTANEOUS
  Filled 2014-12-02: qty 0.5

## 2014-12-02 NOTE — Telephone Encounter (Signed)
Appointments made and avs printed for patient °

## 2014-12-02 NOTE — Progress Notes (Signed)
OFFICE PROGRESS NOTE   December 02, 2014   Physicians:Emma Carita Pian, MD, Michel Bickers, Leonidas Romberg, Genia Del, Herma Ard  INTERVAL HISTORY:  Patient is seen, together with sister in law, in continuing attention to treatment in progress for recurrent adenocarcinoma of cervix involving pelvis. She had first CDDP taxol on 11-19-14 and is neutropenic today (day 14 cycle 1), but overall feeling better even so. Recent course prior to start of chemo was complicated by urosepsis, right perinephric fluid collection and C difficile diarrhea.  Patient has felt less fatigued, has been drinking fluids well (water with lemonade packets) and appetite is improved including Boost supplements. She has had no fever or UTI symptoms and no flank pain. Bowels are moving regularly without diarrhea. She enjoyed family activities over holiday weekend.   No PAC  They have family beach trip Aug 6 x 1 week.  ONCOLOGIC HISTORY Patient had been in usual good health, with no previous abnormal PAP smears, when she developed frequent urination and vaginal bleeding in late 2015. She saw PCP Dr Brigitte Pulse, then had endometrial biopsy by Dr Janyth Pupa [phone]) with adenocarcinoma. PET in [place] system 4-97-02 had hypermetabolic uptake within the uterine cervix and lower uterine segment and metastatic adenopathy within the bilateral pelvis and periaortic retroperitoneum. Dr Genia Del and Dr Marguerita Merles treated with pelvic and para-aortic radiation given with 6 cycles of sensitizing CDDP, and HDR x5 from 07-06-14 thru 09-03-14. Patient had a difficult time with nausea, vomiting, diarrhea and hypokalemia/ hypomagnesemia thru treatment. Patient used ODT zofran and ativan during chemotherapy. She had a difficult time doing oral prehydration for CDDP. She had no peripheral neuropathy with the CDDP. Chemotherapy information is not available at time of this consultation and will be requested;  patient does not know if she received EMEND with CDDP. There was concern by completion of the treatment that she did not have optimal response, with plan for repeat evaluation at 6 weeks from completion of the treatment. Prior to planned reevaluation, patient began having daily fevers for possibly 4 weeks, as high as 102 degrees x 2 weeks prior to 10-11-14; by presentation at ED she also had increased right flank pain. She had CT at [facility] on 10-08-14, this compared with PET of 05-2014 showed increase in subcapsular fluid collection right kidney and right hydronephrosis, increase in size of endometrial canal/ uterus/ right adnexa, and improvement in retroperitoneal, common iliac and bilateral pelvic sidewall adenopathy. Symptoms worsened and she was admitted thru [facility] ED to ICU on 5-16/ 10-12-14 with sepsis from peptostreptococcus. She had percutaneous drainage by IR of 635 cc dark thin fluid from right perinephric area on 10-12-14, with perinephric drain removed by urology on 10-18-14. She had right ureteral stent placement by Dr Junious Silk on 10-13-14. She developed C.difficile diarrhea, begun on flagyl 10-15-14. She was discharged home on 10-18-14 on ceftin and flagyl x 2 weeks, now completed. Patient understands that she is to have the ureteral stent changed in 3 months.  She saw Dr [name] in consultation on 11-05-14, with cervix grossly normal appearance, firm to palpation with right anterior extension, and purulent material from cervical os with manipulation, no palpable adnexal masses, induration R>L parametrium. PET 10-28-76 had hypermetabolic tissue thickening at cecum, + small right sided mesenteric and inguinal nodes, foci in myometriium and in region of cervix, small right perinephric fluid collection and dilated fluid filled endometrium. Dr [name] saw her following the PET, with recommendation for systemic treatment of the metastatic  disease with CDDP, taxol and avastin.    Review  of systems as above, also: Sunburnt at pool over weekend. No bleeding including hematuria. No SOB or cough. No hair loss yet. Minimal peripheral neuropathy unchanged. Remainder of 10 point Review of Systems negative.  Objective:  Vital signs in last 24 hours:  BP 107/79 mmHg  Pulse 85  Temp(Src) 98.2 F (36.8 C) (Oral)  Resp 18  Ht 5' 4" (1.626 m)  Wt 153 lb 9.6 oz (69.673 kg)  BMI 26.35 kg/m2  SpO2 100% Weight up 4 lbs Alert, oriented and appropriate. Ambulatory without difficulty.   HEENT:PERRL, sclerae not icteric. Oral mucosa moist without lesions, posterior pharynx clear.  Neck supple. No JVD.  Lymphatics:no cervical,supraclavicular or inguinal adenopathy Resp: clear to auscultation bilaterally and normal percussion bilaterally Cardio: regular rate and rhythm. No gallop. GI: soft, nontender, not distended, no mass or organomegaly. Normally active bowel sounds. Musculoskeletal/ Extremities: without pitting edema, cords, tenderness. Flanks not tender to palpation. Neuro: no peripheral neuropathy. Otherwise nonfocal Skin without rash, ecchymosis, petechiae. Sunburn without blistering or peeling extremities, chest   Lab Results:  Results for orders placed or performed in visit on 12/02/14  CBC with Differential  Result Value Ref Range   WBC 2.1 (L) 3.9 - 10.3 10e3/uL   NEUT# 0.7 (L) 1.5 - 6.5 10e3/uL   HGB 9.1 (L) 11.6 - 15.9 g/dL   HCT 28.4 (L) 34.8 - 46.6 %   Platelets 316 145 - 400 10e3/uL   MCV 93.1 79.5 - 101.0 fL   MCH 29.8 25.1 - 34.0 pg   MCHC 32.0 31.5 - 36.0 g/dL   RBC 3.05 (L) 3.70 - 5.45 10e6/uL   RDW 19.2 (H) 11.2 - 14.5 %   lymph# 0.6 (L) 0.9 - 3.3 10e3/uL   MONO# 0.7 0.1 - 0.9 10e3/uL   Eosinophils Absolute 0.2 0.0 - 0.5 10e3/uL   Basophils Absolute 0.0 0.0 - 0.1 10e3/uL   NEUT% 30.7 (L) 38.4 - 76.8 %   LYMPH% 25.9 14.0 - 49.7 %   MONO% 32.1 (H) 0.0 - 14.0 %   EOS% 10.4 (H) 0.0 - 7.0 %   BASO% 0.9 0.0 - 2.0 %  Magnesium  Result Value Ref Range    Magnesium 2.0 1.5 - 2.5 mg/dl  Comprehensive metabolic panel  Result Value Ref Range   Sodium 137 136 - 145 mEq/L   Potassium 3.3 (L) 3.5 - 5.1 mEq/L   Chloride 97 (L) 98 - 109 mEq/L   CO2 29 22 - 29 mEq/L   Glucose 112 70 - 140 mg/dl   BUN 15.7 7.0 - 26.0 mg/dL   Creatinine 0.9 0.6 - 1.1 mg/dL   Total Bilirubin 0.26 0.20 - 1.20 mg/dL   Alkaline Phosphatase 73 40 - 150 U/L   AST 15 5 - 34 U/L   ALT 15 0 - 55 U/L   Total Protein 7.0 6.4 - 8.3 g/dL   Albumin 2.9 (L) 3.5 - 5.0 g/dL   Calcium 9.4 8.4 - 10.4 mg/dL   Anion Gap 11 3 - 11 mEq/L   EGFR 74 (L) >90 ml/min/1.73 m2    CMET resulted after visit Studies/Results:  No results found.  Medications: I have reviewed the patient's current medications. Begin granix today x 3 days, recheck CBC on 7-11 with further granix then if ANC <1.5. She will use claritin thru granix injections WIll add oral K and follow, reportedly hypokalemia was a problem with prior CDDP.  DISCUSSION: MD and RN both  instructed on neutropenic precautions. Discussed mechanism of action of gCSF, possible aches, claritin as above. Discussed option of neulasta after subsequent chemo treatments, which she would prefer due to less appointments, particularly as she wants to return to work in August. Neulasta will need prior authorization, could be done by on pro injector (place on day 2 of each cycle).  Patient requests appointments for MD after 3 pm when she returns to work.  She understands and agrees with chemo/ avastin over 2 days due to length of treatments.  Schedulers to follow up CT for 7-11, not yet showing in EMR.  Assessment/Plan:   1.recurrent/ progressive adenocarcinoma of cervix: clinical IIB at diagnosis 04-2014 treated initially with radiation and sensitizing cisplatinum, now with metastatic disease to pelvis and retroperitoneum, with right hydronephrosis. Systemic treatment is in attempt to control disease, with CDDP, taxol and avastin. First CDDP  Taxol 11-19-14, neutropenic day 14, gCSF as above. Will image right perinephric area prior to adding avastin on day 2 cycle 2. Next MD visit with lab 12-16-14, tho will follow up counts on 7-11 and CT on 7-11.  2. Urosepsis related to right hydronephrosis and right perinephric fluid collection, with hospitalization 5-17 thru 10-18-14. Post percutaneous drainage of right perinephric fluid collection with small residual by PET 11-04-14. Right ureteral stent placed by Dr Junious Silk 10-13-14. Noncontrast CT to be sure stable right perinephric area prior to adding avastin on 12-10-14.Marland Kitchen 3.C difficile colitis during hospitalization, with flagyl course completed, no diarrhea. 4.post transfusion PRBCs 10-12-14 for Hgb 5.0. Mildly iron deficient by labs, begin hemocyte/ ferrous fumarate when constipation improved. 5. Intolerance to morphine and codeine causing nausea, has never tried dilaudid 6.weight loss 47 lbs from Feb to May 2016. Some improvement in po intake in past week, encouraged continuing supplements 7.constipation, hemorrhoidal discomfort: improved 8.intermittent asthma, none x months 9. 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 12.last mammograms 11-2013, post cholecystectomy    All questions answered. Labs reviewed and discussed. CDDP and taxol orders confirmed for 7-14 and avastin + IVF orders for 7-15 (need to add IV K on day 2 if compatible, message to pharmacy). Request to financial staff for neulasta preauthorization (presently granix is covered). Order placed for neulasta by on pro 7-15 as long as authorization obtained. Patient knows to contact if temp >=100.5 or symptoms of infection while neutropenic, or if other concerns. Time spent 40 min including >50% counseling and coordination of care.   LIVESAY,LENNIS P, MD   12/02/2014,  3:01 PM

## 2014-12-03 ENCOUNTER — Ambulatory Visit (HOSPITAL_BASED_OUTPATIENT_CLINIC_OR_DEPARTMENT_OTHER): Payer: BC Managed Care – PPO

## 2014-12-03 ENCOUNTER — Other Ambulatory Visit: Payer: Self-pay | Admitting: Oncology

## 2014-12-03 ENCOUNTER — Telehealth: Payer: Self-pay

## 2014-12-03 VITALS — BP 100/86 | HR 91 | Temp 98.3°F

## 2014-12-03 DIAGNOSIS — C786 Secondary malignant neoplasm of retroperitoneum and peritoneum: Secondary | ICD-10-CM

## 2014-12-03 DIAGNOSIS — C530 Malignant neoplasm of endocervix: Secondary | ICD-10-CM | POA: Diagnosis not present

## 2014-12-03 DIAGNOSIS — C539 Malignant neoplasm of cervix uteri, unspecified: Secondary | ICD-10-CM

## 2014-12-03 DIAGNOSIS — D709 Neutropenia, unspecified: Secondary | ICD-10-CM

## 2014-12-03 MED ORDER — TBO-FILGRASTIM 300 MCG/0.5ML ~~LOC~~ SOSY
300.0000 ug | PREFILLED_SYRINGE | Freq: Once | SUBCUTANEOUS | Status: AC
  Administered 2014-12-03: 300 ug via SUBCUTANEOUS
  Filled 2014-12-03: qty 0.5

## 2014-12-03 NOTE — Telephone Encounter (Signed)
Told Stacy Webster the result of the KCL level as noted below by [name].   Stacy Webster has a bottle of KCL 10 meq tabs at home.  She has >60 tabs in bottle.  She will use these and begin today to take 2 tablets daily.

## 2014-12-03 NOTE — Telephone Encounter (Signed)
-----   Message from Gordy Levan, MD sent at 12/03/2014 12:24 PM EDT ----- Labs seen and need follow up: K a little low on the CDDP, magnesium still ok. Needs to start K 20 mEq daily, use 10 mEq tabs if she does not like to swallow larger size  QS 30 days

## 2014-12-04 ENCOUNTER — Other Ambulatory Visit: Payer: Self-pay | Admitting: Oncology

## 2014-12-04 ENCOUNTER — Ambulatory Visit (HOSPITAL_BASED_OUTPATIENT_CLINIC_OR_DEPARTMENT_OTHER): Payer: BC Managed Care – PPO

## 2014-12-04 VITALS — BP 139/71 | HR 100 | Temp 98.4°F | Resp 16

## 2014-12-04 DIAGNOSIS — D701 Agranulocytosis secondary to cancer chemotherapy: Secondary | ICD-10-CM | POA: Insufficient documentation

## 2014-12-04 DIAGNOSIS — Z5189 Encounter for other specified aftercare: Secondary | ICD-10-CM

## 2014-12-04 DIAGNOSIS — C539 Malignant neoplasm of cervix uteri, unspecified: Secondary | ICD-10-CM

## 2014-12-04 DIAGNOSIS — C530 Malignant neoplasm of endocervix: Secondary | ICD-10-CM

## 2014-12-04 DIAGNOSIS — G620 Drug-induced polyneuropathy: Secondary | ICD-10-CM | POA: Insufficient documentation

## 2014-12-04 DIAGNOSIS — C786 Secondary malignant neoplasm of retroperitoneum and peritoneum: Secondary | ICD-10-CM | POA: Diagnosis not present

## 2014-12-04 DIAGNOSIS — T451X5A Adverse effect of antineoplastic and immunosuppressive drugs, initial encounter: Secondary | ICD-10-CM

## 2014-12-04 MED ORDER — TBO-FILGRASTIM 300 MCG/0.5ML ~~LOC~~ SOSY
300.0000 ug | PREFILLED_SYRINGE | Freq: Once | SUBCUTANEOUS | Status: AC
  Administered 2014-12-04: 300 ug via SUBCUTANEOUS

## 2014-12-06 ENCOUNTER — Other Ambulatory Visit (HOSPITAL_BASED_OUTPATIENT_CLINIC_OR_DEPARTMENT_OTHER): Payer: BC Managed Care – PPO

## 2014-12-06 ENCOUNTER — Telehealth: Payer: Self-pay | Admitting: *Deleted

## 2014-12-06 ENCOUNTER — Encounter (HOSPITAL_COMMUNITY): Payer: Self-pay

## 2014-12-06 ENCOUNTER — Ambulatory Visit: Payer: BC Managed Care – PPO

## 2014-12-06 ENCOUNTER — Ambulatory Visit (HOSPITAL_COMMUNITY)
Admission: RE | Admit: 2014-12-06 | Discharge: 2014-12-06 | Disposition: A | Discharge status: Home / Self Care | Payer: BC Managed Care – PPO | Source: Ambulatory Visit | Attending: Oncology | Admitting: Oncology

## 2014-12-06 ENCOUNTER — Other Ambulatory Visit: Payer: Self-pay | Admitting: Oncology

## 2014-12-06 DIAGNOSIS — C539 Malignant neoplasm of cervix uteri, unspecified: Secondary | ICD-10-CM

## 2014-12-06 DIAGNOSIS — C530 Malignant neoplasm of endocervix: Secondary | ICD-10-CM

## 2014-12-06 DIAGNOSIS — C7989 Secondary malignant neoplasm of other specified sites: Secondary | ICD-10-CM

## 2014-12-06 DIAGNOSIS — N2889 Other specified disorders of kidney and ureter: Secondary | ICD-10-CM

## 2014-12-06 LAB — COMPREHENSIVE METABOLIC PANEL (CC13)
ALBUMIN: 2.9 g/dL — AB (ref 3.5–5.0)
ALK PHOS: 106 U/L (ref 40–150)
ALT: 11 U/L (ref 0–55)
AST: 14 U/L (ref 5–34)
Anion Gap: 11 mEq/L (ref 3–11)
BUN: 9.3 mg/dL (ref 7.0–26.0)
CO2: 28 meq/L (ref 22–29)
Calcium: 9.2 mg/dL (ref 8.4–10.4)
Chloride: 101 mEq/L (ref 98–109)
Creatinine: 1 mg/dL (ref 0.6–1.1)
EGFR: 65 mL/min/{1.73_m2} — ABNORMAL LOW (ref >90)
Glucose: 131 mg/dl (ref 70–140)
Potassium: 4.2 mEq/L (ref 3.5–5.1)
Sodium: 140 mEq/L (ref 136–145)
Total Protein: 7 g/dL (ref 6.4–8.3)

## 2014-12-06 LAB — CBC WITH DIFFERENTIAL/PLATELET
BASO%: 0.4 % (ref 0.0–2.0)
BASOS ABS: 0 10*3/uL (ref 0.0–0.1)
EOS ABS: 0.4 10*3/uL (ref 0.0–0.5)
EOS%: 4.2 % (ref 0.0–7.0)
HCT: 29.8 % — ABNORMAL LOW (ref 34.8–46.6)
HEMOGLOBIN: 9.8 g/dL — AB (ref 11.6–15.9)
LYMPH%: 7.2 % — ABNORMAL LOW (ref 14.0–49.7)
MCH: 30.2 pg (ref 25.1–34.0)
MCHC: 33 g/dL (ref 31.5–36.0)
MCV: 91.5 fL (ref 79.5–101.0)
MONO#: 1.3 10*3/uL — ABNORMAL HIGH (ref 0.1–0.9)
MONO%: 12.6 % (ref 0.0–14.0)
NEUT#: 7.6 10*3/uL — ABNORMAL HIGH (ref 1.5–6.5)
NEUT%: 75.6 % (ref 38.4–76.8)
Platelets: 398 10*3/uL (ref 145–400)
RBC: 3.26 10*6/uL — ABNORMAL LOW (ref 3.70–5.45)
RDW: 21.5 % — AB (ref 11.2–14.5)
WBC: 10 10*3/uL (ref 3.9–10.3)
lymph#: 0.7 10*3/uL — ABNORMAL LOW (ref 0.9–3.3)

## 2014-12-06 LAB — UA PROTEIN, DIPSTICK - CHCC: Protein, ur: 100 mg/dL

## 2014-12-06 LAB — MAGNESIUM (CC13): MAGNESIUM: 1.8 mg/dL (ref 1.5–2.5)

## 2014-12-06 NOTE — Telephone Encounter (Signed)
-----   Message from Gordy Levan, MD sent at 12/06/2014 11:14 AM EDT ----- Please let patient know that CT today looks ok to begin avastin with her chemo this week.  Tell her: They still see a very little bit of fluid around right kidney, less towards the bottom of kidney than at last imaging and stable to slightly more towards top of kidney - this does not surprise me and seems ok. The scan does not show any increase in lymph nodes and the stent is in good position.  Fine to give her copy of scan report when she comes for Rx, if she wants.   thanks

## 2014-12-06 NOTE — Progress Notes (Signed)
Granix injection cancelled per Dr Marko Plume.  ANC 7.6 today.  Will continue chemo as scheduled 7/14 and will get CBC on 7/14 prior to treatment.  Cancel blood work scheduled 7/13 per Dr Marko Plume.

## 2014-12-06 NOTE — Telephone Encounter (Signed)
Pt notified of results below 

## 2014-12-08 ENCOUNTER — Other Ambulatory Visit: Payer: BC Managed Care – PPO

## 2014-12-09 ENCOUNTER — Other Ambulatory Visit (HOSPITAL_BASED_OUTPATIENT_CLINIC_OR_DEPARTMENT_OTHER): Payer: BC Managed Care – PPO

## 2014-12-09 ENCOUNTER — Ambulatory Visit (HOSPITAL_BASED_OUTPATIENT_CLINIC_OR_DEPARTMENT_OTHER): Payer: BC Managed Care – PPO

## 2014-12-09 VITALS — BP 143/78 | HR 99 | Temp 98.8°F | Resp 18

## 2014-12-09 DIAGNOSIS — C539 Malignant neoplasm of cervix uteri, unspecified: Secondary | ICD-10-CM

## 2014-12-09 DIAGNOSIS — C786 Secondary malignant neoplasm of retroperitoneum and peritoneum: Secondary | ICD-10-CM | POA: Diagnosis not present

## 2014-12-09 DIAGNOSIS — C530 Malignant neoplasm of endocervix: Secondary | ICD-10-CM

## 2014-12-09 DIAGNOSIS — Z5111 Encounter for antineoplastic chemotherapy: Secondary | ICD-10-CM | POA: Diagnosis not present

## 2014-12-09 DIAGNOSIS — C7989 Secondary malignant neoplasm of other specified sites: Secondary | ICD-10-CM

## 2014-12-09 LAB — CBC WITH DIFFERENTIAL/PLATELET
BASO%: 0.4 % (ref 0.0–2.0)
BASOS ABS: 0 10*3/uL (ref 0.0–0.1)
EOS%: 0.2 % (ref 0.0–7.0)
Eosinophils Absolute: 0 10*3/uL (ref 0.0–0.5)
HCT: 30.4 % — ABNORMAL LOW (ref 34.8–46.6)
HEMOGLOBIN: 10.1 g/dL — AB (ref 11.6–15.9)
LYMPH%: 4.3 % — ABNORMAL LOW (ref 14.0–49.7)
MCH: 30.9 pg (ref 25.1–34.0)
MCHC: 33.2 g/dL (ref 31.5–36.0)
MCV: 93 fL (ref 79.5–101.0)
MONO#: 0.2 10*3/uL (ref 0.1–0.9)
MONO%: 2.7 % (ref 0.0–14.0)
NEUT%: 92.4 % — ABNORMAL HIGH (ref 38.4–76.8)
NEUTROS ABS: 7.7 10*3/uL — AB (ref 1.5–6.5)
Platelets: 379 10*3/uL (ref 145–400)
RBC: 3.27 10*6/uL — ABNORMAL LOW (ref 3.70–5.45)
RDW: 21.2 % — ABNORMAL HIGH (ref 11.2–14.5)
WBC: 8.3 10*3/uL (ref 3.9–10.3)
lymph#: 0.4 10*3/uL — ABNORMAL LOW (ref 0.9–3.3)

## 2014-12-09 LAB — COMPREHENSIVE METABOLIC PANEL (CC13)
ALK PHOS: 81 U/L (ref 40–150)
ALT: 7 U/L (ref 0–55)
ANION GAP: 10 meq/L (ref 3–11)
AST: 9 U/L (ref 5–34)
Albumin: 3 g/dL — ABNORMAL LOW (ref 3.5–5.0)
BUN: 18.6 mg/dL (ref 7.0–26.0)
CO2: 24 mEq/L (ref 22–29)
Calcium: 9.6 mg/dL (ref 8.4–10.4)
Chloride: 103 mEq/L (ref 98–109)
Creatinine: 1 mg/dL (ref 0.6–1.1)
EGFR: 67 mL/min/{1.73_m2} — AB (ref >90)
GLUCOSE: 205 mg/dL — AB (ref 70–140)
Potassium: 4.3 mEq/L (ref 3.5–5.1)
Sodium: 136 mEq/L (ref 136–145)
TOTAL PROTEIN: 7.4 g/dL (ref 6.4–8.3)

## 2014-12-09 LAB — UA PROTEIN, DIPSTICK - CHCC: Protein, ur: 30 mg/dL

## 2014-12-09 LAB — MAGNESIUM (CC13): MAGNESIUM: 1.8 mg/dL (ref 1.5–2.5)

## 2014-12-09 MED ORDER — DIPHENHYDRAMINE HCL 50 MG/ML IJ SOLN
50.0000 mg | Freq: Once | INTRAMUSCULAR | Status: AC
Start: 2014-12-09 — End: 2014-12-09
  Administered 2014-12-09: 50 mg via INTRAVENOUS

## 2014-12-09 MED ORDER — SODIUM CHLORIDE 0.9 % IV SOLN
Freq: Once | INTRAVENOUS | Status: AC
  Administered 2014-12-09: 12:00:00 via INTRAVENOUS
  Filled 2014-12-09: qty 8

## 2014-12-09 MED ORDER — LORAZEPAM 1 MG PO TABS
ORAL_TABLET | ORAL | Status: AC
  Filled 2014-12-09: qty 1

## 2014-12-09 MED ORDER — FAMOTIDINE IN NACL 20-0.9 MG/50ML-% IV SOLN
20.0000 mg | Freq: Once | INTRAVENOUS | Status: AC
  Administered 2014-12-09: 20 mg via INTRAVENOUS

## 2014-12-09 MED ORDER — SODIUM CHLORIDE 0.9 % IV SOLN
Freq: Once | INTRAVENOUS | Status: AC
  Administered 2014-12-09: 11:00:00 via INTRAVENOUS
  Filled 2014-12-09: qty 5

## 2014-12-09 MED ORDER — LORAZEPAM 2 MG/ML IJ SOLN: 0.5000 mg | Freq: Once | INTRAMUSCULAR | Status: DC

## 2014-12-09 MED ORDER — FAMOTIDINE IN NACL 20-0.9 MG/50ML-% IV SOLN
INTRAVENOUS | Status: AC
  Filled 2014-12-09: qty 50

## 2014-12-09 MED ORDER — SODIUM CHLORIDE 0.9 % IV SOLN
Freq: Once | INTRAVENOUS | Status: AC
  Administered 2014-12-09: 09:00:00 via INTRAVENOUS

## 2014-12-09 MED ORDER — LORAZEPAM 1 MG PO TABS
0.5000 mg | ORAL_TABLET | Freq: Once | ORAL | Status: AC
  Administered 2014-12-09: 0.5 mg via ORAL

## 2014-12-09 MED ORDER — POTASSIUM CHLORIDE 2 MEQ/ML IV SOLN
INTRAVENOUS | Status: DC
  Administered 2014-12-09: 09:00:00 via INTRAVENOUS
  Filled 2014-12-09: qty 1000

## 2014-12-09 MED ORDER — SODIUM CHLORIDE 0.9 % IV SOLN
40.0000 mg/m2 | Freq: Once | INTRAVENOUS | Status: AC
  Administered 2014-12-09: 70 mg via INTRAVENOUS
  Filled 2014-12-09: qty 70

## 2014-12-09 MED ORDER — PACLITAXEL CHEMO INJECTION 300 MG/50ML
135.0000 mg/m2 | Freq: Once | INTRAVENOUS | Status: AC
  Administered 2014-12-09: 234 mg via INTRAVENOUS
  Filled 2014-12-09: qty 39

## 2014-12-09 MED ORDER — DIPHENHYDRAMINE HCL 50 MG/ML IJ SOLN
INTRAMUSCULAR | Status: AC
  Filled 2014-12-09: qty 1

## 2014-12-09 NOTE — Patient Instructions (Signed)
[facility] Discharge Instructions for Patients Receiving Chemotherapy  Today you received the following chemotherapy agents Taxol/Cisplatin To help prevent nausea and vomiting after your treatment, we encourage you to take your nausea medication as prescribed.   If you develop nausea and vomiting that is not controlled by your nausea medication, call the clinic.   BELOW ARE SYMPTOMS THAT SHOULD BE REPORTED IMMEDIATELY:  *FEVER GREATER THAN 100.5 F  *CHILLS WITH OR WITHOUT FEVER  NAUSEA AND VOMITING THAT IS NOT CONTROLLED WITH YOUR NAUSEA MEDICATION  *UNUSUAL SHORTNESS OF BREATH  *UNUSUAL BRUISING OR BLEEDING  TENDERNESS IN MOUTH AND THROAT WITH OR WITHOUT PRESENCE OF ULCERS  *URINARY PROBLEMS  *BOWEL PROBLEMS  UNUSUAL RASH Items with * indicate a potential emergency and should be followed up as soon as possible.  Feel free to call the clinic you have any questions or concerns. The clinic phone number is [phone].  Please show the [place] at check-in to the Emergency Department and triage nurse.

## 2014-12-09 NOTE — Progress Notes (Signed)
Cisplatin hydration fluids are compatible with Taxol per Johny Drilling, Pharmacist.

## 2014-12-10 ENCOUNTER — Ambulatory Visit (HOSPITAL_BASED_OUTPATIENT_CLINIC_OR_DEPARTMENT_OTHER): Payer: BC Managed Care – PPO

## 2014-12-10 VITALS — BP 138/82 | HR 82 | Temp 98.2°F | Resp 18

## 2014-12-10 DIAGNOSIS — C7989 Secondary malignant neoplasm of other specified sites: Secondary | ICD-10-CM

## 2014-12-10 DIAGNOSIS — Z5112 Encounter for antineoplastic immunotherapy: Secondary | ICD-10-CM

## 2014-12-10 DIAGNOSIS — C539 Malignant neoplasm of cervix uteri, unspecified: Secondary | ICD-10-CM

## 2014-12-10 DIAGNOSIS — Z5189 Encounter for other specified aftercare: Secondary | ICD-10-CM

## 2014-12-10 MED ORDER — POTASSIUM CHLORIDE 2 MEQ/ML IV SOLN
Freq: Once | INTRAVENOUS | Status: AC
  Administered 2014-12-10: 12:00:00 via INTRAVENOUS
  Filled 2014-12-10: qty 1000

## 2014-12-10 MED ORDER — PEGFILGRASTIM 6 MG/0.6ML ~~LOC~~ PSKT
6.0000 mg | PREFILLED_SYRINGE | Freq: Once | SUBCUTANEOUS | Status: AC
  Administered 2014-12-10: 6 mg via SUBCUTANEOUS
  Filled 2014-12-10: qty 0.6

## 2014-12-10 MED ORDER — SODIUM CHLORIDE 0.9 % IV SOLN
1000.0000 mg | Freq: Once | INTRAVENOUS | Status: AC
  Administered 2014-12-10: 1000 mg via INTRAVENOUS
  Filled 2014-12-10: qty 40

## 2014-12-10 MED ORDER — SODIUM CHLORIDE 0.9 % IV SOLN
Freq: Once | INTRAVENOUS | Status: AC
  Administered 2014-12-10: 11:00:00 via INTRAVENOUS

## 2014-12-10 NOTE — Patient Instructions (Signed)
Bevacizumab injection  What is this medicine?  BEVACIZUMAB (be va SIZ yoo mab) is a chemotherapy drug. It targets a protein found in many cancer cell types, and halts cancer growth. This drug treats many cancers including non-small cell lung cancer, ovarian cancer, cervical cancer, and colon or rectal cancer. It is usually given with other chemotherapy drugs.  This medicine may be used for other purposes; ask your health care provider or pharmacist if you have questions.  COMMON BRAND NAME(S): Avastin  What should I tell my health care provider before I take this medicine?  They need to know if you have any of these conditions:  -blood clots  -heart disease, including heart failure, heart attack, or chest pain (angina)  -high blood pressure  -infection (especially a virus infection such as chickenpox, cold sores, or herpes)  -kidney disease  -lung disease  -prior chemotherapy with doxorubicin, daunorubicin, epirubicin, or other anthracycline type chemotherapy agents  -recent or ongoing radiation therapy  -recent surgery  -stroke  -an unusual or allergic reaction to bevacizumab, hamster proteins, mouse proteins, other medicines, foods, dyes, or preservatives  -pregnant or trying to get pregnant  -breast-feeding  How should I use this medicine?  This medicine is for infusion into a vein. It is given by a health care professional in a hospital or clinic setting.  Talk to your pediatrician regarding the use of this medicine in children. Special care may be needed.  Overdosage: If you think you have taken too much of this medicine contact a poison control center or emergency room at once.  NOTE: This medicine is only for you. Do not share this medicine with others.  What if I miss a dose?  It is important not to miss your dose. Call your doctor or health care professional if you are unable to keep an appointment.  What may interact with this medicine?  Interactions are not expected.  This list may not describe all  possible interactions. Give your health care provider a list of all the medicines, herbs, non-prescription drugs, or dietary supplements you use. Also tell them if you smoke, drink alcohol, or use illegal drugs. Some items may interact with your medicine.  What should I watch for while using this medicine?  Your condition will be monitored carefully while you are receiving this medicine. You will need important blood work and urine testing done while you are taking this medicine.  During your treatment, let your health care professional know if you have any unusual symptoms, such as difficulty breathing.  This medicine may rarely cause 'gastrointestinal perforation' (holes in the stomach, intestines or colon), a serious side effect requiring surgery to repair.  This medicine should be started at least 28 days following major surgery and the site of the surgery should be totally healed. Check with your doctor before scheduling dental work or surgery while you are receiving this treatment. Talk to your doctor if you have recently had surgery or if you have a wound that has not healed.  Do not become pregnant while taking this medicine. Women should inform their doctor if they wish to become pregnant or think they might be pregnant. There is a potential for serious side effects to an unborn child. Talk to your health care professional or pharmacist for more information. Do not breast-feed an infant while taking this medicine.  This medicine has caused ovarian failure in some women. This medicine may interfere with the ability to have a child. You should talk to   your doctor or health care professional if you are concerned about your fertility.  What side effects may I notice from receiving this medicine?  Side effects that you should report to your doctor or health care professional as soon as possible:  -allergic reactions like skin rash, itching or hives, swelling of the face, lips, or tongue  -signs of infection -  fever or chills, cough, sore throat, pain or trouble passing urine  -signs of decreased platelets or bleeding - bruising, pinpoint red spots on the skin, black, tarry stools, nosebleeds, blood in the urine  -breathing problems  -changes in vision  -chest pain  -confusion  -jaw pain, especially after dental work  -mouth sores  -seizures  -severe abdominal pain  -severe headache  -sudden numbness or weakness of the face, arm or leg  -swelling of legs or ankles  -symptoms of a stroke: change in mental awareness, inability to talk or move one side of the body (especially in patients with lung cancer)  -trouble passing urine or change in the amount of urine  -trouble speaking or understanding  -trouble walking, dizziness, loss of balance or coordination  Side effects that usually do not require medical attention (report to your doctor or health care professional if they continue or are bothersome):  -constipation  -diarrhea  -dry skin  -headache  -loss of appetite  -nausea, vomiting  This list may not describe all possible side effects. Call your doctor for medical advice about side effects. You may report side effects to FDA at 1-800-FDA-1088.  Where should I keep my medicine?  This drug is given in a hospital or clinic and will not be stored at home.  NOTE: This sheet is a summary. It may not cover all possible information. If you have questions about this medicine, talk to your doctor, pharmacist, or health care provider.   2015, [employer]vier/Gold Standard. (2013-04-14 11:38:34)

## 2014-12-12 ENCOUNTER — Other Ambulatory Visit: Payer: Self-pay | Admitting: Oncology

## 2014-12-12 DIAGNOSIS — C539 Malignant neoplasm of cervix uteri, unspecified: Secondary | ICD-10-CM

## 2014-12-12 DIAGNOSIS — C7989 Secondary malignant neoplasm of other specified sites: Secondary | ICD-10-CM

## 2014-12-16 ENCOUNTER — Ambulatory Visit (HOSPITAL_BASED_OUTPATIENT_CLINIC_OR_DEPARTMENT_OTHER): Payer: BC Managed Care – PPO | Admitting: Oncology

## 2014-12-16 ENCOUNTER — Telehealth: Payer: Self-pay | Admitting: Oncology

## 2014-12-16 ENCOUNTER — Encounter: Payer: Self-pay | Admitting: Oncology

## 2014-12-16 ENCOUNTER — Ambulatory Visit (HOSPITAL_BASED_OUTPATIENT_CLINIC_OR_DEPARTMENT_OTHER): Payer: BC Managed Care – PPO

## 2014-12-16 VITALS — BP 138/84 | HR 94 | Temp 98.5°F | Resp 18 | Ht 64.0 in | Wt 150.7 lb

## 2014-12-16 DIAGNOSIS — C7989 Secondary malignant neoplasm of other specified sites: Secondary | ICD-10-CM | POA: Diagnosis not present

## 2014-12-16 DIAGNOSIS — N1330 Unspecified hydronephrosis: Secondary | ICD-10-CM

## 2014-12-16 DIAGNOSIS — G620 Drug-induced polyneuropathy: Secondary | ICD-10-CM

## 2014-12-16 DIAGNOSIS — D701 Agranulocytosis secondary to cancer chemotherapy: Secondary | ICD-10-CM | POA: Diagnosis not present

## 2014-12-16 DIAGNOSIS — C539 Malignant neoplasm of cervix uteri, unspecified: Secondary | ICD-10-CM

## 2014-12-16 DIAGNOSIS — N2889 Other specified disorders of kidney and ureter: Secondary | ICD-10-CM

## 2014-12-16 DIAGNOSIS — G622 Polyneuropathy due to other toxic agents: Secondary | ICD-10-CM | POA: Diagnosis not present

## 2014-12-16 DIAGNOSIS — T451X5A Adverse effect of antineoplastic and immunosuppressive drugs, initial encounter: Secondary | ICD-10-CM

## 2014-12-16 LAB — CBC WITH DIFFERENTIAL/PLATELET
BASO%: 1 % (ref 0.0–2.0)
Basophils Absolute: 0.1 10*3/uL (ref 0.0–0.1)
EOS%: 6.6 % (ref 0.0–7.0)
Eosinophils Absolute: 0.3 10*3/uL (ref 0.0–0.5)
HEMATOCRIT: 30.3 % — AB (ref 34.8–46.6)
HGB: 9.9 g/dL — ABNORMAL LOW (ref 11.6–15.9)
LYMPH%: 15.3 % (ref 14.0–49.7)
MCH: 30.4 pg (ref 25.1–34.0)
MCHC: 32.7 g/dL (ref 31.5–36.0)
MCV: 92.9 fL (ref 79.5–101.0)
MONO#: 1 10*3/uL — ABNORMAL HIGH (ref 0.1–0.9)
MONO%: 19.1 % — AB (ref 0.0–14.0)
NEUT%: 58 % (ref 38.4–76.8)
NEUTROS ABS: 2.9 10*3/uL (ref 1.5–6.5)
PLATELETS: 189 10*3/uL (ref 145–400)
RBC: 3.26 10*6/uL — AB (ref 3.70–5.45)
RDW: 19.2 % — AB (ref 11.2–14.5)
WBC: 5 10*3/uL (ref 3.9–10.3)
lymph#: 0.8 10*3/uL — ABNORMAL LOW (ref 0.9–3.3)

## 2014-12-16 LAB — COMPREHENSIVE METABOLIC PANEL (CC13)
ALBUMIN: 3.2 g/dL — AB (ref 3.5–5.0)
ALT: 9 U/L (ref 0–55)
ANION GAP: 6 meq/L (ref 3–11)
AST: 10 U/L (ref 5–34)
Alkaline Phosphatase: 113 U/L (ref 40–150)
BUN: 17 mg/dL (ref 7.0–26.0)
CO2: 27 meq/L (ref 22–29)
Calcium: 9.1 mg/dL (ref 8.4–10.4)
Chloride: 102 mEq/L (ref 98–109)
Creatinine: 0.9 mg/dL (ref 0.6–1.1)
EGFR: 78 mL/min/{1.73_m2} — ABNORMAL LOW (ref >90)
GLUCOSE: 107 mg/dL (ref 70–140)
POTASSIUM: 3.9 meq/L (ref 3.5–5.1)
Sodium: 135 mEq/L — ABNORMAL LOW (ref 136–145)
Total Bilirubin: <0.2 mg/dL (ref 0.20–1.20)
Total Protein: 6.8 g/dL (ref 6.4–8.3)

## 2014-12-16 LAB — MAGNESIUM (CC13): MAGNESIUM: 1.6 mg/dL (ref 1.5–2.5)

## 2014-12-16 NOTE — Telephone Encounter (Signed)
Gave avs & calednar for Augsut

## 2014-12-16 NOTE — Progress Notes (Signed)
OFFICE PROGRESS NOTE   December 16, 2014   Physicians:Emma Carita Pian, MD, Michel Bickers, Leonidas Romberg, Genia Del, Herma Ard  INTERVAL HISTORY:  Patient is seen, together with sister in law, in continuing attention to treatment in process for recurrent adenocarcinoma of cervix involving pelvis.  Area of previous right perinephric fluid collection was reevaluated with noncontrast CT on 12-06-14 prior to first avastin, with minimal fluid present since drain removal. She had cycle 2 CDDP taxol on 12-09-14 with first avastin + IVF on 12-10-14, and neulasta by [facility] injector.   Treatment is planned every 3 weeks, however they have a family beach trip week of Aug 6, so she would like to delay next treatment until after that trip.  Patient today reports multiple episodes of watery diarrhea 7-17 thru 12-14-14, resolved after self medicated with total 6 tablets of lomotil. NOTE she had C diff diarrhea in 09-2014. No one at home had similar symptoms. I have again instructed patient to let us know if she has diarrhea. (Sister in law was not aware of the diarrhea). Patient reports formed BM today. Nausea was not too bothersome after recent chemo, tho IVF helpful. She has been able to eat and to drink lemonade. She denies bleeding, abdominal or pelvic pain, LE swelling, increased fatigue or SOB. Peripheral IV access was adequate; she understands that she would need to hold avastin if PAC placed now. She did not have severe aches with taxol or neulasta.  No PAC    ONCOLOGIC HISTORY Patient had been in usual good health, with no previous abnormal PAP smears, when she developed frequent urination and vaginal bleeding in late 2015. She saw PCP Dr Brigitte Pulse, then had endometrial biopsy by Dr Janyth Pupa [phone]) with adenocarcinoma. PET in [facility] system 08-19-38 had hypermetabolic uptake within the uterine cervix and lower uterine segment and metastatic adenopathy within the bilateral  pelvis and periaortic retroperitoneum. Dr Genia Del and Dr Marguerita Merles treated with pelvic and para-aortic radiation given with 6 cycles of sensitizing CDDP, and HDR x5 from 07-06-14 thru 09-03-14. Patient had a difficult time with nausea, vomiting, diarrhea and hypokalemia/ hypomagnesemia thru treatment. Patient used ODT zofran and ativan during chemotherapy. She had a difficult time doing oral prehydration for CDDP. She had no peripheral neuropathy with the CDDP. Chemotherapy information is not available at time of this consultation and will be requested; patient does not know if she received EMEND with CDDP. There was concern by completion of the treatment that she did not have optimal response, with plan for repeat evaluation at 6 weeks from completion of the treatment. Prior to planned reevaluation, patient began having daily fevers for possibly 4 weeks, as high as 102 degrees x 2 weeks prior to 10-11-14; by presentation at ED she also had increased right flank pain. She had [facility] on 10-08-14, this compared with PET of 05-2014 showed increase in subcapsular fluid collection right kidney and right hydronephrosis, increase in size of endometrial canal/ uterus/ right adnexa, and improvement in retroperitoneal, common iliac and bilateral pelvic sidewall adenopathy. Symptoms worsened and she was admitted thru [facility] ED to ICU on 5-16/ 10-12-14 with sepsis from peptostreptococcus. She had percutaneous drainage by IR of 635 cc dark thin fluid from right perinephric area on 10-12-14, with perinephric drain removed by urology on 10-18-14. She had right ureteral stent placement by Dr Junious Silk on 10-13-14. She developed C.difficile diarrhea, begun on flagyl 10-15-14 x 2 weeks, also on ceftin during  that time. Patient understands that she is to have the ureteral stent changed in 3 months.  She saw Dr [name] in consultation on 11-05-14, with cervix grossly normal appearance, firm to  palpation with right anterior extension, and purulent material from cervical os with manipulation, no palpable adnexal masses, induration R>L parametrium. PET 01-31-74 had hypermetabolic tissue thickening at cecum, + small right sided mesenteric and inguinal nodes, foci in myometriium and in region of cervix, small right perinephric fluid collection and dilated fluid filled endometrium. Dr [name] saw her following the PET, with recommendation for systemic treatment of the metastatic disease with CDDP, taxol and avastin. First CDDP Taxol 11-19-14, neutropenic day 14. Repeat CT showed essentially stable, minimal right perinephric fluid on 12-06-14, prior to cycle 2 CDDP taxol 7-14 with first avastin 12-10-14 and neulasta.   Review of systems as above, also: No fever. No increase in peripheral neuropathy.No flank pain.Voiding without difficulty. Energy ok now. Denies increased SOB. Remainder of 10 point Review of Systems negative.  Objective:  Vital signs in last 24 hours:  BP 138/84 mmHg  Pulse 94  Temp(Src) 98.5 F (36.9 C) (Oral)  Resp 18  Ht 5' 4"  (1.626 m)  Wt 150 lb 11.2 oz (68.357 kg)  BMI 25.85 kg/m2  SpO2 100%  LMP 11/05/2011 Weight down 3 lbs Alert, oriented and appropriate. Ambulatory without difficulty, looks comfortable.  HEENT:PERRL, sclerae not icteric. Oral mucosa moist without lesions, posterior pharynx clear.  Neck supple. No JVD.  Lymphatics:no cervical,supraclavicular adenopathy Resp: clear to auscultation bilaterally and normal percussion bilaterally Cardio: regular rate and rhythm. No gallop. GI: soft, nontender, not distended, no mass or organomegaly. Normally active bowel sounds.  Musculoskeletal/ Extremities: without pitting edema, cords, tenderness Neuro: no significant peripheral neuropathy. Otherwise nonfocal. Psych appropriate mood and affect Skin without rash, ecchymosis, petechiae   Lab Results:  Results for orders placed or performed in visit on 12/16/14   CBC with Differential  Result Value Ref Range   WBC 5.0 3.9 - 10.3 10e3/uL   NEUT# 2.9 1.5 - 6.5 10e3/uL   HGB 9.9 (L) 11.6 - 15.9 g/dL   HCT 30.3 (L) 34.8 - 46.6 %   Platelets 189 145 - 400 10e3/uL   MCV 92.9 79.5 - 101.0 fL   MCH 30.4 25.1 - 34.0 pg   MCHC 32.7 31.5 - 36.0 g/dL   RBC 3.26 (L) 3.70 - 5.45 10e6/uL   RDW 19.2 (H) 11.2 - 14.5 %   lymph# 0.8 (L) 0.9 - 3.3 10e3/uL   MONO# 1.0 (H) 0.1 - 0.9 10e3/uL   Eosinophils Absolute 0.3 0.0 - 0.5 10e3/uL   Basophils Absolute 0.1 0.0 - 0.1 10e3/uL   NEUT% 58.0 38.4 - 76.8 %   LYMPH% 15.3 14.0 - 49.7 %   MONO% 19.1 (H) 0.0 - 14.0 %   EOS% 6.6 0.0 - 7.0 %   BASO% 1.0 0.0 - 2.0 %  Comprehensive metabolic panel (Cmet) - [facility]  Result Value Ref Range   Sodium 135 (L) 136 - 145 mEq/L   Potassium 3.9 3.5 - 5.1 mEq/L   Chloride 102 98 - 109 mEq/L   CO2 27 22 - 29 mEq/L   Glucose 107 70 - 140 mg/dl   BUN 17.0 7.0 - 26.0 mg/dL   Creatinine 0.9 0.6 - 1.1 mg/dL   Total Bilirubin <0.20 0.20 - 1.20 mg/dL   Alkaline Phosphatase 113 40 - 150 U/L   AST 10 5 - 34 U/L   ALT 9 0 - 55 U/L  Total Protein 6.8 6.4 - 8.3 g/dL   Albumin 3.2 (L) 3.5 - 5.0 g/dL   Calcium 9.1 8.4 - 10.4 mg/dL   Anion Gap 6 3 - 11 mEq/L   EGFR 78 (L) >90 ml/min/1.73 m2  Magnesium  Result Value Ref Range   Magnesium 1.6 1.5 - 2.5 mg/dl     Studies/Results: : CT ABDOMEN WITHOUT CONTRAST  12-06-14  COMPARISON: PET 11/04/2014 and CT abdomen 10/16/2014.  FINDINGS: Lower chest: Lung bases show no acute findings. There is decreased attenuation of the intravascular compartment, indicative of anemia. Heart size normal. No pericardial or pleural effusion.  Hepatobiliary: Liver is unremarkable. Cholecystectomy. No biliary ductal dilatation.  Pancreas: Pancreatic head appears mildly heterogeneous, stable. Otherwise, negative. No ductal dilatation.  Spleen: Negative.  Adrenals/Urinary Tract: Adrenal glands are unremarkable. Perinephric fluid collection  along the upper pole right kidney measures 10 mm in thickness (series 2, image 31), stable to slightly increased after percutaneous drain removal. A second perinephric collection of fluid is seen along the lateral aspect of the lower pole right kidney, measuring 5 mm (series 2, image 37), previously 8 mm. Double-J right ureteral stent is seen with the proximal loop in the renal pelvis and mild residual hydronephrosis. Left kidney is unremarkable.  Stomach/Bowel: Stomach and visualized portions of the small bowel and colon are unremarkable.  Vascular/Lymphatic: Vascular structures are unremarkable. No pathologically enlarged lymph nodes. Ileocolic mesenteric lymph nodes are sub cm in size. Scattered surgical clips.  Other: No free fluid. Mesenteries and peritoneum are otherwise unremarkable.  Musculoskeletal: No worrisome lytic or sclerotic lesions. Degenerative changes are seen in the spine. Prominent Schmorl's node along superior endplate of L2.  IMPRESSION: 1. Stable to minimally increased right perinephric fluid collection after percutaneous drain removal. 2. Right double-J ureteral stent in place with mild residual hydronephrosis.  Medications: I have reviewed the patient's current medications. RN to confirm home potassium. Add ferrous fumarate daily on empty stomach with vit C tablet. She needs to take protonix daily, not prn.  DISCUSSION She is at risk for recurrent C diff diarrhea and should have stools tested if further diarrhea; she should not take lomotil for diarrhea unless we know that C diff is negative. Meds as above. If she is very fatigued or other concerns next week, should have CBC and BMET rechecked, as counts may still drop even with neulasta. She will see APP with labs on 8-1, to be sure ok prior to beach trip.      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 Systemic treatment is in attempt to control disease, with CDDP, taxol and avastin. Neutropenic day 14 cycle 1. Cycle 2 CDDP taxol 7-14 and first avastin 7-15, with neulasta. Cycle 3 will be delayed to allow her beach trip. 2. Urosepsis 09-2014 related to right hydronephrosis and right perinephric fluid collection. Percutaneous drainage of right perinephric fluid collection and right ureteral stent placed. Noncontrast CT stable right perinephric area prior to adding avastin on 12-10-14.Marland Kitchen 3.C difficile colitis also 09-2014. Diarrhea earlier this week, not reported until today and better now. NEEDS STOOL FOR C DIFF IF ANY FURTHER DIARRHEA. 4.Multifactorial anemia, transfused earlier in course: start ferrous fumarate and follow 5. Intolerance to morphine and codeine causing nausea, has never tried dilaudid 6.weight loss 47 lbs from Feb to May 2016. Some improvement in po intake including supplements 7.constipation, hemorrhoidal  discomfort: improved  8.intermittent asthma, none x months 9. 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 12.last mammograms 11-2013, post cholecystectomy   Patient and family member seem to understand and to be in agreement with instructions and plans. Chemo/avastin/IVF orders adjusted. Time spent 40 min including >50% counseling and coordination of care. Cc Dr Raliegh Ip.Brigitte Pulse and Dr Junious Silk  Gordy Levan, MD   12/16/2014, 2:18 PM

## 2014-12-18 ENCOUNTER — Other Ambulatory Visit: Payer: Self-pay | Admitting: Oncology

## 2014-12-18 DIAGNOSIS — C7989 Secondary malignant neoplasm of other specified sites: Secondary | ICD-10-CM

## 2014-12-20 ENCOUNTER — Telehealth: Payer: Self-pay

## 2014-12-20 NOTE — Telephone Encounter (Signed)
Verified with Ms. Hanratty that she has KCL 10 meq tabs at home.  She was taking 2 tabs daily and last week [name]  told her to decrease ito 1 tablet = 10 meq daily. Added KCL to patient's  medication list.

## 2014-12-20 NOTE — Telephone Encounter (Signed)
-----   Message from Gordy Levan, MD sent at 12/18/2014  3:26 PM EDT ----- I thought she was on oral K, and I noted that I told her to decrease this to one daily - but no K listed on meds. Can you please be sure K is listed if she is on it? Maybe needed refill?   thanks

## 2014-12-21 ENCOUNTER — Telehealth: Payer: Self-pay | Admitting: Oncology

## 2014-12-21 NOTE — Telephone Encounter (Signed)
Added lab/fu for 8/15. Spoke with patient she is aware and will get new schedule 8/1 or check my chart to confirm. Other appointments remain the same.

## 2014-12-27 ENCOUNTER — Ambulatory Visit (HOSPITAL_BASED_OUTPATIENT_CLINIC_OR_DEPARTMENT_OTHER): Payer: BC Managed Care – PPO | Admitting: Oncology

## 2014-12-27 ENCOUNTER — Other Ambulatory Visit (HOSPITAL_BASED_OUTPATIENT_CLINIC_OR_DEPARTMENT_OTHER): Payer: BC Managed Care – PPO

## 2014-12-27 ENCOUNTER — Encounter: Payer: Self-pay | Admitting: Oncology

## 2014-12-27 VITALS — BP 137/80 | HR 88 | Temp 98.1°F | Resp 18 | Ht 64.0 in | Wt 155.7 lb

## 2014-12-27 DIAGNOSIS — D649 Anemia, unspecified: Secondary | ICD-10-CM | POA: Diagnosis not present

## 2014-12-27 DIAGNOSIS — C7889 Secondary malignant neoplasm of other digestive organs: Secondary | ICD-10-CM | POA: Diagnosis not present

## 2014-12-27 DIAGNOSIS — C539 Malignant neoplasm of cervix uteri, unspecified: Secondary | ICD-10-CM

## 2014-12-27 DIAGNOSIS — R634 Abnormal weight loss: Secondary | ICD-10-CM

## 2014-12-27 DIAGNOSIS — E876 Hypokalemia: Secondary | ICD-10-CM

## 2014-12-27 DIAGNOSIS — G620 Drug-induced polyneuropathy: Secondary | ICD-10-CM

## 2014-12-27 DIAGNOSIS — C7989 Secondary malignant neoplasm of other specified sites: Secondary | ICD-10-CM

## 2014-12-27 LAB — CBC WITH DIFFERENTIAL/PLATELET
BASO%: 1 % (ref 0.0–2.0)
Basophils Absolute: 0.1 10*3/uL (ref 0.0–0.1)
EOS ABS: 0.4 10*3/uL (ref 0.0–0.5)
EOS%: 6.8 % (ref 0.0–7.0)
HCT: 30.7 % — ABNORMAL LOW (ref 34.8–46.6)
HGB: 10.2 g/dL — ABNORMAL LOW (ref 11.6–15.9)
LYMPH%: 14.4 % (ref 14.0–49.7)
MCH: 32 pg (ref 25.1–34.0)
MCHC: 33.3 g/dL (ref 31.5–36.0)
MCV: 95.9 fL (ref 79.5–101.0)
MONO#: 0.7 10*3/uL (ref 0.1–0.9)
MONO%: 12.1 % (ref 0.0–14.0)
NEUT%: 65.7 % (ref 38.4–76.8)
NEUTROS ABS: 3.9 10*3/uL (ref 1.5–6.5)
Platelets: 422 10*3/uL — ABNORMAL HIGH (ref 145–400)
RBC: 3.21 10*6/uL — ABNORMAL LOW (ref 3.70–5.45)
RDW: 23.8 % — AB (ref 11.2–14.5)
WBC: 6 10*3/uL (ref 3.9–10.3)
lymph#: 0.9 10*3/uL (ref 0.9–3.3)

## 2014-12-27 LAB — COMPREHENSIVE METABOLIC PANEL (CC13)
ALBUMIN: 3.1 g/dL — AB (ref 3.5–5.0)
ALT: 8 U/L (ref 0–55)
AST: 10 U/L (ref 5–34)
Alkaline Phosphatase: 68 U/L (ref 40–150)
Anion Gap: 6 mEq/L (ref 3–11)
BUN: 16.5 mg/dL (ref 7.0–26.0)
CO2: 28 mEq/L (ref 22–29)
Calcium: 9.3 mg/dL (ref 8.4–10.4)
Chloride: 105 mEq/L (ref 98–109)
Creatinine: 0.9 mg/dL (ref 0.6–1.1)
EGFR: 72 mL/min/{1.73_m2} — ABNORMAL LOW (ref >90)
GLUCOSE: 99 mg/dL (ref 70–140)
POTASSIUM: 4.1 meq/L (ref 3.5–5.1)
Sodium: 139 mEq/L (ref 136–145)
Total Protein: 6.7 g/dL (ref 6.4–8.3)

## 2014-12-27 LAB — MAGNESIUM (CC13): Magnesium: 2.1 mg/dl (ref 1.5–2.5)

## 2014-12-27 LAB — UA PROTEIN, DIPSTICK - CHCC: Protein, ur: 100 mg/dL

## 2014-12-27 NOTE — Progress Notes (Signed)
OFFICE PROGRESS NOTE   December 27, 2014   Physicians:Emma Carita Pian, MD, Michel Bickers, Leonidas Romberg, Genia Del, Herma Ard  INTERVAL HISTORY:  Patient is seen, together with sister in law, in continuing attention to treatment in process for recurrent adenocarcinoma of cervix involving pelvis.  Area of previous right perinephric fluid collection was reevaluated with noncontrast CT on 12-06-14 prior to first avastin, with minimal fluid present since drain removal. She had cycle 2 CDDP taxol on 12-09-14 with first avastin + IVF on 12-10-14, and neulasta by [facility] injector.   Treatment is planned every 3 weeks, however they have a family beach trip week of Aug 6, so she would like to delay next treatment until after that trip.  Patient doing well today with no complaints. Looking  Forward to her vacation. Has not had an y recent diarrhea or nausea. She denies bleeding, abdominal or pelvic pain, LE swelling, increased fatigue or SOB. Peripheral IV access was adequate; she understands that she would need to hold avastin if PAC placed now. She did not have severe aches with taxol or neulasta.  No PAC    ONCOLOGIC HISTORY Patient had been in usual good health, with no previous abnormal PAP smears, when she developed frequent urination and vaginal bleeding in late 2015. She saw PCP Dr Brigitte Pulse, then had endometrial biopsy by Dr Janyth Pupa [phone]) with adenocarcinoma. PET in [place] system 9-70-26 had hypermetabolic uptake within the uterine cervix and lower uterine segment and metastatic adenopathy within the bilateral pelvis and periaortic retroperitoneum. Dr Genia Del and Dr Marguerita Merles treated with pelvic and para-aortic radiation given with 6 cycles of sensitizing CDDP, and HDR x5 from 07-06-14 thru 09-03-14. Patient had a difficult time with nausea, vomiting, diarrhea and hypokalemia/ hypomagnesemia thru treatment. Patient used ODT zofran and ativan  during chemotherapy. She had a difficult time doing oral prehydration for CDDP. She had no peripheral neuropathy with the CDDP. Chemotherapy information is not available at time of this consultation and will be requested; patient does not know if she received EMEND with CDDP. There was concern by completion of the treatment that she did not have optimal response, with plan for repeat evaluation at 6 weeks from completion of the treatment. Prior to planned reevaluation, patient began having daily fevers for possibly 4 weeks, as high as 102 degrees x 2 weeks prior to 10-11-14; by presentation at ED she also had increased right flank pain. She had CT at [facility] on 10-08-14, this compared with PET of 05-2014 showed increase in subcapsular fluid collection right kidney and right hydronephrosis, increase in size of endometrial canal/ uterus/ right adnexa, and improvement in retroperitoneal, common iliac and bilateral pelvic sidewall adenopathy. Symptoms worsened and she was admitted thru [facility] ED to ICU on 5-16/ 10-12-14 with sepsis from peptostreptococcus. She had percutaneous drainage by IR of 635 cc dark thin fluid from right perinephric area on 10-12-14, with perinephric drain removed by urology on 10-18-14. She had right ureteral stent placement by Dr Junious Silk on 10-13-14. She developed C.difficile diarrhea, begun on flagyl 10-15-14 x 2 weeks, also on ceftin during that time. Patient understands that she is to have the ureteral stent changed in 3 months.  She saw Dr [name] in consultation on 11-05-14, with cervix grossly normal appearance, firm to palpation with right anterior extension, and purulent material from cervical os with manipulation, no palpable adnexal masses, induration R>L parametrium. PET 08-02-83 had hypermetabolic tissue thickening at cecum, + small  right sided mesenteric and inguinal nodes, foci in myometriium and in region of cervix, small right perinephric fluid collection and  dilated fluid filled endometrium. Dr [name] saw her following the PET, with recommendation for systemic treatment of the metastatic disease with CDDP, taxol and avastin. First CDDP Taxol 11-19-14, neutropenic day 14. Repeat CT showed essentially stable, minimal right perinephric fluid on 12-06-14, prior to cycle 2 CDDP taxol 7-14 with first avastin 12-10-14 and neulasta.   Review of systems as above, also: No fever. No increase in peripheral neuropathy.No flank pain.Voiding without difficulty. Energy ok now. Denies increased SOB. Remainder of 10 point Review of Systems negative.  Objective:  Vital signs in last 24 hours:  BP 137/80 mmHg  Pulse 88  Temp(Src) 98.1 F (36.7 C) (Oral)  Resp 18  Ht 5' 4"  (1.626 m)  Wt 155 lb 11.2 oz (70.625 kg)  BMI 26.71 kg/m2  SpO2 100%  LMP 11/05/2011 Weight down 3 lbs Alert, oriented and appropriate. Ambulatory without difficulty, looks comfortable.  HEENT:PERRL, sclerae not icteric. Oral mucosa moist without lesions, posterior pharynx clear.  Neck supple. No JVD.  Lymphatics:no cervical,supraclavicular adenopathy Resp: clear to auscultation bilaterally and normal percussion bilaterally Cardio: regular rate and rhythm. No gallop. GI: soft, nontender, not distended, no mass or organomegaly. Normally active bowel sounds.  Musculoskeletal/ Extremities: without pitting edema, cords, tenderness Neuro: no significant peripheral neuropathy. Otherwise nonfocal. Psych appropriate mood and affect Skin without rash, ecchymosis, petechiae   Lab Results:  Results for orders placed or performed in visit on 12/27/14  CBC with Differential  Result Value Ref Range   WBC 6.0 3.9 - 10.3 10e3/uL   NEUT# 3.9 1.5 - 6.5 10e3/uL   HGB 10.2 (L) 11.6 - 15.9 g/dL   HCT 30.7 (L) 34.8 - 46.6 %   Platelets 422 (H) 145 - 400 10e3/uL   MCV 95.9 79.5 - 101.0 fL   MCH 32.0 25.1 - 34.0 pg   MCHC 33.3 31.5 - 36.0 g/dL   RBC 3.21 (L) 3.70 - 5.45 10e6/uL   RDW 23.8 (H) 11.2 -  14.5 %   lymph# 0.9 0.9 - 3.3 10e3/uL   MONO# 0.7 0.1 - 0.9 10e3/uL   Eosinophils Absolute 0.4 0.0 - 0.5 10e3/uL   Basophils Absolute 0.1 0.0 - 0.1 10e3/uL   NEUT% 65.7 38.4 - 76.8 %   LYMPH% 14.4 14.0 - 49.7 %   MONO% 12.1 0.0 - 14.0 %   EOS% 6.8 0.0 - 7.0 %   BASO% 1.0 0.0 - 2.0 %  Comprehensive metabolic panel (Cmet) - [facility]  Result Value Ref Range   Sodium 139 136 - 145 mEq/L   Potassium 4.1 3.5 - 5.1 mEq/L   Chloride 105 98 - 109 mEq/L   CO2 28 22 - 29 mEq/L   Glucose 99 70 - 140 mg/dl   BUN 16.5 7.0 - 26.0 mg/dL   Creatinine 0.9 0.6 - 1.1 mg/dL   Total Bilirubin <0.20 0.20 - 1.20 mg/dL   Alkaline Phosphatase 68 40 - 150 U/L   AST 10 5 - 34 U/L   ALT 8 0 - 55 U/L   Total Protein 6.7 6.4 - 8.3 g/dL   Albumin 3.1 (L) 3.5 - 5.0 g/dL   Calcium 9.3 8.4 - 10.4 mg/dL   Anion Gap 6 3 - 11 mEq/L   EGFR 72 (L) >90 ml/min/1.73 m2  Urine protein by dipstick - [facility]  Result Value Ref Range   Protein, ur 100 Negative- <30 mg/dL  Magnesium  Result Value Ref Range   Magnesium 2.1 1.5 - 2.5 mg/dl     Studies/Results: : CT ABDOMEN WITHOUT CONTRAST  12-06-14  COMPARISON: PET 11/04/2014 and CT abdomen 10/16/2014.  FINDINGS: Lower chest: Lung bases show no acute findings. There is decreased attenuation of the intravascular compartment, indicative of anemia. Heart size normal. No pericardial or pleural effusion.  Hepatobiliary: Liver is unremarkable. Cholecystectomy. No biliary ductal dilatation.  Pancreas: Pancreatic head appears mildly heterogeneous, stable. Otherwise, negative. No ductal dilatation.  Spleen: Negative.  Adrenals/Urinary Tract: Adrenal glands are unremarkable. Perinephric fluid collection along the upper pole right kidney measures 10 mm in thickness (series 2, image 31), stable to slightly increased after percutaneous drain removal. A second perinephric collection of fluid is seen along the lateral aspect of the lower pole right kidney, measuring 5 mm  (series 2, image 37), previously 8 mm. Double-J right ureteral stent is seen with the proximal loop in the renal pelvis and mild residual hydronephrosis. Left kidney is unremarkable.  Stomach/Bowel: Stomach and visualized portions of the small bowel and colon are unremarkable.  Vascular/Lymphatic: Vascular structures are unremarkable. No pathologically enlarged lymph nodes. Ileocolic mesenteric lymph nodes are sub cm in size. Scattered surgical clips.  Other: No free fluid. Mesenteries and peritoneum are otherwise unremarkable.  Musculoskeletal: No worrisome lytic or sclerotic lesions. Degenerative changes are seen in the spine. Prominent Schmorl's node along superior endplate of L2.  IMPRESSION: 1. Stable to minimally increased right perinephric fluid collection after percutaneous drain removal. 2. Right double-J ureteral stent in place with mild residual hydronephrosis.  Medications: I have reviewed the patient's current medications.   DISCUSSION Patient is doing well today. Counts reviewed with patient and family member. Ok for patient to go on vacation as planned. Has appt with [name] on 8/15, but patient states that she forgot that was the day that she need to bring her daughter back to college. Will discuss with [name] an alternative date that she can be seen.   Assessment/Plan:   1.recurrent/ progressive adenocarcinoma of cervix: clinical IIB at diagnosis 04-2014 treated initially with radiation and sensitizing cisplatinum, now with metastatic disease to pelvis and retroperitoneum, with right hydronephrosis. Systemic treatment is in attempt to control disease, with CDDP, taxol and avastin. Neutropenic day 14 cycle 1. Cycle 2 CDDP taxol 7-14 and first avastin 7-15, with neulasta. Cycle 3 will be delayed to allow her beach trip. 2. Urosepsis 09-2014 related to right hydronephrosis and right perinephric fluid collection. Percutaneous drainage of right perinephric  fluid collection and right ureteral stent placed. Noncontrast CT stable right perinephric area prior to adding avastin on 12-10-14.Marland Kitchen 3.C difficile colitis also 09-2014. Diarrhea earlier this week, not reported until today and better now. NEEDS STOOL FOR C DIFF IF ANY FURTHER DIARRHEA. 4.Multifactorial anemia, transfused earlier in course: start ferrous fumarate and follow 5. Intolerance to morphine and codeine causing nausea, has never tried dilaudid 6.weight loss 47 lbs from Feb to May 2016. Some improvement in po intake including supplements 7.constipation, hemorrhoidal discomfort: improved  8.intermittent asthma, none x months 9. 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 12.last mammograms 11-2013, post cholecystectomy   Patient and family member seem to understand and to be in agreement with instructions and plans. Time spent 20 min including >50% counseling and coordination of care.  Cc Dr Raliegh Ip.Brigitte Pulse and Dr Zettie Pho, NP  12/27/2014, 8:15 PM

## 2014-12-28 ENCOUNTER — Other Ambulatory Visit: Payer: Self-pay | Admitting: Oncology

## 2015-01-05 ENCOUNTER — Telehealth: Payer: Self-pay | Admitting: Oncology

## 2015-01-05 NOTE — Telephone Encounter (Signed)
Spoke with patient and we have altered her appointments per dr Marko Plume staff message  Webb [facility]versmith

## 2015-01-10 ENCOUNTER — Other Ambulatory Visit: Payer: BC Managed Care – PPO

## 2015-01-10 ENCOUNTER — Ambulatory Visit: Payer: BC Managed Care – PPO | Admitting: Oncology

## 2015-01-11 ENCOUNTER — Telehealth: Payer: Self-pay

## 2015-01-11 ENCOUNTER — Other Ambulatory Visit (HOSPITAL_BASED_OUTPATIENT_CLINIC_OR_DEPARTMENT_OTHER): Payer: BC Managed Care – PPO

## 2015-01-11 DIAGNOSIS — C530 Malignant neoplasm of endocervix: Secondary | ICD-10-CM | POA: Diagnosis not present

## 2015-01-11 DIAGNOSIS — C539 Malignant neoplasm of cervix uteri, unspecified: Secondary | ICD-10-CM

## 2015-01-11 DIAGNOSIS — C7989 Secondary malignant neoplasm of other specified sites: Secondary | ICD-10-CM

## 2015-01-11 LAB — COMPREHENSIVE METABOLIC PANEL (CC13)
ALT: 10 U/L (ref 0–55)
AST: 11 U/L (ref 5–34)
Albumin: 3.1 g/dL — ABNORMAL LOW (ref 3.5–5.0)
Alkaline Phosphatase: 69 U/L (ref 40–150)
Anion Gap: 9 mEq/L (ref 3–11)
BUN: 11.1 mg/dL (ref 7.0–26.0)
CALCIUM: 9.5 mg/dL (ref 8.4–10.4)
CHLORIDE: 105 meq/L (ref 98–109)
CO2: 27 mEq/L (ref 22–29)
CREATININE: 1 mg/dL (ref 0.6–1.1)
EGFR: 61 mL/min/{1.73_m2} — ABNORMAL LOW (ref >90)
GLUCOSE: 115 mg/dL (ref 70–140)
Potassium: 4 mEq/L (ref 3.5–5.1)
Sodium: 141 mEq/L (ref 136–145)
Total Bilirubin: <0.2 mg/dL (ref 0.20–1.20)
Total Protein: 6.8 g/dL (ref 6.4–8.3)

## 2015-01-11 LAB — CBC WITH DIFFERENTIAL/PLATELET
BASO%: 0.9 % (ref 0.0–2.0)
Basophils Absolute: 0.1 10*3/uL (ref 0.0–0.1)
EOS%: 27.7 % — ABNORMAL HIGH (ref 0.0–7.0)
Eosinophils Absolute: 2.2 10*3/uL — ABNORMAL HIGH (ref 0.0–0.5)
HEMATOCRIT: 32.8 % — AB (ref 34.8–46.6)
HGB: 11.1 g/dL — ABNORMAL LOW (ref 11.6–15.9)
LYMPH#: 0.8 10*3/uL — AB (ref 0.9–3.3)
LYMPH%: 10.4 % — ABNORMAL LOW (ref 14.0–49.7)
MCH: 33.6 pg (ref 25.1–34.0)
MCHC: 33.8 g/dL (ref 31.5–36.0)
MCV: 99.6 fL (ref 79.5–101.0)
MONO#: 0.9 10*3/uL (ref 0.1–0.9)
MONO%: 11.1 % (ref 0.0–14.0)
NEUT#: 3.9 10*3/uL (ref 1.5–6.5)
NEUT%: 49.9 % (ref 38.4–76.8)
Platelets: 335 10*3/uL (ref 145–400)
RBC: 3.29 10*6/uL — ABNORMAL LOW (ref 3.70–5.45)
RDW: 21.1 % — AB (ref 11.2–14.5)
WBC: 7.8 10*3/uL (ref 3.9–10.3)

## 2015-01-11 LAB — MAGNESIUM (CC13): Magnesium: 1.6 mg/dl (ref 1.5–2.5)

## 2015-01-11 NOTE — Telephone Encounter (Signed)
-----   Message from Gordy Levan, MD sent at 01/11/2015  6:02 PM EDT ----- Labs seen and need follow up: please let her know labs ok to treat 8-18. Keep MD apt that AM as scheduled

## 2015-01-11 NOTE — Telephone Encounter (Signed)
Told Stacy Webster the results of labs and follow up visit as noted below by Dr.Livesay.

## 2015-01-12 ENCOUNTER — Other Ambulatory Visit: Payer: Self-pay | Admitting: Oncology

## 2015-01-12 DIAGNOSIS — C7989 Secondary malignant neoplasm of other specified sites: Secondary | ICD-10-CM

## 2015-01-13 ENCOUNTER — Ambulatory Visit (HOSPITAL_BASED_OUTPATIENT_CLINIC_OR_DEPARTMENT_OTHER): Payer: BC Managed Care – PPO | Admitting: Oncology

## 2015-01-13 ENCOUNTER — Encounter: Payer: Self-pay | Admitting: Oncology

## 2015-01-13 ENCOUNTER — Other Ambulatory Visit: Payer: BC Managed Care – PPO

## 2015-01-13 ENCOUNTER — Ambulatory Visit (HOSPITAL_BASED_OUTPATIENT_CLINIC_OR_DEPARTMENT_OTHER): Payer: BC Managed Care – PPO

## 2015-01-13 ENCOUNTER — Telehealth: Payer: Self-pay | Admitting: Oncology

## 2015-01-13 VITALS — BP 140/79 | HR 84 | Temp 98.2°F | Resp 18 | Ht 64.0 in | Wt 156.3 lb

## 2015-01-13 DIAGNOSIS — G622 Polyneuropathy due to other toxic agents: Secondary | ICD-10-CM | POA: Diagnosis not present

## 2015-01-13 DIAGNOSIS — N1330 Unspecified hydronephrosis: Secondary | ICD-10-CM

## 2015-01-13 DIAGNOSIS — C7989 Secondary malignant neoplasm of other specified sites: Secondary | ICD-10-CM

## 2015-01-13 DIAGNOSIS — C530 Malignant neoplasm of endocervix: Secondary | ICD-10-CM | POA: Diagnosis not present

## 2015-01-13 DIAGNOSIS — T451X5A Adverse effect of antineoplastic and immunosuppressive drugs, initial encounter: Secondary | ICD-10-CM

## 2015-01-13 DIAGNOSIS — C786 Secondary malignant neoplasm of retroperitoneum and peritoneum: Secondary | ICD-10-CM

## 2015-01-13 DIAGNOSIS — G620 Drug-induced polyneuropathy: Secondary | ICD-10-CM

## 2015-01-13 DIAGNOSIS — Z5111 Encounter for antineoplastic chemotherapy: Secondary | ICD-10-CM | POA: Diagnosis not present

## 2015-01-13 DIAGNOSIS — D500 Iron deficiency anemia secondary to blood loss (chronic): Secondary | ICD-10-CM

## 2015-01-13 DIAGNOSIS — C539 Malignant neoplasm of cervix uteri, unspecified: Secondary | ICD-10-CM

## 2015-01-13 DIAGNOSIS — D701 Agranulocytosis secondary to cancer chemotherapy: Secondary | ICD-10-CM

## 2015-01-13 MED ORDER — DIPHENHYDRAMINE HCL 50 MG/ML IJ SOLN
INTRAMUSCULAR | Status: AC
  Filled 2015-01-13: qty 1

## 2015-01-13 MED ORDER — SODIUM CHLORIDE 0.9 % IV SOLN
Freq: Once | INTRAVENOUS | Status: AC
  Administered 2015-01-13: 11:00:00 via INTRAVENOUS
  Filled 2015-01-13: qty 5

## 2015-01-13 MED ORDER — SODIUM CHLORIDE 0.9 % IV SOLN
Freq: Once | INTRAVENOUS | Status: AC
  Administered 2015-01-13: 09:00:00 via INTRAVENOUS

## 2015-01-13 MED ORDER — FAMOTIDINE IN NACL 20-0.9 MG/50ML-% IV SOLN
20.0000 mg | Freq: Once | INTRAVENOUS | Status: AC
  Administered 2015-01-13: 20 mg via INTRAVENOUS

## 2015-01-13 MED ORDER — DIPHENHYDRAMINE HCL 50 MG/ML IJ SOLN
50.0000 mg | Freq: Once | INTRAMUSCULAR | Status: AC
  Administered 2015-01-13: 50 mg via INTRAVENOUS

## 2015-01-13 MED ORDER — POTASSIUM CHLORIDE 2 MEQ/ML IV SOLN
INTRAVENOUS | Status: DC
Start: 2015-01-13 — End: 2015-01-13
  Administered 2015-01-13: 10:00:00 via INTRAVENOUS
  Filled 2015-01-13: qty 1000

## 2015-01-13 MED ORDER — PACLITAXEL CHEMO INJECTION 300 MG/50ML
135.0000 mg/m2 | Freq: Once | INTRAVENOUS | Status: AC
  Administered 2015-01-13: 234 mg via INTRAVENOUS
  Filled 2015-01-13: qty 39

## 2015-01-13 MED ORDER — SODIUM CHLORIDE 0.9 % IV SOLN
40.0000 mg/m2 | Freq: Once | INTRAVENOUS | Status: AC
  Administered 2015-01-13: 70 mg via INTRAVENOUS
  Filled 2015-01-13: qty 70

## 2015-01-13 MED ORDER — DEXAMETHASONE 4 MG PO TABS: ORAL_TABLET | ORAL | Status: DC

## 2015-01-13 MED ORDER — FAMOTIDINE IN NACL 20-0.9 MG/50ML-% IV SOLN
INTRAVENOUS | Status: AC
  Filled 2015-01-13: qty 50

## 2015-01-13 MED ORDER — LORAZEPAM 2 MG/ML IJ SOLN
0.5000 mg | Freq: Once | INTRAMUSCULAR | Status: AC
  Administered 2015-01-13: 0.5 mg via INTRAVENOUS

## 2015-01-13 MED ORDER — SODIUM CHLORIDE 0.9 % IJ SOLN
10.0000 mL | INTRAMUSCULAR | Status: DC | PRN
  Filled 2015-01-13: qty 10

## 2015-01-13 MED ORDER — SODIUM CHLORIDE 0.9 % IV SOLN
Freq: Once | INTRAVENOUS | Status: AC
  Administered 2015-01-13: 12:00:00 via INTRAVENOUS
  Filled 2015-01-13: qty 8

## 2015-01-13 MED ORDER — LORAZEPAM 2 MG/ML IJ SOLN
INTRAMUSCULAR | Status: AC
Start: 2015-01-13 — End: 2015-01-13
  Filled 2015-01-13: qty 1

## 2015-01-13 MED ORDER — HEPARIN SOD (PORK) LOCK FLUSH 100 UNIT/ML IV SOLN
500.0000 [IU] | Freq: Once | INTRAVENOUS | Status: DC | PRN
  Filled 2015-01-13: qty 5

## 2015-01-13 NOTE — Progress Notes (Signed)
OFFICE PROGRESS NOTE   January 13, 2015   Physicians:Emma Carita Pian, MD, Michel Bickers, Leonidas Romberg, Genia Del, Herma Ard  INTERVAL HISTORY:   Patient is seen, together with sister in law, in continuing atttention to metastatic adenocarcinoma of cervix involving pelvis, for which she is receiving CDDP/taxol/avastin, due cycle 3 chemo + cycle 2 avastin this week. This treatment was delayed a week to allow planned family beach vacation, which she very much enjoyed. She took daughter to start sophomore year at [employer] 2 days after returning from [place]. She missed appointment with Dr Junious Silk, I believe due to chemo scheduling, with next available per his office in October. She had right JJ stent placed 10-13-14, recalls that this was to be changed in 3 months.   With cycle 2 she had aches days 4-5-6, worst on day 5, some improvement with claritin and prn ibuprofen. Patient has felt better overall this week, with good appetite and energy, bowels moving daily with colace daily. She has some fairly minimal peripheral neuropathy fingertips and metatarsal heads to toes bilaterally, not uncomfortable and not interfering with function. Some discomfort RLQ  In past few days, possibly related to 2 long car trips, some better today. No bleeding, no SOB, no fluid retention.    No PAC Right JJ stent placed 10-13-14.  ONCOLOGIC HISTORY Patient had been in usual good health, with no previous abnormal PAP smears, when she developed frequent urination and vaginal bleeding in late 2015. She saw PCP Dr Brigitte Pulse, then had endometrial biopsy by Dr Janyth Pupa [phone]) with adenocarcinoma. PET in [facility] system 1-97-58 had hypermetabolic uptake within the uterine cervix and lower uterine segment and metastatic adenopathy within the bilateral pelvis and periaortic retroperitoneum. Dr Genia Del and Dr Marguerita Merles treated with pelvic and para-aortic radiation given  with 6 cycles of sensitizing CDDP, and HDR x5 from 07-06-14 thru 09-03-14. Patient had a difficult time with nausea, vomiting, diarrhea and hypokalemia/ hypomagnesemia thru treatment. Patient used ODT zofran and ativan during chemotherapy. She had a difficult time doing oral prehydration for CDDP. She had no peripheral neuropathy with the CDDP. Chemotherapy information is not available at time of this consultation and will be requested; patient does not know if she received EMEND with CDDP. There was concern by completion of the treatment that she did not have optimal response, with plan for repeat evaluation at 6 weeks from completion of the treatment. Prior to planned reevaluation, patient began having daily fevers for possibly 4 weeks, as high as 102 degrees x 2 weeks prior to 10-11-14; by presentation at ED she also had increased right flank pain. She had CT at [facility] on 10-08-14, this compared with PET of 05-2014 showed increase in subcapsular fluid collection right kidney and right hydronephrosis, increase in size of endometrial canal/ uterus/ right adnexa, and improvement in retroperitoneal, common iliac and bilateral pelvic sidewall adenopathy. Symptoms worsened and she was admitted thru [facility] ED to ICU on 5-16/ 10-12-14 with sepsis from peptostreptococcus. She had percutaneous drainage by IR of 635 cc dark thin fluid from right perinephric area on 10-12-14, with perinephric drain removed by urology on 10-18-14. She had right ureteral stent placement by Dr Junious Silk on 10-13-14. She developed C.difficile diarrhea, begun on flagyl 10-15-14 x 2 weeks, also on ceftin during that time. Patient understands that she is to have the ureteral stent changed in 3 months.  She saw Dr [name] in consultation on 11-05-14, with cervix grossly normal appearance, firm  to palpation with right anterior extension, and purulent material from cervical os with manipulation, no palpable adnexal masses, induration  R>L parametrium. PET 7-0-26 had hypermetabolic tissue thickening at cecum, + small right sided mesenteric and inguinal nodes, foci in myometriium and in region of cervix, small right perinephric fluid collection and dilated fluid filled endometrium. Dr [name] saw her following the PET, with recommendation for systemic treatment of the metastatic disease with CDDP, taxol and avastin. First CDDP Taxol 11-19-14, neutropenic day 14. Repeat CT showed essentially stable, minimal right perinephric fluid on 12-06-14, prior to cycle 2 CDDP taxol 7-14 with first avastin 12-10-14 and neulasta.    Review of systems as above, also: Bladder ok. No SOB. Feet better in athletic shoes than sandals. No LE swelling. No fever, no right flank pain. Remainder of 10 point Review of Systems negative.  Objective:  Vital signs in last 24 hours:  BP 140/79 mmHg  Pulse 84  Temp(Src) 98.2 F (36.8 C) (Oral)  Resp 18  Ht 5' 4"  (1.626 m)  Wt 156 lb 4.8 oz (70.897 kg)  BMI 26.82 kg/m2  SpO2 100%  LMP 11/05/2011 Weight up 1 lb Alert, oriented and appropriate. Ambulatory without difficulty. Appears well hydrated today. Alopecia. Tanned in sun exposed areas. Respirations not labored, looks comfortable  HEENT:PERRL, sclerae not icteric. Oral mucosa moist without lesions, posterior pharynx clear.  Neck supple. No JVD.  Lymphatics:no cervical,supraclavicular or inguinal adenopathy Resp: clear to auscultation bilaterally  Cardio: regular rate and rhythm. No gallop. GI: soft, nontender, not distended, no mass or organomegaly. Normally active bowel sounds. Right flank not tender. Musculoskeletal/ Extremities: without pitting edema, cords, tenderness Neuro: distal peripheral neuropathy feet > hands as noted. Otherwise nonfocal Skin without rash, ecchymosis, petechiae   Lab Results: Labs done 01-11-15 to allow scheduling of early MD visit prior to long chemo today. Results for orders placed or performed in visit on 01/11/15   CBC with Differential  Result Value Ref Range   WBC 7.8 3.9 - 10.3 10e3/uL   NEUT# 3.9 1.5 - 6.5 10e3/uL   HGB 11.1 (L) 11.6 - 15.9 g/dL   HCT 32.8 (L) 34.8 - 46.6 %   Platelets 335 145 - 400 10e3/uL   MCV 99.6 79.5 - 101.0 fL   MCH 33.6 25.1 - 34.0 pg   MCHC 33.8 31.5 - 36.0 g/dL   RBC 3.29 (L) 3.70 - 5.45 10e6/uL   RDW 21.1 (H) 11.2 - 14.5 %   lymph# 0.8 (L) 0.9 - 3.3 10e3/uL   MONO# 0.9 0.1 - 0.9 10e3/uL   Eosinophils Absolute 2.2 (H) 0.0 - 0.5 10e3/uL   Basophils Absolute 0.1 0.0 - 0.1 10e3/uL   NEUT% 49.9 38.4 - 76.8 %   LYMPH% 10.4 (L) 14.0 - 49.7 %   MONO% 11.1 0.0 - 14.0 %   EOS% 27.7 (H) 0.0 - 7.0 %   BASO% 0.9 0.0 - 2.0 %  Comprehensive metabolic panel (Cmet) - [facility]  Result Value Ref Range   Sodium 141 136 - 145 mEq/L   Potassium 4.0 3.5 - 5.1 mEq/L   Chloride 105 98 - 109 mEq/L   CO2 27 22 - 29 mEq/L   Glucose 115 70 - 140 mg/dl   BUN 11.1 7.0 - 26.0 mg/dL   Creatinine 1.0 0.6 - 1.1 mg/dL   Total Bilirubin <0.20 0.20 - 1.20 mg/dL   Alkaline Phosphatase 69 40 - 150 U/L   AST 11 5 - 34 U/L   ALT 10 0 - 55  U/L   Total Protein 6.8 6.4 - 8.3 g/dL   Albumin 3.1 (L) 3.5 - 5.0 g/dL   Calcium 9.5 8.4 - 10.4 mg/dL   Anion Gap 9 3 - 11 mEq/L   EGFR 61 (L) >90 ml/min/1.73 m2  Magnesium  Result Value Ref Range   Magnesium 1.6 1.5 - 2.5 mg/dl     Studies/Results:  No results found.  Medications: I have reviewed the patient's current medications. She will begin magnesium oxide 400 mg daily, which she has available at home from prior chemo  DISCUSSION: Recommended that patient call back to Dr Lyndal Rainbow office to schedule sooner if possible, as RLQ symptoms may be related to stent change due, likely also reflecting 2 recent long car trips with positioning, change in fluid intake and voiding schedule with those.   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 Discussed trying to stay on schedule with treatments from here.  Assessment/Plan:  1.recurrent/ progressive adenocarcinoma of cervix: clinical IIB at diagnosis 04-2014 treated initially with radiation and sensitizing cisplatinum, now with metastatic disease to pelvis and retroperitoneum, with right hydronephrosis. Systemic treatment is in attempt to control disease. Day 1 cycle 3 today, neulasta each cycle, adjust dates as possible to accommodate her work schedule. 2. Urosepsis 09-2014 related to right hydronephrosis and right perinephric fluid collection. Percutaneous drainage of right perinephric fluid collection and right ureteral stent placed. Noncontrast CT stable right perinephric area prior to adding avastin on 12-10-14. Reportedly due stent change ~ now, some symptoms may be stent related, needs appointment with urology sooner than rescheduled if possible.  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  4.C difficile colitis also 09-2014. NEEDS STOOL FOR C DIFF IF ANY FURTHER DIARRHEA. 5Multifactorial anemia, transfused earlier in course: start ferrous fumarate and follow 6.weight loss 47 lbs from Feb to May 2016. Some improvement in po intake including supplements 7.constipation, hemorrhoidal discomfort: improved. PO magmesium may help also 8.intermittent asthma, none x months 9. Mild orthostatic symptoms resolved with better po hydration 10.hypokalemia likely from CDDP: add oral K and follow, supplement with IVs with  treatments. 11.magnesium dropping also related to CDDP: continue IV supplements, add po magnesium, follow 12.last mammograms 11-2013, post cholecystectomy 13. Intolerance to morphine and codeine causing nausea, has never tried dilaudid  Chemo, avastin, IVF, neulasta orders confirmed. All questions answered and she knows to call between scheduled visits if needed. Should repeat scans after cycle 3 or 4 and needs to see Dr [name] for reevaluation after those. Time spent 30 min including >50% counseling and coordination of care. CC Dr Junious Silk and Dr Corene Cornea, MD   01/13/2015, 8:15 AM

## 2015-01-13 NOTE — Progress Notes (Signed)
OK to run hydration fluids at 500 ml/hr when Cisplatin complete per [name].

## 2015-01-13 NOTE — Patient Instructions (Signed)
[facility] Discharge Instructions for Patients Receiving Chemotherapy  Today you received the following chemotherapy agents Taxol, Cisplatin.  To help prevent nausea and vomiting after your treatment, we encourage you to take your nausea medication: Ativan. Take one every 6 hours as needed. Take one tonight at bedtime tonight. For any nausea on 6/25 take Ativan. You will receive IV Zofran in the office. Zofran may be taken every 8 hours as needed.    If you develop nausea and vomiting that is not controlled by your nausea medication, call the clinic.   BELOW ARE SYMPTOMS THAT SHOULD BE REPORTED IMMEDIATELY:  *FEVER GREATER THAN 100.5 F  *CHILLS WITH OR WITHOUT FEVER  NAUSEA AND VOMITING THAT IS NOT CONTROLLED WITH YOUR NAUSEA MEDICATION  *UNUSUAL SHORTNESS OF BREATH  *UNUSUAL BRUISING OR BLEEDING  TENDERNESS IN MOUTH AND THROAT WITH OR WITHOUT PRESENCE OF ULCERS  *URINARY PROBLEMS  *BOWEL PROBLEMS  UNUSUAL RASH Items with * indicate a potential emergency and should be followed up as soon as possible.  Feel free to call the clinic should you have any questions or concerns. The clinic phone number is [phone].  Please show the [facility] at check-in to the Emergency Department and triage nurse.

## 2015-01-13 NOTE — Telephone Encounter (Signed)
Appointments made and avs printed for patient/contrast given

## 2015-01-14 ENCOUNTER — Telehealth: Payer: Self-pay | Admitting: Oncology

## 2015-01-14 ENCOUNTER — Other Ambulatory Visit: Payer: Self-pay | Admitting: Oncology

## 2015-01-14 ENCOUNTER — Ambulatory Visit (HOSPITAL_BASED_OUTPATIENT_CLINIC_OR_DEPARTMENT_OTHER): Payer: BC Managed Care – PPO

## 2015-01-14 VITALS — BP 135/70 | HR 66 | Temp 97.9°F | Resp 18

## 2015-01-14 DIAGNOSIS — C7989 Secondary malignant neoplasm of other specified sites: Secondary | ICD-10-CM

## 2015-01-14 DIAGNOSIS — Z5189 Encounter for other specified aftercare: Secondary | ICD-10-CM

## 2015-01-14 DIAGNOSIS — C539 Malignant neoplasm of cervix uteri, unspecified: Secondary | ICD-10-CM

## 2015-01-14 DIAGNOSIS — Z5112 Encounter for antineoplastic immunotherapy: Secondary | ICD-10-CM | POA: Diagnosis not present

## 2015-01-14 DIAGNOSIS — C530 Malignant neoplasm of endocervix: Secondary | ICD-10-CM | POA: Diagnosis not present

## 2015-01-14 MED ORDER — POTASSIUM CHLORIDE 2 MEQ/ML IV SOLN: INTRAVENOUS | Status: DC

## 2015-01-14 MED ORDER — BEVACIZUMAB CHEMO INJECTION 400 MG/16ML
14.8000 mg/kg | Freq: Once | INTRAVENOUS | Status: AC
  Administered 2015-01-14: 1000 mg via INTRAVENOUS
  Filled 2015-01-14: qty 40

## 2015-01-14 MED ORDER — PEGFILGRASTIM 6 MG/0.6ML ~~LOC~~ PSKT
6.0000 mg | PREFILLED_SYRINGE | Freq: Once | SUBCUTANEOUS | Status: AC
  Administered 2015-01-14: 6 mg via SUBCUTANEOUS
  Filled 2015-01-14: qty 0.6

## 2015-01-14 MED ORDER — SODIUM CHLORIDE 0.9 % IV SOLN
Freq: Once | INTRAVENOUS | Status: AC
  Administered 2015-01-14: 11:00:00 via INTRAVENOUS
  Filled 2015-01-14: qty 1000

## 2015-01-14 MED ORDER — SODIUM CHLORIDE 0.9 % IV SOLN: 12.0000 g | Freq: Once | INTRAVENOUS | Status: DC

## 2015-01-14 MED ORDER — PEGFILGRASTIM INJECTION 6 MG/0.6ML ~~LOC~~: 6.0000 mg | PREFILLED_SYRINGE | Freq: Once | SUBCUTANEOUS | Status: DC

## 2015-01-14 MED ORDER — SODIUM CHLORIDE 0.9 % IV SOLN
Freq: Once | INTRAVENOUS | Status: AC
  Administered 2015-01-14: 10:00:00 via INTRAVENOUS

## 2015-01-14 NOTE — Patient Instructions (Signed)
[facility] Discharge Instructions for Patients Receiving Chemotherapy  Today you received the following chemotherapy agents Avastin   To help prevent nausea and vomiting after your treatment, we encourage you to take your nausea medication as directed.    If you develop nausea and vomiting that is not controlled by your nausea medication, call the clinic.   BELOW ARE SYMPTOMS THAT SHOULD BE REPORTED IMMEDIATELY:  *FEVER GREATER THAN 100.5 F  *CHILLS WITH OR WITHOUT FEVER  NAUSEA AND VOMITING THAT IS NOT CONTROLLED WITH YOUR NAUSEA MEDICATION  *UNUSUAL SHORTNESS OF BREATH  *UNUSUAL BRUISING OR BLEEDING  TENDERNESS IN MOUTH AND THROAT WITH OR WITHOUT PRESENCE OF ULCERS  *URINARY PROBLEMS  *BOWEL PROBLEMS  UNUSUAL RASH Items with * indicate a potential emergency and should be followed up as soon as possible.  Feel free to call the clinic you have any questions or concerns. The clinic phone number is [phone].  Please show the CHEMO ALERT CARD at check-in to the Emergency Department and triage nurse.   

## 2015-01-14 NOTE — Telephone Encounter (Signed)
Medical Oncology  MD spoke with [facility] NP, who will let their triage RN know that patient needs appointment in near future re stent. That office will be in touch with patient.  Godfrey Pick, MD

## 2015-01-16 ENCOUNTER — Other Ambulatory Visit: Payer: Self-pay | Admitting: Oncology

## 2015-01-16 DIAGNOSIS — C539 Malignant neoplasm of cervix uteri, unspecified: Secondary | ICD-10-CM

## 2015-01-16 DIAGNOSIS — C7989 Secondary malignant neoplasm of other specified sites: Secondary | ICD-10-CM

## 2015-01-18 ENCOUNTER — Other Ambulatory Visit: Payer: Self-pay | Admitting: Urology

## 2015-01-20 ENCOUNTER — Ambulatory Visit (HOSPITAL_BASED_OUTPATIENT_CLINIC_OR_DEPARTMENT_OTHER): Payer: BC Managed Care – PPO | Admitting: Oncology

## 2015-01-20 ENCOUNTER — Encounter: Payer: Self-pay | Admitting: Oncology

## 2015-01-20 ENCOUNTER — Other Ambulatory Visit (HOSPITAL_BASED_OUTPATIENT_CLINIC_OR_DEPARTMENT_OTHER): Payer: BC Managed Care – PPO

## 2015-01-20 VITALS — BP 135/81 | HR 88 | Temp 98.4°F | Resp 18 | Ht 64.0 in | Wt 153.9 lb

## 2015-01-20 DIAGNOSIS — R634 Abnormal weight loss: Secondary | ICD-10-CM

## 2015-01-20 DIAGNOSIS — C7989 Secondary malignant neoplasm of other specified sites: Secondary | ICD-10-CM | POA: Diagnosis not present

## 2015-01-20 DIAGNOSIS — C530 Malignant neoplasm of endocervix: Secondary | ICD-10-CM

## 2015-01-20 DIAGNOSIS — G622 Polyneuropathy due to other toxic agents: Secondary | ICD-10-CM

## 2015-01-20 DIAGNOSIS — D701 Agranulocytosis secondary to cancer chemotherapy: Secondary | ICD-10-CM | POA: Diagnosis not present

## 2015-01-20 DIAGNOSIS — T451X5A Adverse effect of antineoplastic and immunosuppressive drugs, initial encounter: Secondary | ICD-10-CM

## 2015-01-20 DIAGNOSIS — C539 Malignant neoplasm of cervix uteri, unspecified: Secondary | ICD-10-CM

## 2015-01-20 DIAGNOSIS — N1330 Unspecified hydronephrosis: Secondary | ICD-10-CM

## 2015-01-20 DIAGNOSIS — G620 Drug-induced polyneuropathy: Secondary | ICD-10-CM

## 2015-01-20 LAB — COMPREHENSIVE METABOLIC PANEL (CC13)
ALBUMIN: 3.4 g/dL — AB (ref 3.5–5.0)
ALK PHOS: 100 U/L (ref 40–150)
ALT: 14 U/L (ref 0–55)
ANION GAP: 8 meq/L (ref 3–11)
AST: 11 U/L (ref 5–34)
BILIRUBIN TOTAL: 0.2 mg/dL (ref 0.20–1.20)
BUN: 22 mg/dL (ref 7.0–26.0)
CALCIUM: 9 mg/dL (ref 8.4–10.4)
CO2: 25 mEq/L (ref 22–29)
Chloride: 105 mEq/L (ref 98–109)
Creatinine: 1.1 mg/dL (ref 0.6–1.1)
EGFR: 60 mL/min/{1.73_m2} — AB (ref >90)
Glucose: 97 mg/dl (ref 70–140)
Potassium: 3.9 mEq/L (ref 3.5–5.1)
Sodium: 138 mEq/L (ref 136–145)
TOTAL PROTEIN: 6.6 g/dL (ref 6.4–8.3)

## 2015-01-20 LAB — MAGNESIUM (CC13): Magnesium: 1.8 mg/dl (ref 1.5–2.5)

## 2015-01-20 LAB — CBC WITH DIFFERENTIAL/PLATELET
BASO%: 0.5 % (ref 0.0–2.0)
Basophils Absolute: 0 10*3/uL (ref 0.0–0.1)
EOS ABS: 0.9 10*3/uL — AB (ref 0.0–0.5)
EOS%: 12.7 % — ABNORMAL HIGH (ref 0.0–7.0)
HCT: 34.1 % — ABNORMAL LOW (ref 34.8–46.6)
HEMOGLOBIN: 11.4 g/dL — AB (ref 11.6–15.9)
LYMPH%: 10.6 % — AB (ref 14.0–49.7)
MCH: 33.3 pg (ref 25.1–34.0)
MCHC: 33.3 g/dL (ref 31.5–36.0)
MCV: 100.1 fL (ref 79.5–101.0)
MONO#: 0.8 10*3/uL (ref 0.1–0.9)
MONO%: 11.2 % (ref 0.0–14.0)
NEUT%: 65 % (ref 38.4–76.8)
NEUTROS ABS: 4.4 10*3/uL (ref 1.5–6.5)
PLATELETS: 221 10*3/uL (ref 145–400)
RBC: 3.41 10*6/uL — ABNORMAL LOW (ref 3.70–5.45)
RDW: 19.2 % — ABNORMAL HIGH (ref 11.2–14.5)
WBC: 6.7 10*3/uL (ref 3.9–10.3)
lymph#: 0.7 10*3/uL — ABNORMAL LOW (ref 0.9–3.3)

## 2015-01-20 NOTE — Progress Notes (Signed)
OFFICE PROGRESS NOTE   January 20, 2015   Physicians:Emma Carita Pian, MD, Michel Bickers, Leonidas Romberg, Genia Del), Marguerita Merles, Janyth Pupa  INTERVAL HISTORY:   Patient is seen, together with sister in law, in continuing attention to chemotherapy in process for metastatic adenocarcinoma of cervix involving pelvis, having had cycle 3 CDDP taxol and cycle 2 avastin on 8-18 and 01-14-15, with additional IVF and neulasta also on 01-14-15. She will have repeat scans and see Dr [name] after next cycle. She is scheduled for right ureteral stent change on 02-18-15, by Dr Junious Silk.  Patient had more nausea following most recent chemo, and a few episodes of abrupt vomiting on day 4. She has kept down fluids and is not nauseated today. Aches were not as severe, mostly in arms this time. She has minimal numbness in tips of fingers and more noticeable in distal feet, tho not bothersome and not interfering with activity. Bowels are moving ~ 2x daily with slightly loose stools. She has had no bleeding. She denies pain.   No PAC Right JJ stent placed 10-13-14.  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 Appointments need to be late afternoon when possible and otherwise best after 1130 (counts as full day if at work until [employer]). I have requested time of Dr Serita Grit appt on 9-29 adjusted as above. For CT, she prefers AM apt.   ONCOLOGIC HISTORY Patient had been in usual good health, with no previous abnormal PAP smears, when she developed frequent urination and vaginal bleeding in late 2015. She saw PCP Dr Brigitte Pulse, then had endometrial biopsy by Dr Janyth Pupa [phone]) with adenocarcinoma. PET in [place] system 12-01-84 had hypermetabolic uptake within the uterine cervix and lower uterine segment and metastatic adenopathy within the bilateral pelvis and periaortic retroperitoneum. Dr Genia Del and Dr Marguerita Merles treated with pelvic and para-aortic radiation given with 6  cycles of sensitizing CDDP, and HDR x5 from 07-06-14 thru 09-03-14. Patient had a difficult time with nausea, vomiting, diarrhea and hypokalemia/ hypomagnesemia thru treatment. Patient used ODT zofran and ativan during chemotherapy. She had a difficult time doing oral prehydration for CDDP. She had no peripheral neuropathy with the CDDP. Chemotherapy information is not available at time of this consultation and will be requested; patient does not know if she received EMEND with CDDP. There was concern by completion of the treatment that she did not have optimal response, with plan for repeat evaluation at 6 weeks from completion of the treatment. Prior to planned reevaluation, patient began having daily fevers for possibly 4 weeks, as high as 102 degrees x 2 weeks prior to 10-11-14; by presentation at ED she also had increased right flank pain. She had CT at [facility] on 10-08-14, this compared with PET of 05-2014 showed increase in subcapsular fluid collection right kidney and right hydronephrosis, increase in size of endometrial canal/ uterus/ right adnexa, and improvement in retroperitoneal, common iliac and bilateral pelvic sidewall adenopathy. Symptoms worsened and she was admitted thru [facility] ED to ICU on 5-16/ 10-12-14 with sepsis from peptostreptococcus. She had percutaneous drainage by IR of 635 cc dark thin fluid from right perinephric area on 10-12-14, with perinephric drain removed by urology on 10-18-14. She had right ureteral stent placement by Dr Junious Silk on 10-13-14. She developed C.difficile diarrhea, begun on flagyl 10-15-14 x 2 weeks, also on ceftin during that time. Patient understands that she is to have the ureteral stent changed in 3 months.  She saw Dr  [name] in consultation on 11-05-14, with cervix grossly normal appearance, firm to palpation with right anterior extension, and purulent material from cervical os with manipulation, no palpable adnexal masses, induration R>L  parametrium. PET 0-9-32 had hypermetabolic tissue thickening at cecum, + small right sided mesenteric and inguinal nodes, foci in myometriium and in region of cervix, small right perinephric fluid collection and dilated fluid filled endometrium. Dr [name] saw her following the PET, with recommendation for systemic treatment of the metastatic disease with CDDP, taxol and avastin. First CDDP Taxol 11-19-14, neutropenic day 14. Repeat CT showed essentially stable, minimal right perinephric fluid on 12-06-14, prior to cycle 2 CDDP taxol 7-14 with first avastin 12-10-14 and neulasta.     Review of systems as above, also: No fever or symptoms of infection. No recurrence of the RLQ/ right flank pain, possibly stent related. No LE swelling. No SOB or other respiratory symptoms. Voiding ok. PAC functioning well. Some environmental allergy symptoms, ok for claritin daily. Remainder of 10 point Review of Systems negative.  Objective:  Vital signs in last 24 hours:  BP 135/81 mmHg  Pulse 88  Temp(Src) 98.4 F (36.9 C) (Oral)  Resp 18  Ht _0  (1.626 m)  Wt 153 lb 14.4 oz (69.809 kg)  BMI 26.40 kg/m2  SpO2 100%  LMP 11/05/2011 Weight down 3 lbs Alert, oriented and appropriate. Ambulatory without difficulty. Looks comfortable, respirations not labored RA. Alopecia  HEENT:PERRL, sclerae not icteric. Oral mucosa moist without lesions, posterior pharynx clear.  Neck supple. No JVD.  Lymphatics:no cervical,supraclavicular, inguinal adenopathy Resp: clear to auscultation bilaterally and normal percussion bilaterally Cardio: regular rate and rhythm. No gallop. GI: soft, nontender including epigastrium, not distended, no mass or organomegaly. Normally active bowel sounds. Surgical incision not remarkable. Musculoskeletal/ Extremities: without pitting edema, cords, tenderness Neuro: mild peripheral neuropathy as noted. Otherwise nonfocal. PSYCH appropriate mood and affect Skin without rash, ecchymosis,  petechiae Portacath-without erythema or tenderness  Lab Results:  Results for orders placed or performed in visit on 01/20/15  CBC with Differential  Result Value Ref Range   WBC 6.7 3.9 - 10.3 10e3/uL   NEUT# 4.4 1.5 - 6.5 10e3/uL   HGB 11.4 (L) 11.6 - 15.9 g/dL   HCT 34.1 (L) 34.8 - 46.6 %   Platelets 221 145 - 400 10e3/uL   MCV 100.1 79.5 - 101.0 fL   MCH 33.3 25.1 - 34.0 pg   MCHC 33.3 31.5 - 36.0 g/dL   RBC 3.41 (L) 3.70 - 5.45 10e6/uL   RDW 19.2 (H) 11.2 - 14.5 %   lymph# 0.7 (L) 0.9 - 3.3 10e3/uL   MONO# 0.8 0.1 - 0.9 10e3/uL   Eosinophils Absolute 0.9 (H) 0.0 - 0.5 10e3/uL   Basophils Absolute 0.0 0.0 - 0.1 10e3/uL   NEUT% 65.0 38.4 - 76.8 %   LYMPH% 10.6 (L) 14.0 - 49.7 %   MONO% 11.2 0.0 - 14.0 %   EOS% 12.7 (H) 0.0 - 7.0 %   BASO% 0.5 0.0 - 2.0 %  Comprehensive metabolic panel (Cmet) - [facility]  Result Value Ref Range   Sodium 138 136 - 145 mEq/L   Potassium 3.9 3.5 - 5.1 mEq/L   Chloride 105 98 - 109 mEq/L   CO2 25 22 - 29 mEq/L   Glucose 97 70 - 140 mg/dl   BUN 22.0 7.0 - 26.0 mg/dL   Creatinine 1.1 0.6 - 1.1 mg/dL   Total Bilirubin 0.20 0.20 - 1.20 mg/dL   Alkaline Phosphatase 100 40 -  150 U/L   AST 11 5 - 34 U/L   ALT 14 0 - 55 U/L   Total Protein 6.6 6.4 - 8.3 g/dL   Albumin 3.4 (L) 3.5 - 5.0 g/dL   Calcium 9.0 8.4 - 10.4 mg/dL   Anion Gap 8 3 - 11 mEq/L   EGFR 60 (L) >90 ml/min/1.73 m2  Magnesium  Result Value Ref Range   Magnesium 1.8 1.5 - 2.5 mg/dl     Studies/Results:  No results found.  Medications: I have reviewed the patient's current medications. On magnesium 400 mg daily and ferrous fumarate, continue. Sister in law had concerns re nursing omission of pepcid with last chemo, discussed.  DISCUSSION: all of above interval information reviewed with patient and family member. Overall she is tolerating well and is in agreement with continuing as planned. She will call at any time if unable to keep down fluids adequately, or if other concerns.    Assessment/Plan:  1.recurrent/ progressive adenocarcinoma of cervix: clinical IIB at diagnosis 04-2014 treated initially with radiation and sensitizing cisplatinum, now with metastatic disease to pelvis and retroperitoneum, with right hydronephrosis. Systemic treatment in process with taxol CDDP avastin, in attempt to control disease. I will see her with labs 9-12 prior to treatment and supportive interventions 9-14 and 02-10-15. She will have CT AP ~ 9-26 and see Dr [name] on 9-29.  2. Urosepsis 09-2014 related to right hydronephrosis and right perinephric fluid collection. Percutaneous drainage of right perinephric fluid collection and right ureteral stent placed. Noncontrast CT stable right perinephric area prior to adding avastin on 12-10-14. For stent change 02-18-15.  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  4.peripheral IV access still adequate 5Multifactorial anemia, transfused earlier in course: some better on ferrous fumarate, continue 6.weight loss 47 lbs from Feb to May 2016. Some improvement in po intake including supplements, tho this is variable depending on nausea close to chemo 7.constipation, hemorrhoidal discomfort: improved. PO magmesium may help also.C difficile colitis also 09-2014. NEEDS STOOL FOR C DIFF IF ANY FURTHER DIARRHEA. 8.intermittent asthma, none x months 9. Mild orthostatic symptoms resolved with better po hydration 10.hypokalemia likely from CDDP: improved on oral K, continue and supplement with IVs with treatments. 11.magnesium lower also related to CDDP: continue IV supplements, continue po magnesium, follow 12.last mammograms 11-2013, post cholecystectomy 13. Intolerance to morphine and codeine causing nausea  All questions answered. Changes requested to scheduling. Time spent 25 min including >50% counseling and coordination  of care.    Gordy Levan, MD   01/20/2015, 7:36 PM

## 2015-01-23 ENCOUNTER — Other Ambulatory Visit: Payer: Self-pay | Admitting: Oncology

## 2015-01-24 ENCOUNTER — Telehealth: Payer: Self-pay | Admitting: Oncology

## 2015-01-24 NOTE — Telephone Encounter (Signed)
Called and left a message to call and go over her schedule per pof  Issues

## 2015-01-24 NOTE — Telephone Encounter (Signed)
Spoke with patient and we did go over her appointments and they are correct as scheduled  Stacy Webster

## 2015-01-26 ENCOUNTER — Telehealth: Payer: Self-pay

## 2015-01-26 NOTE — Telephone Encounter (Signed)
Stacy Webster called and left a message that she has noticed blood in her urine the last few days.  She is not experiencing any pain. Tried to reach patient but unsuccessful..  Nurse to follow up tomorrow with patient.

## 2015-01-27 ENCOUNTER — Telehealth: Payer: Self-pay | Admitting: Nurse Practitioner

## 2015-01-27 ENCOUNTER — Other Ambulatory Visit: Payer: Self-pay | Admitting: Oncology

## 2015-01-27 ENCOUNTER — Telehealth: Payer: Self-pay | Admitting: Oncology

## 2015-01-27 DIAGNOSIS — R319 Hematuria, unspecified: Secondary | ICD-10-CM

## 2015-01-27 NOTE — Telephone Encounter (Signed)
Spoke with patient and she is aware of her lab appointment °

## 2015-01-27 NOTE — Telephone Encounter (Signed)
Rn calling to f/u on hematuria. Patient reports 1-2 episodes on Monday and Wednesday. None noted today and patient denies pain. Per [name], would like to obtain urinalysis with culture and sensitivity on 01/28/15. Patient states she will probably be able to come to [facility] after school hours on 01/28/15 but if not, she will be able to come Tuesday 02/01/15. Pt encouraged to come tomorrow so we can quickly assess if there is a problem. She is also encouraged, per MD, to call urology to inform them of this and see if they have any other recommendations, and to increase po fluid intake. Patient verbalizes understanding and knows to call if symptoms worsen or change. POF sent and urine labs ordered.

## 2015-01-28 ENCOUNTER — Other Ambulatory Visit (HOSPITAL_BASED_OUTPATIENT_CLINIC_OR_DEPARTMENT_OTHER): Payer: BC Managed Care – PPO

## 2015-01-28 ENCOUNTER — Other Ambulatory Visit: Payer: Self-pay

## 2015-01-28 DIAGNOSIS — R319 Hematuria, unspecified: Secondary | ICD-10-CM

## 2015-01-28 LAB — URINALYSIS, MICROSCOPIC - CHCC
BILIRUBIN (URINE): NEGATIVE
GLUCOSE UR CHCC: NEGATIVE mg/dL
Ketones: NEGATIVE mg/dL
Nitrite: POSITIVE
PH: 6 (ref 4.6–8.0)
Protein: 100 mg/dL
SPECIFIC GRAVITY, URINE: 1.01 (ref 1.003–1.035)
Urobilinogen, UR: 0.2 mg/dL (ref 0.2–1)

## 2015-01-29 LAB — URINE CULTURE

## 2015-01-31 ENCOUNTER — Other Ambulatory Visit: Payer: Self-pay | Admitting: Oncology

## 2015-02-01 ENCOUNTER — Telehealth: Payer: Self-pay

## 2015-02-01 NOTE — Telephone Encounter (Signed)
LM stating no urinary infection as noted below by [name].

## 2015-02-01 NOTE — Telephone Encounter (Signed)
-----   Message from Gordy Levan, MD sent at 01/31/2015  4:56 PM EDT ----- Labs seen and need follow up:  Please let her know no infection in urine

## 2015-02-07 ENCOUNTER — Other Ambulatory Visit (HOSPITAL_BASED_OUTPATIENT_CLINIC_OR_DEPARTMENT_OTHER): Payer: BC Managed Care – PPO

## 2015-02-07 ENCOUNTER — Encounter: Payer: Self-pay | Admitting: Oncology

## 2015-02-07 ENCOUNTER — Ambulatory Visit (HOSPITAL_BASED_OUTPATIENT_CLINIC_OR_DEPARTMENT_OTHER): Payer: BC Managed Care – PPO | Admitting: Oncology

## 2015-02-07 VITALS — BP 165/86 | HR 89 | Temp 98.4°F | Resp 18 | Ht 64.0 in | Wt 157.8 lb

## 2015-02-07 DIAGNOSIS — R809 Proteinuria, unspecified: Secondary | ICD-10-CM | POA: Diagnosis not present

## 2015-02-07 DIAGNOSIS — G622 Polyneuropathy due to other toxic agents: Secondary | ICD-10-CM | POA: Diagnosis not present

## 2015-02-07 DIAGNOSIS — T451X5A Adverse effect of antineoplastic and immunosuppressive drugs, initial encounter: Secondary | ICD-10-CM

## 2015-02-07 DIAGNOSIS — D701 Agranulocytosis secondary to cancer chemotherapy: Secondary | ICD-10-CM

## 2015-02-07 DIAGNOSIS — D6481 Anemia due to antineoplastic chemotherapy: Secondary | ICD-10-CM

## 2015-02-07 DIAGNOSIS — C7989 Secondary malignant neoplasm of other specified sites: Secondary | ICD-10-CM

## 2015-02-07 DIAGNOSIS — G620 Drug-induced polyneuropathy: Secondary | ICD-10-CM

## 2015-02-07 DIAGNOSIS — C530 Malignant neoplasm of endocervix: Secondary | ICD-10-CM

## 2015-02-07 DIAGNOSIS — C539 Malignant neoplasm of cervix uteri, unspecified: Secondary | ICD-10-CM

## 2015-02-07 DIAGNOSIS — M12812 Other specific arthropathies, not elsewhere classified, left shoulder: Secondary | ICD-10-CM

## 2015-02-07 DIAGNOSIS — Z79899 Other long term (current) drug therapy: Secondary | ICD-10-CM

## 2015-02-07 LAB — COMPREHENSIVE METABOLIC PANEL (CC13)
ALT: 7 U/L (ref 0–55)
ANION GAP: 7 meq/L (ref 3–11)
AST: 10 U/L (ref 5–34)
Albumin: 3.1 g/dL — ABNORMAL LOW (ref 3.5–5.0)
Alkaline Phosphatase: 61 U/L (ref 40–150)
BUN: 16 mg/dL (ref 7.0–26.0)
CALCIUM: 9.3 mg/dL (ref 8.4–10.4)
CHLORIDE: 103 meq/L (ref 98–109)
CO2: 29 mEq/L (ref 22–29)
CREATININE: 1 mg/dL (ref 0.6–1.1)
EGFR: 64 mL/min/{1.73_m2} — ABNORMAL LOW (ref >90)
Glucose: 95 mg/dl (ref 70–140)
Potassium: 4.3 mEq/L (ref 3.5–5.1)
Sodium: 139 mEq/L (ref 136–145)
Total Bilirubin: 0.21 mg/dL (ref 0.20–1.20)
Total Protein: 6.4 g/dL (ref 6.4–8.3)

## 2015-02-07 LAB — UA PROTEIN, DIPSTICK - CHCC: PROTEIN: 100 mg/dL

## 2015-02-07 LAB — CBC WITH DIFFERENTIAL/PLATELET
BASO%: 0.7 % (ref 0.0–2.0)
BASOS ABS: 0.1 10*3/uL (ref 0.0–0.1)
EOS ABS: 0.4 10*3/uL (ref 0.0–0.5)
EOS%: 5.4 % (ref 0.0–7.0)
HEMATOCRIT: 32 % — AB (ref 34.8–46.6)
HGB: 10.8 g/dL — ABNORMAL LOW (ref 11.6–15.9)
LYMPH#: 0.8 10*3/uL — AB (ref 0.9–3.3)
LYMPH%: 9.9 % — ABNORMAL LOW (ref 14.0–49.7)
MCH: 33.7 pg (ref 25.1–34.0)
MCHC: 33.9 g/dL (ref 31.5–36.0)
MCV: 99.4 fL (ref 79.5–101.0)
MONO#: 1.3 10*3/uL — AB (ref 0.1–0.9)
MONO%: 16.4 % — ABNORMAL HIGH (ref 0.0–14.0)
NEUT#: 5.2 10*3/uL (ref 1.5–6.5)
NEUT%: 67.6 % (ref 38.4–76.8)
PLATELETS: 310 10*3/uL (ref 145–400)
RBC: 3.21 10*6/uL — ABNORMAL LOW (ref 3.70–5.45)
RDW: 18.3 % — ABNORMAL HIGH (ref 11.2–14.5)
WBC: 7.6 10*3/uL (ref 3.9–10.3)

## 2015-02-07 LAB — MAGNESIUM (CC13): MAGNESIUM: 1.7 mg/dL (ref 1.5–2.5)

## 2015-02-07 MED ORDER — POTASSIUM CHLORIDE 2 MEQ/ML IV SOLN: INTRAVENOUS | Status: DC

## 2015-02-07 NOTE — Progress Notes (Signed)
OFFICE PROGRESS NOTE   February 07, 2015   Physicians:Emma Carita Pian, MD, Michel Bickers, Leonidas Romberg, Genia Del), Marguerita Merles, Janyth Pupa  INTERVAL HISTORY:  Patient is seen, together with sister in law, in continuing attention metastatic cervical cancer involving pelvis and retroperitoneum, due cycle 4 CDDP/taxol and cycle 3 avastin later this week. Treatment is given on 2 days, with additional IVF and neulasta also on day 2. She will have CT on 02-21-15 and see Dr [name] on 02-24-15.  Patient has tolerated treatment well thus far, tho notices increasing neuropathy in feet and has protein of 100 mg/dl by dipstick again today.  She has no significant neuropathy in hands. Feet have slight increase in numbness and are painful if she stands for prolonged time. Appetite is good now and she has had no nausea and bowels are moving daily without diarrhea. She denies abdominal or pelvic pain. She has not had recent gross hematuria, is for right ureteral stent change ~ 02-18-15. Urine culture 01-28-15 had no growth. She has had only 32 oz fluid by late afternoon appointment today. She has had no other bleeding.  She has new pain and very limited elevation left shoulder in past week, similar to symptoms from rotator cuff on right some years ago (resolved without surgery).    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    ONCOLOGIC HISTORY Patient had been in usual good health, with no previous abnormal PAP smears, when she developed frequent urination and vaginal bleeding in late 2015. She saw PCP Dr Brigitte Pulse, then had endometrial biopsy by Dr Janyth Pupa [phone]) with adenocarcinoma. PET in [place] system 10-12-59 had hypermetabolic uptake within the uterine cervix and lower uterine segment and metastatic adenopathy within the bilateral  pelvis and periaortic retroperitoneum. Dr Genia Del and Dr Marguerita Merles treated with pelvic and para-aortic radiation given with 6 cycles of sensitizing CDDP, and HDR x5 from 07-06-14 thru 09-03-14. Patient had a difficult time with nausea, vomiting, diarrhea and hypokalemia/ hypomagnesemia thru treatment. Patient used ODT zofran and ativan during chemotherapy. She had a difficult time doing oral prehydration for CDDP. She had no peripheral neuropathy with the CDDP. Chemotherapy information is not available at time of this consultation and will be requested; patient does not know if she received EMEND with CDDP. There was concern by completion of the treatment that she did not have optimal response, with plan for repeat evaluation at 6 weeks from completion of the treatment. Prior to planned reevaluation, patient began having daily fevers for possibly 4 weeks, as high as 102 degrees x 2 weeks prior to 10-11-14; by presentation at ED she also had increased right flank pain. She had CT at [facility] on 10-08-14, this compared with PET of 05-2014 showed increase in subcapsular fluid collection right kidney and right hydronephrosis, increase in size of endometrial canal/ uterus/ right adnexa, and improvement in retroperitoneal, common iliac and bilateral pelvic sidewall adenopathy. Symptoms worsened and she was admitted thru [facility] ED to ICU on 5-16/ 10-12-14 with sepsis from peptostreptococcus. She had percutaneous drainage by IR of 635 cc dark thin fluid from right perinephric area on 10-12-14, with perinephric drain removed by urology on 10-18-14. She had right ureteral stent placement by Dr Junious Silk on 10-13-14. She developed C.difficile diarrhea, begun on flagyl 10-15-14 x 2 weeks, also on ceftin  during that time. Patient understands that she is to have the ureteral stent changed in 3 months.  She saw Dr [name] in consultation on 11-05-14, with cervix grossly normal appearance, firm to  palpation with right anterior extension, and purulent material from cervical os with manipulation, no palpable adnexal masses, induration R>L parametrium. PET 06-02-05 had hypermetabolic tissue thickening at cecum, + small right sided mesenteric and inguinal nodes, foci in myometriium and in region of cervix, small right perinephric fluid collection and dilated fluid filled endometrium. Dr [name] saw her following the PET, with recommendation for systemic treatment of the metastatic disease with CDDP, taxol and avastin. First CDDP Taxol 11-19-14, neutropenic day 14. Repeat CT showed essentially stable, minimal right perinephric fluid on 12-06-14, prior to cycle 2 CDDP taxol 7-14 with first avastin 12-10-14 and neulasta.    Review of systems as above, also: No fever. No SOB with present activity level. No other new or different pain. No trauma to left shoulder. No LE swelling. No flank pain Remainder of 10 point Review of Systems negative.  Objective:  Vital signs in last 24 hours:  BP 165/86 mmHg  Pulse 89  Temp(Src) 98.4 F (36.9 C) (Oral)  Resp 18  Ht 5' 4"  (1.626 m)  Wt 157 lb 12.8 oz (71.578 kg)  BMI 27.07 kg/m2  SpO2 100%  LMP 11/05/2011 Weight up 4 lbs. Looks comfortable, in good spirits.  Alert, oriented and appropriate. Ambulatory without difficulty.  Alopecia  HEENT:PERRL, sclerae not icteric. Oral mucosa moist without lesions, posterior pharynx clear.  Neck supple. No JVD.  Lymphatics:no cervical,supraclavicular,  or inguinal adenopathy Resp: clear to auscultation bilaterally and normal percussion bilaterally Cardio: regular rate and rhythm. No gallop. GI: soft, nontender, not distended, no mass or organomegaly. Normally active bowel sounds.  Musculoskeletal/ Extremities: Unable to elevate at left shoulder above ~ 45 degrees. UE and LE without pitting edema, cords, tenderness. Left hand and forearm fully functional. Neuro: peripheral neuropathy feet as noted. Otherwise nonfocal.  PSYCH appropriate mood and affect Skin without rash, ecchymosis, petechiae   Lab Results:  Results for orders placed or performed in visit on 02/07/15  CBC with Differential  Result Value Ref Range   WBC 7.6 3.9 - 10.3 10e3/uL   NEUT# 5.2 1.5 - 6.5 10e3/uL   HGB 10.8 (L) 11.6 - 15.9 g/dL   HCT 32.0 (L) 34.8 - 46.6 %   Platelets 310 145 - 400 10e3/uL   MCV 99.4 79.5 - 101.0 fL   MCH 33.7 25.1 - 34.0 pg   MCHC 33.9 31.5 - 36.0 g/dL   RBC 3.21 (L) 3.70 - 5.45 10e6/uL   RDW 18.3 (H) 11.2 - 14.5 %   lymph# 0.8 (L) 0.9 - 3.3 10e3/uL   MONO# 1.3 (H) 0.1 - 0.9 10e3/uL   Eosinophils Absolute 0.4 0.0 - 0.5 10e3/uL   Basophils Absolute 0.1 0.0 - 0.1 10e3/uL   NEUT% 67.6 38.4 - 76.8 %   LYMPH% 9.9 (L) 14.0 - 49.7 %   MONO% 16.4 (H) 0.0 - 14.0 %   EOS% 5.4 0.0 - 7.0 %   BASO% 0.7 0.0 - 2.0 %  Comprehensive metabolic panel (Cmet) - [facility]  Result Value Ref Range   Sodium 139 136 - 145 mEq/L   Potassium 4.3 3.5 - 5.1 mEq/L   Chloride 103 98 - 109 mEq/L   CO2 29 22 - 29 mEq/L   Glucose 95 70 - 140 mg/dl   BUN 16.0 7.0 - 26.0 mg/dL   Creatinine  1.0 0.6 - 1.1 mg/dL   Total Bilirubin 0.21 0.20 - 1.20 mg/dL   Alkaline Phosphatase 61 40 - 150 U/L   AST 10 5 - 34 U/L   ALT 7 0 - 55 U/L   Total Protein 6.4 6.4 - 8.3 g/dL   Albumin 3.1 (L) 3.5 - 5.0 g/dL   Calcium 9.3 8.4 - 10.4 mg/dL   Anion Gap 7 3 - 11 mEq/L   EGFR 64 (L) >90 ml/min/1.73 m2  Urine protein by dipstick - [facility]  Result Value Ref Range   Protein, ur 100 Negative- <30 mg/dL  Magnesium  Result Value Ref Range   Magnesium 1.7 1.5 - 2.5 mg/dl    24 hour urine for total protein ordered, patient given collection container and directions now. Per lab, turn around time 3-5 days, see below.  Studies/Results:  No results found.  Medications: I have reviewed the patient's current medications.  DISCUSSION: Results of recent UA discussed in light of ureteral stent; fact that culture had no growth on 01-28-15 is best information re  infection status. As she is not well hydrated today, have decided to repeat urine protein by dipstick on 02-10-15. If that specimen has <100 mg/dl protein ok to give avastin on 02-10-15, otherwise hold avastin at least until results of 24 hour urine for total protein. Even if UA protein <100 on 02-10-15, will still send the 24 hour collection. Stent is likely causing some additional protein in urine, but will check due to the avastin. Discussed peripheral neuropathy secondary to taxol and CDDP. Patient feels that this is tolerable at present and prefers to take chemo as planned, which certainly is likely best from standpoint of the metastatic cancer.  She understands that she needs to avoid any stress on left shoulder. May need orthopedics evaluation if no improvement.   Assessment/Plan:  1.recurrent/ progressive adenocarcinoma of cervix: clinical IIB at diagnosis 04-2014 treated initially with radiation and sensitizing cisplatin, now metastatic disease to pelvis and retroperitoneum, with right hydronephrosis. Systemic treatment in process with taxol CDDP avastin, in attempt to control disease. I will see her with labs 9-12 prior to treatment and supportive interventions 9-14 and 02-10-15. She will have CT AP ~ 9-26 and see Dr [name] on 9-29.  I will set up further appointments depending on findings with that reevaluation 2. Urosepsis 09-2014 related to right hydronephrosis and right perinephric fluid collection. Percutaneous drainage of right perinephric fluid collection and right ureteral stent placed. Noncontrast CT stable right perinephric area prior to adding avastin on 12-10-14. For stent change 02-18-15.  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  4.peripheral IV access still adequate. She had not wanted PAC prior to start of avastin 5. Multifactorial anemia, transfused  earlier in course: continues ferrous fumarate, hemoglobin a little lower but not obviously symptomatic 6.weight loss 47 lbs from Feb to May 2016. Some improvement in po intake, again encouraged good hydration  7.constipation, hemorrhoidal discomfort: improved. PO magmesium may help also.C difficile colitis also 09-2014. NEEDS STOOL FOR C DIFF IF ANY FURTHER DIARRHEA. 8.intermittent asthma, none x months 9. Mild orthostatic symptoms resolved with better po hydration 10.previous hypokalemia and hypomagnesemia due to  CDDP: improved on po and IV supplements, follow 11.mammograms 11-2013 12 post cholecystectomy 13. Probable rotator cuff tear left shoulder: with rest of situation she prefers to treat conservatively for present 14.Intolerance to morphine and  codeine causing nausea   All questions answered. Chemo, avastin, neulasta, IVF orders confirmed for 9-14 and 02-10-15. Time spent 30 min including >50% counseling and coordination of care.Cc Drs [name],  Junious Silk and Corene Cornea, MD   02/07/2015, 5:53 PM

## 2015-02-08 ENCOUNTER — Telehealth: Payer: Self-pay | Admitting: Oncology

## 2015-02-08 DIAGNOSIS — R809 Proteinuria, unspecified: Secondary | ICD-10-CM | POA: Insufficient documentation

## 2015-02-08 DIAGNOSIS — D6481 Anemia due to antineoplastic chemotherapy: Secondary | ICD-10-CM | POA: Insufficient documentation

## 2015-02-08 DIAGNOSIS — C539 Malignant neoplasm of cervix uteri, unspecified: Secondary | ICD-10-CM | POA: Insufficient documentation

## 2015-02-08 DIAGNOSIS — T451X5A Adverse effect of antineoplastic and immunosuppressive drugs, initial encounter: Secondary | ICD-10-CM

## 2015-02-08 DIAGNOSIS — M12819 Other specific arthropathies, not elsewhere classified, unspecified shoulder: Secondary | ICD-10-CM | POA: Insufficient documentation

## 2015-02-08 DIAGNOSIS — Z79899 Other long term (current) drug therapy: Secondary | ICD-10-CM | POA: Insufficient documentation

## 2015-02-08 NOTE — Telephone Encounter (Signed)
Appointments adjusted per pof,dr livesay did not state date of her appointment after dr [name] so an appointment was booked/held   Stacy Webster

## 2015-02-09 ENCOUNTER — Other Ambulatory Visit: Payer: BC Managed Care – PPO

## 2015-02-09 ENCOUNTER — Ambulatory Visit (HOSPITAL_BASED_OUTPATIENT_CLINIC_OR_DEPARTMENT_OTHER): Payer: BC Managed Care – PPO

## 2015-02-09 VITALS — BP 150/81 | HR 97 | Temp 97.0°F | Resp 16

## 2015-02-09 DIAGNOSIS — C786 Secondary malignant neoplasm of retroperitoneum and peritoneum: Secondary | ICD-10-CM | POA: Diagnosis not present

## 2015-02-09 DIAGNOSIS — Z5111 Encounter for antineoplastic chemotherapy: Secondary | ICD-10-CM | POA: Diagnosis not present

## 2015-02-09 DIAGNOSIS — C530 Malignant neoplasm of endocervix: Secondary | ICD-10-CM

## 2015-02-09 DIAGNOSIS — C539 Malignant neoplasm of cervix uteri, unspecified: Secondary | ICD-10-CM

## 2015-02-09 DIAGNOSIS — C7989 Secondary malignant neoplasm of other specified sites: Secondary | ICD-10-CM

## 2015-02-09 LAB — PROTEIN, URINE, 24 HOUR
COLLECTION INTERVAL-UPROT: 24 h
Protein, 24H Urine: 770 mg/d — ABNORMAL HIGH (ref <150)
Protein, Urine: 35 mg/dL — ABNORMAL HIGH (ref 5–24)
Urine Total Volume-UPROT: 2200 mL

## 2015-02-09 MED ORDER — POTASSIUM CHLORIDE 2 MEQ/ML IV SOLN
INTRAVENOUS | Status: DC
  Administered 2015-02-09: 10:00:00 via INTRAVENOUS
  Filled 2015-02-09: qty 1000

## 2015-02-09 MED ORDER — DIPHENHYDRAMINE HCL 50 MG/ML IJ SOLN
50.0000 mg | Freq: Once | INTRAMUSCULAR | Status: AC
  Administered 2015-02-09: 50 mg via INTRAVENOUS

## 2015-02-09 MED ORDER — SODIUM CHLORIDE 0.9 % IV SOLN
Freq: Once | INTRAVENOUS | Status: AC
  Administered 2015-02-09: 10:00:00 via INTRAVENOUS
  Filled 2015-02-09: qty 8

## 2015-02-09 MED ORDER — DIPHENHYDRAMINE HCL 50 MG/ML IJ SOLN
INTRAMUSCULAR | Status: AC
  Filled 2015-02-09: qty 1

## 2015-02-09 MED ORDER — LORAZEPAM 2 MG/ML IJ SOLN
0.5000 mg | Freq: Once | INTRAMUSCULAR | Status: AC
  Administered 2015-02-09: 0.5 mg via INTRAVENOUS

## 2015-02-09 MED ORDER — FAMOTIDINE IN NACL 20-0.9 MG/50ML-% IV SOLN
INTRAVENOUS | Status: AC
  Filled 2015-02-09: qty 50

## 2015-02-09 MED ORDER — SODIUM CHLORIDE 0.9 % IV SOLN
40.0000 mg/m2 | Freq: Once | INTRAVENOUS | Status: AC
  Administered 2015-02-09: 70 mg via INTRAVENOUS
  Filled 2015-02-09: qty 70

## 2015-02-09 MED ORDER — SODIUM CHLORIDE 0.9 % IV SOLN: 15.0000 mg/kg | Freq: Once | INTRAVENOUS | Status: DC

## 2015-02-09 MED ORDER — LORAZEPAM 2 MG/ML IJ SOLN
INTRAMUSCULAR | Status: AC
  Filled 2015-02-09: qty 1

## 2015-02-09 MED ORDER — SODIUM CHLORIDE 0.9 % IV SOLN
Freq: Once | INTRAVENOUS | Status: AC
  Administered 2015-02-09: 09:00:00 via INTRAVENOUS

## 2015-02-09 MED ORDER — SODIUM CHLORIDE 0.9 % IV SOLN: Freq: Once | INTRAVENOUS | Status: AC

## 2015-02-09 MED ORDER — FAMOTIDINE IN NACL 20-0.9 MG/50ML-% IV SOLN
20.0000 mg | Freq: Once | INTRAVENOUS | Status: AC
Start: 2015-02-09 — End: 2015-02-09
  Administered 2015-02-09: 20 mg via INTRAVENOUS

## 2015-02-09 MED ORDER — PACLITAXEL CHEMO INJECTION 300 MG/50ML
135.0000 mg/m2 | Freq: Once | INTRAVENOUS | Status: AC
  Administered 2015-02-09: 234 mg via INTRAVENOUS
  Filled 2015-02-09: qty 39

## 2015-02-09 MED ORDER — SODIUM CHLORIDE 0.9 % IV SOLN
Freq: Once | INTRAVENOUS | Status: AC
  Administered 2015-02-09: 16:00:00 via INTRAVENOUS
  Filled 2015-02-09: qty 5

## 2015-02-09 NOTE — Patient Instructions (Signed)
[facility] Discharge Instructions for Patients Receiving Chemotherapy  Today you received the following chemotherapy agents taxol, cisplatin  To help prevent nausea and vomiting after your treatment, we encourage you to take your nausea medication    If you develop nausea and vomiting that is not controlled by your nausea medication, call the clinic.   BELOW ARE SYMPTOMS THAT SHOULD BE REPORTED IMMEDIATELY:  *FEVER GREATER THAN 100.5 F  *CHILLS WITH OR WITHOUT FEVER  NAUSEA AND VOMITING THAT IS NOT CONTROLLED WITH YOUR NAUSEA MEDICATION  *UNUSUAL SHORTNESS OF BREATH  *UNUSUAL BRUISING OR BLEEDING  TENDERNESS IN MOUTH AND THROAT WITH OR WITHOUT PRESENCE OF ULCERS  *URINARY PROBLEMS  *BOWEL PROBLEMS  UNUSUAL RASH Items with * indicate a potential emergency and should be followed up as soon as possible.  Feel free to call the clinic you have any questions or concerns. The clinic phone number is [phone].  Please show the [facility] at check-in to the Emergency Department and triage nurse.

## 2015-02-10 ENCOUNTER — Ambulatory Visit (HOSPITAL_BASED_OUTPATIENT_CLINIC_OR_DEPARTMENT_OTHER): Payer: BC Managed Care – PPO

## 2015-02-10 ENCOUNTER — Other Ambulatory Visit (HOSPITAL_BASED_OUTPATIENT_CLINIC_OR_DEPARTMENT_OTHER): Payer: BC Managed Care – PPO

## 2015-02-10 ENCOUNTER — Encounter (HOSPITAL_BASED_OUTPATIENT_CLINIC_OR_DEPARTMENT_OTHER): Payer: Self-pay | Admitting: *Deleted

## 2015-02-10 VITALS — BP 141/66 | HR 74 | Temp 98.5°F | Resp 18

## 2015-02-10 DIAGNOSIS — Z5189 Encounter for other specified aftercare: Secondary | ICD-10-CM

## 2015-02-10 DIAGNOSIS — C530 Malignant neoplasm of endocervix: Secondary | ICD-10-CM

## 2015-02-10 DIAGNOSIS — C7989 Secondary malignant neoplasm of other specified sites: Secondary | ICD-10-CM | POA: Diagnosis not present

## 2015-02-10 DIAGNOSIS — Z5112 Encounter for antineoplastic immunotherapy: Secondary | ICD-10-CM

## 2015-02-10 DIAGNOSIS — R809 Proteinuria, unspecified: Secondary | ICD-10-CM

## 2015-02-10 DIAGNOSIS — C539 Malignant neoplasm of cervix uteri, unspecified: Secondary | ICD-10-CM

## 2015-02-10 LAB — UA PROTEIN, DIPSTICK - CHCC: PROTEIN: 30 mg/dL

## 2015-02-10 MED ORDER — SODIUM CHLORIDE 0.9 % IV SOLN
14.7000 mg/kg | Freq: Once | INTRAVENOUS | Status: AC
  Administered 2015-02-10: 1000 mg via INTRAVENOUS
  Filled 2015-02-10: qty 40

## 2015-02-10 MED ORDER — PEGFILGRASTIM 6 MG/0.6ML ~~LOC~~ PSKT
6.0000 mg | PREFILLED_SYRINGE | Freq: Once | SUBCUTANEOUS | Status: AC
Start: 2015-02-10 — End: 2015-02-10
  Administered 2015-02-10: 6 mg via SUBCUTANEOUS
  Filled 2015-02-10: qty 0.6

## 2015-02-10 MED ORDER — SODIUM CHLORIDE 0.9 % IV SOLN
Freq: Once | INTRAVENOUS | Status: AC
  Administered 2015-02-10: 10:00:00 via INTRAVENOUS
  Filled 2015-02-10: qty 1000

## 2015-02-10 NOTE — Patient Instructions (Signed)
[facility] Discharge Instructions for Patients Receiving Chemotherapy  Today you received the following chemotherapy agents Avastin   To help prevent nausea and vomiting after your treatment, we encourage you to take your nausea medication as directed.    If you develop nausea and vomiting that is not controlled by your nausea medication, call the clinic.   BELOW ARE SYMPTOMS THAT SHOULD BE REPORTED IMMEDIATELY:  *FEVER GREATER THAN 100.5 F  *CHILLS WITH OR WITHOUT FEVER  NAUSEA AND VOMITING THAT IS NOT CONTROLLED WITH YOUR NAUSEA MEDICATION  *UNUSUAL SHORTNESS OF BREATH  *UNUSUAL BRUISING OR BLEEDING  TENDERNESS IN MOUTH AND THROAT WITH OR WITHOUT PRESENCE OF ULCERS  *URINARY PROBLEMS  *BOWEL PROBLEMS  UNUSUAL RASH Items with * indicate a potential emergency and should be followed up as soon as possible.  Feel free to call the clinic you have any questions or concerns. The clinic phone number is [phone].  Please show the CHEMO ALERT CARD at check-in to the Emergency Department and triage nurse.   

## 2015-02-10 NOTE — Progress Notes (Addendum)
NPO AFTER MN.  ARRIVE AT 0800.  NEEDS ISTAT 8.  WILL TAKE AM MEDS DOS W/ SIPS OF WATER.

## 2015-02-10 NOTE — Progress Notes (Signed)
0840 Urine specimen sent to lab.

## 2015-02-11 ENCOUNTER — Telehealth: Payer: Self-pay | Admitting: *Deleted

## 2015-02-11 ENCOUNTER — Other Ambulatory Visit: Payer: Self-pay | Admitting: Oncology

## 2015-02-11 ENCOUNTER — Telehealth: Payer: Self-pay | Admitting: Oncology

## 2015-02-11 NOTE — Telephone Encounter (Signed)
lvm fo rpt regarding 10.6 appt time change.Marland KitchenMarland Kitchen

## 2015-02-11 NOTE — Telephone Encounter (Signed)
PT.'S APPOINTMENT TIME ON 03/03/15 IS AT 10:00AM. 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

## 2015-02-11 NOTE — Telephone Encounter (Signed)
POF done by MD now to change apt time

## 2015-02-18 ENCOUNTER — Encounter (HOSPITAL_BASED_OUTPATIENT_CLINIC_OR_DEPARTMENT_OTHER): Payer: Self-pay | Admitting: Anesthesiology

## 2015-02-18 ENCOUNTER — Ambulatory Visit (HOSPITAL_BASED_OUTPATIENT_CLINIC_OR_DEPARTMENT_OTHER): Payer: BC Managed Care – PPO | Admitting: Anesthesiology

## 2015-02-18 ENCOUNTER — Encounter (HOSPITAL_BASED_OUTPATIENT_CLINIC_OR_DEPARTMENT_OTHER)
Admission: RE | Disposition: A | Discharge status: Home / Self Care | Payer: Self-pay | Source: Ambulatory Visit | Attending: Urology

## 2015-02-18 ENCOUNTER — Ambulatory Visit (HOSPITAL_BASED_OUTPATIENT_CLINIC_OR_DEPARTMENT_OTHER)
Admission: RE | Admit: 2015-02-18 | Discharge: 2015-02-18 | Disposition: A | Discharge status: Home / Self Care | Payer: BC Managed Care – PPO | Source: Ambulatory Visit | Attending: Urology | Admitting: Urology

## 2015-02-18 DIAGNOSIS — T451X5S Adverse effect of antineoplastic and immunosuppressive drugs, sequela: Secondary | ICD-10-CM | POA: Diagnosis not present

## 2015-02-18 DIAGNOSIS — Z7952 Long term (current) use of systemic steroids: Secondary | ICD-10-CM | POA: Diagnosis not present

## 2015-02-18 DIAGNOSIS — G620 Drug-induced polyneuropathy: Secondary | ICD-10-CM | POA: Diagnosis not present

## 2015-02-18 DIAGNOSIS — G709 Myoneural disorder, unspecified: Secondary | ICD-10-CM | POA: Insufficient documentation

## 2015-02-18 DIAGNOSIS — Z79899 Other long term (current) drug therapy: Secondary | ICD-10-CM | POA: Insufficient documentation

## 2015-02-18 DIAGNOSIS — N135 Crossing vessel and stricture of ureter without hydronephrosis: Secondary | ICD-10-CM | POA: Diagnosis present

## 2015-02-18 DIAGNOSIS — D090 Carcinoma in situ of bladder: Secondary | ICD-10-CM | POA: Diagnosis not present

## 2015-02-18 DIAGNOSIS — C786 Secondary malignant neoplasm of retroperitoneum and peritoneum: Secondary | ICD-10-CM | POA: Diagnosis not present

## 2015-02-18 DIAGNOSIS — Z923 Personal history of irradiation: Secondary | ICD-10-CM | POA: Insufficient documentation

## 2015-02-18 DIAGNOSIS — J45909 Unspecified asthma, uncomplicated: Secondary | ICD-10-CM | POA: Insufficient documentation

## 2015-02-18 DIAGNOSIS — N131 Hydronephrosis with ureteral stricture, not elsewhere classified: Secondary | ICD-10-CM | POA: Diagnosis not present

## 2015-02-18 DIAGNOSIS — K219 Gastro-esophageal reflux disease without esophagitis: Secondary | ICD-10-CM | POA: Insufficient documentation

## 2015-02-18 DIAGNOSIS — Z8541 Personal history of malignant neoplasm of cervix uteri: Secondary | ICD-10-CM | POA: Diagnosis not present

## 2015-02-18 DIAGNOSIS — C7989 Secondary malignant neoplasm of other specified sites: Secondary | ICD-10-CM | POA: Insufficient documentation

## 2015-02-18 HISTORY — DX: Unspecified hydronephrosis: N13.30

## 2015-02-18 HISTORY — DX: Frequency of micturition: R35.0

## 2015-02-18 HISTORY — DX: Secondary malignant neoplasm of retroperitoneum and peritoneum: C78.6

## 2015-02-18 HISTORY — DX: Adverse effect of antineoplastic and immunosuppressive drugs, initial encounter: G62.0

## 2015-02-18 HISTORY — DX: Reserved for inherently not codable concepts without codable children: IMO0001

## 2015-02-18 HISTORY — DX: Gastro-esophageal reflux disease without esophagitis: K21.9

## 2015-02-18 HISTORY — DX: Elevated blood-pressure reading, without diagnosis of hypertension: R03.0

## 2015-02-18 HISTORY — DX: Mild intermittent asthma, uncomplicated: J45.20

## 2015-02-18 HISTORY — PX: CYSTOSCOPY WITH URETEROSCOPY AND STENT PLACEMENT: SHX6377

## 2015-02-18 HISTORY — DX: Malignant neoplasm of cervix uteri, unspecified: C53.9

## 2015-02-18 HISTORY — DX: Adverse effect of antineoplastic and immunosuppressive drugs, initial encounter: T45.1X5A

## 2015-02-18 HISTORY — PX: CYSTOSCOPY WITH BIOPSY: SHX5122

## 2015-02-18 HISTORY — DX: Crossing vessel and stricture of ureter without hydronephrosis: N13.5

## 2015-02-18 HISTORY — DX: Anemia due to antineoplastic chemotherapy: D64.81

## 2015-02-18 HISTORY — DX: Personal history of other infectious and parasitic diseases: Z86.19

## 2015-02-18 HISTORY — DX: Unspecified rotator cuff tear or rupture of left shoulder, not specified as traumatic: M75.102

## 2015-02-18 HISTORY — DX: Other specific arthropathies, not elsewhere classified, left shoulder: M12.812

## 2015-02-18 HISTORY — PX: CYSTOSCOPY W/ RETROGRADES: SHX1426

## 2015-02-18 LAB — POCT I-STAT, CHEM 8
BUN: 17 mg/dL (ref 6–20)
Calcium, Ion: 1.24 mmol/L — ABNORMAL HIGH (ref 1.12–1.23)
Chloride: 100 mmol/L — ABNORMAL LOW (ref 101–111)
Creatinine, Ser: 1 mg/dL (ref 0.44–1.00)
Glucose, Bld: 92 mg/dL (ref 65–99)
HCT: 37 % (ref 36.0–46.0)
Hemoglobin: 12.6 g/dL (ref 12.0–15.0)
Potassium: 3.9 mmol/L (ref 3.5–5.1)
Sodium: 138 mmol/L (ref 135–145)
TCO2: 25 mmol/L (ref 0–100)

## 2015-02-18 SURGERY — CYSTOURETEROSCOPY, WITH STENT INSERTION
Anesthesia: General | Site: Renal | Laterality: Right

## 2015-02-18 MED ORDER — EPHEDRINE SULFATE 50 MG/ML IJ SOLN
INTRAMUSCULAR | Status: DC | PRN
  Administered 2015-02-18: 15 mg via INTRAVENOUS

## 2015-02-18 MED ORDER — KETOROLAC TROMETHAMINE 30 MG/ML IJ SOLN
INTRAMUSCULAR | Status: DC | PRN
  Administered 2015-02-18: 30 mg via INTRAVENOUS

## 2015-02-18 MED ORDER — CEFAZOLIN SODIUM-DEXTROSE 2-3 GM-% IV SOLR
INTRAVENOUS | Status: AC
  Filled 2015-02-18: qty 50

## 2015-02-18 MED ORDER — FENTANYL CITRATE (PF) 100 MCG/2ML IJ SOLN
25.0000 ug | INTRAMUSCULAR | Status: DC | PRN
  Filled 2015-02-18: qty 1

## 2015-02-18 MED ORDER — LIDOCAINE HCL 2 % EX GEL
CUTANEOUS | Status: DC | PRN
  Administered 2015-02-18: 1 via URETHRAL

## 2015-02-18 MED ORDER — MIDAZOLAM HCL 5 MG/5ML IJ SOLN
INTRAMUSCULAR | Status: DC | PRN
  Administered 2015-02-18: 2 mg via INTRAVENOUS

## 2015-02-18 MED ORDER — CEFAZOLIN SODIUM 1-5 GM-% IV SOLN
1.0000 g | INTRAVENOUS | Status: DC
  Filled 2015-02-18: qty 50

## 2015-02-18 MED ORDER — LACTATED RINGERS IV SOLN
INTRAVENOUS | Status: DC
  Administered 2015-02-18: 09:00:00 via INTRAVENOUS
  Filled 2015-02-18: qty 1000

## 2015-02-18 MED ORDER — FENTANYL CITRATE (PF) 100 MCG/2ML IJ SOLN
INTRAMUSCULAR | Status: AC
  Filled 2015-02-18: qty 4

## 2015-02-18 MED ORDER — PROPOFOL 10 MG/ML IV BOLUS
INTRAVENOUS | Status: DC | PRN
  Administered 2015-02-18: 160 mg via INTRAVENOUS

## 2015-02-18 MED ORDER — IOHEXOL 350 MG/ML SOLN
INTRAVENOUS | Status: DC | PRN
  Administered 2015-02-18: 4 mL

## 2015-02-18 MED ORDER — ONDANSETRON HCL 4 MG/2ML IJ SOLN
INTRAMUSCULAR | Status: DC | PRN
  Administered 2015-02-18: 4 mg via INTRAVENOUS

## 2015-02-18 MED ORDER — FENTANYL CITRATE (PF) 100 MCG/2ML IJ SOLN
INTRAMUSCULAR | Status: DC | PRN
  Administered 2015-02-18: 50 ug via INTRAVENOUS

## 2015-02-18 MED ORDER — CEFAZOLIN SODIUM-DEXTROSE 2-3 GM-% IV SOLR
2.0000 g | INTRAVENOUS | Status: AC
  Administered 2015-02-18: 2 g via INTRAVENOUS
  Filled 2015-02-18: qty 50

## 2015-02-18 MED ORDER — LIDOCAINE HCL (CARDIAC) 20 MG/ML IV SOLN
INTRAVENOUS | Status: DC | PRN
  Administered 2015-02-18: 70 mg via INTRAVENOUS

## 2015-02-18 MED ORDER — SODIUM CHLORIDE 0.9 % IR SOLN
Status: DC | PRN
  Administered 2015-02-18: 4000 mL

## 2015-02-18 MED ORDER — PROMETHAZINE HCL 25 MG/ML IJ SOLN
6.2500 mg | INTRAMUSCULAR | Status: DC | PRN
  Filled 2015-02-18: qty 1

## 2015-02-18 MED ORDER — CEPHALEXIN 500 MG PO CAPS: 500.0000 mg | ORAL_CAPSULE | Freq: Two times a day (BID) | ORAL | Status: DC

## 2015-02-18 MED ORDER — ACETAMINOPHEN 10 MG/ML IV SOLN
INTRAVENOUS | Status: DC | PRN
  Administered 2015-02-18: 1000 mg via INTRAVENOUS

## 2015-02-18 MED ORDER — BELLADONNA ALKALOIDS-OPIUM 16.2-60 MG RE SUPP
RECTAL | Status: AC
  Filled 2015-02-18: qty 1

## 2015-02-18 MED ORDER — DEXAMETHASONE SODIUM PHOSPHATE 4 MG/ML IJ SOLN
INTRAMUSCULAR | Status: DC | PRN
  Administered 2015-02-18: 10 mg via INTRAVENOUS

## 2015-02-18 MED ORDER — MIDAZOLAM HCL 2 MG/2ML IJ SOLN
INTRAMUSCULAR | Status: AC
  Filled 2015-02-18: qty 2

## 2015-02-18 SURGICAL SUPPLY — 36 items
ADAPTER CATH URET PLST 4-6FR (CATHETERS) IMPLANT
BAG DRAIN URO-CYSTO SKYTR STRL (DRAIN) ×5 IMPLANT
BASKET LASER NITINOL 1.9FR (BASKET) IMPLANT
BASKET STNLS GEMINI 4WIRE 3FR (BASKET) IMPLANT
BASKET ZERO TIP NITINOL 2.4FR (BASKET) IMPLANT
CANISTER SUCT LVC 12 LTR MEDI- (MISCELLANEOUS) IMPLANT
CATH INTERMIT  6FR 70CM (CATHETERS) ×5 IMPLANT
CATH URET 5FR 28IN CONE TIP (BALLOONS)
CATH URET 5FR 28IN OPEN ENDED (CATHETERS) IMPLANT
CATH URET 5FR 70CM CONE TIP (BALLOONS) IMPLANT
CATH URET DUAL LUMEN 6-10FR 50 (CATHETERS) IMPLANT
CLOTH BEACON ORANGE TIMEOUT ST (SAFETY) ×5 IMPLANT
ELECT REM PT RETURN 9FT ADLT (ELECTROSURGICAL)
ELECTRODE REM PT RTRN 9FT ADLT (ELECTROSURGICAL) IMPLANT
GLOVE BIO SURGEON STRL SZ7.5 (GLOVE) ×5 IMPLANT
GLOVE BIOGEL PI IND STRL 7.5 (GLOVE) ×3 IMPLANT
GLOVE BIOGEL PI INDICATOR 7.5 (GLOVE) ×2
GLOVE SURG SS PI 7.5 STRL IVOR (GLOVE) ×5 IMPLANT
GOWN STRL REUS W/ TWL LRG LVL3 (GOWN DISPOSABLE) IMPLANT
GOWN STRL REUS W/ TWL XL LVL3 (GOWN DISPOSABLE) ×3 IMPLANT
GOWN STRL REUS W/TWL LRG LVL3 (GOWN DISPOSABLE) ×5 IMPLANT
GOWN STRL REUS W/TWL XL LVL3 (GOWN DISPOSABLE) ×2
GUIDEWIRE 0.038 PTFE COATED (WIRE) IMPLANT
GUIDEWIRE ANG ZIPWIRE 038X150 (WIRE) IMPLANT
GUIDEWIRE STR DUAL SENSOR (WIRE) ×5 IMPLANT
IV NS IRRIG 3000ML ARTHROMATIC (IV SOLUTION) ×10 IMPLANT
KIT BALLIN UROMAX 15FX10 (LABEL) IMPLANT
KIT BALLN UROMAX 15FX4 (MISCELLANEOUS) IMPLANT
KIT BALLN UROMAX 26 75X4 (MISCELLANEOUS)
MANIFOLD NEPTUNE II (INSTRUMENTS) ×5 IMPLANT
PACK CYSTO (CUSTOM PROCEDURE TRAY) ×5 IMPLANT
SET HIGH PRES BAL DIL (LABEL)
SHEATH ACCESS URETERAL 38CM (SHEATH) IMPLANT
STENT URET 6FRX24 CONTOUR (STENTS) ×5 IMPLANT
SYRINGE IRR TOOMEY STRL 70CC (SYRINGE) IMPLANT
WATER STERILE IRR 3000ML UROMA (IV SOLUTION) ×5 IMPLANT

## 2015-02-18 NOTE — Anesthesia Procedure Notes (Signed)
Procedure Name: LMA Insertion Date/Time: 02/18/2015 9:21 AM Performed by: Wanita Chamberlain Pre-anesthesia Checklist: Patient identified, Timeout performed, Emergency Drugs available, Suction available and Patient being monitored Patient Re-evaluated:Patient Re-evaluated prior to inductionOxygen Delivery Method: Circle system utilized Preoxygenation: Pre-oxygenation with 100% oxygen Intubation Type: IV induction Ventilation: Mask ventilation without difficulty LMA: LMA inserted LMA Size: 4.0 Number of attempts: 1 Airway Equipment and Method: Bite block Placement Confirmation: positive ETCO2 and breath sounds checked- equal and bilateral Tube secured with: Tape Dental Injury: Teeth and Oropharynx as per pre-operative assessment

## 2015-02-18 NOTE — Op Note (Signed)
Preoperative diagnosis: Metastatic cervical cancer involving the pelvis and retroperitoneum, right ureteral stricture Postoperative diagnosis: Same  Procedure: Cystoscopy, bladder biopsy with fulguration, right diagnostic ureteroscopy, Right retrograde pyelogram, right ureteral stent exchange  Surgeon: Eskridge  Type of anesthesia: Gen.  Indication for procedure: 53 year old with metastatic cervical cancer who is undergone a prior right ureteral stent right hydronephrosis and right ureteral stricture. She returns today for her first stent change.  Findings: On cystoscopy the urethra appeared normal. The trigone and bullous edema primarily around the right UO which is also quite erythematous and this spread over the back of the right bladder. This was quite representative of similar erythematous and edematous changes in the right distal ureter. The remainder of the bladder was unremarkable. There was some edema around the left ureteral orifice but clear efflux was seen from the orifice.  The stent was quite clean despite being in for 4 months.  On ureteroscopy the intramural and more distal sections of the right distal ureter were pale white and strictured. There was no mass. Scope passed easily and passively dilated this area. Above the white pale mucosa was erythematous bullous changes similar to the changes in the bladder. And then about the junction of  the distal ureter to the mid ureter the ureter appeared normal proximal to this.   Right retrograde pyelogram-this outlined the collecting system which appeared mildly dilated without filling defect.   Description of procedure: After consent was obtained patient brought to the operating room. After adequate anesthesia she was placed in lithotomy position and prepped and draped in the usual sterile fashion. A timeout was performed to confirm the patient and procedure. The cystoscope was passed per urethra and the bladder inspected. The right  ureteral stent was grasped and removed through the urethral meatus. A sensor wire was advanced to the stent coiled in the collecting system.   Adjacent to the sensor wire advanced the ureteroscope and inspected the distal and mid ureter.   The cystoscope was then repassed and superficial biopsies were obtained 2 with a representative mucosal changes. There was not a lot of bleeding. The tissue underneath was indurated. Light fulguration was performed. Hemostasis was excellent.  The wire was then backloaded on the cystoscope and a 6 x 24 cm stent was advanced. The wire was removed with a good coil reconstituting at the renal pelvis and a good coil in the bladder. The bladder was drained and the scope removed. Lidocaine jelly was infiltrated per urethra. She was awakened and taken to the recovery room in stable condition.   Complications: None  Blood loss: Minimal  Specimens: Bladder biopsy 2 to pathology   Drains: 6 x 24 cm right ureteral stent

## 2015-02-18 NOTE — H&P (Signed)
Reason For Visit Schedule for ureteral stent exchange   History of Present Illness   GU Hx:  Consultation for right ureteral stricture referred by Dr. Candiss Norse. Gyn Onc Dr. Polly Cobia.     1-right ureteral stricture-May 2016-patient with stage IIB metastatic endocervical adenocarcinoma. She has been under the care of Dr. Loralee Pacas and Dr. Polly Cobia receiving treatment with whole pelvic radiation therapy with chemosensitization completed April 2016. She developed fever and a septic like picture. CT scan revealed right hydronephrosis and a large right perinephric fluid collection which had been slowly progressing since January 2016. She underwent cystoscopy right retrograde pyelogram and right ureteral stent placement with dilation of the ureter after retrograde revealed a strictured 2-3 cm distal right ureter as it entered the bladder. On discharge her kidney function did return back to baseline with a BUN of 6 and creatinine 0.94.    2-right perinephric fluid collection-May 2016-as above patient had a right perinephric drain placed when she was septic. The drainage was scant and the fluid collection for the most part resolved on follow-up imaging. Therefore the drain was removed prior to discharge. CT imaging on 07/11 showed stable mild hydronephrosis and stable mild perinephric fluid collection.    August 2016: She continues to see Dr Marko Plume receiving CDDP/taxol/avastin with last visit on 08/19. BUN/Cr were 11/1.     Her appetite and strength are doing ok. She has had some mild but for the most part intermittent RLQ pain. As far as her urinary tract she's had no dysuria or gross hematuria. Has had no flank pain. She has some urgency and some mild urge incontinence which is not bothered. Just some occasional frequency. She does note some mild intermittent numbness over her metatarsals bilaterally.   Past Medical History Problems  1. History of asthma (Z87.09) 2. History of cervical cancer  (Z85.41) 3. History of esophageal reflux (Z87.19)  Surgical History Problems  1. History of Cesarean Section 2. History of Cholecystectomy 3. History of Cystoscopy With Insertion Of Ureteral Stent Right 4. History of Cystourethroscopy W/ Ureteroscopy W/ Tx Of Ureteral Strict 5. History of Ear Surgery 6. History of Tonsillectomy With Adenoidectomy  Current Meds 1. Acetaminophen 500 MG Oral Tablet;  Therapy: (Recorded:22Aug2016) to Recorded 2. Dexamethasone 4 MG Oral Tablet;  Therapy: (Recorded:22Aug2016) to Recorded 3. Docusate Sodium 100 MG Oral Capsule;  Therapy: (Recorded:22Aug2016) to Recorded 4. Hemocyte 324 (106 Fe) MG Oral Tablet;  Therapy: (Recorded:22Aug2016) to Recorded 5. Lomotil TABS (Diphenoxylate-Atropine);  Therapy: (Recorded:22Aug2016) to Recorded 6. LORazepam 1 MG Oral Tablet;  Therapy: (Recorded:01Jun2016) to Recorded 7. Magnesium TABS;  Therapy: (Recorded:22Aug2016) to Recorded 8. Nutritional Drink LIQD;  Therapy: (Recorded:22Aug2016) to Recorded 9. Ondansetron HCl - 8 MG Oral Tablet;  Therapy: (Recorded:22Aug2016) to Recorded 10. Pantoprazole Sodium 40 MG Oral Tablet Delayed Release;   Therapy: (Recorded:22Aug2016) to Recorded 11. Potassium Chloride ER 10 MEQ Oral Capsule Extended Release;   Therapy: (Recorded:22Aug2016) to Recorded  Allergies Medication  1. Codeine Derivatives 2. Morphine Derivatives  Family History Problems  1. No significant family history : Mother, Father  Social History Problems  1. Denied: History of Alcohol use 2. Married 3. Never smoker 4. Occupation   Pharmacist, hospital  Review of Systems  Genitourinary: urinary urgency, but no dysuria, urinary stream does not start and stop and no hematuria.  Gastrointestinal: no flank pain.  Constitutional: feeling tired (fatigue), but no fever.    Vitals Vital Signs [Data Includes: Last 1 Day]  Recorded: 22Aug2016 04:20PM  Blood Pressure: 119 / 80 Temperature: 99.3  F Heart Rate:  124  Physical Exam Constitutional: Well nourished and well developed . No acute distress.  ENT:. The ears and nose are normal in appearance.  Neck: The appearance of the neck is normal and no neck mass is present.  Pulmonary: No respiratory distress and normal respiratory rhythm and effort.  Cardiovascular: Heart rate and rhythm are normal . No peripheral edema.  Abdomen: The abdomen is soft and nontender. No masses are palpated. No CVA tenderness. No hernias are palpable. No hepatosplenomegaly noted.  Skin: Normal skin turgor, no visible rash and no visible skin lesions.  Neuro/Psych:. Mood and affect are appropriate.    Results/Data Urine [Data Includes: Last 1 Day]   22Aug2016  COLOR YELLOW   APPEARANCE CLEAR   SPECIFIC GRAVITY 1.015   pH 6.0   GLUCOSE NEGATIVE   BILIRUBIN NEGATIVE   KETONE NEGATIVE   BLOOD TRACE   PROTEIN 2+   NITRITE NEGATIVE   LEUKOCYTE ESTERASE 1+   SQUAMOUS EPITHELIAL/HPF 0-5 HPF  WBC 10-20 WBC/HPF  RBC 0-2 RBC/HPF  BACTERIA FEW HPF  CRYSTALS NONE SEEN HPF  CASTS NONE SEEN LPF  Yeast NONE SEEN HPF   The following images/tracing/specimen were independently visualized:  Right Renal U/S shows ureteral stent noted in the renal pelvis and bladder. Mild hydronephrosis and perinephric fluid collection are stable and not increased since CT imaging in July. PVR 30 ml.  The following clinical lab reports were reviewed:  UA with few bacteria and 10-20 wbc/hpf. No hematuria noted.  Discussed:  Dr Junious Silk.    Assessment Assessed  1. Ureteral stricture (N13.5)  Plan Health Maintenance  1. UA With REFLEX; [Do Not Release]; Status:Complete;   Done: 22Aug2016 03:45PM Ureteral stricture  2. Follow-up Schedule Surgery Office  I will give a green sheet to Pam to set her up for  ureteral stent exchange.  Status: Hold For - Appointment  Requested for: 22Aug2016  Discussion/Summary I will culture her urine today and set her up for Dr Junious Silk to perform a stent  exchange in the next few weeks. She may also require dilation so we will prepare for that too. Overall she is doing remarkable well having gotten to take her daughter to college and attend a family vacation at the beach. Her noted paresthesia doesn't appear to be from a urological process and her renal u/s shows no changes from imaging in July. I am encouraged by her determination and spirits. I strongly suggested to please let us know if we can help in anyway moving forward. Appropriate f/u given regarding worsening of symptoms.   Signatures Electronically signed by : Jiles Crocker, ANP-C; Jan 17 2015  9:04PM EST  Add: urine cx grew 20k mixed growth.

## 2015-02-18 NOTE — Interval H&P Note (Signed)
History and Physical Interval Note:  02/18/2015 9:12 AM  Stacy Webster  has presented today for surgery, with the diagnosis of RIGHT HYDRONEPHROSIS, RIGHT URETERAL STRICTURE  The various methods of treatment have been discussed with the patient and family. After consideration of risks, benefits and other options for treatment, the patient has consented to  Procedure(s): CYSTO WITH RIGHT URETERAL STENT EXCHANGE, RIGHT URETEROSCOPY WITH POSSIBLE BALOON DILATION (Right) as a surgical intervention . She has been well. No fever. No dysuria. Occasional right flank discomfort. Labs normal. CT pending Monday. The patient's history has been reviewed, patient examined, no change in status, stable for surgery.  I have reviewed the patient's chart and labs.  Questions were answered to the patient's satisfaction.     ESKRIDGE, MATTHEW

## 2015-02-18 NOTE — Transfer of Care (Signed)
Immediate Anesthesia Transfer of Care Note  Patient: Stacy Webster  Procedure(s) Performed: Procedure(s): CYSTO WITH RIGHT URETERAL STENT EXCHANGE, RIGHT URETEROSCOPY  (Right) CYSTOSCOPY WITH  BLADDER  BIOPSY (N/A) CYSTOSCOPY WITH RETROGRADE PYELOGRAM (Right)  Patient Location: PACU  Anesthesia Type:General  Level of Consciousness: awake, alert , oriented and patient cooperative  Airway & Oxygen Therapy: Patient Spontanous Breathing and Patient connected to nasal cannula oxygen  Post-op Assessment: Report given to RN and Post -op Vital signs reviewed and stable  Post vital signs: Reviewed and stable  Last Vitals:  Filed Vitals:   02/18/15 0815  BP: 150/85  Pulse: 91  Temp: 36.7 C  Resp: 12    Complications: No apparent anesthesia complications

## 2015-02-18 NOTE — Anesthesia Postprocedure Evaluation (Signed)
  Anesthesia Post-op Note  Patient: Stacy Webster  Procedure(s) Performed: Procedure(s) (LRB): CYSTO WITH RIGHT URETERAL STENT EXCHANGE, RIGHT URETEROSCOPY  (Right) CYSTOSCOPY WITH  BLADDER  BIOPSY (N/A) CYSTOSCOPY WITH RETROGRADE PYELOGRAM (Right)  Patient Location: PACU  Anesthesia Type: General  Level of Consciousness: awake and alert   Airway and Oxygen Therapy: Patient Spontanous Breathing  Post-op Pain: mild  Post-op Assessment: Post-op Vital signs reviewed, Patient's Cardiovascular Status Stable, Respiratory Function Stable, Patent Airway and No signs of Nausea or vomiting  Last Vitals:  Filed Vitals:   02/18/15 1115  BP: 137/68  Pulse: 68  Temp: 36.6 C  Resp: 12    Post-op Vital Signs: stable   Complications: No apparent anesthesia complications

## 2015-02-18 NOTE — Anesthesia Preprocedure Evaluation (Addendum)
Anesthesia Evaluation  Patient identified by MRN, date of birth, ID band Patient awake    Reviewed: Allergy & Precautions, NPO status , Patient's Chart, lab work & pertinent test results  Airway Mallampati: II  TM Distance: >3 FB Neck ROM: Full    Dental no notable dental hx. (+) Teeth Intact, Dental Advisory Given, Chipped,    Pulmonary asthma ,    Pulmonary exam normal breath sounds clear to auscultation       Cardiovascular negative cardio ROS Normal cardiovascular exam Rhythm:Regular Rate:Normal  10-13-14 2D Echo - Left ventricle: The cavity size was normal. Wall thickness was increased in a pattern of mild LVH. Systolic function was normal. The estimated ejection fraction was in the range of 55% to 60%. Wall motion was normal; there were no regional wall motion abnormalities. Left ventricular diastolic function parameters were normal.   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  Neuromuscular disease negative psych ROS   GI/Hepatic Neg liver ROS, GERD  Medicated,  Endo/Other  negative endocrine ROS  Renal/GU Renal disease   Hx Cervical cancer negative genitourinary   Musculoskeletal negative musculoskeletal ROS (+)   Abdominal   Peds negative pediatric ROS (+)  Hematology negative hematology ROS (+) anemia ,   Anesthesia Other Findings   Reproductive/Obstetrics negative OB ROS                           Anesthesia Physical Anesthesia Plan  ASA: II  Anesthesia Plan: General   Post-op Pain Management:    Induction: Intravenous  Airway Management Planned: LMA  Additional Equipment:   Intra-op Plan:   Post-operative Plan: Extubation in OR  Informed Consent: I have reviewed the patients History and Physical, chart, labs and discussed the procedure including the risks, benefits and alternatives for the proposed anesthesia with the patient or authorized  representative who has indicated his/her understanding and acceptance.   Dental advisory given  Plan Discussed with: CRNA  Anesthesia Plan Comments: (LMA #5 10-13-14)        Anesthesia Quick Evaluation

## 2015-02-18 NOTE — Discharge Instructions (Signed)
Ureteral Stent Implantation, Care After °Refer to this sheet in the next few weeks. These instructions provide you with information on caring for yourself after your procedure. Your health care provider may also give you more specific instructions. Your treatment has been planned according to current medical practices, but problems sometimes occur. Call your health care provider if you have any problems or questions after your procedure. °WHAT TO EXPECT AFTER THE PROCEDURE °You should be back to normal activity within 48 hours after the procedure. Nausea and vomiting may occur and are commonly the result of anesthesia. °It is common to experience sharp pain in the back or lower abdomen and penis with voiding. This is caused by movement of the ends of the stent with the act of urinating. It usually goes away within minutes after you have stopped urinating. °HOME CARE INSTRUCTIONS °Make sure to drink plenty of fluids. You may have small amounts of bleeding, causing your urine to be red. This is normal. Certain movements may trigger pain or a feeling that you need to urinate. You may be given medicines to prevent infection or bladder spasms. Be sure to take all medicines as directed. Only take over-the-counter or prescription medicines for pain, discomfort, or fever as directed by your health care provider. Do not take aspirin, as this can make bleeding worse. °Your stent will be left in until the blockage is resolved. This may take 2 weeks or longer, depending on the reason for stent implantation. You may have an X-ray exam to make sure your ureter is open and that the stent has not moved out of position (migrated). The stent can be removed by your health care provider in the office. Medicines may be given for comfort while the stent is being removed. Be sure to keep all follow-up appointments so your health care provider can check that you are healing properly. °SEEK MEDICAL CARE IF: °· You experience increasing  pain. °· Your pain medicine is not working. °SEEK IMMEDIATE MEDICAL CARE IF: °· Your urine is dark red or has blood clots. °· You are leaking urine (incontinent). °· You have a fever, chills, feeling sick to your stomach (nausea), or vomiting. °· Your pain is not relieved by pain medicine. °· The end of the stent comes out of the urethra. °· You are unable to urinate. °Document Released: 01/14/2013 Document Revised: 05/19/2013 Document Reviewed: 01/14/2013 °[employer]® Patient Information ©2015 [employer]. This information is not intended to replace advice given to you by your health care provider. Make sure you discuss any questions you have with your health care provider. ° ° °Post Anesthesia Home Care Instructions ° °Activity: °Get plenty of rest for the remainder of the day. A responsible adult should stay with you for 24 hours following the procedure.  °For the next 24 hours, DO NOT: °-Drive a car °-Operate machinery °-Drink alcoholic beverages °-Take any medication unless instructed by your physician °-Make any legal decisions or sign important papers. ° °Meals: °Start with liquid foods such as gelatin or soup. Progress to regular foods as tolerated. Avoid greasy, spicy, heavy foods. If nausea and/or vomiting occur, drink only clear liquids until the nausea and/or vomiting subsides. Call your physician if vomiting continues. ° °Special Instructions/Symptoms: °Your throat may feel dry or sore from the anesthesia or the breathing tube placed in your throat during surgery. If this causes discomfort, gargle with warm salt water. The discomfort should disappear within 24 hours. ° °If you had a scopolamine patch placed behind your ear for   the management of post- operative nausea and/or vomiting: ° °1. The medication in the patch is effective for 72 hours, after which it should be removed.  Wrap patch in a tissue and discard in the trash. Wash hands thoroughly with soap and water. °2. You may remove the patch  earlier than 72 hours if you experience unpleasant side effects which may include dry mouth, dizziness or visual disturbances. °3. Avoid touching the patch. Wash your hands with soap and water after contact with the patch. °  ° ° °

## 2015-02-21 ENCOUNTER — Ambulatory Visit (HOSPITAL_COMMUNITY)
Admission: RE | Admit: 2015-02-21 | Discharge: 2015-02-21 | Disposition: A | Discharge status: Home / Self Care | Payer: BC Managed Care – PPO | Source: Ambulatory Visit | Attending: Oncology | Admitting: Oncology

## 2015-02-21 ENCOUNTER — Encounter (HOSPITAL_BASED_OUTPATIENT_CLINIC_OR_DEPARTMENT_OTHER): Payer: Self-pay | Admitting: Urology

## 2015-02-21 DIAGNOSIS — N261 Atrophy of kidney (terminal): Secondary | ICD-10-CM | POA: Insufficient documentation

## 2015-02-21 DIAGNOSIS — C7989 Secondary malignant neoplasm of other specified sites: Secondary | ICD-10-CM

## 2015-02-21 DIAGNOSIS — C539 Malignant neoplasm of cervix uteri, unspecified: Secondary | ICD-10-CM | POA: Insufficient documentation

## 2015-02-21 MED ORDER — IOHEXOL 300 MG/ML  SOLN
100.0000 mL | Freq: Once | INTRAMUSCULAR | Status: AC | PRN
  Administered 2015-02-21: 100 mL via INTRAVENOUS

## 2015-02-24 ENCOUNTER — Ambulatory Visit: Payer: BC Managed Care – PPO | Attending: Gynecologic Oncology | Admitting: Gynecologic Oncology

## 2015-02-24 ENCOUNTER — Encounter: Payer: Self-pay | Admitting: Gynecologic Oncology

## 2015-02-24 ENCOUNTER — Encounter (HOSPITAL_COMMUNITY): Payer: Self-pay

## 2015-02-24 VITALS — BP 152/85 | HR 92 | Temp 98.5°F | Resp 20 | Ht 64.0 in | Wt 155.6 lb

## 2015-02-24 DIAGNOSIS — C539 Malignant neoplasm of cervix uteri, unspecified: Secondary | ICD-10-CM | POA: Diagnosis not present

## 2015-02-24 NOTE — Progress Notes (Signed)
Followup Patient Note: [name]  CC:  Chief Complaint  Patient presents with  . Recurrent cervical cancer    follow-up  progressive cancer on imaging, discussion about test results.  Assessment/Plan:  [name]  is a 54 y.o.  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 S/p stent changed (right ureteral stent).  Responding to treatment based on CT findings.  Recommend continuing cis/taxol/avastin for an additional 3 cycles.  Recommend repeating PET at that time (the PET has been better at demonstrating the retroperitoneal disease and peri-cecal disease).   Will continue salvage therapy until no measurable disease.  I will see her back after 3 more cycles and a PET.  HPI: Stacy Webster is a 60 year old woman with a history of stage II B cervical adenocarcinoma. She was diagnosed with this condition in December of 2015. She was seen initially by Dr Genia Del in [place]. At the time of diagnosis she had positive lymph nodes in the pelvis on CT imaging. She was treated with primary chemo-radiation which was completed on April 8th. The patient reports completing the course of therapy on schedule with no delays.  She reports that when she completed therapy there was some concern by her treating physicians that her response had been incomplete. They had planned to wait until she was further out from therapy before obtaining a PET scan to evaluate for response.   In May, 2016 she developed febrile morbidity and manifestionas of sepsis. She presented to the [facility] ER and was admitted and noted to have a urinary tract infection and right ureteral obstruction which was new. She was treated with pressor support, IV antibiotics, and a right perinephric drain for  the right perinephric abscess.   On June 9th, 2016 she underwent PET/CT to evaluate for recurrent disease as the cause of her new right ureteral obstruction (CT had not clearly shown recurrence/progression). It demonstrated:  There is irregular soft tissue thickening along the posterior wall of the cecum which is hypermetabolic. SUV max is 6.8. This is worrisome for tumor infiltration. There are also metabolically active small mesenteric lymph nodes near the right colon (largest node measures 7 mm). The uterus remains enlarged with marked dilatation of the endometrial canal which is filled with fluid. This is likely due to cervical obstruction from the patient's cervical cancer. Several foci of hypermetabolism are noted in the myometrium. Could not exclude tumor implants. This could be residual tumor or radiation change. There are small pelvic sidewall lymph nodes but they are not hypermetabolic. There are borderline enlarged inguinal lymph nodes which are hypermetabolic.   She was started on salvage chemotherapy with cisplatin, paclitaxel and bevacizumab on 11/19/14.   Interval History: She has completed 3 cycles of cddp/taxol/bev. She is tolerating this well. She is working. She has very occasional back pains, but stable weight, normal bowel function. CT scan of the chest abdomen and pelvis performed on 02/21/2015 revealed no new lesions, mild prominence and enhancement of the cervix consistent with treatment effect, small residual ileocolic and right abdominal mesenteric lymph nodes measuring 4-6 mm mildly improved. No distant metastasis. Right renal atrophy with resolving subcapsular seroma and hematoma.  Current Meds:  Outpatient Encounter Prescriptions as of 02/24/2015  Medication Sig  . ALBUTEROL IN Inhale into the lungs as needed.  Marland Kitchen dexamethasone (DECADRON) 4  MG tablet Take 5 tablets with food 12 hrs and 6 hrs prior to Taxol Chemotherapy  . diphenoxylate-atropine (LOMOTIL) 2.5-0.025 MG  per tablet Take 1 tablet by mouth as needed. Can take up to 8 times daily  . docusate sodium (COLACE) 100 MG capsule Take 100 mg by mouth daily as needed.   Marland Kitchen HEMOCYTE 324 (106 FE) MG TABS Take 325 mg by mouth daily. Take 1 tablet daily on empty stomach with Vitamin C tablet  . ibuprofen (ADVIL,MOTRIN) 200 MG tablet Take 200-400 mg by mouth every 8 (eight) hours as needed.  . lactose free nutrition (BOOST PLUS) LIQD Take 237 mLs by mouth 3 (three) times daily with meals.  Marland Kitchen loratadine (CLARITIN) 10 MG tablet Take 10 mg by mouth daily as needed.  Marland Kitchen LORazepam (ATIVAN) 1 MG tablet Place 1/2 - 1 tablet under the tongue or swallow evry 4-6 hrs as needed for nausea  . Magnesium 100 MG CAPS Take 1 capsule by mouth daily.  . ondansetron (ZOFRAN-ODT) 8 MG disintegrating tablet Take 1 tablet (8 mg total) by mouth every 8 (eight) hours as needed for nausea or vomiting.  . pantoprazole (PROTONIX) 40 MG tablet Take 1 tablet (40 mg total) by mouth daily. (Patient taking differently: Take 40 mg by mouth every evening. )  . potassium chloride (K-DUR,KLOR-CON) 10 MEQ tablet Take 10 mEq by mouth daily.  . vitamin C (ASCORBIC ACID) 500 MG tablet Take 500 mg by mouth daily.  . [DISCONTINUED] cephALEXin (KEFLEX) 500 MG capsule Take 1 capsule (500 mg total) by mouth 2 (two) times daily.   No facility-administered encounter medications on file as of 02/24/2015.    Allergy:  Allergies  Allergen Reactions  . Morphine And Related Rash    Per pt, morphine also makes her vomit  . Codeine Nausea Only  . Hydrocodone Nausea And Vomiting    Social Hx:   Social History   Social History  . Marital Status: Married    Spouse Name: N/A  . Number of Children: N/A  . Years of Education: N/A   Occupational History  . Not on file.   Social History Main Topics  . Smoking status: Never Smoker   . Smokeless tobacco: Never Used  . Alcohol Use: No  . Drug Use: No  . Sexual Activity: Not on file   Other Topics Concern   . Not on file   Social History Narrative    Past Surgical Hx:  Past Surgical History  Procedure Laterality Date  . Cystoscopy w/ ureteral stent placement Right 10/13/2014    Procedure: CYSTOSCOPY WITH RETROGRADE PYELOGRAM/URETERAL STENT PLACEMENT;  Surgeon: Festus Aloe, MD;  Location: [facility];  Service: Urology;  Laterality: Right;  . Transthoracic echocardiogram  10-13-2014    mild LVH,  ef 55-60%/  mild TR  . Tonsillectomy and adenoidectomy  age 85    age 37--  redo-- Adenoidectomy  . Laparoscopic cholecystectomy  1990's  . Cystoscopy with ureteroscopy and stent placement Right 02/18/2015    Procedure: CYSTO WITH RIGHT URETERAL STENT EXCHANGE, RIGHT URETEROSCOPY ;  Surgeon: Festus Aloe, MD;  Location: [facility];  Service: Urology;  Laterality: Right;  . Cystoscopy with biopsy N/A 02/18/2015    Procedure: CYSTOSCOPY WITH  BLADDER  BIOPSY;  Surgeon: Festus Aloe, MD;  Location: [facility];  Service: Urology;  Laterality: N/A;  . Cystoscopy w/ retrogrades Right 02/18/2015    Procedure: CYSTOSCOPY WITH RETROGRADE PYELOGRAM;  Surgeon: Festus Aloe, MD;  Location: [facility];  Service: Urology;  Laterality: Right;    Past Medical Hx:  Past Medical History  Diagnosis Date  . Hydronephrosis, right   . Ureteral stricture, right   . History of septic shock     05/ 2016  due to Peptostriptococcus/  right hydronephrosis and obstruction----  resolved  . Peripheral neuropathy due to chemotherapy     feet  . Mild intermittent asthma   . History of Clostridium difficile     10-15-2014  resolved  . Left rotator cuff tear arthropathy   . White coat syndrome without hypertension   . GERD (gastroesophageal reflux disease)   . Frequency of urination   . Anemia associated with chemotherapy     Antineoplastic chemo  . Recurrent cervical cancer oncologist-  dr Rossi/ dr Marko Plume    dx 12/ 2015---  Radiation and Chemo (07-06-2014 to  09-03-2014)  now recurrent ,  Stage IIB  w/ Mets to pelvic/ retroperitoneum---  CHEMOTHERAPY CURRENTLY  . 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 Preceding this the patient has no prior history of abnormal pap smears, and had been up to date with cervical cancer screening.  Patient's last menstrual period was 11/05/2011.  Family Hx: History reviewed. No pertinent family history.  Review of Systems:  Constitutional  Feels well,  fatigued  ENT Normal appearing ears and nares bilaterally Skin/Breast  No rash, sores, jaundice, itching, dryness Cardiovascular  No chest pain, shortness of breath, or edema  Pulmonary  No cough or wheeze.  Gastro Intestinal  No nausea, vomitting, or diarrhoea. No bright red blood per rectum, no abdominal pain, change in bowel movement, or constipation.  Genito Urinary  No frequency, urgency, dysuria,  see HPIMusculo Skeletal  No myalgia, arthralgia, joint swelling or pain  Neurologic  No weakness, numbness, change in gait,  Psychology  No depression, anxiety, insomnia.   Vitals:  Blood pressure 152/85, pulse 92, temperature 98.5 F (36.9 C), temperature source Oral, resp. rate 20, height 5\' 4"  (1.626 m), weight 155 lb 9.6 oz (70.58 kg), last menstrual period 11/05/2011, SpO2 100 %.  Physical Exam: General: no distress Pulm: CTAB CVS: RRR, no murmur Abd: soft, nondistended, nonpainful Lymph: no cervical or inguinal adenopathy Back : no cva tenderness Genitourinary: Normal BUS. Vagina with agglutination. Cervix grossly normal in appearance but hard and nodular. No apparent new tumor. Rectovaginal exam: no palpable tumor in pelvis or nodularity.   Donaciano Eva, MD  02/24/2015, 2:16 PM

## 2015-02-24 NOTE — Patient Instructions (Signed)
Plan to follow with Dr Marko Plume as planned and please call with any questions or concerns.

## 2015-02-25 ENCOUNTER — Telehealth: Payer: Self-pay | Admitting: *Deleted

## 2015-02-25 ENCOUNTER — Encounter: Payer: Self-pay | Admitting: *Deleted

## 2015-02-25 ENCOUNTER — Telehealth: Payer: Self-pay | Admitting: Oncology

## 2015-02-25 ENCOUNTER — Other Ambulatory Visit: Payer: Self-pay | Admitting: Oncology

## 2015-02-25 NOTE — Telephone Encounter (Signed)
Per staff message and POF I have scheduled appts. Advised scheduler of appts. JMW  

## 2015-02-25 NOTE — Telephone Encounter (Signed)
Spoke with patients husband and he is aware of her 10/3 lab appointment

## 2015-02-28 ENCOUNTER — Other Ambulatory Visit: Payer: Self-pay

## 2015-02-28 ENCOUNTER — Other Ambulatory Visit: Payer: Self-pay | Admitting: Oncology

## 2015-02-28 ENCOUNTER — Other Ambulatory Visit (HOSPITAL_BASED_OUTPATIENT_CLINIC_OR_DEPARTMENT_OTHER): Payer: BC Managed Care – PPO

## 2015-02-28 DIAGNOSIS — R809 Proteinuria, unspecified: Secondary | ICD-10-CM

## 2015-02-28 DIAGNOSIS — C539 Malignant neoplasm of cervix uteri, unspecified: Secondary | ICD-10-CM

## 2015-02-28 DIAGNOSIS — C530 Malignant neoplasm of endocervix: Secondary | ICD-10-CM | POA: Diagnosis not present

## 2015-02-28 DIAGNOSIS — C7989 Secondary malignant neoplasm of other specified sites: Secondary | ICD-10-CM

## 2015-02-28 LAB — COMPREHENSIVE METABOLIC PANEL (CC13)
ALBUMIN: 3 g/dL — AB (ref 3.5–5.0)
ALK PHOS: 66 U/L (ref 40–150)
ALT: <9 U/L (ref 0–55)
ANION GAP: 8 meq/L (ref 3–11)
AST: 11 U/L (ref 5–34)
BUN: 15.8 mg/dL (ref 7.0–26.0)
CO2: 27 mEq/L (ref 22–29)
Calcium: 9.2 mg/dL (ref 8.4–10.4)
Chloride: 103 mEq/L (ref 98–109)
Creatinine: 1 mg/dL (ref 0.6–1.1)
EGFR: 62 mL/min/{1.73_m2} — AB (ref >90)
GLUCOSE: 102 mg/dL (ref 70–140)
Potassium: 4.1 mEq/L (ref 3.5–5.1)
Sodium: 138 mEq/L (ref 136–145)
TOTAL PROTEIN: 6.5 g/dL (ref 6.4–8.3)

## 2015-02-28 LAB — CBC WITH DIFFERENTIAL/PLATELET
BASO%: 0.3 % (ref 0.0–2.0)
Basophils Absolute: 0 10*3/uL (ref 0.0–0.1)
EOS ABS: 0.2 10*3/uL (ref 0.0–0.5)
EOS%: 1.9 % (ref 0.0–7.0)
HCT: 31.1 % — ABNORMAL LOW (ref 34.8–46.6)
HEMOGLOBIN: 10.3 g/dL — AB (ref 11.6–15.9)
LYMPH%: 11.5 % — ABNORMAL LOW (ref 14.0–49.7)
MCH: 33.7 pg (ref 25.1–34.0)
MCHC: 33.1 g/dL (ref 31.5–36.0)
MCV: 101.6 fL — AB (ref 79.5–101.0)
MONO#: 1.1 10*3/uL — AB (ref 0.1–0.9)
MONO%: 12.2 % (ref 0.0–14.0)
NEUT%: 74.1 % (ref 38.4–76.8)
NEUTROS ABS: 6.5 10*3/uL (ref 1.5–6.5)
PLATELETS: 324 10*3/uL (ref 145–400)
RBC: 3.06 10*6/uL — ABNORMAL LOW (ref 3.70–5.45)
RDW: 15.8 % — AB (ref 11.2–14.5)
WBC: 8.8 10*3/uL (ref 3.9–10.3)
lymph#: 1 10*3/uL (ref 0.9–3.3)

## 2015-02-28 LAB — UA PROTEIN, DIPSTICK - CHCC: Protein, ur: 100 mg/dL

## 2015-02-28 LAB — MAGNESIUM (CC13): MAGNESIUM: 1.8 mg/dL (ref 1.5–2.5)

## 2015-03-01 ENCOUNTER — Encounter: Payer: Self-pay | Admitting: Oncology

## 2015-03-01 ENCOUNTER — Ambulatory Visit (HOSPITAL_BASED_OUTPATIENT_CLINIC_OR_DEPARTMENT_OTHER): Payer: BC Managed Care – PPO

## 2015-03-01 ENCOUNTER — Other Ambulatory Visit: Payer: Self-pay | Admitting: Oncology

## 2015-03-01 ENCOUNTER — Telehealth: Payer: Self-pay | Admitting: Oncology

## 2015-03-01 ENCOUNTER — Other Ambulatory Visit (HOSPITAL_BASED_OUTPATIENT_CLINIC_OR_DEPARTMENT_OTHER): Payer: BC Managed Care – PPO

## 2015-03-01 VITALS — BP 149/81 | HR 98 | Temp 98.8°F | Resp 19

## 2015-03-01 DIAGNOSIS — Z5111 Encounter for antineoplastic chemotherapy: Secondary | ICD-10-CM

## 2015-03-01 DIAGNOSIS — C539 Malignant neoplasm of cervix uteri, unspecified: Secondary | ICD-10-CM

## 2015-03-01 DIAGNOSIS — C530 Malignant neoplasm of endocervix: Secondary | ICD-10-CM | POA: Diagnosis not present

## 2015-03-01 DIAGNOSIS — Z79899 Other long term (current) drug therapy: Secondary | ICD-10-CM

## 2015-03-01 DIAGNOSIS — R809 Proteinuria, unspecified: Secondary | ICD-10-CM

## 2015-03-01 LAB — UA PROTEIN, DIPSTICK - CHCC
Protein, ur: 100 mg/dL
Protein, ur: <30 mg/dL

## 2015-03-01 MED ORDER — SODIUM CHLORIDE 0.9 % IV SOLN
Freq: Once | INTRAVENOUS | Status: AC
  Administered 2015-03-01: 13:00:00 via INTRAVENOUS
  Filled 2015-03-01: qty 5

## 2015-03-01 MED ORDER — CISPLATIN CHEMO INJECTION 100MG/100ML
40.0000 mg/m2 | Freq: Once | INTRAVENOUS | Status: AC
  Administered 2015-03-01: 70 mg via INTRAVENOUS
  Filled 2015-03-01: qty 70

## 2015-03-01 MED ORDER — FAMOTIDINE IN NACL 20-0.9 MG/50ML-% IV SOLN
INTRAVENOUS | Status: AC
  Filled 2015-03-01: qty 50

## 2015-03-01 MED ORDER — DIPHENHYDRAMINE HCL 50 MG/ML IJ SOLN
INTRAMUSCULAR | Status: AC
  Filled 2015-03-01: qty 1

## 2015-03-01 MED ORDER — DIPHENHYDRAMINE HCL 50 MG/ML IJ SOLN
50.0000 mg | Freq: Once | INTRAMUSCULAR | Status: AC
  Administered 2015-03-01: 50 mg via INTRAVENOUS

## 2015-03-01 MED ORDER — POTASSIUM CHLORIDE 2 MEQ/ML IV SOLN
INTRAVENOUS | Status: DC
  Administered 2015-03-01: 10:00:00 via INTRAVENOUS
  Filled 2015-03-01: qty 1000

## 2015-03-01 MED ORDER — PACLITAXEL CHEMO INJECTION 300 MG/50ML
135.0000 mg/m2 | Freq: Once | INTRAVENOUS | Status: AC
  Administered 2015-03-01: 234 mg via INTRAVENOUS
  Filled 2015-03-01: qty 39

## 2015-03-01 MED ORDER — SODIUM CHLORIDE 0.9 % IV SOLN
Freq: Once | INTRAVENOUS | Status: AC
  Administered 2015-03-01: 12:00:00 via INTRAVENOUS
  Filled 2015-03-01: qty 8

## 2015-03-01 MED ORDER — FAMOTIDINE IN NACL 20-0.9 MG/50ML-% IV SOLN
20.0000 mg | Freq: Once | INTRAVENOUS | Status: AC
  Administered 2015-03-01: 20 mg via INTRAVENOUS

## 2015-03-01 MED ORDER — LORAZEPAM 2 MG/ML IJ SOLN
INTRAMUSCULAR | Status: AC
  Filled 2015-03-01: qty 1

## 2015-03-01 MED ORDER — LORAZEPAM 2 MG/ML IJ SOLN
0.5000 mg | Freq: Once | INTRAMUSCULAR | Status: AC
  Administered 2015-03-01: 0.5 mg via INTRAVENOUS

## 2015-03-01 NOTE — Progress Notes (Signed)
Medical Oncology  Urine protein 100 mg on dipstick 10-3 and same on repeat 10-4, equivalent to 2+. Had 24 hour urine with 770 mg/ 24 hrs on 02-09-15 prior to last avastin on 02-10-15.  Per drug information, no recommendations for dipstick, only hold if over 2 grams in 24 hours.  Per [facility] pharmacist, 2+ on dipstick is physician discretion for 24 hour urine/ holding avastin.  Definitely need 24 hour urine if higher on dipstick.  Will hold avastin on 10-5, will give IVF and neulasta that day as planned. Will repeat 24 hour urine for total protein sometime between ~ 10-17 and 10-20.   POF sent and infusion notified. Orders adjusted for 03-02-15.  Godfrey Pick, MD

## 2015-03-01 NOTE — Patient Instructions (Signed)
[facility] Discharge Instructions for Patients Receiving Chemotherapy  Today you received the following chemotherapy agents Taxol/Cisplatin.  To help prevent nausea and vomiting after your treatment, we encourage you to take your nausea medication as directed.   If you develop nausea and vomiting that is not controlled by your nausea medication, call the clinic.   BELOW ARE SYMPTOMS THAT SHOULD BE REPORTED IMMEDIATELY:  *FEVER GREATER THAN 100.5 F  *CHILLS WITH OR WITHOUT FEVER  NAUSEA AND VOMITING THAT IS NOT CONTROLLED WITH YOUR NAUSEA MEDICATION  *UNUSUAL SHORTNESS OF BREATH  *UNUSUAL BRUISING OR BLEEDING  TENDERNESS IN MOUTH AND THROAT WITH OR WITHOUT PRESENCE OF ULCERS  *URINARY PROBLEMS  *BOWEL PROBLEMS  UNUSUAL RASH Items with * indicate a potential emergency and should be followed up as soon as possible.  Feel free to call the clinic you have any questions or concerns. The clinic phone number is [phone].  Please show the [place] at check-in to the Emergency Department and triage nurse.

## 2015-03-01 NOTE — Telephone Encounter (Signed)
Per 10/4 pof 10/20 has been cancelled,michelle aware of chemo change 10/5

## 2015-03-01 NOTE — Progress Notes (Unsigned)
Per [name] okay to obtain additional urine protein at end of today's infusion to see if pt can receive Avastin treatment tomorrow as originally scheduled.  Urine sample obtained and results came back as < 30.  Spoke with [name] who said it was okay to treat tomorrow with Avastin d/t urine results.  Pt informed of plan and in agreement.  Also, okay given per [name] to run remaining hydration fluids at 545mL/hr d/t great urine production throughout infusion.

## 2015-03-02 ENCOUNTER — Ambulatory Visit (HOSPITAL_BASED_OUTPATIENT_CLINIC_OR_DEPARTMENT_OTHER): Payer: BC Managed Care – PPO

## 2015-03-02 VITALS — BP 147/69 | HR 57 | Temp 98.3°F | Resp 18

## 2015-03-02 DIAGNOSIS — Z5189 Encounter for other specified aftercare: Secondary | ICD-10-CM

## 2015-03-02 DIAGNOSIS — Z5112 Encounter for antineoplastic immunotherapy: Secondary | ICD-10-CM | POA: Diagnosis not present

## 2015-03-02 DIAGNOSIS — C530 Malignant neoplasm of endocervix: Secondary | ICD-10-CM

## 2015-03-02 DIAGNOSIS — C7989 Secondary malignant neoplasm of other specified sites: Secondary | ICD-10-CM

## 2015-03-02 DIAGNOSIS — C539 Malignant neoplasm of cervix uteri, unspecified: Secondary | ICD-10-CM

## 2015-03-02 MED ORDER — PEGFILGRASTIM 6 MG/0.6ML ~~LOC~~ PSKT
6.0000 mg | PREFILLED_SYRINGE | Freq: Once | SUBCUTANEOUS | Status: AC
  Administered 2015-03-02: 6 mg via SUBCUTANEOUS
  Filled 2015-03-02: qty 0.6

## 2015-03-02 MED ORDER — SODIUM CHLORIDE 0.9 % IV SOLN
14.8000 mg/kg | Freq: Once | INTRAVENOUS | Status: AC
  Administered 2015-03-02: 1000 mg via INTRAVENOUS
  Filled 2015-03-02: qty 40

## 2015-03-02 MED ORDER — PEGFILGRASTIM INJECTION 6 MG/0.6ML ~~LOC~~
6.0000 mg | PREFILLED_SYRINGE | Freq: Once | SUBCUTANEOUS | Status: DC
  Filled 2015-03-02: qty 0.6

## 2015-03-02 MED ORDER — ACETAMINOPHEN 325 MG PO TABS
650.0000 mg | ORAL_TABLET | Freq: Once | ORAL | Status: AC
  Administered 2015-03-02: 650 mg via ORAL

## 2015-03-02 MED ORDER — ACETAMINOPHEN 325 MG PO TABS
ORAL_TABLET | ORAL | Status: AC
  Filled 2015-03-02: qty 2

## 2015-03-02 MED ORDER — SODIUM CHLORIDE 0.9 % IV SOLN
Freq: Once | INTRAVENOUS | Status: AC
  Administered 2015-03-02: 11:00:00 via INTRAVENOUS

## 2015-03-02 NOTE — Patient Instructions (Signed)
[facility] Discharge Instructions for Patients Receiving Chemotherapy  Today you received the following chemotherapy agents: Avastin  To help prevent nausea and vomiting after your treatment, we encourage you to take your nausea medication as prescribed by your physician.   If you develop nausea and vomiting that is not controlled by your nausea medication, call the clinic.   BELOW ARE SYMPTOMS THAT SHOULD BE REPORTED IMMEDIATELY:  *FEVER GREATER THAN 100.5 F  *CHILLS WITH OR WITHOUT FEVER  NAUSEA AND VOMITING THAT IS NOT CONTROLLED WITH YOUR NAUSEA MEDICATION  *UNUSUAL SHORTNESS OF BREATH  *UNUSUAL BRUISING OR BLEEDING  TENDERNESS IN MOUTH AND THROAT WITH OR WITHOUT PRESENCE OF ULCERS  *URINARY PROBLEMS  *BOWEL PROBLEMS  UNUSUAL RASH Items with * indicate a potential emergency and should be followed up as soon as possible.  Feel free to call the clinic you have any questions or concerns. The clinic phone number is [phone].  Please show the CHEMO ALERT CARD at check-in to the Emergency Department and triage nurse.   

## 2015-03-02 NOTE — Progress Notes (Signed)
Late entry for 1000: Pt reported feeling "jittery" likely related to steroids taken on 10/4. Pt reported resolution of this approximately 1 hour into IV fluids.

## 2015-03-03 ENCOUNTER — Ambulatory Visit: Payer: BC Managed Care – PPO | Admitting: Oncology

## 2015-03-03 ENCOUNTER — Other Ambulatory Visit: Payer: BC Managed Care – PPO

## 2015-03-07 ENCOUNTER — Encounter (HOSPITAL_COMMUNITY): Payer: Self-pay

## 2015-03-15 ENCOUNTER — Other Ambulatory Visit: Payer: Self-pay

## 2015-03-15 DIAGNOSIS — K219 Gastro-esophageal reflux disease without esophagitis: Secondary | ICD-10-CM

## 2015-03-15 MED ORDER — PANTOPRAZOLE SODIUM 40 MG PO TBEC: 40.0000 mg | DELAYED_RELEASE_TABLET | Freq: Every day | ORAL | Status: DC

## 2015-03-17 ENCOUNTER — Ambulatory Visit: Payer: BC Managed Care – PPO | Admitting: Oncology

## 2015-03-17 ENCOUNTER — Other Ambulatory Visit: Payer: BC Managed Care – PPO

## 2015-03-18 ENCOUNTER — Other Ambulatory Visit: Payer: Self-pay

## 2015-03-18 DIAGNOSIS — C539 Malignant neoplasm of cervix uteri, unspecified: Secondary | ICD-10-CM

## 2015-03-21 ENCOUNTER — Other Ambulatory Visit: Payer: Self-pay | Admitting: Oncology

## 2015-03-21 ENCOUNTER — Other Ambulatory Visit (HOSPITAL_COMMUNITY)
Admission: RE | Admit: 2015-03-21 | Discharge: 2015-03-21 | Disposition: A | Discharge status: Home / Self Care | Payer: BC Managed Care – PPO | Source: Other Acute Inpatient Hospital | Attending: Oncology | Admitting: Oncology

## 2015-03-21 ENCOUNTER — Encounter: Payer: Self-pay | Admitting: Oncology

## 2015-03-21 ENCOUNTER — Other Ambulatory Visit (HOSPITAL_BASED_OUTPATIENT_CLINIC_OR_DEPARTMENT_OTHER): Payer: BC Managed Care – PPO

## 2015-03-21 ENCOUNTER — Ambulatory Visit (HOSPITAL_BASED_OUTPATIENT_CLINIC_OR_DEPARTMENT_OTHER): Payer: BC Managed Care – PPO | Admitting: Oncology

## 2015-03-21 VITALS — BP 136/70 | HR 103 | Temp 98.4°F | Resp 18 | Ht 64.0 in | Wt 153.2 lb

## 2015-03-21 DIAGNOSIS — C539 Malignant neoplasm of cervix uteri, unspecified: Secondary | ICD-10-CM

## 2015-03-21 DIAGNOSIS — Z79899 Other long term (current) drug therapy: Secondary | ICD-10-CM

## 2015-03-21 DIAGNOSIS — C7989 Secondary malignant neoplasm of other specified sites: Secondary | ICD-10-CM

## 2015-03-21 DIAGNOSIS — N390 Urinary tract infection, site not specified: Secondary | ICD-10-CM | POA: Insufficient documentation

## 2015-03-21 DIAGNOSIS — R509 Fever, unspecified: Secondary | ICD-10-CM

## 2015-03-21 DIAGNOSIS — C530 Malignant neoplasm of endocervix: Secondary | ICD-10-CM

## 2015-03-21 DIAGNOSIS — N3945 Continuous leakage: Secondary | ICD-10-CM

## 2015-03-21 DIAGNOSIS — G620 Drug-induced polyneuropathy: Secondary | ICD-10-CM

## 2015-03-21 LAB — COMPREHENSIVE METABOLIC PANEL (CC13)
ANION GAP: 6 meq/L (ref 3–11)
AST: 10 U/L (ref 5–34)
Albumin: 2.8 g/dL — ABNORMAL LOW (ref 3.5–5.0)
Alkaline Phosphatase: 70 U/L (ref 40–150)
BUN: 16 mg/dL (ref 7.0–26.0)
CHLORIDE: 101 meq/L (ref 98–109)
CO2: 29 meq/L (ref 22–29)
CREATININE: 1.1 mg/dL (ref 0.6–1.1)
Calcium: 9.6 mg/dL (ref 8.4–10.4)
EGFR: 57 mL/min/{1.73_m2} — ABNORMAL LOW (ref >90)
Glucose: 118 mg/dl (ref 70–140)
POTASSIUM: 4.3 meq/L (ref 3.5–5.1)
Sodium: 136 mEq/L (ref 136–145)
Total Bilirubin: <0.3 mg/dL (ref 0.20–1.20)
Total Protein: 6.8 g/dL (ref 6.4–8.3)

## 2015-03-21 LAB — URINALYSIS, ROUTINE W REFLEX MICROSCOPIC
GLUCOSE, UA: NEGATIVE mg/dL
Hgb urine dipstick: NEGATIVE
Ketones, ur: NEGATIVE mg/dL
NITRITE: POSITIVE — AB
PROTEIN: 30 mg/dL — AB
Specific Gravity, Urine: 1.011 (ref 1.005–1.030)
Urobilinogen, UA: 4 mg/dL — ABNORMAL HIGH (ref 0.0–1.0)
pH: 5.5 (ref 5.0–8.0)

## 2015-03-21 LAB — CBC WITH DIFFERENTIAL/PLATELET
BASO%: 0.3 % (ref 0.0–2.0)
Basophils Absolute: 0 10*3/uL (ref 0.0–0.1)
EOS%: 0.5 % (ref 0.0–7.0)
Eosinophils Absolute: 0.1 10*3/uL (ref 0.0–0.5)
HEMATOCRIT: 33 % — AB (ref 34.8–46.6)
HEMOGLOBIN: 10.9 g/dL — AB (ref 11.6–15.9)
LYMPH#: 0.7 10*3/uL — AB (ref 0.9–3.3)
LYMPH%: 5.6 % — ABNORMAL LOW (ref 14.0–49.7)
MCH: 33.7 pg (ref 25.1–34.0)
MCHC: 33.2 g/dL (ref 31.5–36.0)
MCV: 101.5 fL — ABNORMAL HIGH (ref 79.5–101.0)
MONO#: 1.4 10*3/uL — AB (ref 0.1–0.9)
MONO%: 12.3 % (ref 0.0–14.0)
NEUT%: 81.3 % — ABNORMAL HIGH (ref 38.4–76.8)
NEUTROS ABS: 9.6 10*3/uL — AB (ref 1.5–6.5)
PLATELETS: 368 10*3/uL (ref 145–400)
RBC: 3.25 10*6/uL — ABNORMAL LOW (ref 3.70–5.45)
RDW: 15.9 % — ABNORMAL HIGH (ref 11.2–14.5)
WBC: 11.8 10*3/uL — AB (ref 3.9–10.3)

## 2015-03-21 LAB — URINE MICROSCOPIC-ADD ON

## 2015-03-21 LAB — UA PROTEIN, DIPSTICK - CHCC

## 2015-03-21 MED ORDER — LORAZEPAM 1 MG PO TABS
ORAL_TABLET | ORAL | Status: DC
Start: 2015-03-21 — End: 2015-05-11

## 2015-03-21 MED ORDER — SODIUM CHLORIDE 0.9 % IV SOLN: INTRAVENOUS | Status: DC

## 2015-03-21 MED ORDER — DEXAMETHASONE 4 MG PO TABS: ORAL_TABLET | ORAL | Status: DC

## 2015-03-21 MED ORDER — ONDANSETRON 8 MG PO TBDP: 8.0000 mg | ORAL_TABLET | Freq: Three times a day (TID) | ORAL | Status: DC | PRN

## 2015-03-21 NOTE — Progress Notes (Signed)
OFFICE PROGRESS NOTE   March 21, 2015   Physicians:Emma Carita Pian, MD, Michel Bickers, Leonidas Romberg, Genia Del), Marguerita Merles, Janyth Pupa  INTERVAL HISTORY:   Patient is seen, together with sister in law, as she continues treatment for metastatic cervical cancer involving pelvis and retroperitoneum/ cecal area/ mesenteric nodes. She had restaging CT 02-21-15 after cycle 4 CDDP taxol/ cycle 3 avastin, then saw Dr [name] on 02-24-15. CT showed partial response, with recommendation to continue treatment for another 3 cycles then reimage including PET.  Treatment planned this week will be held with possible UTI and other new symptoms. Note she did not notify this office of problems prior to scheduled late afternoon appointment today (timing of appointment due to her work schedule).  She had cystoscopy, bladder biopsy with fulguration, right diagnostic ureteroscopy,right retrograde pyelogram, and right ureteral stent exchange by Dr Junious Silk on 02-18-15. 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  She had cycle 5 chemo/ cycle 4 avastin on 03-01-15, tolerated adequately, not as much nausea following that treatment.  She felt fairly well following stent change and that treatment until 03-16-15, when she began with urinary frequency. She subsequently developed low pelvic or bladder discomfort without bleeding, and intermittent fevers up to 100, apparently no shaking chills. She denies flank discomfort. Appetite has been poor, po fluid intake probably not adequate, some nausea, vomited this AM and x2 yesterday. She contacted physician on call (for urology?) over weekend, started on cipro on 10-23 planned for 5 days. She has not had urine culture sent. Symptoms have not improved with first couple of doses of  cipro thus far. She also began pyridium. She feels that she has new urinary incontinence or possibly vaginal drainage when she changes position, using pads. The drainage or leaking urine is orange (pyridium).   NOTE urosepsis and right perinephric fluid collection requiring percutaneous drainage in 09-2014. CT 02-21-15 "resolving subcapsular fluid collection".  No PAC Right JJ stent placed 10-13-14 Flu vaccine not done today with UTI and low temp.  She did not feel well enough to work today.  ONCOLOGIC HISTORY Patient had been in usual good health, with no previous abnormal PAP smears, when she developed frequent urination and vaginal bleeding in late 2015. She  had endometrial biopsy by Dr Janyth Pupa [phone]) with adenocarcinoma. PET in [place] system 01-03-97 had hypermetabolic uptake within the uterine cervix and lower uterine segment and metastatic adenopathy within the bilateral pelvis and periaortic retroperitoneum. Dr Genia Del and Dr Marguerita Merles treated with pelvic and para-aortic radiation given with 6 cycles of sensitizing CDDP, and HDR x5 from 07-06-14 thru 09-03-14. Patient had a difficult time with nausea, vomiting, diarrhea and hypokalemia/ hypomagnesemia thru treatment. Patient used ODT zofran and ativan during chemotherapy. She had a difficult time doing oral prehydration for CDDP. She had no peripheral neuropathy with the CDDP.  There was concern by completion of the treatment that she did not have optimal response, with plan for repeat evaluation at 6 weeks from completion of the treatment. Prior to planned reevaluation, patient began having daily fevers for possibly 4 weeks, as high as 102 degrees x 2 weeks prior to 10-11-14; by presentation at ED she also had increased right flank pain. She had CT at [facility] on 10-08-14, this compared with PET of 05-2014 showed increase in subcapsular fluid collection  right kidney and right hydronephrosis, increase in  size of endometrial canal/ uterus/ right adnexa, and improvement in retroperitoneal, common iliac and bilateral pelvic sidewall adenopathy. She was admitted to [facility] ICU  5-16/ 10-12-14 with sepsis from peptostreptococcus. She had percutaneous drainage by IR of 635 cc dark thin fluid from right perinephric area on 10-12-14, with perinephric drain removed by urology on 10-18-14. She had right ureteral stent placement by Dr Junious Silk on 10-13-14. She developed C.difficile diarrhea, begun on flagyl 10-15-14 x 2 weeks, also on ceftin during that time. She saw Dr [name] in consultation on 11-05-14, with cervix grossly normal appearance, firm to palpation with right anterior extension, and purulent material from cervical os with manipulation, no palpable adnexal masses, induration R>L parametrium. PET 06-29-00 had hypermetabolic tissue thickening at cecum, + small right sided mesenteric and inguinal nodes, foci in myometriium and in region of cervix, small right perinephric fluid collection and dilated fluid filled endometrium. Dr [name] saw her following the PET, with recommendation for systemic treatment of the metastatic disease with CDDP, taxol and avastin. First CDDP Taxol 11-19-14, neutropenic day 14. Repeat CT showed essentially stable, minimal right perinephric fluid on 12-06-14, prior to cycle 2 CDDP taxol 7-14 with first avastin 12-10-14 and neulasta. CT AP 02-21-15 after 4 cycles CDDP taxol/ 3 cycles avastin showed improvement, plan to continue x another 3 cycles and reevaluate.    Review of systems as above, also: Bowels are moving without diarrhea. No SOB or respiratory symptoms. Too uncomfortable to sleep well last pm. Remainder of 10 point Review of Systems negative.  Objective:  Vital signs in last 24 hours:  BP 136/70 mmHg  Pulse 103  Temp(Src) 98.4 F (36.9 C) (Oral)  Resp 18  Ht 5' 4"  (1.626 m)  Wt 153 lb 3.2 oz (69.491 kg)  BMI 26.28 kg/m2  SpO2 99%  LMP 11/05/2011 Drinking water during  visit, with encouragement.  Alert, oriented and appropriate. Ambulatory without assistance.  Alopecia  HEENT:PERRL, sclerae not icteric. Oral mucosa somewhat dry without lesions, posterior pharynx clear.  Neck supple. No JVD.  Lymphatics:no cervical,supraclavicular adenopathy Resp: clear to auscultation bilaterally and normal percussion bilaterally Cardio: regular rate and rhythm. No gallop. GI: soft, nontender, not distended, no mass or organomegaly. Some bowel sounds. Musculoskeletal/ Extremities: without pitting edema, cords, tenderness Neuro: no increased peripheral neuropathy. Otherwise nonfocal. PSYCH appropriate mood and affect Skin without rash, ecchymosis, petechiae   Lab Results:  Results for orders placed or performed in visit on 03/21/15  CBC with Differential  Result Value Ref Range   WBC 11.8 (H) 3.9 - 10.3 10e3/uL   NEUT# 9.6 (H) 1.5 - 6.5 10e3/uL   HGB 10.9 (L) 11.6 - 15.9 g/dL   HCT 33.0 (L) 34.8 - 46.6 %   Platelets 368 145 - 400 10e3/uL   MCV 101.5 (H) 79.5 - 101.0 fL   MCH 33.7 25.1 - 34.0 pg   MCHC 33.2 31.5 - 36.0 g/dL   RBC 3.25 (L) 3.70 - 5.45 10e6/uL   RDW 15.9 (H) 11.2 - 14.5 %   lymph# 0.7 (L) 0.9 - 3.3 10e3/uL   MONO# 1.4 (H) 0.1 - 0.9 10e3/uL   Eosinophils Absolute 0.1 0.0 - 0.5 10e3/uL   Basophils Absolute 0.0 0.0 - 0.1 10e3/uL   NEUT% 81.3 (H) 38.4 - 76.8 %   LYMPH% 5.6 (L) 14.0 - 49.7 %   MONO% 12.3 0.0 - 14.0 %   EOS% 0.5 0.0 - 7.0 %   BASO% 0.3 0.0 -  2.0 %  Comprehensive metabolic panel (Cmet) - [facility]  Result Value Ref Range   Sodium 136 136 - 145 mEq/L   Potassium 4.3 3.5 - 5.1 mEq/L   Chloride 101 98 - 109 mEq/L   CO2 29 22 - 29 mEq/L   Glucose 118 70 - 140 mg/dl   BUN 16.0 7.0 - 26.0 mg/dL   Creatinine 1.1 0.6 - 1.1 mg/dL   Total Bilirubin <0.30 0.20 - 1.20 mg/dL   Alkaline Phosphatase 70 40 - 150 U/L   AST 10 5 - 34 U/L   ALT <9 0 - 55 U/L   Total Protein 6.8 6.4 - 8.3 g/dL   Albumin 2.8 (L) 3.5 - 5.0 g/dL   Calcium 9.6 8.4 -  10.4 mg/dL   Anion Gap 6 3 - 11 mEq/L   EGFR 57 (L) >90 ml/min/1.73 m2  Urine protein by dipstick - [facility]  Result Value Ref Range   Protein, ur Color Interference Negative- <30 mg/dL   As [facility] lab closed by time MD received information about problems, MD spoke directly with [facility] lab, urine specimen obtained and taken to that lab for UA C&S (on cipro). UA available after visit has specific gravity 1.011, 21-50 WBC, 0-2 RBC. Culture pending  Studies/Results: EXAM: CT ABDOMEN AND PELVIS WITH CONTRAST 02-21-15  COMPARISON: PET-CT dated 11/04/2014. CT abdomen pelvis dated 10/02/2014.  FINDINGS: Lower chest: Lung bases are clear.  Hepatobiliary: Liver is within normal limits. No suspicious/enhancing hepatic lesions.  Status post cholecystectomy. No intrahepatic or extrahepatic ductal dilatation.  Pancreas: Within normal limits.  Spleen: Within normal limits.  Adrenals/Urinary Tract: Adrenal glands are within normal limits.  Right renal atrophy with resolving subcapsular seroma/hematoma and an indwelling right ureteral stent. No hydronephrosis.  Left kidney is notable for mild lateral cortical scarring (series 2/ image 31). No hydronephrosis.  Bladder is mildly thick-walled with nondependent gas and the distal portion of the indwelling right ureteral stent (series 2/image 36).  Stomach/Bowel: Stomach is notable for a tiny hiatal hernia.  Mild wall thickening involving the distal ileum (series 2/image 57), possibly reflecting radiation changes.  No evidence of bowel obstruction.  Appendix is not discretely visualized.  Vascular/Lymphatic: No evidence of abdominal aortic aneurysm.  Small filling defect within the right gonadal vein (series 2/ image 48).  Small residual ileocolic/right abdominal mesenteric lymph nodes measuring 5-6 mm short axis (series 2/ images 44 and 48). Otherwise, no suspicious abdominopelvic lymphadenopathy.  Reproductive: Uterus  is notable for a 3.4 x 3.9 cm calcified intramural left fundal fibroid (series 2/image 67). Again noted is some low density fluid/distension of the endometrial cavity, measuring up to 3.4 cm, likely secondary to cervical stenosis.  Mild soft tissue prominence with hypoenhancement of the cervix (sagittal image 61). While residual thickening is present (series 2/image 34), the overall appearance on CT favors that of treated tumor given lack of enhancement.  Left ovary is within normal limits. Two right ovarian cysts measuring up to 1.6 cm.  Other: Mild pelvic stranding/radiation changes.  No abdominopelvic ascites.  Musculoskeletal: Degenerative changes of the visualized thoracolumbar spine.  Mild superior endplate changes at L3.  IMPRESSION: Mild soft tissue prominence with hypoenhancement of the cervix, poorly evaluated on CT, but favored to reflect treated tumor given lack of enhancement.  Small residual ileocolic/right abdominal mesenteric lymph nodes measuring 5-6 mm short axis, mildly improved. No evidence of distant metastases.  Associated low-density fluid/distention of the endometrial cavity, likely secondary to cervical stenosis. Wall thickening involving the distal ileum,  likely reflecting radiation changes. Mild bladder wall thickening.  Right renal atrophy with resolving subcapsular seroma/hematoma and an indwelling right ureteral stent.  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  Katti, Pelle [facility] B Collected: 02/18/2015 Client: [facility] Accession: [id number] Received: 02/18/2015 Georgette Dover, MDORT OF SURGICAL PATHOLOGY REASON FOR ADDENDUM, AMENDMENT OR CORRECTION: [id number]: AMENDMENT NOTE: Previously reported as: IN SITU UROTHELIAL CARCINOMA (CIS). MUSCULARIS PROPRIA (DETRUSOR MUSCLE) IS NOT PRESENT. ATTENTION: Revised diagnosis report. Previous sign out date:  02/01/2015 (initials: ecj / date: 03/04/2015) FINAL DIAGNOSIS Diagnosis Bladder, biopsy - UROTHELIAL ATYPIA. Microscopic Comment Additional clinical information: The patient was treated with radiation and chemotherapy for metastatic cervical cancer. The atypia could cause by neoplastic dysplasia/urothelial carcinoma in situ versus reactive epithelial changes to radiation therapy. This case has been consulted with Dr Tommie Ard at [facility].  Medications: I have reviewed the patient's current medications. Continue cipro Continue prn antiemetics  DISCUSSION:  Assessment/Plan:  1.recurrent/ progressive adenocarcinoma of cervix: clinical IIB at diagnosis 04-2014 treated initially with radiation and sensitizing cisplatin, subsequent metastatic disease to pelvis and retroperitoneum, with right hydronephrosis. Improvement with CDDP taxol avastin by CT 02-21-15, with 3 additional cycles planned beginning with treatment 03-01-15. Hold treatment this week with urologic symptoms 2. Symptoms of UTI begun on cipro 03-20-15 (Sun) without culture then. Culture sent thru [facility] lab now. Needs to push po fluids and will set up IVF tomorrow. May need to see Dr Junious Silk and/or other urologic imaging. If seems to be leaking urine, may need to evaluate for vesicovaginal fistula.  Urosepsis 09-2014 related to right hydronephrosis and right perinephric fluid collection. Percutaneous drainage of right perinephric fluid collection and right ureteral stent placed. Noncontrast CT stable right perinephric area prior to adding avastin on 12-10-14. Stent changed 02-18-15.  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  4.peripheral IV access still adequate. She did not want PAC prior to start of avastin 5. Multifactorial anemia, transfused earlier in course: continues ferrous fumarate,  hemoglobin a little lower but not obviously symptomatic 6..constipation, hemorrhoidal discomfort: improved. PO magmesium may help also.C difficile colitis also 09-2014. NEEDS STOOL FOR C DIFF IF ANY FURTHER DIARRHEA. 7.intermittent asthma, none x months 9. No flu vaccine yet, needs this when no infectious illness and not on day of treatment. 10.previous hypokalemia and hypomagnesemia due to CDDP: improved on po and IV supplements, follow 11.mammograms 11-2013 12 post cholecystectomy 13. Probable rotator cuff tear left shoulder: with rest of situation she prefers to treat conservatively for present 14.Intolerance to morphine and codeine causing nausea  All questions answered. Patient and family member understand that she should go to ED tonight if worse including high fever or shaking chills. She will call this office if she does not hear from [place] by mid morning tomorrow re IVF. Will check CBC tomorrow and blood culture.  Cc Drs Rhea Pink. Message to RNs. Time spent 40 min including >50% counseling and coordination of care.    LIVESAY,LENNIS P, MD   03/21/2015, 8:07 PM

## 2015-03-22 ENCOUNTER — Ambulatory Visit: Payer: BC Managed Care – PPO

## 2015-03-22 ENCOUNTER — Other Ambulatory Visit: Payer: Self-pay | Admitting: Oncology

## 2015-03-22 ENCOUNTER — Telehealth: Payer: Self-pay

## 2015-03-22 ENCOUNTER — Ambulatory Visit (HOSPITAL_BASED_OUTPATIENT_CLINIC_OR_DEPARTMENT_OTHER): Payer: BC Managed Care – PPO

## 2015-03-22 ENCOUNTER — Telehealth: Payer: Self-pay | Admitting: *Deleted

## 2015-03-22 VITALS — BP 141/75 | HR 93 | Temp 98.9°F | Resp 20

## 2015-03-22 DIAGNOSIS — R110 Nausea: Secondary | ICD-10-CM | POA: Diagnosis not present

## 2015-03-22 DIAGNOSIS — N1330 Unspecified hydronephrosis: Secondary | ICD-10-CM | POA: Diagnosis not present

## 2015-03-22 DIAGNOSIS — R509 Fever, unspecified: Secondary | ICD-10-CM

## 2015-03-22 DIAGNOSIS — C530 Malignant neoplasm of endocervix: Secondary | ICD-10-CM | POA: Diagnosis not present

## 2015-03-22 DIAGNOSIS — C7989 Secondary malignant neoplasm of other specified sites: Secondary | ICD-10-CM

## 2015-03-22 LAB — CBC WITH DIFFERENTIAL/PLATELET
BASO%: 0.4 % (ref 0.0–2.0)
Basophils Absolute: 0.1 10*3/uL (ref 0.0–0.1)
EOS%: 0.6 % (ref 0.0–7.0)
Eosinophils Absolute: 0.1 10*3/uL (ref 0.0–0.5)
HCT: 31.8 % — ABNORMAL LOW (ref 34.8–46.6)
HGB: 10.5 g/dL — ABNORMAL LOW (ref 11.6–15.9)
LYMPH%: 5.2 % — AB (ref 14.0–49.7)
MCH: 33.2 pg (ref 25.1–34.0)
MCHC: 33.1 g/dL (ref 31.5–36.0)
MCV: 100.4 fL (ref 79.5–101.0)
MONO#: 1.6 10*3/uL — AB (ref 0.1–0.9)
MONO%: 13.3 % (ref 0.0–14.0)
NEUT#: 9.8 10*3/uL — ABNORMAL HIGH (ref 1.5–6.5)
NEUT%: 80.5 % — AB (ref 38.4–76.8)
PLATELETS: 390 10*3/uL (ref 145–400)
RBC: 3.17 10*6/uL — ABNORMAL LOW (ref 3.70–5.45)
RDW: 16 % — ABNORMAL HIGH (ref 11.2–14.5)
WBC: 12.1 10*3/uL — ABNORMAL HIGH (ref 3.9–10.3)
lymph#: 0.6 10*3/uL — ABNORMAL LOW (ref 0.9–3.3)

## 2015-03-22 MED ORDER — SODIUM CHLORIDE 0.9 % IV SOLN
INTRAVENOUS | Status: DC
Start: 2015-03-22 — End: 2015-03-22
  Administered 2015-03-22: 14:00:00 via INTRAVENOUS

## 2015-03-22 NOTE — Telephone Encounter (Signed)
Pt is set up for lab at 1330 and infusion at 1400. She is doing "about the same". She is trying to drink more, she has had 2 bottles of water this morning. Her LBM was small on 10/23 so she took a laxative this morning. She is still having pelvic pain. She runs a low grade fever that goes away with tylenol.

## 2015-03-22 NOTE — Telephone Encounter (Signed)
Re IVF today: Thanks for setting this up  When pateint comes, please let her know that the urine studies were managed thru hospital lab, culture pending (may not show the infection as she is on antibiotics). The UA had WBCs, which can be seen with infection or from the stent.  Please have infusion RN page me when patient is evaluated, so I can talk with that RN by phone  [phone]  thanks

## 2015-03-22 NOTE — Telephone Encounter (Signed)
-----   Message from Gordy Levan, MD sent at 03/21/2015  8:27 PM EDT ----- Patient seen after 4:30 on 10-24  Needs IVF and blood culture on 10-25.  She is to call if she doesn't hear from [place] by 0930 on 10-25  Cancelled Rx 10-27 and 10-28  May need other orders. Please let me know how she is  thanks

## 2015-03-22 NOTE — Progress Notes (Signed)
Spoke with [name] via telephone. MD instructed nurse to have patient call center tomorrow  With update of  Status of fever and nausea and vomiting. Pt instructed on calling center with update or to call center at once if fever and nausea persists or increases. Pt verbalized understand of instructions.

## 2015-03-22 NOTE — Telephone Encounter (Signed)
Per staff message and POF I have scheduled appts. Advised scheduler of appts. JMW  

## 2015-03-23 ENCOUNTER — Encounter (HOSPITAL_COMMUNITY): Payer: Self-pay

## 2015-03-23 ENCOUNTER — Emergency Department (HOSPITAL_COMMUNITY): Payer: BC Managed Care – PPO

## 2015-03-23 ENCOUNTER — Other Ambulatory Visit: Payer: Self-pay

## 2015-03-23 ENCOUNTER — Inpatient Hospital Stay (HOSPITAL_COMMUNITY)
Admission: EM | Admit: 2015-03-23 | Discharge: 2015-03-28 | DRG: 854 | Disposition: A | Discharge status: Home / Self Care | Payer: BC Managed Care – PPO | Attending: Internal Medicine | Admitting: Internal Medicine

## 2015-03-23 ENCOUNTER — Telehealth: Payer: Self-pay | Admitting: *Deleted

## 2015-03-23 DIAGNOSIS — R32 Unspecified urinary incontinence: Secondary | ICD-10-CM | POA: Diagnosis present

## 2015-03-23 DIAGNOSIS — Z419 Encounter for procedure for purposes other than remedying health state, unspecified: Secondary | ICD-10-CM

## 2015-03-23 DIAGNOSIS — D261 Other benign neoplasm of corpus uteri: Secondary | ICD-10-CM | POA: Diagnosis present

## 2015-03-23 DIAGNOSIS — N1330 Unspecified hydronephrosis: Secondary | ICD-10-CM | POA: Diagnosis present

## 2015-03-23 DIAGNOSIS — C779 Secondary and unspecified malignant neoplasm of lymph node, unspecified: Secondary | ICD-10-CM | POA: Diagnosis present

## 2015-03-23 DIAGNOSIS — M129 Arthropathy, unspecified: Secondary | ICD-10-CM | POA: Diagnosis present

## 2015-03-23 DIAGNOSIS — K2970 Gastritis, unspecified, without bleeding: Secondary | ICD-10-CM | POA: Diagnosis present

## 2015-03-23 DIAGNOSIS — Z79899 Other long term (current) drug therapy: Secondary | ICD-10-CM

## 2015-03-23 DIAGNOSIS — C7951 Secondary malignant neoplasm of bone: Secondary | ICD-10-CM | POA: Diagnosis present

## 2015-03-23 DIAGNOSIS — C538 Malignant neoplasm of overlapping sites of cervix uteri: Secondary | ICD-10-CM | POA: Diagnosis not present

## 2015-03-23 DIAGNOSIS — Z791 Long term (current) use of non-steroidal anti-inflammatories (NSAID): Secondary | ICD-10-CM

## 2015-03-23 DIAGNOSIS — A419 Sepsis, unspecified organism: Secondary | ICD-10-CM | POA: Diagnosis not present

## 2015-03-23 DIAGNOSIS — C786 Secondary malignant neoplasm of retroperitoneum and peritoneum: Secondary | ICD-10-CM | POA: Diagnosis present

## 2015-03-23 DIAGNOSIS — N390 Urinary tract infection, site not specified: Secondary | ICD-10-CM | POA: Diagnosis present

## 2015-03-23 DIAGNOSIS — Z7952 Long term (current) use of systemic steroids: Secondary | ICD-10-CM

## 2015-03-23 DIAGNOSIS — Z9049 Acquired absence of other specified parts of digestive tract: Secondary | ICD-10-CM

## 2015-03-23 DIAGNOSIS — N719 Inflammatory disease of uterus, unspecified: Secondary | ICD-10-CM

## 2015-03-23 DIAGNOSIS — J4520 Mild intermittent asthma, uncomplicated: Secondary | ICD-10-CM | POA: Diagnosis present

## 2015-03-23 DIAGNOSIS — Z923 Personal history of irradiation: Secondary | ICD-10-CM

## 2015-03-23 DIAGNOSIS — R509 Fever, unspecified: Secondary | ICD-10-CM | POA: Diagnosis not present

## 2015-03-23 DIAGNOSIS — K2990 Gastroduodenitis, unspecified, without bleeding: Secondary | ICD-10-CM | POA: Diagnosis not present

## 2015-03-23 DIAGNOSIS — I959 Hypotension, unspecified: Secondary | ICD-10-CM | POA: Diagnosis present

## 2015-03-23 DIAGNOSIS — D6481 Anemia due to antineoplastic chemotherapy: Secondary | ICD-10-CM | POA: Diagnosis present

## 2015-03-23 DIAGNOSIS — R3915 Urgency of urination: Secondary | ICD-10-CM | POA: Diagnosis present

## 2015-03-23 DIAGNOSIS — Z885 Allergy status to narcotic agent status: Secondary | ICD-10-CM

## 2015-03-23 DIAGNOSIS — K210 Gastro-esophageal reflux disease with esophagitis: Secondary | ICD-10-CM | POA: Diagnosis present

## 2015-03-23 DIAGNOSIS — Z960 Presence of urogenital implants: Secondary | ICD-10-CM | POA: Diagnosis present

## 2015-03-23 DIAGNOSIS — Z9221 Personal history of antineoplastic chemotherapy: Secondary | ICD-10-CM

## 2015-03-23 DIAGNOSIS — G620 Drug-induced polyneuropathy: Secondary | ICD-10-CM | POA: Diagnosis present

## 2015-03-23 DIAGNOSIS — D500 Iron deficiency anemia secondary to blood loss (chronic): Secondary | ICD-10-CM

## 2015-03-23 DIAGNOSIS — D539 Nutritional anemia, unspecified: Secondary | ICD-10-CM | POA: Diagnosis present

## 2015-03-23 DIAGNOSIS — C539 Malignant neoplasm of cervix uteri, unspecified: Secondary | ICD-10-CM | POA: Diagnosis present

## 2015-03-23 DIAGNOSIS — E860 Dehydration: Secondary | ICD-10-CM | POA: Diagnosis present

## 2015-03-23 DIAGNOSIS — T451X5A Adverse effect of antineoplastic and immunosuppressive drugs, initial encounter: Secondary | ICD-10-CM | POA: Diagnosis present

## 2015-03-23 DIAGNOSIS — E871 Hypo-osmolality and hyponatremia: Secondary | ICD-10-CM | POA: Diagnosis present

## 2015-03-23 HISTORY — DX: Malignant neoplasm of cervix uteri, unspecified: C53.9

## 2015-03-23 LAB — I-STAT CG4 LACTIC ACID, ED
Lactic Acid, Venous: 2.06 mmol/L (ref 0.5–2.0)
Lactic Acid, Venous: 0.79 mmol/L (ref 0.5–2.0)

## 2015-03-23 LAB — URINE CULTURE: Culture: NO GROWTH

## 2015-03-23 LAB — COMPREHENSIVE METABOLIC PANEL
ALBUMIN: 3.3 g/dL — AB (ref 3.5–5.0)
ALT: 12 U/L — AB (ref 14–54)
AST: 19 U/L (ref 15–41)
Alkaline Phosphatase: 69 U/L (ref 38–126)
Anion gap: 10 (ref 5–15)
BUN: 14 mg/dL (ref 6–20)
CHLORIDE: 97 mmol/L — AB (ref 101–111)
CO2: 26 mmol/L (ref 22–32)
CREATININE: 1.11 mg/dL — AB (ref 0.44–1.00)
Calcium: 9.2 mg/dL (ref 8.9–10.3)
GFR calc Af Amer: >60 mL/min (ref >60)
GFR calc non Af Amer: 55 mL/min — ABNORMAL LOW (ref >60)
GLUCOSE: 115 mg/dL — AB (ref 65–99)
Potassium: 3.5 mmol/L (ref 3.5–5.1)
SODIUM: 133 mmol/L — AB (ref 135–145)
Total Bilirubin: 0.9 mg/dL (ref 0.3–1.2)
Total Protein: 7.3 g/dL (ref 6.5–8.1)

## 2015-03-23 LAB — URINALYSIS, ROUTINE W REFLEX MICROSCOPIC
GLUCOSE, UA: NEGATIVE mg/dL
Ketones, ur: 15 mg/dL — AB
Nitrite: POSITIVE — AB
PROTEIN: 100 mg/dL — AB
Specific Gravity, Urine: 1.012 (ref 1.005–1.030)
Urobilinogen, UA: 4 mg/dL — ABNORMAL HIGH (ref 0.0–1.0)
pH: 5.5 (ref 5.0–8.0)

## 2015-03-23 LAB — CBC WITH DIFFERENTIAL/PLATELET
Basophils Absolute: 0 10*3/uL (ref 0.0–0.1)
Basophils Relative: 0 %
EOS ABS: 0 10*3/uL (ref 0.0–0.7)
EOS PCT: 0 %
HCT: 33.4 % — ABNORMAL LOW (ref 36.0–46.0)
HEMOGLOBIN: 10.7 g/dL — AB (ref 12.0–15.0)
LYMPHS ABS: 0.6 10*3/uL — AB (ref 0.7–4.0)
Lymphocytes Relative: 4 %
MCH: 33.3 pg (ref 26.0–34.0)
MCHC: 32 g/dL (ref 30.0–36.0)
MCV: 104 fL — ABNORMAL HIGH (ref 78.0–100.0)
MONOS PCT: 13 %
Monocytes Absolute: 2.1 10*3/uL — ABNORMAL HIGH (ref 0.1–1.0)
NEUTROS PCT: 83 %
Neutro Abs: 13.3 10*3/uL — ABNORMAL HIGH (ref 1.7–7.7)
Platelets: 398 10*3/uL (ref 150–400)
RBC: 3.21 MIL/uL — ABNORMAL LOW (ref 3.87–5.11)
RDW: 15.3 % (ref 11.5–15.5)
WBC: 16.1 10*3/uL — ABNORMAL HIGH (ref 4.0–10.5)

## 2015-03-23 LAB — URINE MICROSCOPIC-ADD ON

## 2015-03-23 LAB — WET PREP, GENITAL
Clue Cells Wet Prep HPF POC: NONE SEEN
TRICH WET PREP: NONE SEEN
Yeast Wet Prep HPF POC: NONE SEEN

## 2015-03-23 MED ORDER — SODIUM CHLORIDE 0.9 % IV BOLUS (SEPSIS)
1000.0000 mL | INTRAVENOUS | Status: AC
  Administered 2015-03-23 (×2): 1000 mL via INTRAVENOUS

## 2015-03-23 MED ORDER — SODIUM CHLORIDE 0.9 % IV SOLN
INTRAVENOUS | Status: DC
Start: 2015-03-23 — End: 2015-03-28
  Administered 2015-03-23 – 2015-03-28 (×10): via INTRAVENOUS

## 2015-03-23 MED ORDER — ACETAMINOPHEN 325 MG PO TABS
650.0000 mg | ORAL_TABLET | Freq: Four times a day (QID) | ORAL | Status: DC | PRN
  Administered 2015-03-23 – 2015-03-25 (×6): 650 mg via ORAL
  Filled 2015-03-23 (×6): qty 2

## 2015-03-23 MED ORDER — IOHEXOL 300 MG/ML  SOLN
100.0000 mL | Freq: Once | INTRAMUSCULAR | Status: AC | PRN
  Administered 2015-03-23: 100 mL via INTRAVENOUS

## 2015-03-23 MED ORDER — ONDANSETRON HCL 4 MG PO TABS
4.0000 mg | ORAL_TABLET | Freq: Four times a day (QID) | ORAL | Status: DC | PRN
  Administered 2015-03-23 – 2015-03-24 (×2): 4 mg via ORAL
  Filled 2015-03-23 (×2): qty 1

## 2015-03-23 MED ORDER — SODIUM CHLORIDE 0.9 % IJ SOLN
3.0000 mL | Freq: Two times a day (BID) | INTRAMUSCULAR | Status: DC
Start: 2015-03-23 — End: 2015-03-28
  Administered 2015-03-23 – 2015-03-27 (×3): 3 mL via INTRAVENOUS

## 2015-03-23 MED ORDER — ENOXAPARIN SODIUM 40 MG/0.4ML ~~LOC~~ SOLN
40.0000 mg | SUBCUTANEOUS | Status: DC
  Administered 2015-03-23 – 2015-03-24 (×2): 40 mg via SUBCUTANEOUS
  Filled 2015-03-23 (×2): qty 0.4

## 2015-03-23 MED ORDER — PIPERACILLIN-TAZOBACTAM 3.375 G IVPB 30 MIN
3.3750 g | INTRAVENOUS | Status: AC
  Administered 2015-03-23: 3.375 g via INTRAVENOUS
  Filled 2015-03-23: qty 50

## 2015-03-23 MED ORDER — ONDANSETRON HCL 4 MG/2ML IJ SOLN
4.0000 mg | Freq: Four times a day (QID) | INTRAMUSCULAR | Status: DC | PRN
  Administered 2015-03-24 – 2015-03-25 (×2): 4 mg via INTRAVENOUS
  Filled 2015-03-23 (×2): qty 2

## 2015-03-23 MED ORDER — ACETAMINOPHEN 650 MG RE SUPP: 650.0000 mg | Freq: Four times a day (QID) | RECTAL | Status: DC | PRN

## 2015-03-23 MED ORDER — PIPERACILLIN-TAZOBACTAM 3.375 G IVPB 30 MIN: 3.3750 g | Freq: Three times a day (TID) | INTRAVENOUS | Status: DC

## 2015-03-23 MED ORDER — PIPERACILLIN-TAZOBACTAM 3.375 G IVPB
3.3750 g | Freq: Three times a day (TID) | INTRAVENOUS | Status: DC
  Administered 2015-03-23 – 2015-03-28 (×15): 3.375 g via INTRAVENOUS
  Filled 2015-03-23 (×15): qty 50

## 2015-03-23 MED ORDER — IOHEXOL 300 MG/ML  SOLN
25.0000 mL | Freq: Once | INTRAMUSCULAR | Status: AC | PRN
  Administered 2015-03-23: 25 mL via ORAL

## 2015-03-23 MED ORDER — POTASSIUM CHLORIDE CRYS ER 10 MEQ PO TBCR
10.0000 meq | EXTENDED_RELEASE_TABLET | Freq: Every day | ORAL | Status: DC
  Administered 2015-03-23 – 2015-03-25 (×3): 10 meq via ORAL
  Filled 2015-03-23 (×3): qty 1

## 2015-03-23 MED ORDER — SODIUM CHLORIDE 0.9 % IV BOLUS (SEPSIS)
500.0000 mL | INTRAVENOUS | Status: AC
  Administered 2015-03-23: 500 mL via INTRAVENOUS

## 2015-03-23 MED ORDER — ALBUTEROL SULFATE (2.5 MG/3ML) 0.083% IN NEBU: 2.5000 mg | INHALATION_SOLUTION | Freq: Four times a day (QID) | RESPIRATORY_TRACT | Status: DC | PRN

## 2015-03-23 MED ORDER — SODIUM CHLORIDE 0.9 % IV SOLN
INTRAVENOUS | Status: AC
  Administered 2015-03-23: 16:00:00 via INTRAVENOUS

## 2015-03-23 MED ORDER — PANTOPRAZOLE SODIUM 40 MG PO TBEC
40.0000 mg | DELAYED_RELEASE_TABLET | Freq: Every day | ORAL | Status: DC
  Administered 2015-03-23 – 2015-03-28 (×6): 40 mg via ORAL
  Filled 2015-03-23 (×6): qty 1

## 2015-03-23 MED ORDER — ONDANSETRON HCL 4 MG/2ML IJ SOLN: 4.0000 mg | Freq: Three times a day (TID) | INTRAMUSCULAR | Status: DC | PRN

## 2015-03-23 MED ORDER — ACETAMINOPHEN 500 MG PO TABS
1000.0000 mg | ORAL_TABLET | Freq: Once | ORAL | Status: AC
  Administered 2015-03-23: 1000 mg via ORAL
  Filled 2015-03-23: qty 2

## 2015-03-23 NOTE — Telephone Encounter (Signed)
Voicemail from patient reporting "IVF yesterday.  I still have a fever and pain in my lower pelvic.  I will call urologist to see what they say.  I gave a lot of blood yesterday.  Have results come in yet?  Please call me at [phone]." Called patient at [phone].  "My temperature = 101.2 last night and this morning.  I've taken tylenol.  My family is making me go to the hospital right now."  Wished her well and ended call.

## 2015-03-23 NOTE — Progress Notes (Signed)
ANTIBIOTIC CONSULT NOTE - INITIAL  Pharmacy Consult for Zosyn Indication: rule out sepsis  Allergies  Allergen Reactions  . Morphine And Related Rash    Per pt, morphine also makes her vomit  . Codeine Nausea Only  . Hydrocodone Nausea And Vomiting  . Peach [Prunus Persica] Swelling and Rash    Patient Measurements: Height: 5\' 4"  (162.6 cm) Weight: 151 lb (68.493 kg) IBW/kg (Calculated) : 54.7 Adjusted Body Weight:   Vital Signs: Temp: 100 F (37.8 C) (10/26 1032) Temp Source: Oral (10/26 1032) BP: 154/67 mmHg (10/26 1130) Pulse Rate: 108 (10/26 1130) Intake/Output from previous day:   Intake/Output from this shift: Total I/O In: -  Out: 10 [Urine:10]  Labs:  Recent Labs  03/21/15 1534 03/21/15 1534 03/22/15 1342 03/23/15 1104  WBC 11.8*  --  12.1* 16.1*  HGB 10.9*  --  10.5* 10.7*  PLT 368  --  390 398  CREATININE  --  1.1  --  1.11*   Estimated Creatinine Clearance: 55.1 mL/min (by C-G formula based on Cr of 1.11). No results for input(s): VANCOTROUGH, VANCOPEAK, VANCORANDOM, GENTTROUGH, GENTPEAK, GENTRANDOM, TOBRATROUGH, TOBRAPEAK, TOBRARND, AMIKACINPEAK, AMIKACINTROU, AMIKACIN in the last 72 hours.   Microbiology: Recent Results (from the past 720 hour(s))  Urine culture     Status: None (Preliminary result)   Collection Time: 03/21/15  4:45 PM  Result Value Ref Range Status   Specimen Description URINE, RANDOM  Final   Special Requests PATIENT ON FOLLOWING CIPROFLOXACIN  Final   Culture   Final    NO GROWTH < 12 HOURS Performed at [facility]    Report Status PENDING  Incomplete    Medical History: Past Medical History  Diagnosis Date  . Hydronephrosis, right   . Ureteral stricture, right   . History of septic shock     05/ 2016  due to Peptostriptococcus/  right hydronephrosis and obstruction----  resolved  . 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 Mild intermittent asthma   . History of Clostridium difficile      10-15-2014  resolved  . Left rotator cuff tear arthropathy   . White coat syndrome without hypertension   . GERD (gastroesophageal reflux disease)   . Frequency of urination   . Anemia associated with chemotherapy     Antineoplastic chemo  . Recurrent cervical cancer [facility]) oncologist-  dr Rossi/ dr Marko Plume    dx 12/ 2015---  Radiation and Chemo (07-06-2014 to 09-03-2014)  now recurrent ,  Stage IIB  w/ Mets to pelvic/ retroperitoneum---  CHEMOTHERAPY CURRENTLY  . 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 Cervical cancer (HCC)     Medications:  Scheduled:   Assessment: Pt is a 54 yo female with concern for urosepsis. Pt PMH significant for metastatic cervical cancer. Last chemo was about three weeks ago. Next round of chemo has been held due to elevated fevers. Pt was given rx for Cipro which did not improve symptoms. Per notes, no urine culture was obtain prior to rx for Cipro.     10/23 >> Cipro >> 10/26 (outpt) 10/26 >> Zosyn >>    10/26 blood x 2:   10/26 urine:     Goal of Therapy:  Eradication of infection    Plan:  Follow up culture results  Zosyn 3.375 gr IV q8h EI.   Royetta Asal, PharmD, BCPS Pager [phone] 03/23/15 12:49

## 2015-03-23 NOTE — ED Provider Notes (Signed)
CSN: [id number]     Arrival date & time 03/23/15  1026 History   First MD Initiated Contact with Patient 03/23/15 1034     Chief Complaint  Patient presents with  . Ca pt, Fever      (Consider location/radiation/quality/duration/timing/severity/associated sxs/prior Treatment) HPI Comments: The patient is a 54 year old female, she has a history of metastatic cervical cancer to her pelvis and is currently undergoing chemotherapy, last dose was approximately 3 weeks ago, has recently developed fever, difficulty urinating, dehydration and was told that she would not be getting chemotherapy this week because of dehydration according to the patient's report. She has had increasing pain in her pelvis with developing fever as high as 102.5 at home. She has not been on recent antibiotics. The symptoms are persistent, gradually worsening, they are now moderate to severe. Denies coughing, shortness of breath, headache, blurred vision, rashes, diarrhea  Patient is a 55 y.o. female presenting with fever. The history is provided by the patient.  Fever   Past Medical History  Diagnosis Date  . Hydronephrosis, right   . Ureteral stricture, right   . History of septic shock     05/ 2016  due to Peptostriptococcus/  right hydronephrosis and obstruction----  resolved  . 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 Mild intermittent asthma   . History of Clostridium difficile     10-15-2014  resolved  . Left rotator cuff tear arthropathy   . White coat syndrome without hypertension   . GERD (gastroesophageal reflux disease)   . Frequency of urination   . Anemia associated with chemotherapy     Antineoplastic chemo  . Recurrent cervical cancer [facility]) oncologist-  dr Rossi/ dr Marko Plume    dx 12/ 2015---  Radiation and Chemo (07-06-2014 to 09-03-2014)  now recurrent ,  Stage IIB  w/ Mets to pelvic/ retroperitoneum---  CHEMOTHERAPY CURRENTLY  . 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  Cervical cancer [facility])    Past Surgical History  Procedure Laterality Date  . Cystoscopy w/ ureteral stent placement Right 10/13/2014    Procedure: CYSTOSCOPY WITH RETROGRADE PYELOGRAM/URETERAL STENT PLACEMENT;  Surgeon: Festus Aloe, MD;  Location: [facility];  Service: Urology;  Laterality: Right;  . Transthoracic echocardiogram  10-13-2014    mild LVH,  ef 55-60%/  mild TR  . Tonsillectomy and adenoidectomy  age 19    age 46--  redo-- Adenoidectomy  . Laparoscopic cholecystectomy  1990's  . Cystoscopy with ureteroscopy and stent placement Right 02/18/2015    Procedure: CYSTO WITH RIGHT URETERAL STENT EXCHANGE, RIGHT URETEROSCOPY ;  Surgeon: Festus Aloe, MD;  Location: [facility];  Service: Urology;  Laterality: Right;  . Cystoscopy with biopsy N/A 02/18/2015    Procedure: CYSTOSCOPY WITH  BLADDER  BIOPSY;  Surgeon: Festus Aloe, MD;  Location: [facility];  Service: Urology;  Laterality: N/A;  . Cystoscopy w/ retrogrades Right 02/18/2015    Procedure: CYSTOSCOPY WITH RETROGRADE PYELOGRAM;  Surgeon: Festus Aloe, MD;  Location: [facility];  Service: Urology;  Laterality: Right;   No family history on file. Social History  Substance Use Topics  . Smoking status: Never Smoker   . Smokeless tobacco: Never Used  . Alcohol Use: No   OB History    No data available     Review of Systems  Constitutional: Positive for fever.  All other systems reviewed and are negative.     Allergies  Morphine and related; Codeine;  Hydrocodone; and [place]  Home Medications   Prior to Admission medications   Medication Sig Start Date End Date Taking? Authorizing Provider  acetaminophen (TYLENOL) 500 MG tablet Take 1,000 mg by mouth 2 (two) times daily as needed for moderate pain, fever or headache.   Yes Historical Provider, MD  albuterol (PROVENTIL HFA;VENTOLIN HFA) 108 (90 BASE) MCG/ACT inhaler Inhale 2 puffs into the lungs every 6 (six)  hours as needed for wheezing or shortness of breath.   Yes Historical Provider, MD  ciprofloxacin (CIPRO) 500 MG tablet take 1 tablet by mouth twice a day for 5 days 03/20/15 03/24/15 Yes Historical Provider, MD  dexamethasone (DECADRON) 4 MG tablet Take 5 tablets with food 12 hrs and 6 hrs prior to Taxol Chemotherapy 03/21/15  Yes Lennis Marion Downer, MD  diphenoxylate-atropine (LOMOTIL) 2.5-0.025 MG per tablet Take 1 tablet by mouth as needed for diarrhea or loose stools. Can take up to 8 times daily 08/10/14  Yes Historical Provider, MD  docusate sodium (COLACE) 100 MG capsule Take 100 mg by mouth daily as needed for mild constipation.  11/23/14  Yes Historical Provider, MD  HEMOCYTE 324 (106 FE) MG TABS Take 325 mg by mouth daily. Take 1 tablet daily on empty stomach with Vitamin C tablet 11/18/14  Yes Lennis Marion Downer, MD  ibuprofen (ADVIL,MOTRIN) 200 MG tablet Take 400 mg by mouth every 8 (eight) hours as needed for headache or moderate pain.    Yes Historical Provider, MD  lactose free nutrition (BOOST PLUS) LIQD Take 237 mLs by mouth 3 (three) times daily with meals. Patient taking differently: Take 237 mLs by mouth daily.  10/18/14  Yes Thurnell Lose, MD  loratadine (CLARITIN) 10 MG tablet Take 10 mg by mouth daily as needed for allergies.    Yes Historical Provider, MD  LORazepam (ATIVAN) 1 MG tablet Place 1/2 - 1 tablet under the tongue or swallow evry 4-6 hrs as needed for nausea 03/21/15  Yes Lennis Marion Downer, MD  Magnesium Oxide 500 MG CAPS Take 500 mg by mouth daily.   Yes Historical Provider, MD  pantoprazole (PROTONIX) 40 MG tablet Take 1 tablet (40 mg total) by mouth daily. 03/15/15  Yes Lennis Marion Downer, MD  phenazopyridine (PYRIDIUM) 200 MG tablet take 1 tablet by mouth three times a day FOR BURNING 03/20/15  Yes Historical Provider, MD  potassium chloride (K-DUR,KLOR-CON) 10 MEQ tablet Take 10 mEq by mouth daily.   Yes Historical Provider, MD  vitamin C (ASCORBIC ACID) 500 MG tablet  Take 500 mg by mouth daily.   Yes Historical Provider, MD  ondansetron (ZOFRAN-ODT) 8 MG disintegrating tablet Take 1 tablet (8 mg total) by mouth every 8 (eight) hours as needed for nausea or vomiting. 03/21/15   Lennis P Livesay, MD   BP 143/78 mmHg  Pulse 117  Temp(Src) 102.4 F (39.1 C) (Oral)  Resp 14  Ht 5\' 4"  (1.626 m)  Wt 151 lb (68.493 kg)  BMI 25.91 kg/m2  SpO2 96%  LMP 11/05/2011 Physical Exam  Constitutional: She appears well-developed and well-nourished. No distress.  HENT:  Head: Normocephalic and atraumatic.  Mouth/Throat: Oropharynx is clear and moist. No oropharyngeal exudate.  Eyes: Conjunctivae and EOM are normal. Pupils are equal, round, and reactive to light. Right eye exhibits no discharge. Left eye exhibits no discharge. No scleral icterus.  Neck: Normal range of motion. Neck supple. No JVD present. No thyromegaly present.  Cardiovascular: Regular rhythm, normal heart sounds and intact distal pulses.  Exam  reveals no gallop and no friction rub.   No murmur heard. Tachycardia to 115  Pulmonary/Chest: Effort normal and breath sounds normal. No respiratory distress. She has no wheezes. She has no rales.  Abdominal: Soft. Bowel sounds are normal. She exhibits no distension and no mass. There is tenderness ( Lower abdominal pelvic tenderness bilateral and midline, no guarding or peritoneal signs).  Musculoskeletal: Normal range of motion. She exhibits no edema or tenderness.  Lymphadenopathy:    She has no cervical adenopathy.  Neurological: She is alert. Coordination normal.  Skin: Skin is warm and dry. No rash noted. No erythema.  Psychiatric: She has a normal mood and affect. Her behavior is normal.  Nursing note and vitals reviewed.   ED Course  Procedures (including critical care time) Labs Review Labs Reviewed  COMPREHENSIVE METABOLIC PANEL - Abnormal; Notable for the following:    Sodium 133 (*)    Chloride 97 (*)    Glucose, Bld 115 (*)     Creatinine, Ser 1.11 (*)    Albumin 3.3 (*)    ALT 12 (*)    GFR calc non Af Amer 55 (*)    All other components within normal limits  CBC WITH DIFFERENTIAL/PLATELET - Abnormal; Notable for the following:    WBC 16.1 (*)    RBC 3.21 (*)    Hemoglobin 10.7 (*)    HCT 33.4 (*)    MCV 104.0 (*)    Neutro Abs 13.3 (*)    Lymphs Abs 0.6 (*)    Monocytes Absolute 2.1 (*)    All other components within normal limits  URINALYSIS, ROUTINE W REFLEX MICROSCOPIC (NOT AT [facility]) - Abnormal; Notable for the following:    Color, Urine ORANGE (*)    Hgb urine dipstick SMALL (*)    Bilirubin Urine SMALL (*)    Ketones, ur 15 (*)    Protein, ur 100 (*)    Urobilinogen, UA 4.0 (*)    Nitrite POSITIVE (*)    Leukocytes, UA SMALL (*)    All other components within normal limits  URINE MICROSCOPIC-ADD ON - Abnormal; Notable for the following:    Bacteria, UA FEW (*)    All other components within normal limits  I-STAT CG4 LACTIC ACID, ED - Abnormal; Notable for the following:    Lactic Acid, Venous 2.06 (*)    All other components within normal limits  CULTURE, BLOOD (ROUTINE X 2)  CULTURE, BLOOD (ROUTINE X 2)  URINE CULTURE  WET PREP, GENITAL  I-STAT CG4 LACTIC ACID, ED  GC/CHLAMYDIA PROBE AMP ([place]) NOT AT [facility]    Imaging Review Ct Abdomen Pelvis W Contrast  03/23/2015  CLINICAL DATA:  54 year old female with pelvic pain and fever for 6 days. Cervical cancer chemotherapy in progress. Subsequent encounter. EXAM: CT ABDOMEN AND PELVIS WITH CONTRAST TECHNIQUE: Multidetector CT imaging of the abdomen and pelvis was performed using the standard protocol following bolus administration of intravenous contrast. CONTRAST:  133mL OMNIPAQUE IOHEXOL 300 MG/ML  SOLN COMPARISON:  CT Abdomen and Pelvis 02/21/2015 and earlier. FINDINGS: Negative lung bases.  No pericardial or pleural effusion. Stable visualized osseous structures. Stable presacral stranding or trace fluid. Rectum remains within normal  limits. Stable post treatment appearance of the uterus, cervix and vagina compared to September. Stable all appearance of the at adnexa and right gonadal vein. Sigmoid colon remains within normal limits. Retained stool throughout the low left colon and transverse colon. Oral contrast has reached the right colon. There is continued  circumferential wall thickening in the distal ileum looped in the right lower quadrant located adjacent to the uterus and right adnexa (series 2, image 64). The terminal ileum itself appears relatively spared. No dilated small bowel. Decompressed stomach and duodenum. No abdominal free fluid or free air. Surgically absent gallbladder. Liver, spleen, pancreas, and adrenal glands remain within normal limits. Portal venous system is patent. Major arterial structures in the abdomen and pelvis remain patent. Previously identified right lower quadrant mesenteric lymph nodes are stable to mildly regressed (e.g. Series 2, image 44 today versus same series and image previously). No new or increased lymphadenopathy. Both kidneys are stable since September. Mild prominence of the left renal collecting system but preserved delayed contrast excretion. Right double-J ureteral stent remains in place. No gas within the bladder today. Right renal subcapsular fluid collection is unchanged. Right renal enhancement and contrast excretion is stable. Stable all ventral lower abdominal incisional scar. Stable all inguinal lymph nodes. IMPRESSION: No finding identified to explain new fever. Stable CT abdomen and pelvis since the restaging study on 02/21/2015 (please see that report). Electronically Signed   By: Genevie Ann M.D.   On: 03/23/2015 14:49   Dg Chest Port 1 View  03/23/2015  CLINICAL DATA:  54 year old female with fever, nausea and vomiting. History of cervical cancer. EXAM: PORTABLE CHEST 1 VIEW COMPARISON:  10/11/2014 chest radiograph FINDINGS: The cardiomediastinal silhouette is unremarkable. There  is no evidence of focal airspace disease, pulmonary edema, suspicious pulmonary nodule/mass, pleural effusion, or pneumothorax. No acute bony abnormalities are identified. IMPRESSION: No active disease. Electronically Signed   By: Margarette Canada M.D.   On: 03/23/2015 11:43   I have personally reviewed and evaluated these images and lab results as part of my medical decision-making.   MDM   Final diagnoses:  Sepsis, due to unspecified organism [facility])    The patient does not appear to be in severe distress, she has a normal blood pressure, she is febrile, tachycardic, concern for sepsis from urinary source, check lab work, start antibiotics, fluid resuscitated, in and out catheterization for sample.  The patient is continued to be tachycardic, she is not febrile to 102.4. There is no hypotension, no hypoxia, x-ray shows no signs of infiltrate.  Urinalysis has nitrite positive, microscopic exam shows no significant bacteria or white blood cell count low but given the patient's history of stent this is a likely source. Pelvic exam with chaperone present shows scarred vaginal tissue, no purulent or foul-smelling discharge.  CRITICAL CARE Performed by: Johnna Acosta Total critical care time: 35 Critical care time was exclusive of separately billable procedures and treating other patients. Critical care was necessary to treat or prevent imminent or life-threatening deterioration. Critical care was time spent personally by me on the following activities: development of treatment plan with patient and/or surrogate as well as nursing, discussions with consultants, evaluation of patient's response to treatment, examination of patient, obtaining history from patient or surrogate, ordering and performing treatments and interventions, ordering and review of laboratory studies, ordering and review of radiographic studies, pulse oximetry and re-evaluation of patient's condition.  Meds given in  ED:  Medications  piperacillin-tazobactam (ZOSYN) IVPB 3.375 g (not administered)  sodium chloride 0.9 % bolus 1,000 mL (0 mLs Intravenous Stopped 03/23/15 1257)    Followed by  sodium chloride 0.9 % bolus 500 mL (500 mLs Intravenous New Bag/Given 03/23/15 1306)  piperacillin-tazobactam (ZOSYN) IVPB 3.375 g (0 g Intravenous Stopped 03/23/15 1147)  iohexol (OMNIPAQUE) 300 MG/ML solution 25  mL (25 mLs Oral Contrast Given 03/23/15 1222)  iohexol (OMNIPAQUE) 300 MG/ML solution 100 mL (100 mLs Intravenous Contrast Given 03/23/15 1408)  acetaminophen (TYLENOL) tablet 1,000 mg (1,000 mg Oral Given 03/23/15 1432)      Noemi Chapel, MD 03/23/15 1517

## 2015-03-23 NOTE — H&P (Addendum)
[facility] History and Physical  JAYCE BOYKO SLH:[id number] DOB: 1961/01/01 DOA: 03/23/2015  Referring physician: EDP PCP: [name], MD   Chief Complaint: FEVER AND DIFFICULTY URINATING FOR A WEEK.   HPI: IEASHA BOEREMA is a 54 y.o. female WITH H/O metastatic cervical cancer s/p chemo 3 weeks ago, ureteral stent replaced on sep 23rd, by Dr Junious Silk presents with fevers, fatigue, nausea, and difficulty urinating since one week and urinary incontinence since 3 weeks. She presented to ED and was found to be febrile, tachycardic, with elevated lactic acid and abnormal UA. She denies any other complaints. She  Was referred to medical service for admission for sepsis from urinary source.    Review of Systems:  Constitutional:  FEVERS AND FATIGUE.  HEENT:  No headaches, Difficulty swallowing,Tooth/dental problems,Sore throat,  No sneezing, itching, ear ache, nasal congestion, post nasal drip,  Cardio-vascular:  No chest pain, Orthopnea, PND, swelling in lower extremities, anasarca, dizziness, palpitations  GI:  No heartburn, indigestion, abdominal pain, nausea, vomiting, diarrhea, change in bowel habits, loss of appetite  Resp:  No shortness of breath with exertion or at rest. No excess mucus, no productive cough, No non-productive cough, No coughing up of blood.No change in color of mucus.No wheezing.No chest wall deformity  Skin:  no rash or lesions.  GU:  FREQUENT URINATION, BLADDER INCONTINENCE.  Musculoskeletal:  No joint pain or swelling. No decreased range of motion. No back pain.  Psych:  No change in mood or affect. No depression or anxiety. No memory loss.   Past Medical History  Diagnosis Date  . Hydronephrosis, right   . Ureteral stricture, right   . History of septic shock     05/ 2016  due to Peptostriptococcus/  right hydronephrosis and obstruction----  resolved  . 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 Mild intermittent asthma    . History of Clostridium difficile     10-15-2014  resolved  . Left rotator cuff tear arthropathy   . White coat syndrome without hypertension   . GERD (gastroesophageal reflux disease)   . Frequency of urination   . Anemia associated with chemotherapy     Antineoplastic chemo  . Recurrent cervical cancer [facility]) oncologist-  dr Tiena Manansala/ dr Marko Plume    dx 12/ 2015---  Radiation and Chemo (07-06-2014 to 09-03-2014)  now recurrent ,  Stage IIB  w/ Mets to pelvic/ retroperitoneum---  CHEMOTHERAPY CURRENTLY  . 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 Cervical cancer [facility])    Past Surgical History  Procedure Laterality Date  . Cystoscopy w/ ureteral stent placement Right 10/13/2014    Procedure: CYSTOSCOPY WITH RETROGRADE PYELOGRAM/URETERAL STENT PLACEMENT;  Surgeon: Festus Aloe, MD;  Location: [facility];  Service: Urology;  Laterality: Right;  . Transthoracic echocardiogram  10-13-2014    mild LVH,  ef 55-60%/  mild TR  . Tonsillectomy and adenoidectomy  age 76    age 75--  redo-- Adenoidectomy  . Laparoscopic cholecystectomy  1990's  . Cystoscopy with ureteroscopy and stent placement Right 02/18/2015    Procedure: CYSTO WITH RIGHT URETERAL STENT EXCHANGE, RIGHT URETEROSCOPY ;  Surgeon: Festus Aloe, MD;  Location: [facility];  Service: Urology;  Laterality: Right;  . Cystoscopy with biopsy N/A 02/18/2015    Procedure: CYSTOSCOPY WITH  BLADDER  BIOPSY;  Surgeon: Festus Aloe, MD;  Location: [facility];  Service: Urology;  Laterality: N/A;  . Cystoscopy w/ retrogrades Right 02/18/2015    Procedure: CYSTOSCOPY  WITH RETROGRADE PYELOGRAM;  Surgeon: Festus Aloe, MD;  Location: [facility];  Service: Urology;  Laterality: Right;   Social History:  reports that she has never smoked. She has never used smokeless tobacco. She reports that she does not drink alcohol or use illicit drugs.  Allergies  Allergen Reactions  . Morphine And Related  Rash    Per pt, morphine also makes her vomit  . Codeine Nausea Only  . Hydrocodone Nausea And Vomiting  . Peach [Prunus Persica] Swelling and Rash    No family history on file.   Prior to Admission medications   Medication Sig Start Date End Date Taking? Authorizing Provider  acetaminophen (TYLENOL) 500 MG tablet Take 1,000 mg by mouth 2 (two) times daily as needed for moderate pain, fever or headache.   Yes Historical Provider, MD  albuterol (PROVENTIL HFA;VENTOLIN HFA) 108 (90 BASE) MCG/ACT inhaler Inhale 2 puffs into the lungs every 6 (six) hours as needed for wheezing or shortness of breath.   Yes Historical Provider, MD  ciprofloxacin (CIPRO) 500 MG tablet take 1 tablet by mouth twice a day for 5 days 03/20/15 03/24/15 Yes Historical Provider, MD  dexamethasone (DECADRON) 4 MG tablet Take 5 tablets with food 12 hrs and 6 hrs prior to Taxol Chemotherapy 03/21/15  Yes Lennis Marion Downer, MD  diphenoxylate-atropine (LOMOTIL) 2.5-0.025 MG per tablet Take 1 tablet by mouth as needed for diarrhea or loose stools. Can take up to 8 times daily 08/10/14  Yes Historical Provider, MD  docusate sodium (COLACE) 100 MG capsule Take 100 mg by mouth daily as needed for mild constipation.  11/23/14  Yes Historical Provider, MD  HEMOCYTE 324 (106 FE) MG TABS Take 325 mg by mouth daily. Take 1 tablet daily on empty stomach with Vitamin C tablet 11/18/14  Yes Lennis Marion Downer, MD  ibuprofen (ADVIL,MOTRIN) 200 MG tablet Take 400 mg by mouth every 8 (eight) hours as needed for headache or moderate pain.    Yes Historical Provider, MD  lactose free nutrition (BOOST PLUS) LIQD Take 237 mLs by mouth 3 (three) times daily with meals. Patient taking differently: Take 237 mLs by mouth daily.  10/18/14  Yes Thurnell Lose, MD  loratadine (CLARITIN) 10 MG tablet Take 10 mg by mouth daily as needed for allergies.    Yes Historical Provider, MD  LORazepam (ATIVAN) 1 MG tablet Place 1/2 - 1 tablet under the tongue or swallow  evry 4-6 hrs as needed for nausea 03/21/15  Yes Lennis Marion Downer, MD  Magnesium Oxide 500 MG CAPS Take 500 mg by mouth daily.   Yes Historical Provider, MD  pantoprazole (PROTONIX) 40 MG tablet Take 1 tablet (40 mg total) by mouth daily. 03/15/15  Yes Lennis Marion Downer, MD  phenazopyridine (PYRIDIUM) 200 MG tablet take 1 tablet by mouth three times a day FOR BURNING 03/20/15  Yes Historical Provider, MD  potassium chloride (K-DUR,KLOR-CON) 10 MEQ tablet Take 10 mEq by mouth daily.   Yes Historical Provider, MD  vitamin C (ASCORBIC ACID) 500 MG tablet Take 500 mg by mouth daily.   Yes Historical Provider, MD  ondansetron (ZOFRAN-ODT) 8 MG disintegrating tablet Take 1 tablet (8 mg total) by mouth every 8 (eight) hours as needed for nausea or vomiting. 03/21/15   Gordy Levan, MD   Physical Exam: Filed Vitals:   03/23/15 1514 03/23/15 1530 03/23/15 1554 03/23/15 1607  BP: 139/74 127/69  140/70  Pulse: 113 108  103  Temp: 102.6 F (  39.2 C)  99.6 F (37.6 C) 100.4 F (38 C)  TempSrc: Oral  Oral Oral  Resp: 22     Height:    5\' 4"  (1.626 m)  Weight:    71.8 kg (158 lb 4.6 oz)  SpO2: 95% 95%  95%    Wt Readings from Last 3 Encounters:  03/23/15 71.8 kg (158 lb 4.6 oz)  03/21/15 69.491 kg (153 lb 3.2 oz)  02/24/15 70.58 kg (155 lb 9.6 oz)    General:  Appears calm and comfortable Eyes: PERRL, normal lids, irises & conjunctiva Neck: no LAD, masses or thyromegaly Cardiovascular: RRR, no m/r/g. No LE edema. Telemetry: SR, no arrhythmias  Respiratory: CTA bilaterally, no w/r/r. Normal respiratory effort. Abdomen: soft, ntnd Skin: no rash or induration seen on limited exam Musculoskeletal: grossly normal tone BUE/BLE Psychiatric: grossly normal mood and affect, speech fluent and appropriate Neurologic: grossly non-focal.          Labs on Admission:  Basic Metabolic Panel:  Recent Labs Lab 03/21/15 1534 03/23/15 1104  NA 136 133*  K 4.3 3.5  CL  --  97*  CO2 29 26  GLUCOSE  118 115*  BUN 16.0 14  CREATININE 1.1 1.11*  CALCIUM 9.6 9.2   Liver Function Tests:  Recent Labs Lab 03/21/15 1534 03/23/15 1104  AST 10 19  ALT <9 12*  ALKPHOS 70 69  BILITOT <0.30 0.9  PROT 6.8 7.3  ALBUMIN 2.8* 3.3*   No results for input(s): LIPASE, AMYLASE in the last 168 hours. No results for input(s): AMMONIA in the last 168 hours. CBC:  Recent Labs Lab 03/21/15 1534 03/22/15 1342 03/23/15 1104  WBC 11.8* 12.1* 16.1*  NEUTROABS 9.6* 9.8* 13.3*  HGB 10.9* 10.5* 10.7*  HCT 33.0* 31.8* 33.4*  MCV 101.5* 100.4 104.0*  PLT 368 390 398   Cardiac Enzymes: No results for input(s): CKTOTAL, CKMB, CKMBINDEX, TROPONINI in the last 168 hours.  BNP (last 3 results) No results for input(s): BNP in the last 8760 hours.  ProBNP (last 3 results) No results for input(s): PROBNP in the last 8760 hours.  CBG: No results for input(s): GLUCAP in the last 168 hours.  Radiological Exams on Admission: Ct Abdomen Pelvis W Contrast  03/23/2015  CLINICAL DATA:  54 year old female with pelvic pain and fever for 6 days. Cervical cancer chemotherapy in progress. Subsequent encounter. EXAM: CT ABDOMEN AND PELVIS WITH CONTRAST TECHNIQUE: Multidetector CT imaging of the abdomen and pelvis was performed using the standard protocol following bolus administration of intravenous contrast. CONTRAST:  171mL OMNIPAQUE IOHEXOL 300 MG/ML  SOLN COMPARISON:  CT Abdomen and Pelvis 02/21/2015 and earlier. FINDINGS: Negative lung bases.  No pericardial or pleural effusion. Stable visualized osseous structures. Stable presacral stranding or trace fluid. Rectum remains within normal limits. Stable post treatment appearance of the uterus, cervix and vagina compared to September. Stable all appearance of the at adnexa and right gonadal vein. Sigmoid colon remains within normal limits. Retained stool throughout the low left colon and transverse colon. Oral contrast has reached the right colon. There is continued  circumferential wall thickening in the distal ileum looped in the right lower quadrant located adjacent to the uterus and right adnexa (series 2, image 64). The terminal ileum itself appears relatively spared. No dilated small bowel. Decompressed stomach and duodenum. No abdominal free fluid or free air. Surgically absent gallbladder. Liver, spleen, pancreas, and adrenal glands remain within normal limits. Portal venous system is patent. Major arterial structures in the abdomen and  pelvis remain patent. Previously identified right lower quadrant mesenteric lymph nodes are stable to mildly regressed (e.g. Series 2, image 44 today versus same series and image previously). No new or increased lymphadenopathy. Both kidneys are stable since September. Mild prominence of the left renal collecting system but preserved delayed contrast excretion. Right double-J ureteral stent remains in place. No gas within the bladder today. Right renal subcapsular fluid collection is unchanged. Right renal enhancement and contrast excretion is stable. Stable all ventral lower abdominal incisional scar. Stable all inguinal lymph nodes. IMPRESSION: No finding identified to explain new fever. Stable CT abdomen and pelvis since the restaging study on 02/21/2015 (please see that report). Electronically Signed   By: Genevie Ann M.D.   On: 03/23/2015 14:49   Dg Chest Port 1 View  03/23/2015  CLINICAL DATA:  54 year old female with fever, nausea and vomiting. History of cervical cancer. EXAM: PORTABLE CHEST 1 VIEW COMPARISON:  10/11/2014 chest radiograph FINDINGS: The cardiomediastinal silhouette is unremarkable. There is no evidence of focal airspace disease, pulmonary edema, suspicious pulmonary nodule/mass, pleural effusion, or pneumothorax. No acute bony abnormalities are identified. IMPRESSION: No active disease. Electronically Signed   By: Margarette Canada M.D.   On: 03/23/2015 11:43    EKG: sinus tachycardia.   Assessment/Plan Active  Problems:   Sepsis ([place])  Sepsis from urinary source; Admit to telemetry.  Received 2.5 lit fluid boluses and her lactic acid normalized.  Started her on IV zosyn and got urine and blood cultures.  Resume zosyn.  Will call Dr Junious Silk and notify of the patient's admission.    Right ureteral stent exchanged on 9/23rd, and pt reports bladder in continence since then. Further management as per urology.    Metastatic adenocarcinoma of cervix: Further management as per Dr Marko Plume.    Dehydration: Fluid resuscitation.   Mild hyponatremia: probably from dehydration.    Leukocytosis: From the sepsis.  Monitor.    Anemia: macrocytic.  Probably from the malignancy.       Code Status: full code.  DVT Prophylaxis: Family Communication:family at bedside.  Disposition Plan: admit to telemetry.   Time spent: 50 min  [facility] Pager [phone]   Patient has likely pyometrium as source of sepsis. Plan for US guided D&C to evacuate pyometrium.  Donaciano Eva, MD

## 2015-03-23 NOTE — Telephone Encounter (Signed)
Received call from patient's sister in law stating that patient went to the ED at [facility] this morning and is being admitted. She asked that [name] be made aware of patient's admission.  Note routed to [name].

## 2015-03-23 NOTE — ED Notes (Signed)
Dr Sabra Heck made aware of abnormal lactic acid.

## 2015-03-24 ENCOUNTER — Ambulatory Visit: Payer: BC Managed Care – PPO

## 2015-03-24 DIAGNOSIS — C539 Malignant neoplasm of cervix uteri, unspecified: Secondary | ICD-10-CM | POA: Diagnosis present

## 2015-03-24 DIAGNOSIS — D6481 Anemia due to antineoplastic chemotherapy: Secondary | ICD-10-CM | POA: Diagnosis present

## 2015-03-24 DIAGNOSIS — Z885 Allergy status to narcotic agent status: Secondary | ICD-10-CM | POA: Diagnosis not present

## 2015-03-24 DIAGNOSIS — R3915 Urgency of urination: Secondary | ICD-10-CM | POA: Diagnosis present

## 2015-03-24 DIAGNOSIS — K2990 Gastroduodenitis, unspecified, without bleeding: Secondary | ICD-10-CM

## 2015-03-24 DIAGNOSIS — R509 Fever, unspecified: Secondary | ICD-10-CM

## 2015-03-24 DIAGNOSIS — N390 Urinary tract infection, site not specified: Secondary | ICD-10-CM | POA: Diagnosis present

## 2015-03-24 DIAGNOSIS — C779 Secondary and unspecified malignant neoplasm of lymph node, unspecified: Secondary | ICD-10-CM | POA: Diagnosis present

## 2015-03-24 DIAGNOSIS — K210 Gastro-esophageal reflux disease with esophagitis: Secondary | ICD-10-CM | POA: Diagnosis present

## 2015-03-24 DIAGNOSIS — K2970 Gastritis, unspecified, without bleeding: Secondary | ICD-10-CM | POA: Diagnosis not present

## 2015-03-24 DIAGNOSIS — Z9049 Acquired absence of other specified parts of digestive tract: Secondary | ICD-10-CM | POA: Diagnosis not present

## 2015-03-24 DIAGNOSIS — E871 Hypo-osmolality and hyponatremia: Secondary | ICD-10-CM | POA: Diagnosis present

## 2015-03-24 DIAGNOSIS — J4520 Mild intermittent asthma, uncomplicated: Secondary | ICD-10-CM | POA: Diagnosis present

## 2015-03-24 DIAGNOSIS — E860 Dehydration: Secondary | ICD-10-CM | POA: Diagnosis present

## 2015-03-24 DIAGNOSIS — C530 Malignant neoplasm of endocervix: Secondary | ICD-10-CM

## 2015-03-24 DIAGNOSIS — Z923 Personal history of irradiation: Secondary | ICD-10-CM | POA: Diagnosis not present

## 2015-03-24 DIAGNOSIS — A419 Sepsis, unspecified organism: Secondary | ICD-10-CM | POA: Diagnosis present

## 2015-03-24 DIAGNOSIS — C538 Malignant neoplasm of overlapping sites of cervix uteri: Secondary | ICD-10-CM | POA: Diagnosis not present

## 2015-03-24 DIAGNOSIS — T451X5A Adverse effect of antineoplastic and immunosuppressive drugs, initial encounter: Secondary | ICD-10-CM | POA: Diagnosis present

## 2015-03-24 DIAGNOSIS — G620 Drug-induced polyneuropathy: Secondary | ICD-10-CM | POA: Diagnosis present

## 2015-03-24 DIAGNOSIS — Z7952 Long term (current) use of systemic steroids: Secondary | ICD-10-CM | POA: Diagnosis not present

## 2015-03-24 DIAGNOSIS — C7989 Secondary malignant neoplasm of other specified sites: Secondary | ICD-10-CM

## 2015-03-24 DIAGNOSIS — D539 Nutritional anemia, unspecified: Secondary | ICD-10-CM | POA: Diagnosis present

## 2015-03-24 DIAGNOSIS — C7951 Secondary malignant neoplasm of bone: Secondary | ICD-10-CM | POA: Diagnosis present

## 2015-03-24 DIAGNOSIS — Z9221 Personal history of antineoplastic chemotherapy: Secondary | ICD-10-CM | POA: Diagnosis not present

## 2015-03-24 DIAGNOSIS — Z960 Presence of urogenital implants: Secondary | ICD-10-CM | POA: Diagnosis present

## 2015-03-24 DIAGNOSIS — Z79899 Other long term (current) drug therapy: Secondary | ICD-10-CM | POA: Diagnosis not present

## 2015-03-24 DIAGNOSIS — I959 Hypotension, unspecified: Secondary | ICD-10-CM | POA: Diagnosis present

## 2015-03-24 DIAGNOSIS — C786 Secondary malignant neoplasm of retroperitoneum and peritoneum: Secondary | ICD-10-CM | POA: Diagnosis present

## 2015-03-24 DIAGNOSIS — N1330 Unspecified hydronephrosis: Secondary | ICD-10-CM | POA: Diagnosis present

## 2015-03-24 DIAGNOSIS — N719 Inflammatory disease of uterus, unspecified: Secondary | ICD-10-CM | POA: Diagnosis present

## 2015-03-24 DIAGNOSIS — Z791 Long term (current) use of non-steroidal anti-inflammatories (NSAID): Secondary | ICD-10-CM | POA: Diagnosis not present

## 2015-03-24 DIAGNOSIS — D261 Other benign neoplasm of corpus uteri: Secondary | ICD-10-CM | POA: Diagnosis present

## 2015-03-24 DIAGNOSIS — R32 Unspecified urinary incontinence: Secondary | ICD-10-CM | POA: Diagnosis present

## 2015-03-24 DIAGNOSIS — M129 Arthropathy, unspecified: Secondary | ICD-10-CM | POA: Diagnosis present

## 2015-03-24 LAB — BASIC METABOLIC PANEL
Anion gap: 7 (ref 5–15)
BUN: 11 mg/dL (ref 6–20)
CHLORIDE: 102 mmol/L (ref 101–111)
CO2: 25 mmol/L (ref 22–32)
Calcium: 7.9 mg/dL — ABNORMAL LOW (ref 8.9–10.3)
Creatinine, Ser: 1.1 mg/dL — ABNORMAL HIGH (ref 0.44–1.00)
GFR calc Af Amer: >60 mL/min (ref >60)
GFR calc non Af Amer: 56 mL/min — ABNORMAL LOW (ref >60)
GLUCOSE: 102 mg/dL — AB (ref 65–99)
POTASSIUM: 3.6 mmol/L (ref 3.5–5.1)
Sodium: 134 mmol/L — ABNORMAL LOW (ref 135–145)

## 2015-03-24 LAB — CBC
HEMATOCRIT: 28.2 % — AB (ref 36.0–46.0)
HEMOGLOBIN: 9 g/dL — AB (ref 12.0–15.0)
MCH: 33.1 pg (ref 26.0–34.0)
MCHC: 31.9 g/dL (ref 30.0–36.0)
MCV: 103.7 fL — AB (ref 78.0–100.0)
Platelets: 301 10*3/uL (ref 150–400)
RBC: 2.72 MIL/uL — AB (ref 3.87–5.11)
RDW: 15.4 % (ref 11.5–15.5)
WBC: 13.8 10*3/uL — ABNORMAL HIGH (ref 4.0–10.5)

## 2015-03-24 LAB — URINE CULTURE: Culture: NO GROWTH

## 2015-03-24 LAB — GC/CHLAMYDIA PROBE AMP (~~LOC~~) NOT AT ARMC
CHLAMYDIA, DNA PROBE: NEGATIVE
NEISSERIA GONORRHEA: NEGATIVE

## 2015-03-24 MED ORDER — POLYETHYLENE GLYCOL 3350 17 G PO PACK: 17.0000 g | PACK | Freq: Every day | ORAL | Status: DC | PRN

## 2015-03-24 MED ORDER — SENNOSIDES-DOCUSATE SODIUM 8.6-50 MG PO TABS
2.0000 | ORAL_TABLET | Freq: Two times a day (BID) | ORAL | Status: DC
  Administered 2015-03-24 – 2015-03-25 (×2): 2 via ORAL
  Filled 2015-03-24 (×6): qty 2

## 2015-03-24 MED ORDER — PANTOPRAZOLE SODIUM 40 MG IV SOLR: 40.0000 mg | INTRAVENOUS | Status: DC

## 2015-03-24 MED ORDER — SODIUM CHLORIDE 0.9 % IV BOLUS (SEPSIS)
500.0000 mL | Freq: Once | INTRAVENOUS | Status: AC
  Administered 2015-03-24: 500 mL via INTRAVENOUS

## 2015-03-24 MED ORDER — TRAMADOL HCL 50 MG PO TABS
100.0000 mg | ORAL_TABLET | Freq: Four times a day (QID) | ORAL | Status: DC | PRN
  Administered 2015-03-24 – 2015-03-25 (×2): 100 mg via ORAL
  Filled 2015-03-24 (×2): qty 2

## 2015-03-24 NOTE — Progress Notes (Signed)
Medical Oncology March 24, 2015, 8:36 AM  Hospital day 2 Antibiotics: zosyn Chemotherapy: cisplatinum, taxol, avastin last 03-01-15; held 10-26 and 10-27 with infection symptoms Physicians:Emma Carita Pian, MD, Michel Bickers, M. Eskridge, Genia Del), Garrett notification of admission 03-23-15 with higher fever/ reported shaking chills despite empiric oral cipro since 03-20-15.  Patient followed at [facility] for metastatic cervical cancer involving pelvis/ retroperitoneum/cecal area/ mesenteric nodes, for which she is on treatment with chemotherapy and avastin, last 03-01-15. She had cystoscopy, bladder biopsy with fulguration, right diagnostic ureteroscopy,right retrograde pyelogram, and right ureteral stent exchange by Dr Junious Silk on 02-18-15. 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 She was seen at [facility] 03-21-15, with discomfort thought bladder or low pelvis, fevers to 100, poor po intake with some nausea. Urine culture was sent 10-24 (on cipro), no growth so far. She was given IVF and had blood culture done at [facility] on 10-25 (no growth so far, no shaking chills at time of that blood culture). Temperatures were as high as 102 by 03-23-15 with shaking chills reported and she was admitted.  NOTE past urosepsis and past C diff.   Subjective: Discomfort now is bilateral lower abdominal and suprapubic mild cramping. She is voiding "every 15 min" with IVF, no hematuria, slight burning. She has nausea when temperature is high, has eaten only bites in several days. Bowels moved with normal stool yesterday, this uncomfortable and requires effort. She denies SOB or other respiratory symptoms.    No central line Right ureteral stent Has not had flu  vaccine yet   ONCOLOGIC HISTORY Patient had been in usual good health, with no previous abnormal PAP smears, when she developed frequent urination and vaginal bleeding in late 2015. She had endometrial biopsy by Dr Janyth Pupa [phone]) with adenocarcinoma. PET in [place] system 2-94-76 had hypermetabolic uptake within the uterine cervix and lower uterine segment and metastatic adenopathy within the bilateral pelvis and periaortic retroperitoneum. Dr Genia Del and Dr Marguerita Merles treated with pelvic and para-aortic radiation given with 6 cycles of sensitizing CDDP, and HDR x5 from 07-06-14 thru 09-03-14. Patient had a difficult time with nausea, vomiting, diarrhea and hypokalemia/ hypomagnesemia thru treatment. Patient used ODT zofran and ativan during chemotherapy. She had a difficult time doing oral prehydration for CDDP. She had no peripheral neuropathy with the CDDP. There was concern by completion of the treatment that she did not have optimal response, with plan for repeat evaluation at 6 weeks from completion of the treatment. Prior to planned reevaluation, patient began having daily fevers for possibly 4 weeks, as high as 102 degrees x 2 weeks prior to 10-11-14; by presentation at ED she also had increased right flank pain. She had CT at [facility] on 10-08-14, this compared with PET of 05-2014 showed increase in subcapsular fluid collection right kidney and right hydronephrosis, increase in size of endometrial canal/ uterus/ right adnexa, and improvement in retroperitoneal, common iliac and bilateral pelvic sidewall adenopathy. She was admitted to [facility] ICU 5-16/ 10-12-14 with sepsis from peptostreptococcus. She had percutaneous drainage by IR of 635 cc dark thin fluid from right perinephric area on 10-12-14, with perinephric drain removed by urology on 10-18-14. She had right ureteral stent placement by Dr Junious Silk on 10-13-14. She developed C.difficile diarrhea,  begun on flagyl 10-15-14  x 2 weeks, also on ceftin during that time. She saw Dr [name] in consultation on 11-05-14, with cervix grossly normal appearance, firm to palpation with right anterior extension, and purulent material from cervical os with manipulation, no palpable adnexal masses, induration R>L parametrium. PET 07-29-27 had hypermetabolic tissue thickening at cecum, + small right sided mesenteric and inguinal nodes, foci in myometriium and in region of cervix, small right perinephric fluid collection and dilated fluid filled endometrium. Dr [name] saw her following the PET, with recommendation for systemic treatment of the metastatic disease with CDDP, taxol and avastin. First CDDP Taxol 11-19-14, neutropenic day 14. Repeat CT showed essentially stable, minimal right perinephric fluid on 12-06-14, prior to cycle 2 CDDP taxol 7-14 with first avastin 12-10-14 and neulasta. CT AP 02-21-15 after 4 cycles CDDP taxol/ 3 cycles avastin showed improvement, plan to continue x another 3 cycles and reevaluate.   Objective: Vital signs in last 24 hours: Blood pressure 138/71, pulse 103, temperature 98.8 F (37.1 C), temperature source Oral, resp. rate 18, height 5\' 4"  (1.626 m), weight 158 lb 4.6 oz (71.8 kg), last menstrual period 11/05/2011, SpO2 97 %.  Awake, alert, looks tired but not in acute discomfort supine in bed on RA. Peripheral IVs in bilateral UE. Alopecia. PERRL, not icteric. Oral mucosa moist. Lungs clear anteriorly and laterally. Heart RRR no gallop. Abdomen normally active BS in upper quadrants, slightly distended there. Slight tenderness epigastrium. Minimal tenderness bilateral low abdomen and at midline low abdomen. LE no edema, cords, tenderness and feet warm. Moves all extremities easily. Skin without rash or ecchymosis. Psych appropriate mood and affect  Intake/Output from previous day: 10/26 0701 - 10/27 0700 In: 580 [P.O.:480; IV Piggyback:100] Out: 10 [Urine:10] Intake/Output this  shift:     Lab Results:  Recent Labs  03/23/15 1104 03/24/15 0457  WBC 16.1* 13.8*  HGB 10.7* 9.0*  HCT 33.4* 28.2*  PLT 398 301   Hemoglobin down with rehydration  BMET  Recent Labs  03/23/15 1104 03/24/15 0457  NA 133* 134*  K 3.5 3.6  CL 97* 102  CO2 26 25  GLUCOSE 115* 102*  BUN 14 11  CREATININE 1.11* 1.10*  CALCIUM 9.2 7.9*   Blood cultures from 10-25 and 10-26 in process Urine culture from 10-24 no growth on cipro Urine culture from admission pending No trich/yeast/clue cells 10-26  Studies/Results: Ct Abdomen Pelvis W Contrast  03/23/2015  CLINICAL DATA:  54 year old female with pelvic pain and fever for 6 days. Cervical cancer chemotherapy in progress. Subsequent encounter. EXAM: CT ABDOMEN AND PELVIS WITH CONTRAST TECHNIQUE: Multidetector CT imaging of the abdomen and pelvis was performed using the standard protocol following bolus administration of intravenous contrast. CONTRAST:  131mL OMNIPAQUE IOHEXOL 300 MG/ML  SOLN COMPARISON:  CT Abdomen and Pelvis 02/21/2015 and earlier. FINDINGS: Negative lung bases.  No pericardial or pleural effusion. Stable visualized osseous structures. Stable presacral stranding or trace fluid. Rectum remains within normal limits. Stable post treatment appearance of the uterus, cervix and vagina compared to September. Stable all appearance of the at adnexa and right gonadal vein. Sigmoid colon remains within normal limits. Retained stool throughout the low left colon and transverse colon. Oral contrast has reached the right colon. There is continued circumferential wall thickening in the distal ileum looped in the right lower quadrant located adjacent to the uterus and right adnexa (series 2, image 64). The terminal ileum itself appears relatively spared. No dilated small bowel. Decompressed stomach and duodenum. No abdominal free fluid or  free air. Surgically absent gallbladder. Liver, spleen, pancreas, and adrenal glands remain within  normal limits. Portal venous system is patent. Major arterial structures in the abdomen and pelvis remain patent. Previously identified right lower quadrant mesenteric lymph nodes are stable to mildly regressed (e.g. Series 2, image 44 today versus same series and image previously). No new or increased lymphadenopathy. Both kidneys are stable since September. Mild prominence of the left renal collecting system but preserved delayed contrast excretion. Right double-J ureteral stent remains in place. No gas within the bladder today. Right renal subcapsular fluid collection is unchanged. Right renal enhancement and contrast excretion is stable. Stable all ventral lower abdominal incisional scar. Stable all inguinal lymph nodes. IMPRESSION: No finding identified to explain new fever. Stable CT abdomen and pelvis since the restaging study on 02/21/2015 (please see that report). Electronically Signed   By: Genevie Ann M.D.   On: 03/23/2015 14:49   Dg Chest Port 1 View  03/23/2015  CLINICAL DATA:  54 year old female with fever, nausea and vomiting. History of cervical cancer. EXAM: PORTABLE CHEST 1 VIEW COMPARISON:  10/11/2014 chest radiograph FINDINGS: The cardiomediastinal silhouette is unremarkable. There is no evidence of focal airspace disease, pulmonary edema, suspicious pulmonary nodule/mass, pleural effusion, or pneumothorax. No acute bony abnormalities are identified. IMPRESSION: No active disease. Electronically Signed   By: Margarette Canada M.D.   On: 03/23/2015 11:43      Discussion:  Appreciate care by hospitalist team. Agree with IV anitbiotics awaiting cultures. No increase in right renal subcapsular fluid collection. Wonder if the thickened loop of ileum in pelvis is causing some symptoms? Suggest asking Dr Junious Silk to follow up, tho cannot tell problem with present stent on this scan. She did have bladder biopsies, done while on avastin, so may not heal those areas as easily as expected.  I will let gyn  oncology know, as may be helpful for that group or general surgery to review if symptoms do not quickly improve on IV antibiotics. Holding chemo and avastin (out 23 days from avastin now)   Assessment/Plan: 1. Fever, chills, poor po intake x 1 week, not improved with empiric cipro and other outpatient interventions. Presumed urinary source, tho no cultures confirm yet. Admitted with higher fever and shaking chills, now on zosyn. CT as above, thickened loop of ileum in pelvis, no recurrent subcapsular fluid on right kidney, no other clear findings for fever. May want urology to see, as bladder biopsy sites may not heal as well as usual due to recent avastin. I will let gyn oncology know situation. 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 Improvement with CDDP taxol avastin by CT 02-21-15, with 3 additional cycles planned beginning with treatment 03-01-15. Hold treatment with infection symptoms. WBC/ ANC not low; hemoglobin probably now correct value with rehydration.\ 3. History of urosepsis and C diff 4.No central catheter at patient's preference 5.previous hypoK and hypoMg related to CDDP, which has needed supplementation 6. Chemo anemia: transfused earlier in course. Not too compliant with oral iron 7.left shoulder possible rotator cuff tear, which she has not wanted to address with rest of situation 8.history of asthma, none x months 9.no flu vaccine yet, hold with present infection 10. 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

## 2015-03-24 NOTE — Progress Notes (Signed)
Initial Nutrition Assessment  DOCUMENTATION CODES:   Not applicable  INTERVENTION:   Pt does not like wild berry or peach boost, is getting her husband to bring boost from home. Magic cup TID with meals, each supplement provides 290 kcal and 9 grams of protein Bowls of cereal between meals as well.   NUTRITION DIAGNOSIS:   Inadequate oral intake related to cancer and cancer related treatments, poor appetite as evidenced by per patient/family report.  GOAL:   Patient will meet greater than or equal to 90% of their needs  MONITOR:   PO intake, Labs, I & O's  REASON FOR ASSESSMENT:   Malnutrition Screening Tool    ASSESSMENT:   Stacy Webster is a 54 y.o. female WITH H/O metastatic cervical cancer s/p chemo 3 weeks ago, ureteral stent replaced on sep 23rd, by Dr Junious Silk presents with fevers, fatigue, nausea, and difficulty urinating since one week and urinary incontinence since 3 weeks. She presented to ED and was found to be febrile, tachycardic, with elevated lactic acid and abnormal UA.  Spoke with patient and family at bedside. States her appetite is ok, usually comes back during 3rd week of chemo, but has been worse with sepsis/fever etc. She is also a picky eater, which likely does not help her situation.  She says she loss considerable weight during her previous hospitalization in April, but feels healthier now and is not interested in re-gaining weight.  She has issues with pain medication causing nausea. States only pain med that did not cause it was fentanyl.  Nutrition-Focused physical exam completed. Findings are no fat depletion, no muscle depletion, and no edema.   Cr 1.10, LA WNL.  Medications reviewed.   Diet Order:  Diet regular Room service appropriate?: Yes; Fluid consistency:: Thin  Skin:  Reviewed, no issues  Last BM:  03/23/2015  Height:   Ht Readings from Last 1 Encounters:  03/23/15 5\' 4"  (1.626 m)    Weight:   Wt Readings from Last  1 Encounters:  03/23/15 158 lb 4.6 oz (71.8 kg)    Ideal Body Weight:  54.54 kg  BMI:  Body mass index is 27.16 kg/(m^2).  Estimated Nutritional Needs:   Kcal:  1800 - 2100 calories  Protein:  85-100  Fluid:  >/= 1.8L  EDUCATION NEEDS:   No education needs identified at this time  Satira Anis. Addaline Peplinski, MS, RD LDN After Hours/Weekend Pager [phone]

## 2015-03-24 NOTE — Progress Notes (Signed)
[facility] PROGRESS NOTE  Stacy Webster LTJ:[id number] DOB: [birth year] DOA: 03/23/2015 PCP: [name], MD  Assessment/Plan: Sepsis from urinary source; Admit to telemetry.  Received 2.5 lit fluid boluses and her lactic acid normalized.  Started her on IV zosyn and got urine and blood cultures.  Resume zosyn.  Improving.     Right ureteral stent exchanged on 9/23rd, and pt reports bladder in continence since then. Further management as per urology.    Metastatic adenocarcinoma of cervix: Further management as per Dr Marko Plume.    Dehydration: Fluid resuscitation.   Mild hyponatremia: probably from dehydration.  Improved.    Leukocytosis: From the sepsis. improving.  Monitor.    Anemia: macrocytic.  Probably from the malignancy.   Code Status: full code.  Family Communication: family at bedside.  Disposition Plan: remain for another day or two.    Consultants:  Oncology.   Procedures:  none  Antibiotics:  zosyn  HPI/Subjective: Abdominal cramps.   Objective: Filed Vitals:   03/24/15 1836  BP:   Pulse:   Temp: 99.6 F (37.6 C)  Resp:     Intake/Output Summary (Last 24 hours) at 03/24/15 1922 Last data filed at 03/24/15 1300  Gross per 24 hour  Intake   1010 ml  Output      0 ml  Net   1010 ml   Filed Weights   03/23/15 1032 03/23/15 1053 03/23/15 1607  Weight: 68.493 kg (151 lb) 68.493 kg (151 lb) 71.8 kg (158 lb 4.6 oz)    Exam:   General:  Alert afebrile comfortable.   Cardiovascular: s1s2  Respiratory: ctab  Abdomen: soft mildly tender ND bs+  Musculoskeletal: no pedal edema.   Data Reviewed: Basic Metabolic Panel:  Recent Labs Lab 03/21/15 1534 03/23/15 1104 03/24/15 0457  NA 136 133* 134*  K 4.3 3.5 3.6  CL  --  97* 102  CO2 29 26 25   GLUCOSE 118 115* 102*  BUN 16.0 14 11  CREATININE 1.1 1.11* 1.10*  CALCIUM 9.6 9.2 7.9*   Liver Function Tests:  Recent Labs Lab 03/21/15 1534  03/23/15 1104  AST 10 19  ALT <9 12*  ALKPHOS 70 69  BILITOT <0.30 0.9  PROT 6.8 7.3  ALBUMIN 2.8* 3.3*   No results for input(s): LIPASE, AMYLASE in the last 168 hours. No results for input(s): AMMONIA in the last 168 hours. CBC:  Recent Labs Lab 03/21/15 1534 03/22/15 1342 03/23/15 1104 03/24/15 0457  WBC 11.8* 12.1* 16.1* 13.8*  NEUTROABS 9.6* 9.8* 13.3*  --   HGB 10.9* 10.5* 10.7* 9.0*  HCT 33.0* 31.8* 33.4* 28.2*  MCV 101.5* 100.4 104.0* 103.7*  PLT 368 390 398 301   Cardiac Enzymes: No results for input(s): CKTOTAL, CKMB, CKMBINDEX, TROPONINI in the last 168 hours. BNP (last 3 results) No results for input(s): BNP in the last 8760 hours.  ProBNP (last 3 results) No results for input(s): PROBNP in the last 8760 hours.  CBG: No results for input(s): GLUCAP in the last 168 hours.  Recent Results (from the past 240 hour(s))  Urine culture     Status: None   Collection Time: 03/21/15  4:45 PM  Result Value Ref Range Status   Specimen Description URINE, RANDOM  Final   Special Requests PATIENT ON FOLLOWING CIPROFLOXACIN  Final   Culture   Final    NO GROWTH 2 DAYS Performed at [facility]    Report Status 03/23/2015 FINAL  Final  Blood Culture (routine x 2)  Status: None (Preliminary result)   Collection Time: 03/23/15 10:55 AM  Result Value Ref Range Status   Specimen Description BLOOD LEFT ANTECUBITAL  Final   Special Requests BOTTLES DRAWN AEROBIC AND ANAEROBIC 5CC  Final   Culture   Final    NO GROWTH 1 DAY Performed at [facility]    Report Status PENDING  Incomplete  Blood Culture (routine x 2)     Status: None (Preliminary result)   Collection Time: 03/23/15 11:00 AM  Result Value Ref Range Status   Specimen Description BLOOD RIGHT ARM  Final   Special Requests BOTTLES DRAWN AEROBIC AND ANAEROBIC 5ML  Final   Culture   Final    NO GROWTH 1 DAY Performed at [facility]    Report Status PENDING  Incomplete  Urine  culture     Status: None   Collection Time: 03/23/15 11:03 AM  Result Value Ref Range Status   Specimen Description URINE, CATHETERIZED  Final   Special Requests NONE  Final   Culture   Final    NO GROWTH 1 DAY Performed at [facility]    Report Status 03/24/2015 FINAL  Final  Wet prep, genital     Status: Abnormal   Collection Time: 03/23/15  3:14 PM  Result Value Ref Range Status   Yeast Wet Prep HPF POC NONE SEEN NONE SEEN Final   Trich, Wet Prep NONE SEEN NONE SEEN Final   Clue Cells Wet Prep HPF POC NONE SEEN NONE SEEN Final   WBC, Wet Prep HPF POC FEW (A) NONE SEEN Final     Studies: Ct Abdomen Pelvis W Contrast  03/23/2015  CLINICAL DATA:  54 year old female with pelvic pain and fever for 6 days. Cervical cancer chemotherapy in progress. Subsequent encounter. EXAM: CT ABDOMEN AND PELVIS WITH CONTRAST TECHNIQUE: Multidetector CT imaging of the abdomen and pelvis was performed using the standard protocol following bolus administration of intravenous contrast. CONTRAST:  143mL OMNIPAQUE IOHEXOL 300 MG/ML  SOLN COMPARISON:  CT Abdomen and Pelvis 02/21/2015 and earlier. FINDINGS: Negative lung bases.  No pericardial or pleural effusion. Stable visualized osseous structures. Stable presacral stranding or trace fluid. Rectum remains within normal limits. Stable post treatment appearance of the uterus, cervix and vagina compared to September. Stable all appearance of the at adnexa and right gonadal vein. Sigmoid colon remains within normal limits. Retained stool throughout the low left colon and transverse colon. Oral contrast has reached the right colon. There is continued circumferential wall thickening in the distal ileum looped in the right lower quadrant located adjacent to the uterus and right adnexa (series 2, image 64). The terminal ileum itself appears relatively spared. No dilated small bowel. Decompressed stomach and duodenum. No abdominal free fluid or free air. Surgically  absent gallbladder. Liver, spleen, pancreas, and adrenal glands remain within normal limits. Portal venous system is patent. Major arterial structures in the abdomen and pelvis remain patent. Previously identified right lower quadrant mesenteric lymph nodes are stable to mildly regressed (e.g. Series 2, image 44 today versus same series and image previously). No new or increased lymphadenopathy. Both kidneys are stable since September. Mild prominence of the left renal collecting system but preserved delayed contrast excretion. Right double-J ureteral stent remains in place. No gas within the bladder today. Right renal subcapsular fluid collection is unchanged. Right renal enhancement and contrast excretion is stable. Stable all ventral lower abdominal incisional scar. Stable all inguinal lymph nodes. IMPRESSION: No finding identified to explain new  fever. Stable CT abdomen and pelvis since the restaging study on 02/21/2015 (please see that report). Electronically Signed   By: Genevie Ann M.D.   On: 03/23/2015 14:49   Dg Chest Port 1 View  03/23/2015  CLINICAL DATA:  54 year old female with fever, nausea and vomiting. History of cervical cancer. EXAM: PORTABLE CHEST 1 VIEW COMPARISON:  10/11/2014 chest radiograph FINDINGS: The cardiomediastinal silhouette is unremarkable. There is no evidence of focal airspace disease, pulmonary edema, suspicious pulmonary nodule/mass, pleural effusion, or pneumothorax. No acute bony abnormalities are identified. IMPRESSION: No active disease. Electronically Signed   By: Margarette Canada M.D.   On: 03/23/2015 11:43    Scheduled Meds: . enoxaparin (LOVENOX) injection  40 mg Subcutaneous Q24H  . pantoprazole  40 mg Oral Daily  . piperacillin-tazobactam (ZOSYN)  IV  3.375 g Intravenous Q8H  . potassium chloride  10 mEq Oral Daily  . senna-docusate  2 tablet Oral BID  . sodium chloride  3 mL Intravenous Q12H   Continuous Infusions: . sodium chloride 100 mL/hr at 03/24/15 7867     Active Problems:   Sepsis ([place])    Time spent: 25 minutes.     [facility] Pager [phone] If 7PM-7AM, please contact night-coverage at [email or web address], password [email or web address] 03/24/2015, 7:22 PM  LOS: 0 days

## 2015-03-24 NOTE — Care Management Obs Status (Signed)
[place] NOTIFICATION   Patient Details  Name: Stacy Webster MRN: [id number] Date of Birth: [birth year]   Medicare Observation Status Notification Given:  Yes    MahabirJuliann Pulse, RN 03/24/2015, 11:28 AM

## 2015-03-25 ENCOUNTER — Inpatient Hospital Stay (HOSPITAL_COMMUNITY): Payer: BC Managed Care – PPO

## 2015-03-25 ENCOUNTER — Encounter (HOSPITAL_COMMUNITY): Payer: Self-pay | Admitting: Gynecologic Oncology

## 2015-03-25 ENCOUNTER — Inpatient Hospital Stay (HOSPITAL_COMMUNITY): Payer: BC Managed Care – PPO | Admitting: Anesthesiology

## 2015-03-25 ENCOUNTER — Encounter (HOSPITAL_COMMUNITY)
Admission: EM | Disposition: A | Discharge status: Home / Self Care | Payer: Self-pay | Source: Home / Self Care | Attending: Internal Medicine

## 2015-03-25 ENCOUNTER — Ambulatory Visit: Payer: BC Managed Care – PPO

## 2015-03-25 DIAGNOSIS — N719 Inflammatory disease of uterus, unspecified: Secondary | ICD-10-CM

## 2015-03-25 DIAGNOSIS — C538 Malignant neoplasm of overlapping sites of cervix uteri: Secondary | ICD-10-CM

## 2015-03-25 HISTORY — PX: DILATION AND CURETTAGE OF UTERUS: SHX78

## 2015-03-25 LAB — CBC WITH DIFFERENTIAL/PLATELET
Basophils Absolute: 0 10*3/uL (ref 0.0–0.1)
Basophils Relative: 0 %
Eosinophils Absolute: 0 10*3/uL (ref 0.0–0.7)
Eosinophils Relative: 0 %
HCT: 28.4 % — ABNORMAL LOW (ref 36.0–46.0)
Hemoglobin: 9.4 g/dL — ABNORMAL LOW (ref 12.0–15.0)
LYMPHS ABS: 1.2 10*3/uL (ref 0.7–4.0)
LYMPHS PCT: 6 %
MCH: 34.3 pg — AB (ref 26.0–34.0)
MCHC: 33.1 g/dL (ref 30.0–36.0)
MCV: 103.6 fL — AB (ref 78.0–100.0)
MONO ABS: 1.5 10*3/uL — AB (ref 0.1–1.0)
Monocytes Relative: 8 %
Neutro Abs: 15.4 10*3/uL — ABNORMAL HIGH (ref 1.7–7.7)
Neutrophils Relative %: 86 %
PLATELETS: 272 10*3/uL (ref 150–400)
RBC: 2.74 MIL/uL — ABNORMAL LOW (ref 3.87–5.11)
RDW: 15.4 % (ref 11.5–15.5)
WBC: 18.1 10*3/uL — ABNORMAL HIGH (ref 4.0–10.5)

## 2015-03-25 LAB — COMPREHENSIVE METABOLIC PANEL
ALT: 10 U/L — ABNORMAL LOW (ref 14–54)
AST: 13 U/L — ABNORMAL LOW (ref 15–41)
Albumin: 2.5 g/dL — ABNORMAL LOW (ref 3.5–5.0)
Alkaline Phosphatase: 67 U/L (ref 38–126)
Anion gap: 8 (ref 5–15)
BUN: 9 mg/dL (ref 6–20)
CHLORIDE: 102 mmol/L (ref 101–111)
CO2: 25 mmol/L (ref 22–32)
CREATININE: 1.1 mg/dL — AB (ref 0.44–1.00)
Calcium: 8.3 mg/dL — ABNORMAL LOW (ref 8.9–10.3)
GFR, EST NON AFRICAN AMERICAN: 56 mL/min — AB (ref >60)
Glucose, Bld: 96 mg/dL (ref 65–99)
POTASSIUM: 3.2 mmol/L — AB (ref 3.5–5.1)
Sodium: 135 mmol/L (ref 135–145)
Total Bilirubin: 0.4 mg/dL (ref 0.3–1.2)
Total Protein: 6.1 g/dL — ABNORMAL LOW (ref 6.5–8.1)

## 2015-03-25 SURGERY — DILATION AND CURETTAGE
Anesthesia: General | Site: Uterus

## 2015-03-25 MED ORDER — LACTATED RINGERS IV SOLN: INTRAVENOUS | Status: DC

## 2015-03-25 MED ORDER — PROMETHAZINE HCL 25 MG/ML IJ SOLN: 6.2500 mg | INTRAMUSCULAR | Status: DC | PRN

## 2015-03-25 MED ORDER — LACTATED RINGERS IV SOLN
INTRAVENOUS | Status: DC | PRN
  Administered 2015-03-25: 15:00:00 via INTRAVENOUS

## 2015-03-25 MED ORDER — FENTANYL CITRATE (PF) 100 MCG/2ML IJ SOLN
INTRAMUSCULAR | Status: DC | PRN
  Administered 2015-03-25 (×4): 25 ug via INTRAVENOUS

## 2015-03-25 MED ORDER — PROPOFOL 10 MG/ML IV BOLUS
INTRAVENOUS | Status: AC
  Filled 2015-03-25: qty 20

## 2015-03-25 MED ORDER — FENTANYL CITRATE (PF) 100 MCG/2ML IJ SOLN
INTRAMUSCULAR | Status: AC
  Filled 2015-03-25: qty 4

## 2015-03-25 MED ORDER — FENTANYL CITRATE (PF) 100 MCG/2ML IJ SOLN: 25.0000 ug | INTRAMUSCULAR | Status: DC | PRN

## 2015-03-25 MED ORDER — LIDOCAINE HCL (CARDIAC) 20 MG/ML IV SOLN
INTRAVENOUS | Status: DC | PRN
  Administered 2015-03-25: 50 mg via INTRAVENOUS

## 2015-03-25 MED ORDER — POTASSIUM CHLORIDE CRYS ER 20 MEQ PO TBCR
40.0000 meq | EXTENDED_RELEASE_TABLET | Freq: Two times a day (BID) | ORAL | Status: AC
  Administered 2015-03-25 – 2015-03-26 (×2): 40 meq via ORAL
  Filled 2015-03-25 (×2): qty 2

## 2015-03-25 MED ORDER — DEXAMETHASONE SODIUM PHOSPHATE 4 MG/ML IJ SOLN
INTRAMUSCULAR | Status: DC | PRN
  Administered 2015-03-25: 10 mg via INTRAVENOUS

## 2015-03-25 MED ORDER — MEPERIDINE HCL 50 MG/ML IJ SOLN: 6.2500 mg | INTRAMUSCULAR | Status: DC | PRN

## 2015-03-25 MED ORDER — 0.9 % SODIUM CHLORIDE (POUR BTL) OPTIME
TOPICAL | Status: DC | PRN
  Administered 2015-03-25: 1000 mL

## 2015-03-25 MED ORDER — MIDAZOLAM HCL 2 MG/2ML IJ SOLN
INTRAMUSCULAR | Status: AC
  Filled 2015-03-25: qty 4

## 2015-03-25 MED ORDER — PROPOFOL 10 MG/ML IV BOLUS
INTRAVENOUS | Status: DC | PRN
  Administered 2015-03-25: 150 mg via INTRAVENOUS

## 2015-03-25 MED ORDER — MIDAZOLAM HCL 5 MG/5ML IJ SOLN
INTRAMUSCULAR | Status: DC | PRN
Start: 2015-03-25 — End: 2015-03-25
  Administered 2015-03-25: 2 mg via INTRAVENOUS

## 2015-03-25 SURGICAL SUPPLY — 17 items
CATH ROBINSON RED A/P 16FR (CATHETERS) ×2 IMPLANT
COVER SURGICAL LIGHT HANDLE (MISCELLANEOUS) ×2 IMPLANT
DRAPE SHEET LG 3/4 BI-LAMINATE (DRAPES) ×2 IMPLANT
DRSG TELFA 3X8 NADH (GAUZE/BANDAGES/DRESSINGS) ×2 IMPLANT
GAUZE SPONGE 4X4 16PLY XRAY LF (GAUZE/BANDAGES/DRESSINGS) ×2 IMPLANT
GLOVE BIO SURGEON STRL SZ 6 (GLOVE) ×2 IMPLANT
GOWN STRL REUS W/TWL LRG LVL3 (GOWN DISPOSABLE) ×4 IMPLANT
KIT BASIN OR (CUSTOM PROCEDURE TRAY) ×2 IMPLANT
LUBRICANT JELLY K Y 4OZ (MISCELLANEOUS) ×2 IMPLANT
PACK LITHOTOMY IV (CUSTOM PROCEDURE TRAY) ×2 IMPLANT
SUT VIC AB 0 CT1 27 (SUTURE) ×1
SUT VIC AB 0 CT1 27XBRD ANTBC (SUTURE) ×1 IMPLANT
SYR BULB IRRIGATION 50ML (SYRINGE) ×2 IMPLANT
TOWEL OR 17X26 10 PK STRL BLUE (TOWEL DISPOSABLE) ×2 IMPLANT
UNDERPAD 30X30 INCONTINENT (UNDERPADS AND DIAPERS) ×2 IMPLANT
WATER STERILE IRR 1500ML POUR (IV SOLUTION) ×2 IMPLANT
YANKAUER SUCT BULB TIP NO VENT (SUCTIONS) ×2 IMPLANT

## 2015-03-25 NOTE — Anesthesia Procedure Notes (Signed)
Procedure Name: LMA Insertion Date/Time: 03/25/2015 3:25 PM Performed by: Anne Fu Pre-anesthesia Checklist: Patient identified, Emergency Drugs available, Suction available, Patient being monitored and Timeout performed Patient Re-evaluated:Patient Re-evaluated prior to inductionOxygen Delivery Method: Circle system utilized Preoxygenation: Pre-oxygenation with 100% oxygen Intubation Type: IV induction Ventilation: Mask ventilation without difficulty LMA: LMA inserted LMA Size: 4.0 Number of attempts: 1 Placement Confirmation: positive ETCO2 and breath sounds checked- equal and bilateral Tube secured with: Tape

## 2015-03-25 NOTE — Transfer of Care (Signed)
Immediate Anesthesia Transfer of Care Note  Patient: Stacy Webster  Procedure(s) Performed: Procedure(s): DILATATION AND CURETTAGE WITH ULTRASOUND (N/A)  Patient Location: PACU  Anesthesia Type:General  Level of Consciousness:  sedated, patient cooperative and responds to stimulation  Airway & Oxygen Therapy:Patient Spontanous Breathing and Patient connected to face mask oxgen  Post-op Assessment:  Report given to PACU RN and Post -op Vital signs reviewed and stable  Post vital signs:  Reviewed and stable  Last Vitals:  Filed Vitals:   03/25/15 1301  BP: 131/62  Pulse: 100  Temp: 38.3 C  Resp: 19    Complications: No apparent anesthesia complications

## 2015-03-25 NOTE — Progress Notes (Signed)
Patient underwent uncomplicated D&C and suction evacuation of a small amount of pyometrium. She tolerated the procedure well.  She can return to the floor (telemetry status) on a full diet and prophylactic anticoagulation.  Cultures and biopsies are pending from today's procedure. Recommend continuing antibiotic coverage.  Donaciano Eva, MD Cell: [phone]

## 2015-03-25 NOTE — Progress Notes (Signed)
[facility] PROGRESS NOTE  Stacy Webster YDX:[id number] DOB: 01/03/1961 DOA: 03/23/2015 PCP: [name], MD  Assessment/Plan: Sepsis from urinary source; she was febrile, hypotensive and tachycardic on arrival, with abnormal UA and elevated lactic acid.  Admit to telemetry.  Received 2.5 lit fluid boluses and her lactic acid normalized.  Started her on IV zosyn and got urine and blood cultures.  Resume zosyn.  Improving.  Persistent spiking of fevers and gyn onc consulted.  Patient underwent uncomplicated D&C and suction evacuation of a small amount of pyometrium. Culture and biopsies are sent .     Right ureteral stent exchanged on 9/23rd, and pt reports bladder in continence since then. Further management as per urology as outpatient.    Metastatic adenocarcinoma of cervix: Further management as per Dr Marko Plume.    Dehydration: Fluid resuscitation. Improved.   Mild hyponatremia: probably from dehydration.  Improved.    Leukocytosis: From the sepsis. slightly worse today.  Monitor.    Anemia: macrocytic.  Probably from the malignancy.   Code Status: full code.  Family Communication: family at bedside.  Disposition Plan: remain for another day or two.    Consultants:  Oncology.   Procedures:  none  Antibiotics:  zosyn  HPI/Subjective: Fever, no new complaints.   Objective: Filed Vitals:   03/25/15 1632  BP: 122/67  Pulse: 86  Temp: 98.1 F (36.7 C)  Resp: 12    Intake/Output Summary (Last 24 hours) at 03/25/15 1744 Last data filed at 03/25/15 1629  Gross per 24 hour  Intake   3770 ml  Output    100 ml  Net   3670 ml   Filed Weights   03/23/15 1032 03/23/15 1053 03/23/15 1607  Weight: 68.493 kg (151 lb) 68.493 kg (151 lb) 71.8 kg (158 lb 4.6 oz)    Exam:   General:  Alert afebrile comfortable.   Cardiovascular: s1s2  Respiratory: ctab  Abdomen: soft mildly tender ND bs+  Musculoskeletal: no pedal edema.    Data Reviewed: Basic Metabolic Panel:  Recent Labs Lab 03/21/15 1534 03/23/15 1104 03/24/15 0457 03/25/15 0926  NA 136 133* 134* 135  K 4.3 3.5 3.6 3.2*  CL  --  97* 102 102  CO2 29 26 25 25   GLUCOSE 118 115* 102* 96  BUN 16.0 14 11 9   CREATININE 1.1 1.11* 1.10* 1.10*  CALCIUM 9.6 9.2 7.9* 8.3*   Liver Function Tests:  Recent Labs Lab 03/21/15 1534 03/23/15 1104 03/25/15 0926  AST 10 19 13*  ALT <9 12* 10*  ALKPHOS 70 69 67  BILITOT <0.30 0.9 0.4  PROT 6.8 7.3 6.1*  ALBUMIN 2.8* 3.3* 2.5*   No results for input(s): LIPASE, AMYLASE in the last 168 hours. No results for input(s): AMMONIA in the last 168 hours. CBC:  Recent Labs Lab 03/21/15 1534 03/22/15 1342 03/23/15 1104 03/24/15 0457 03/25/15 0926  WBC 11.8* 12.1* 16.1* 13.8* 18.1*  NEUTROABS 9.6* 9.8* 13.3*  --  15.4*  HGB 10.9* 10.5* 10.7* 9.0* 9.4*  HCT 33.0* 31.8* 33.4* 28.2* 28.4*  MCV 101.5* 100.4 104.0* 103.7* 103.6*  PLT 368 390 398 301 272   Cardiac Enzymes: No results for input(s): CKTOTAL, CKMB, CKMBINDEX, TROPONINI in the last 168 hours. BNP (last 3 results) No results for input(s): BNP in the last 8760 hours.  ProBNP (last 3 results) No results for input(s): PROBNP in the last 8760 hours.  CBG: No results for input(s): GLUCAP in the last 168 hours.  Recent Results (from the past  240 hour(s))  Urine culture     Status: None   Collection Time: 03/21/15  4:45 PM  Result Value Ref Range Status   Specimen Description URINE, RANDOM  Final   Special Requests PATIENT ON FOLLOWING CIPROFLOXACIN  Final   Culture   Final    NO GROWTH 2 DAYS Performed at [facility]    Report Status 03/23/2015 FINAL  Final  Blood Culture (routine x 2)     Status: None (Preliminary result)   Collection Time: 03/23/15 10:55 AM  Result Value Ref Range Status   Specimen Description BLOOD LEFT ANTECUBITAL  Final   Special Requests BOTTLES DRAWN AEROBIC AND ANAEROBIC 5CC  Final   Culture   Final     NO GROWTH 2 DAYS Performed at [facility]    Report Status PENDING  Incomplete  Blood Culture (routine x 2)     Status: None (Preliminary result)   Collection Time: 03/23/15 11:00 AM  Result Value Ref Range Status   Specimen Description BLOOD RIGHT ARM  Final   Special Requests BOTTLES DRAWN AEROBIC AND ANAEROBIC 5ML  Final   Culture   Final    NO GROWTH 2 DAYS Performed at [facility]    Report Status PENDING  Incomplete  Urine culture     Status: None   Collection Time: 03/23/15 11:03 AM  Result Value Ref Range Status   Specimen Description URINE, CATHETERIZED  Final   Special Requests NONE  Final   Culture   Final    NO GROWTH 1 DAY Performed at [facility]    Report Status 03/24/2015 FINAL  Final  Wet prep, genital     Status: Abnormal   Collection Time: 03/23/15  3:14 PM  Result Value Ref Range Status   Yeast Wet Prep HPF POC NONE SEEN NONE SEEN Final   Trich, Wet Prep NONE SEEN NONE SEEN Final   Clue Cells Wet Prep HPF POC NONE SEEN NONE SEEN Final   WBC, Wet Prep HPF POC FEW (A) NONE SEEN Final     Studies: [place] Intraoperative  03/25/2015  CLINICAL DATA:  Ultrasound was provided for use by the ordering physician, and a technical charge was applied by the performing facility.  No radiologist interpretation/professional services rendered.   Dg Abd Acute W/chest  03/25/2015  CLINICAL DATA:  Fever and lower abdominal pain EXAM: DG ABDOMEN ACUTE W/ 1V CHEST COMPARISON:  03/23/2015 FINDINGS: Normal heart size.  Left basilar atelectasis. Stable right ureteral stent. Air-fluid levels are scattered across the abdomen in small and large bowel in and ileus pattern. No free intraperitoneal gas. IMPRESSION: Mild left basilar atelectasis. Ileus pattern. Electronically Signed   By: Marybelle Killings M.D.   On: 03/25/2015 09:35    Scheduled Meds: . pantoprazole  40 mg Oral Daily  . piperacillin-tazobactam (ZOSYN)  IV  3.375 g Intravenous Q8H  . potassium chloride   40 mEq Oral BID  . senna-docusate  2 tablet Oral BID  . sodium chloride  3 mL Intravenous Q12H   Continuous Infusions: . sodium chloride 100 mL/hr at 03/25/15 1715    Active Problems:   Sepsis ([place])   Cervical cancer ([place])   Gastritis and gastroduodenitis   Pyometrium    Time spent: 25 minutes.     [facility] Pager [phone] If 7PM-7AM, please contact night-coverage at [email or web address], password [facility] 03/25/2015, 5:44 PM  LOS: 1 day

## 2015-03-25 NOTE — Anesthesia Preprocedure Evaluation (Addendum)
Anesthesia Evaluation  Patient identified by MRN, date of birth, ID band Patient awake    Reviewed: Allergy & Precautions, NPO status , Patient's Chart, lab work & pertinent test results  Airway Mallampati: II  TM Distance: >3 FB Neck ROM: Full    Dental no notable dental hx.    Pulmonary asthma ,    Pulmonary exam normal breath sounds clear to auscultation       Cardiovascular negative cardio ROS Normal cardiovascular exam Rhythm:Regular Rate:Normal     Neuro/Psych negative neurological ROS  negative psych ROS   GI/Hepatic negative GI ROS, Neg liver ROS,   Endo/Other  negative endocrine ROS  Renal/GU negative Renal ROS  negative genitourinary   Musculoskeletal negative musculoskeletal ROS (+)   Abdominal   Peds negative pediatric ROS (+)  Hematology  (+) anemia ,   Anesthesia Other Findings   Reproductive/Obstetrics negative OB ROS                             Anesthesia Physical Anesthesia Plan  ASA: II  Anesthesia Plan: General   Post-op Pain Management:    Induction: Intravenous  Airway Management Planned: LMA  Additional Equipment:   Intra-op Plan:   Post-operative Plan: Extubation in OR  Informed Consent: I have reviewed the patients History and Physical, chart, labs and discussed the procedure including the risks, benefits and alternatives for the proposed anesthesia with the patient or authorized representative who has indicated his/her understanding and acceptance.   Dental advisory given  Plan Discussed with: CRNA  Anesthesia Plan Comments:         Anesthesia Quick Evaluation                                   Anesthesia Evaluation  Patient identified by MRN, date of birth, ID band Patient awake    Reviewed: Allergy & Precautions, NPO status , Patient's Chart, lab work & pertinent test results  Airway Mallampati: II  TM Distance: >3 FB Neck  ROM: Full    Dental no notable dental hx. (+) Teeth Intact, Dental Advisory Given, Chipped,    Pulmonary asthma ,    Pulmonary exam normal breath sounds clear to auscultation       Cardiovascular negative cardio ROS Normal cardiovascular exam Rhythm:Regular Rate:Normal  10-13-14 2D Echo - Left ventricle: The cavity size was normal. Wall thickness was increased in a pattern of mild LVH. Systolic function was normal. The estimated ejection fraction was in the range of 55% to 60%. Wall motion was normal; there were no regional wall motion abnormalities. Left ventricular diastolic function parameters were normal.   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  Neuromuscular disease negative psych ROS   GI/Hepatic Neg liver ROS, GERD  Medicated,  Endo/Other  negative endocrine ROS  Renal/GU Renal disease   Hx Cervical cancer negative genitourinary   Musculoskeletal negative musculoskeletal ROS (+)   Abdominal   Peds negative pediatric ROS (+)  Hematology negative hematology ROS (+) anemia ,   Anesthesia Other Findings   Reproductive/Obstetrics negative OB ROS                           Anesthesia Physical Anesthesia Plan  ASA: II  Anesthesia Plan: General   Post-op Pain Management:    Induction: Intravenous  Airway Management Planned: LMA  Additional Equipment:  Intra-op Plan:   Post-operative Plan: Extubation in OR  Informed Consent: I have reviewed the patients History and Physical, chart, labs and discussed the procedure including the risks, benefits and alternatives for the proposed anesthesia with the patient or authorized representative who has indicated his/her understanding and acceptance.   Dental advisory given  Plan Discussed with: CRNA  Anesthesia Plan Comments: (LMA #5 10-13-14)        Anesthesia Quick Evaluation

## 2015-03-25 NOTE — Progress Notes (Signed)
MEDICAL ONCOLOGY March 25, 2015, 8:37 AM  Hospital day 3 Antibiotics: day 3 zosyn    (+ Cipro x 3-4 days PTA) Chemotherapy: cisplatinum, taxol, avastin last 03-01-15; held 10-26 and 10-27 with infection symptoms  Physicians:Emma Carita Pian, MD, Michel Bickers, Leonidas Romberg, Genia Del), Marguerita Merles, Janyth Pupa  Discussed with Dr [name] 03-24-15 after she reviewed CT and saw patient. Dr [name] wonders if the area thought on CT to be loop of ileum may be some necrotic tumor or material in uterus that may be source of fever, may need to consider draining by gyn onc.  I have LM for gyn onc now to update re fever overnight.  I have called [facility] as stent change and bladder biopsy (biopsy done in proximity to avastin, which could delay healing) were done shortly prior to start of present problem. NOTE new urinary incontinence.  EMR reviewed. Patient seen with family member here now. She has been febrile 101-102 overnight including 102 this AM   Subjective: Reports some shaking chills thru night and more pain lower quadrants and suprapubic, better with Tramadol an hour ago, also tylenol. No bowel movement since admission. Is voiding , is also incontinent of urine, cannot tell if this is from urethra or from vagina. Some nausea, has eaten very little and not taking po fluids well. No bleeding. No SOB. Not lightheaded up to BR. No other pain.   No central line Right ureteral stent Has not had flu vaccine yet   Patient had been in usual good health, with no previous abnormal PAP smears, when she developed frequent urination and vaginal bleeding in late 2015. She had endometrial biopsy by Dr Janyth Pupa [phone]) with adenocarcinoma. PET in [place] system 0-98-11 had hypermetabolic uptake within the uterine cervix and lower uterine segment and metastatic adenopathy within the bilateral pelvis and periaortic retroperitoneum. Dr Genia Del and Dr Marguerita Merles treated with pelvic and para-aortic radiation given with 6 cycles of sensitizing CDDP, and HDR x5 from 07-06-14 thru 09-03-14. Patient had a difficult time with nausea, vomiting, diarrhea and hypokalemia/ hypomagnesemia thru treatment. Patient used ODT zofran and ativan during chemotherapy. She had a difficult time doing oral prehydration for CDDP. She had no peripheral neuropathy with the CDDP. There was concern by completion of the treatment that she did not have optimal response, with plan for repeat evaluation at 6 weeks from completion of the treatment. Prior to planned reevaluation, patient began having daily fevers for possibly 4 weeks, as high as 102 degrees x 2 weeks prior to 10-11-14; by presentation at ED she also had increased right flank pain. She had [facility] at [facility] on 10-08-14, this compared with PET of 05-2014 showed increase in subcapsular fluid collection right kidney and right hydronephrosis, increase in size of endometrial canal/ uterus/ right adnexa, and improvement in retroperitoneal, common iliac and bilateral pelvic sidewall adenopathy. She was admitted to [facility] ICU 5-16/ 10-12-14 with sepsis from peptostreptococcus. She had percutaneous drainage by IR of 635 cc dark thin fluid from right perinephric area on 10-12-14, with perinephric drain removed by urology on 10-18-14. She had right ureteral stent placement by Dr Junious Silk on 10-13-14. She developed C.difficile diarrhea, begun on flagyl 10-15-14 x 2 weeks, also on ceftin during that time. She saw Dr [name] in consultation on 11-05-14, with cervix grossly normal appearance, firm to palpation with right anterior extension, and purulent material from cervical os with manipulation, no palpable adnexal masses, induration R>L parametrium. PET 11-04-14 had  hypermetabolic tissue thickening at cecum, + small right sided mesenteric and inguinal nodes, foci in myometriium and in region of cervix, small right perinephric fluid  collection and dilated fluid filled endometrium. Dr [name] saw her following the PET, with recommendation for systemic treatment of the metastatic disease with CDDP, taxol and avastin. First CDDP Taxol 11-19-14, neutropenic day 14. Repeat CT showed essentially stable, minimal right perinephric fluid on 12-06-14, prior to cycle 2 CDDP taxol 7-14 with first avastin 12-10-14 and neulasta. CT AP 02-21-15 after 4 cycles CDDP taxol/ 3 cycles avastin showed improvement, plan to continue x another 3 cycles and reevaluate.  Most recent chemotherapy was CDDP taxol avastin 03-01-15. She had cystoscopy, bladder biopsy with fulguration, right diagnostic ureteroscopy,right retrograde pyelogram, and right ureteral stent exchange by Dr Junious Silk on 02-18-15. 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 [facility], with discomfort thought bladder or low pelvis, fevers to 100, poor po intake with some nausea. Urine culture was sent 10-24 (on cipro), no growth so far. She was given IVF and had blood culture done at [facility] on 10-25 (no growth so far, no shaking chills at time of that blood culture). Temperatures were as high as 102 by 03-23-15 with shaking chills  and she was admitted 03-23-15. NOTE past urosepsis and past C diff.    Objective: Vital signs in last 24 hours: Blood pressure 136/72, pulse 102, temperature 102.3 F (39.1 C), temperature source Oral, resp. rate 18, height 5\' 4"  (1.626 m), weight 158 lb 4.6 oz (71.8 kg), last menstrual period 11/05/2011, SpO2 98 %.  Looks ill but not in acute distress, lying supine on RA. Respirations not labored. Oral mucosa clear. Lungs without wheezes or rales. NSL in LUE and IV at 100 NS in [place]. Heart RRR, tachy, no gallop. Abdomen a little more distended, soft,  tender lower quadrants, bowel sounds present. LE no edema, cords, tenderness. Moves all extremities in bed, speech fluent and appropriate.  Intake/Output from previous day: 10/27 0701 - 10/28 0700 In: 3840 [P.O.:480; I.V.:3210; IV Piggyback:150] Out: -  Intake/Output this shift:     Lab Results:  Recent Labs  03/23/15 1104 03/24/15 0457  WBC 16.1* 13.8*  HGB 10.7* 9.0*  HCT 33.4* 28.2*  PLT 398 301   BMET  Recent Labs  03/23/15 1104 03/24/15 0457  NA 133* 134*  K 3.5 3.6  CL 97* 102  CO2 26 25  GLUCOSE 115* 102*  BUN 14 11  CREATININE 1.11* 1.10*  CALCIUM 9.2 7.9*   Blood cultures x 2 from admission 10-26 no growth (on cipro) Urine culture 10-24 no growth (on cipro)  I have ordered CBC diff and CMET now   Studies/Results:  I have ordered acute abdomen xray now    Ct Abdomen Pelvis W Contrast  03/23/2015  CLINICAL DATA:  54 year old female with pelvic pain and fever for 6 days. Cervical cancer chemotherapy in progress. Subsequent encounter. EXAM: CT ABDOMEN AND PELVIS WITH CONTRAST TECHNIQUE: Multidetector CT imaging of the abdomen and pelvis was performed using the standard protocol following bolus administration of intravenous contrast. CONTRAST:  171mL OMNIPAQUE IOHEXOL 300 MG/ML  SOLN COMPARISON:  CT Abdomen and Pelvis 02/21/2015 and earlier. FINDINGS: Negative lung bases.  No pericardial or pleural effusion. Stable visualized osseous structures. Stable presacral stranding or trace fluid. Rectum remains  within normal limits. Stable post treatment appearance of the uterus, cervix and vagina compared to September. Stable all appearance of the at adnexa and right gonadal vein. Sigmoid colon remains within normal limits. Retained stool throughout the low left colon and transverse colon. Oral contrast has reached the right colon. There is continued circumferential wall thickening in the distal ileum looped in the right lower quadrant located adjacent to the uterus and  right adnexa (series 2, image 64). The terminal ileum itself appears relatively spared. No dilated small bowel. Decompressed stomach and duodenum. No abdominal free fluid or free air. Surgically absent gallbladder. Liver, spleen, pancreas, and adrenal glands remain within normal limits. Portal venous system is patent. Major arterial structures in the abdomen and pelvis remain patent. Previously identified right lower quadrant mesenteric lymph nodes are stable to mildly regressed (e.g. Series 2, image 44 today versus same series and image previously). No new or increased lymphadenopathy. Both kidneys are stable since September. Mild prominence of the left renal collecting system but preserved delayed contrast excretion. Right double-J ureteral stent remains in place. No gas within the bladder today. Right renal subcapsular fluid collection is unchanged. Right renal enhancement and contrast excretion is stable. Stable all ventral lower abdominal incisional scar. Stable all inguinal lymph nodes. IMPRESSION: No finding identified to explain new fever. Stable CT abdomen and pelvis since the restaging study on 02/21/2015 (please see that report). Electronically Signed   By: Genevie Ann M.D.   On: 03/23/2015 14:49   Dg Chest Port 1 View  03/23/2015  CLINICAL DATA:  54 year old female with fever, nausea and vomiting. History of cervical cancer. EXAM: PORTABLE CHEST 1 VIEW COMPARISON:  10/11/2014 chest radiograph FINDINGS: The cardiomediastinal silhouette is unremarkable. There is no evidence of focal airspace disease, pulmonary edema, suspicious pulmonary nodule/mass, pleural effusion, or pneumothorax. No acute bony abnormalities are identified. IMPRESSION: No active disease. Electronically Signed   By: Margarette Canada M.D.   On: 03/23/2015 11:43          Assessment/Plan: 1. Fever, chills, lower abdominal/ pelvic discomfort not improved with empiric cipro and other outpatient interventions. Presumed urinary source  initially, tho no cultures confirm yet, and concern from gyn onc that may be related to . Admitted with higher fever and shaking chills, no improvement on zosyn with higher fever overnight.. CT as above, thickened ? loop of ileum in pelvis vs necrotic uterine/ cervical tumor, no recurrent subcapsular fluid on right kidney I have asked urology to see, as bladder biopsy sites may not heal as well as usual due to recent avastin, new urinary incontinence and the recent stent change. Reviewed by gyn oncology on 10-27 and I have LM to update them now re higher fevers. Labs and acute abdomen xray ordered now. Should antibiotics be adjusted for possible endometrial/ cervical tumor source? 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 Improvement with CDDP taxol avastin by CT 02-21-15, with 3 additional cycles planned last treatment 03-01-15. Hold chemo and avastin with present problem. WBC/ ANC not low; anemia with chemo, blood loss/ 3. History of urosepsis and C diff spring 2016 4.No central catheter at patient's preference 5.previous hypoK and hypoMg related to CDDP, which has needed supplementation 6.intolerance morphine and codeine. OK with tramadol now 7.left shoulder possible rotator cuff tear, which she has not wanted to address with rest of situation 8.history of asthma, none x months 9.no  flu vaccine yet, hold with present infection 10.chemo peripheral neuropathy especially in feet, stable 11.post cholecystectomy    Please page me [phone] if I can assist between my rounds, or call med onc MD on call  LIVESAY,LENNIS P

## 2015-03-25 NOTE — Anesthesia Postprocedure Evaluation (Signed)
  Anesthesia Post-op Note  Patient: Stacy Webster  Procedure(s) Performed: Procedure(s) (LRB): DILATATION AND CURETTAGE WITH ULTRASOUND (N/A)  Patient Location: PACU  Anesthesia Type: General  Level of Consciousness: awake and alert   Airway and Oxygen Therapy: Patient Spontanous Breathing  Post-op Pain: mild  Post-op Assessment: Post-op Vital signs reviewed, Patient's Cardiovascular Status Stable, Respiratory Function Stable, Patent Airway and No signs of Nausea or vomiting  Last Vitals:  Filed Vitals:   03/25/15 1632  BP: 122/67  Pulse: 86  Temp: 36.7 C  Resp: 12    Post-op Vital Signs: stable   Complications: No apparent anesthesia complications

## 2015-03-25 NOTE — Consult Note (Signed)
Consult Note: [name]  Consult was requested by [name] for the evaluation of Stacy Webster 54 y.o. female with fevers, sepsis in the setting of recent chemotherapy for advanced cervical cancer.  CC:  Chief Complaint  Patient presents with  . Ca pt, Fever     Assessment/Plan:  Ms. Stacy MERIWEATHER  is a 54 y.o.  year old with advanced cervical cancer (last dose of chemotherapy with cddp, taxol and bevacizumab was 03/01/15) and sepsis of unclear origin. She has had a radiographically complete respose to salvage therapy.  The source is most likely GU given her history of right ureteral obstruction, stent changes and perinephric abscesses. Urine cultures from this admission are negative, however she was on cipro when those were taken.  Blood cultures negative and she has no port in place.  Bowel wall thickening on imaging likely represents radiation changes and tumor infiltration, and does not appear consistent with colitis. She has no evidence of perforation or obstruction.  The only potential gyn source for infection might be the fluid collection in the uterus which is secondary to cervical stenosis and hematometria. However, this appears stable on imaging with no surrounding stranding. Given the loop of bowel in close proximity with uterus (and her hx of bevacizumab use) this discharge could be fistula formation between ileum and uterus, though typically fistulae do not present with febrile morbidity.  Recommend broadening coverage of antibiotics, eg add Vanc and consider fungal coverage. Operative procedures (particularly GI and laparotomies) should be avoided in this patient with recent history of avastin and extremely high risk for delayed wound healing and non-wound/anastamosis healing.  Will consider ultrasound guided D&C to evacuate uterine cavity if her course does not improve. Discussed with the patient that this carries with it the risk of perforation and complication (to GI or  GU structures).   HPI: Stacy Webster is a 54 year old woman with a history of stage II B cervical adenocarcinoma. She was diagnosed with this condition in December of 2015. She was seen initially by Dr Genia Del in [place]. At the time of diagnosis she had positive lymph nodes in the pelvis on CT imaging. She was treated with primary chemo-radiation which was completed on April 8th. The patient reports completing the course of therapy on schedule with no delays.  She reports that when she completed therapy there was some concern by her treating physicians that her response had been incomplete. They had planned to wait until she was further out from therapy before obtaining a PET scan to evaluate for response.   In May, 2016 she developed febrile morbidity and manifestionas of sepsis. She presented to the [facility] ER and was admitted and noted to have a urinary tract infection and right ureteral obstruction which was new. She was treated with pressor support, IV antibiotics, and a right perinephric drain for the right perinephric abscess.   On June 9th, 2016 she underwent PET/CT to evaluate for recurrent disease as the cause of her new right ureteral obstruction (CT had not clearly shown recurrence/progression). It demonstrated:  There is irregular soft tissue thickening along the posterior wall of the cecum which is hypermetabolic. SUV max is 6.8. This is worrisome for tumor infiltration. There are also metabolically active small mesenteric lymph nodes near the right colon (largest node measures 7 mm). The uterus remains enlarged with marked dilatation of the endometrial canal which is filled with fluid. This is likely due to cervical obstruction from the patient's cervical cancer. Several  foci of hypermetabolism are noted in the myometrium. Could not exclude tumor implants. This could be residual tumor or radiation change. There are small pelvic sidewall lymph nodes but they are not  hypermetabolic. There are borderline enlarged inguinal lymph nodes which are hypermetabolic.   She was started on salvage chemotherapy with cisplatin, paclitaxel and bevacizumab on 11/19/14.   She has completed 3 cycles of cddp/taxol/bev before repeat imaging with CT scan of the chest abdomen and pelvis performed on 02/21/2015 revealed no new lesions, mild prominence and enhancement of the cervix consistent with treatment effect, small residual ileocolic and right abdominal mesenteric lymph nodes measuring 4-6 mm mildly improved. No distant metastasis. Right renal atrophy with resolving subcapsular seroma and hematoma.   She went on to receive an additional 2 cycles (of a planned 3) of cddp/taxol/bevacizumab with the last cycle on 03/01/15).   Interval History: She underwent right ureteral stent change on September 29th. She developed symptoms of low pelvic pain in early October and was empirically treated with cipro with no resolution. She also began noticing vaginal discharge with a foul odor (scan amount) consistent with a "broccoli" smell.  She was admitted to [facility] via the ED on 03/23/15 with presumed sepsis from complicated urinary tract infection. She was febrile with lower cramping abdominal pain and fevers to 102. Urine and blood cultures on admission were negative for growth (though she was on cipro at the time).  CT of the abdo/pelvis on admission 03/23/15 showed: Stable post treatment appearance of the uterus, cervix and vagina compared to September. Stable all appearance of the at adnexa and right gonadal vein. Sigmoid colon remains within normal limits. Retained stool throughout the low left colon and transverse colon. Oral contrast has reached the right colon. There is continued circumferential wall thickening in the distal ileum looped in the right lower quadrant located adjacent to the uterus and right adnexa. The terminal ileum itself appears relatively spared. No dilated small bowel.  Decompressed stomach and duodenum.  She was empirically started on zosyn IV, but has continued to be febrile intermittently since admission with a rising WBC (today is 18,000).  Current Meds:  Current Facility-Administered Medications on File Prior to Encounter  Medication Dose Route Frequency Provider Last Rate Last Dose  . dextrose 5 % and 0.9% NaCl 1,000 mL with potassium chloride 20 mEq, magnesium sulfate 12 mEq, mannitol 12.5 g infusion   Intravenous Continuous Lennis Marion Downer, MD   Stopped at 03/01/15 1758   Current Outpatient Prescriptions on File Prior to Encounter  Medication Sig Dispense Refill  . dexamethasone (DECADRON) 4 MG tablet Take 5 tablets with food 12 hrs and 6 hrs prior to Taxol Chemotherapy 20 tablet 0  . diphenoxylate-atropine (LOMOTIL) 2.5-0.025 MG per tablet Take 1 tablet by mouth as needed for diarrhea or loose stools. Can take up to 8 times daily  0  . docusate sodium (COLACE) 100 MG capsule Take 100 mg by mouth daily as needed for mild constipation.     Marland Kitchen HEMOCYTE 324 (106 FE) MG TABS Take 325 mg by mouth daily. Take 1 tablet daily on empty stomach with Vitamin C tablet 30 tablet 3  . ibuprofen (ADVIL,MOTRIN) 200 MG tablet Take 400 mg by mouth every 8 (eight) hours as needed for headache or moderate pain.     Marland Kitchen lactose free nutrition (BOOST PLUS) LIQD Take 237 mLs by mouth 3 (three) times daily with meals. (Patient taking differently: Take 237 mLs by mouth daily. ) 60 Can 0  .  loratadine (CLARITIN) 10 MG tablet Take 10 mg by mouth daily as needed for allergies.     Marland Kitchen LORazepam (ATIVAN) 1 MG tablet Place 1/2 - 1 tablet under the tongue or swallow evry 4-6 hrs as needed for nausea 30 tablet 0  . pantoprazole (PROTONIX) 40 MG tablet Take 1 tablet (40 mg total) by mouth daily. 30 tablet 2  . phenazopyridine (PYRIDIUM) 200 MG tablet take 1 tablet by mouth three times a day FOR BURNING  0  . potassium chloride (K-DUR,KLOR-CON) 10 MEQ tablet Take 10 mEq by mouth daily.     . vitamin C (ASCORBIC ACID) 500 MG tablet Take 500 mg by mouth daily.    . ondansetron (ZOFRAN-ODT) 8 MG disintegrating tablet Take 1 tablet (8 mg total) by mouth every 8 (eight) hours as needed for nausea or vomiting. 30 tablet 2     Allergy:  Allergies  Allergen Reactions  . Morphine And Related Rash    Per pt, morphine also makes her vomit  . Codeine Nausea Only  . Hydrocodone Nausea And Vomiting  . Peach [Prunus Persica] Swelling and Rash    Social Hx:   Social History   Social History  . Marital Status: Married    Spouse Name: N/A  . Number of Children: N/A  . Years of Education: N/A   Occupational History  . Not on file.   Social History Main Topics  . Smoking status: Never Smoker   . Smokeless tobacco: Never Used  . Alcohol Use: No  . Drug Use: No  . Sexual Activity: Not on file   Other Topics Concern  . Not on file   Social History Narrative    Past Surgical Hx:  Past Surgical History  Procedure Laterality Date  . Cystoscopy w/ ureteral stent placement Right 10/13/2014    Procedure: CYSTOSCOPY WITH RETROGRADE PYELOGRAM/URETERAL STENT PLACEMENT;  Surgeon: Festus Aloe, MD;  Location: [facility];  Service: Urology;  Laterality: Right;  . Transthoracic echocardiogram  10-13-2014    mild LVH,  ef 55-60%/  mild TR  . Tonsillectomy and adenoidectomy  age 60    age 72--  redo-- Adenoidectomy  . Laparoscopic cholecystectomy  1990's  . Cystoscopy with ureteroscopy and stent placement Right 02/18/2015    Procedure: CYSTO WITH RIGHT URETERAL STENT EXCHANGE, RIGHT URETEROSCOPY ;  Surgeon: Festus Aloe, MD;  Location: [facility];  Service: Urology;  Laterality: Right;  . Cystoscopy with biopsy N/A 02/18/2015    Procedure: CYSTOSCOPY WITH  BLADDER  BIOPSY;  Surgeon: Festus Aloe, MD;  Location: [facility];  Service: Urology;  Laterality: N/A;  . Cystoscopy w/ retrogrades Right 02/18/2015    Procedure: CYSTOSCOPY WITH RETROGRADE  PYELOGRAM;  Surgeon: Festus Aloe, MD;  Location: [facility];  Service: Urology;  Laterality: Right;    Past Medical Hx:  Past Medical History  Diagnosis Date  . Hydronephrosis, right   . Ureteral stricture, right   . History of septic shock     05/ 2016  due to Peptostriptococcus/  right hydronephrosis and obstruction----  resolved  . 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 Mild intermittent asthma   . History of Clostridium difficile     10-15-2014  resolved  . Left rotator cuff tear arthropathy   . White coat syndrome without hypertension   . GERD (gastroesophageal reflux disease)   . Frequency of urination   . Anemia associated with chemotherapy  Antineoplastic chemo  . Recurrent cervical cancer [facility]) oncologist-  dr Berley Gambrell/ dr Marko Plume    dx 12/ 2015---  Radiation and Chemo (07-06-2014 to 09-03-2014)  now recurrent ,  Stage IIB  w/ Mets to pelvic/ retroperitoneum---  CHEMOTHERAPY CURRENTLY  . 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 Cervical cancer [facility])     Past Gynecological History: cervical cancer Patient's last menstrual period was 11/05/2011.  Family Hx: No family history on file.  Review of Systems:  Constitutional  Feels generally unwell with discomfort and malaise  ENT Normal appearing ears and nares bilaterally Skin/Breast  No rash, sores, jaundice, itching, dryness Cardiovascular  No chest pain, shortness of breath, or edema  Pulmonary  No cough or wheeze.  Gastro Intestinal  No N&V. + crampy abdominal pains. No diarrhea. + flatus Genito Urinary  No frequency, urgency, dysuria,+ Passage of small volume of foul smelling discharge. Musculo Skeletal  No myalgia, arthralgia, joint swelling or pain  Neurologic  No weakness, numbness, change in gait,  Psychology  No depression, anxiety, insomnia.   Vitals:  Blood pressure 136/72, pulse 102, temperature 98.6 F (37 C), temperature source Oral, resp. rate 18,  height 5\' 4"  (1.626 m), weight 158 lb 4.6 oz (71.8 kg), last menstrual period 11/05/2011, SpO2 98 %.  Physical Exam: WD in NAD Neck  Supple NROM, without any enlargements.  Lymph Node Survey No cervical supraclavicular or inguinal adenopathy Skin  No rash/lesions/breakdown  Psychiatry  Alert and oriented to person, place, and time  Abdomen  Normoactive bowel sounds, abdomen soft, non-tender and thin without evidence of hernia. No masses appreciated. Back No CVA tenderness Genito Urinary  Deferred as patient was examined in hospital bed without capability for Gyn exam. Extremities  No bilateral cyanosis, clubbing or edema.   Donaciano Eva, MD  03/25/2015, 10:10 AM

## 2015-03-25 NOTE — Op Note (Signed)
PATIENT: Stacy Webster DATE: 03/25/15   Preop Diagnosis: sepsis, cervical cancer, pyometrium  Postoperative Diagnosis: same  Surgery: ultrasound guided dilation and curettage  Surgeons:  Donaciano Eva, MD Assistant: none  Anesthesia: General   Estimated blood loss: <37ml  IVF:  224ml   Urine output: 010 ml   Complications: None   Pathology: endometrial curettings, cervical biopsy, anaerobic and aerobic cultures from endometrial cavity.  Operative findings: 12 week size distended uterus with distended endometrial canal with small amount of fluid. Fibroid in myometrium (fundus). Cervix slightly nodular and fixed anteriorally. Difficult to visualize with speculum due to fixation anteriorally. Uterus sounded to 12cm. Small amount of yellow fluid evacuated from uterus.   Procedure: The patient was identified in the preoperative holding area. Informed consent was signed on the chart. Patient was seen history was reviewed and exam was performed.   The patient was then taken to the operating room and placed in the supine position with SCD hose on. General anesthesia was then induced without difficulty. She was then placed in the dorsolithotomy position. The perineum was prepped with Betadine. The vagina was prepped with Betadine. The patient was then draped after the prep was dried.   Timeout was performed the patient, procedure, antibiotic, allergy, and length of procedure.   The weighted speculum was placed in the posterior vagina. The cervix was not visible due to its location on the anterior wall of the cervix.  The ultrasound was applied to the abdominal wall and the uterus was easily visualized with the endometrial canal (dilated) and the cervical canal easily seen. Under real-time guidance with the ultrasound, the uterine probe was easily passed through the cervical os and into the uterus to the fundus at 12cm. The cervix was progressively dilated to 51mm using  pratts dilators and using real-time US guidance with constant view of the instrument in the sagital plane entering the endometrial cavity and not breaching the fundus. At this time a yankar suction tip was able to be inserted under US guidance into the endometrial cavity and suction was applied retrieving a small amount (approximately 20cc) of yellow thick fluid.  The #2 endometrial curette was used to gently curette the uterine fundus under constant US guidance. This was sent for pathology. The cervix was grasped with an allis clamp and a small fragment of tissue was obtained and sent for pathology. Anaerobic and aerobic swabs were gently inserted into the endometrial cavity under US guidance to obtain bacterial cultures.   An in and out catheterization to empty the bladder was performed under sterile conditions.  The vagina was irrigated.  All instrument, suture, laparotomy, Ray-Tec, and needle counts were correct x2. The patient tolerated the procedure well and was taken recovery room in stable condition. This is Stacy Webster dictating an operative note on Stacy Webster.  Donaciano Eva, MD

## 2015-03-26 LAB — BASIC METABOLIC PANEL
Anion gap: 6 (ref 5–15)
BUN: 14 mg/dL (ref 6–20)
CHLORIDE: 107 mmol/L (ref 101–111)
CO2: 26 mmol/L (ref 22–32)
CREATININE: 1 mg/dL (ref 0.44–1.00)
Calcium: 8.2 mg/dL — ABNORMAL LOW (ref 8.9–10.3)
GFR calc Af Amer: >60 mL/min (ref >60)
GFR calc non Af Amer: >60 mL/min (ref >60)
GLUCOSE: 181 mg/dL — AB (ref 65–99)
POTASSIUM: 4.3 mmol/L (ref 3.5–5.1)
Sodium: 139 mmol/L (ref 135–145)

## 2015-03-26 LAB — CBC
HCT: 26.1 % — ABNORMAL LOW (ref 36.0–46.0)
HEMOGLOBIN: 8.3 g/dL — AB (ref 12.0–15.0)
MCH: 32.3 pg (ref 26.0–34.0)
MCHC: 31.8 g/dL (ref 30.0–36.0)
MCV: 101.6 fL — AB (ref 78.0–100.0)
Platelets: 295 10*3/uL (ref 150–400)
RBC: 2.57 MIL/uL — AB (ref 3.87–5.11)
RDW: 15.2 % (ref 11.5–15.5)
WBC: 13.4 10*3/uL — ABNORMAL HIGH (ref 4.0–10.5)

## 2015-03-26 MED ORDER — DIPHENHYDRAMINE HCL 25 MG PO CAPS
25.0000 mg | ORAL_CAPSULE | Freq: Once | ORAL | Status: AC
  Administered 2015-03-26: 25 mg via ORAL
  Filled 2015-03-26: qty 1

## 2015-03-26 NOTE — Progress Notes (Signed)
ANTIBIOTIC CONSULT NOTE  Pharmacy Consult for Zosyn Indication: rule out sepsis  Allergies  Allergen Reactions  . Morphine And Related Rash    Per pt, morphine also makes her vomit  . Codeine Nausea Only  . Hydrocodone Nausea And Vomiting  . Peach [Prunus Persica] Swelling and Rash    Patient Measurements: Height: 5\' 4"  (162.6 cm) Weight: 158 lb 4.6 oz (71.8 kg) IBW/kg (Calculated) : 54.7 Adjusted Body Weight:   Vital Signs: Temp: 97.7 F (36.5 C) (10/29 1315) Temp Source: Oral (10/29 1315) BP: 118/65 mmHg (10/29 1315) Pulse Rate: 84 (10/29 1315) Intake/Output from previous day: 10/28 0701 - 10/29 0700 In: 2960 [P.O.:60; I.V.:2750; IV Piggyback:150] Out: 100 [Urine:100] Intake/Output from this shift: Total I/O In: 240 [P.O.:240] Out: -   Labs:  Recent Labs  03/24/15 0457 03/25/15 0926 03/26/15 0558  WBC 13.8* 18.1* 13.4*  HGB 9.0* 9.4* 8.3*  PLT 301 272 295  CREATININE 1.10* 1.10* 1.00   Estimated Creatinine Clearance: 62.4 mL/min (by C-G formula based on Cr of 1). No results for input(s): VANCOTROUGH, VANCOPEAK, VANCORANDOM, GENTTROUGH, GENTPEAK, GENTRANDOM, TOBRATROUGH, TOBRAPEAK, TOBRARND, AMIKACINPEAK, AMIKACINTROU, AMIKACIN in the last 72 hours.   Microbiology: Recent Results (from the past 720 hour(s))  Urine culture     Status: None   Collection Time: 03/21/15  4:45 PM  Result Value Ref Range Status   Specimen Description URINE, RANDOM  Final   Special Requests PATIENT ON FOLLOWING CIPROFLOXACIN  Final   Culture   Final    NO GROWTH 2 DAYS Performed at [facility]    Report Status 03/23/2015 FINAL  Final  Blood Culture (routine x 2)     Status: None (Preliminary result)   Collection Time: 03/23/15 10:55 AM  Result Value Ref Range Status   Specimen Description BLOOD LEFT ANTECUBITAL  Final   Special Requests BOTTLES DRAWN AEROBIC AND ANAEROBIC 5CC  Final   Culture   Final    NO GROWTH 3 DAYS Performed at [facility]    Report Status PENDING  Incomplete  Blood Culture (routine x 2)     Status: None (Preliminary result)   Collection Time: 03/23/15 11:00 AM  Result Value Ref Range Status   Specimen Description BLOOD RIGHT ARM  Final   Special Requests BOTTLES DRAWN AEROBIC AND ANAEROBIC 5ML  Final   Culture   Final    NO GROWTH 3 DAYS Performed at [facility]    Report Status PENDING  Incomplete  Urine culture     Status: None   Collection Time: 03/23/15 11:03 AM  Result Value Ref Range Status   Specimen Description URINE, CATHETERIZED  Final   Special Requests NONE  Final   Culture   Final    NO GROWTH 1 DAY Performed at [facility]    Report Status 03/24/2015 FINAL  Final  Wet prep, genital     Status: Abnormal   Collection Time: 03/23/15  3:14 PM  Result Value Ref Range Status   Yeast Wet Prep HPF POC NONE SEEN NONE SEEN Final   Trich, Wet Prep NONE SEEN NONE SEEN Final   Clue Cells Wet Prep HPF POC NONE SEEN NONE SEEN Final   WBC, Wet Prep HPF POC FEW (A) NONE SEEN Final  Anaerobic culture     Status: None (Preliminary result)   Collection Time: 03/25/15  3:50 PM  Result Value Ref Range Status   Specimen Description WOUND UTERINE FLUID  Final   Special Requests PATIENT  ON FOLLOWING ZOSYN AND VANCOMYCIN  Final   Gram Stain   Final    NO WBC SEEN NO SQUAMOUS EPITHELIAL CELLS SEEN NO ORGANISMS SEEN Performed at [facility]    Culture PENDING  Incomplete   Report Status PENDING  Incomplete  Wound culture     Status: None (Preliminary result)   Collection Time: 03/25/15  3:50 PM  Result Value Ref Range Status   Specimen Description WOUND ENDOMETRIAL/UTERINE FLUID  Final   Special Requests PATIENT ON FOLLOWING ZOSYN AND VANCOMYCIN  Final   Gram Stain   Final    NO WBC SEEN NO SQUAMOUS EPITHELIAL CELLS SEEN NO ORGANISMS SEEN Performed at [facility]    Culture PENDING  Incomplete   Report Status PENDING  Incomplete    Medical History: Past Medical  History  Diagnosis Date  . Hydronephrosis, right   . Ureteral stricture, right   . History of septic shock     05/ 2016  due to Peptostriptococcus/  right hydronephrosis and obstruction----  resolved  . 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 Mild intermittent asthma   . History of Clostridium difficile     10-15-2014  resolved  . Left rotator cuff tear arthropathy   . White coat syndrome without hypertension   . GERD (gastroesophageal reflux disease)   . Frequency of urination   . Anemia associated with chemotherapy     Antineoplastic chemo  . Recurrent cervical cancer [facility]) oncologist-  dr Rossi/ dr Marko Plume    dx 12/ 2015---  Radiation and Chemo (07-06-2014 to 09-03-2014)  now recurrent ,  Stage IIB  w/ Mets to pelvic/ retroperitoneum---  CHEMOTHERAPY CURRENTLY  . 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 Cervical cancer (HCC)     Medications:  Scheduled:  . pantoprazole  40 mg Oral Daily  . piperacillin-tazobactam (ZOSYN)  IV  3.375 g Intravenous Q8H  . senna-docusate  2 tablet Oral BID  . sodium chloride  3 mL Intravenous Q12H   Assessment: Pt is a 54 yo female with concern for urosepsis. Pt PMH significant for metastatic cervical cancer. Last chemo was about three weeks ago. Next round of chemo has been held due to elevated fevers. Pt was given rx for Cipro which did not improve symptoms. Pharmacy consulted to dose zosyn for intra-abd infection.   Labs:  Tm 102.3 (Tc 7.7) WBC improving SCr also improved, CrCl now 62 (N73)  PTA 10/23 >> Cipro >> 10/26 10/26 >> Zosyn >>   10/26 blood x 2:NGTD  10/26 urine: NGF (after cipro) 10/28 wound uterine fluid (after zoysn): NGTD 10/28 wound endometrial/uterine fluid (after zosyn): NGTD  Goal of Therapy:  Eradication of infection   Plan:  Follow up culture results  Continue Zosyn 3.375g IV q8h (infuse over 4 hours)   Ralene Bathe, PharmD, BCPS 03/26/2015, 3:14 PM  Pager: [phone]

## 2015-03-26 NOTE — Progress Notes (Signed)
[facility] PROGRESS NOTE  JALEEYA MCNELLY XIP:[id number] DOB: March 14, 1961 DOA: 03/23/2015 PCP: [name], MD  Assessment/Plan: Sepsis from urinary source; she was febrile, hypotensive and tachycardic on arrival, with abnormal UA and elevated lactic acid.  Admitted to telemetry.  Received 2.5 lit fluid boluses and her lactic acid normalized.  Started her on IV zosyn and got urine and blood cultures which have been negative so far.  Resume zosyn. Improving.  Persistent spiking of fevers and gyn onc consulted.  Patient underwent uncomplicated D&C and suction evacuation of a small amount of pyometrium. Culture and biopsies are sent .  Her fever has subsided, and leukocytosis is improving.     Right ureteral stent exchanged on 9/23rd, and pt reports bladder in continence since then. Further management as per urology as outpatient.    Metastatic adenocarcinoma of cervix: Further management as per Dr Marko Plume.    Dehydration: Fluid resuscitation. Improved.   Mild hyponatremia: probably from dehydration.  Resolved with hydration.    Leukocytosis: From the sepsis. improved.  Monitor.    Anemia: macrocytic.  Probably from the malignancy.   Code Status: full code.  Family Communication: family at bedside.  Disposition Plan: possible d/c home on Monday.   Consultants: Oncology.  Gyn oncology. Procedures:  none  Antibiotics:  zosyn  HPI/Subjective: No nausea, vomiting or abdominal pains  Objective: Filed Vitals:   03/26/15 1315  BP: 118/65  Pulse: 84  Temp: 97.7 F (36.5 C)  Resp: 16    Intake/Output Summary (Last 24 hours) at 03/26/15 1317 Last data filed at 03/26/15 0900  Gross per 24 hour  Intake   3140 ml  Output    100 ml  Net   3040 ml   Filed Weights   03/23/15 1032 03/23/15 1053 03/23/15 1607  Weight: 68.493 kg (151 lb) 68.493 kg (151 lb) 71.8 kg (158 lb 4.6 oz)    Exam:   General:  Alert afebrile comfortable.    Cardiovascular: s1s2  Respiratory: ctab  Abdomen: soft mildly tender ND bs+  Musculoskeletal: no pedal edema.   Data Reviewed: Basic Metabolic Panel:  Recent Labs Lab 03/21/15 1534 03/23/15 1104 03/24/15 0457 03/25/15 0926 03/26/15 0558  NA 136 133* 134* 135 139  K 4.3 3.5 3.6 3.2* 4.3  CL  --  97* 102 102 107  CO2 29 26 25 25 26   GLUCOSE 118 115* 102* 96 181*  BUN 16.0 14 11 9 14   CREATININE 1.1 1.11* 1.10* 1.10* 1.00  CALCIUM 9.6 9.2 7.9* 8.3* 8.2*   Liver Function Tests:  Recent Labs Lab 03/21/15 1534 03/23/15 1104 03/25/15 0926  AST 10 19 13*  ALT <9 12* 10*  ALKPHOS 70 69 67  BILITOT <0.30 0.9 0.4  PROT 6.8 7.3 6.1*  ALBUMIN 2.8* 3.3* 2.5*   No results for input(s): LIPASE, AMYLASE in the last 168 hours. No results for input(s): AMMONIA in the last 168 hours. CBC:  Recent Labs Lab 03/21/15 1534 03/22/15 1342 03/23/15 1104 03/24/15 0457 03/25/15 0926 03/26/15 0558  WBC 11.8* 12.1* 16.1* 13.8* 18.1* 13.4*  NEUTROABS 9.6* 9.8* 13.3*  --  15.4*  --   HGB 10.9* 10.5* 10.7* 9.0* 9.4* 8.3*  HCT 33.0* 31.8* 33.4* 28.2* 28.4* 26.1*  MCV 101.5* 100.4 104.0* 103.7* 103.6* 101.6*  PLT 368 390 398 301 272 295   Cardiac Enzymes: No results for input(s): CKTOTAL, CKMB, CKMBINDEX, TROPONINI in the last 168 hours. BNP (last 3 results) No results for input(s): BNP in the last 8760 hours.  ProBNP (last 3 results) No results for input(s): PROBNP in the last 8760 hours.  CBG: No results for input(s): GLUCAP in the last 168 hours.  Recent Results (from the past 240 hour(s))  Urine culture     Status: None   Collection Time: 03/21/15  4:45 PM  Result Value Ref Range Status   Specimen Description URINE, RANDOM  Final   Special Requests PATIENT ON FOLLOWING CIPROFLOXACIN  Final   Culture   Final    NO GROWTH 2 DAYS Performed at [facility]    Report Status 03/23/2015 FINAL  Final  Blood Culture (routine x 2)     Status: None (Preliminary  result)   Collection Time: 03/23/15 10:55 AM  Result Value Ref Range Status   Specimen Description BLOOD LEFT ANTECUBITAL  Final   Special Requests BOTTLES DRAWN AEROBIC AND ANAEROBIC 5CC  Final   Culture   Final    NO GROWTH 2 DAYS Performed at [facility]    Report Status PENDING  Incomplete  Blood Culture (routine x 2)     Status: None (Preliminary result)   Collection Time: 03/23/15 11:00 AM  Result Value Ref Range Status   Specimen Description BLOOD RIGHT ARM  Final   Special Requests BOTTLES DRAWN AEROBIC AND ANAEROBIC 5ML  Final   Culture   Final    NO GROWTH 2 DAYS Performed at [facility]    Report Status PENDING  Incomplete  Urine culture     Status: None   Collection Time: 03/23/15 11:03 AM  Result Value Ref Range Status   Specimen Description URINE, CATHETERIZED  Final   Special Requests NONE  Final   Culture   Final    NO GROWTH 1 DAY Performed at [facility]    Report Status 03/24/2015 FINAL  Final  Wet prep, genital     Status: Abnormal   Collection Time: 03/23/15  3:14 PM  Result Value Ref Range Status   Yeast Wet Prep HPF POC NONE SEEN NONE SEEN Final   Trich, Wet Prep NONE SEEN NONE SEEN Final   Clue Cells Wet Prep HPF POC NONE SEEN NONE SEEN Final   WBC, Wet Prep HPF POC FEW (A) NONE SEEN Final     Studies: [place] Intraoperative  03/25/2015  CLINICAL DATA:  Ultrasound was provided for use by the ordering physician, and a technical charge was applied by the performing facility.  No radiologist interpretation/professional services rendered.   Dg Abd Acute W/chest  03/25/2015  CLINICAL DATA:  Fever and lower abdominal pain EXAM: DG ABDOMEN ACUTE W/ 1V CHEST COMPARISON:  03/23/2015 FINDINGS: Normal heart size.  Left basilar atelectasis. Stable right ureteral stent. Air-fluid levels are scattered across the abdomen in small and large bowel in and ileus pattern. No free intraperitoneal gas. IMPRESSION: Mild left basilar atelectasis. Ileus  pattern. Electronically Signed   By: Marybelle Killings M.D.   On: 03/25/2015 09:35    Scheduled Meds: . pantoprazole  40 mg Oral Daily  . piperacillin-tazobactam (ZOSYN)  IV  3.375 g Intravenous Q8H  . senna-docusate  2 tablet Oral BID  . sodium chloride  3 mL Intravenous Q12H   Continuous Infusions: . sodium chloride 100 mL/hr at 03/26/15 1215    Active Problems:   Sepsis ([place])   Cervical cancer (HCC)   Gastritis and gastroduodenitis   Pyometrium    Time spent: 25 minutes.     [facility] Pager [phone] If 7PM-7AM, please contact  night-coverage at [email or web address], password [facility] 03/26/2015, 1:17 PM  LOS: 2 days

## 2015-03-26 NOTE — Progress Notes (Signed)
Stacy Webster is a 54 y.o. female patient. 1. Sepsis, due to unspecified organism ([place])   2. Gastritis and gastroduodenitis   3. Fever   4. Surgery, elective    Past Medical History  Diagnosis Date  . Hydronephrosis, right   . Ureteral stricture, right   . History of septic shock     05/ 2016  due to Peptostriptococcus/  right hydronephrosis and obstruction----  resolved  . 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 Mild intermittent asthma   . History of Clostridium difficile     10-15-2014  resolved  . Left rotator cuff tear arthropathy   . White coat syndrome without hypertension   . GERD (gastroesophageal reflux disease)   . Frequency of urination   . Anemia associated with chemotherapy     Antineoplastic chemo  . Recurrent cervical cancer [facility]) oncologist-  dr Rossi/ dr Marko Plume    dx 12/ 2015---  Radiation and Chemo (07-06-2014 to 09-03-2014)  now recurrent ,  Stage IIB  w/ Mets to pelvic/ retroperitoneum---  CHEMOTHERAPY CURRENTLY  . 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 Cervical cancer ([place])    Current Facility-Administered Medications  Medication Dose Route Frequency Provider Last Rate Last Dose  . 0.9 %  sodium chloride infusion   Intravenous Continuous Hosie Poisson, MD 100 mL/hr at 03/26/15 0134    . acetaminophen (TYLENOL) tablet 650 mg  650 mg Oral Q6H PRN Hosie Poisson, MD   650 mg at 03/25/15 1322   Or  . acetaminophen (TYLENOL) suppository 650 mg  650 mg Rectal Q6H PRN Hosie Poisson, MD      . albuterol (PROVENTIL) (2.5 MG/3ML) 0.083% nebulizer solution 2.5 mg  2.5 mg Inhalation Q6H PRN Hosie Poisson, MD      . ondansetron (ZOFRAN) tablet 4 mg  4 mg Oral Q6H PRN Hosie Poisson, MD   4 mg at 03/24/15 0512   Or  . ondansetron (ZOFRAN) injection 4 mg  4 mg Intravenous Q6H PRN Hosie Poisson, MD   4 mg at 03/25/15 0723  . pantoprazole (PROTONIX) EC tablet 40 mg  40 mg Oral Daily Hosie Poisson, MD   40 mg at 03/26/15 0946  . piperacillin-tazobactam  (ZOSYN) IVPB 3.375 g  3.375 g Intravenous Q8H Noemi Chapel, MD   3.375 g at 03/26/15 0946  . polyethylene glycol (MIRALAX / GLYCOLAX) packet 17 g  17 g Oral Daily PRN Hosie Poisson, MD      . senna-docusate (Senokot-S) tablet 2 tablet  2 tablet Oral BID Hosie Poisson, MD   2 tablet at 03/25/15 1034  . sodium chloride 0.9 % injection 3 mL  3 mL Intravenous Q12H Hosie Poisson, MD   3 mL at 03/24/15 2036  . traMADol (ULTRAM) tablet 100 mg  100 mg Oral Q6H PRN Hosie Poisson, MD   100 mg at 03/25/15 0973   Facility-Administered Medications Ordered in Other Encounters  Medication Dose Route Frequency Provider Last Rate Last Dose  . dextrose 5 % and 0.9% NaCl 1,000 mL with potassium chloride 20 mEq, magnesium sulfate 12 mEq, mannitol 12.5 g infusion   Intravenous Continuous Lennis Marion Downer, MD   Stopped at 03/01/15 1758   Allergies  Allergen Reactions  . Morphine And Related Rash    Per pt, morphine also makes her vomit  . Codeine Nausea Only  . Hydrocodone Nausea And Vomiting  . Peach [Prunus Persica] Swelling and Rash   Active Problems:   Sepsis ([place])  Cervical cancer (HCC)   Gastritis and gastroduodenitis   Pyometrium  Blood pressure 126/73, pulse 83, temperature 97.9 F (36.6 C), temperature source Oral, resp. rate 18, height 5\' 4"  (1.626 m), weight 158 lb 4.6 oz (71.8 kg), last menstrual period 11/05/2011, SpO2 100 %.  Subjective: Symptoms:  Resolved.   Diet:  Adequate intake.   Activity level: Normal.   Pain:  She reports no pain.    Objective: General Appearance:  Comfortable.   Vital signs: (most recent): Blood pressure 126/73, pulse 83, temperature 97.9 F (36.6 C), temperature source Oral, resp. rate 18, height 5\' 4"  (1.626 m), weight 158 lb 4.6 oz (71.8 kg), last menstrual period 11/05/2011, SpO2 100 %.  Vital signs are normal.   Abdomen: Abdomen is soft.  There is no abdominal tenderness.     Assessment:  Condition: Improving.   (POD #1 s/p D&C for  hematometra/pyometra--source of infection/sepsis  -->continue abx -->will re-check on Monday).    JACKSON-MOORE,Jermany Sundell A 03/26/2015

## 2015-03-26 NOTE — Consult Note (Signed)
Urology Consult  Referring physician: Dr. Karleen Hampshire Reason for referral: urinary incontinence  Chief Complaint: suprapubic pain  History of Present Illness: Stacy Webster is a 54yo with a hx of metastatic cervical cancer admitted with fever, suprapubic pain, and drainage from her vagina. She has a hx of radiation for cervical cancer and subsequently developed a right ureteral stricture which is currently managed with a right ureteral stent. The patient denies any pain from her stent. She denies any difficulty urinating. No urgency, urge incontinence, frequency, dysuria. For several days prior to admission she was having worsening suprapubic pain urinary urgency, and leakage from her vagina thought to be urine. Ct scan obtained during this admission showed a stable right perinephric fluid collection, no hydronephrosis, ureteral stent in good position and a uterus with air and fluid concerning for abscess. She want for US drainage last night and since drainage her suprapubic pain, urinary urgency, and vaginal leakage have resolved.   Past Medical History  Diagnosis Date  . Hydronephrosis, right   . Ureteral stricture, right   . History of septic shock     05/ 2016  due to Peptostriptococcus/  right hydronephrosis and obstruction----  resolved  . 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 Mild intermittent asthma   . History of Clostridium difficile     10-15-2014  resolved  . Left rotator cuff tear arthropathy   . White coat syndrome without hypertension   . GERD (gastroesophageal reflux disease)   . Frequency of urination   . Anemia associated with chemotherapy     Antineoplastic chemo  . Recurrent cervical cancer [facility]) oncologist-  dr Rossi/ dr Marko Plume    dx 12/ 2015---  Radiation and Chemo (07-06-2014 to 09-03-2014)  now recurrent ,  Stage IIB  w/ Mets to pelvic/ retroperitoneum---  CHEMOTHERAPY CURRENTLY  . 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 Cervical cancer [facility])    Past  Surgical History  Procedure Laterality Date  . Cystoscopy w/ ureteral stent placement Right 10/13/2014    Procedure: CYSTOSCOPY WITH RETROGRADE PYELOGRAM/URETERAL STENT PLACEMENT;  Surgeon: Festus Aloe, MD;  Location: [facility];  Service: Urology;  Laterality: Right;  . Transthoracic echocardiogram  10-13-2014    mild LVH,  ef 55-60%/  mild TR  . Tonsillectomy and adenoidectomy  age 47    age 45--  redo-- Adenoidectomy  . Laparoscopic cholecystectomy  1990's  . Cystoscopy with ureteroscopy and stent placement Right 02/18/2015    Procedure: CYSTO WITH RIGHT URETERAL STENT EXCHANGE, RIGHT URETEROSCOPY ;  Surgeon: Festus Aloe, MD;  Location: [facility];  Service: Urology;  Laterality: Right;  . Cystoscopy with biopsy N/A 02/18/2015    Procedure: CYSTOSCOPY WITH  BLADDER  BIOPSY;  Surgeon: Festus Aloe, MD;  Location: [facility];  Service: Urology;  Laterality: N/A;  . Cystoscopy w/ retrogrades Right 02/18/2015    Procedure: CYSTOSCOPY WITH RETROGRADE PYELOGRAM;  Surgeon: Festus Aloe, MD;  Location: [facility];  Service: Urology;  Laterality: Right;  . Dilation and curettage of uterus N/A 03/25/2015    Procedure: DILATATION AND CURETTAGE WITH ULTRASOUND;  Surgeon: Everitt Amber, MD;  Location: [facility];  Service: Gynecology;  Laterality: N/A;    Medications: I have reviewed the patient's current medications. Allergies:  Allergies  Allergen Reactions  . Morphine And Related Rash    Per pt, morphine also makes her vomit  . Codeine Nausea Only  . Hydrocodone Nausea And Vomiting  . Peach [Prunus  Persica] Swelling and Rash    History reviewed. No pertinent family history. Social History:  reports that she has never smoked. She has never used smokeless tobacco. She reports that she does not drink alcohol or use illicit drugs.  Review of Systems  Constitutional: Positive for fever and chills.  Gastrointestinal: Positive for nausea and  abdominal pain.  Genitourinary: Positive for urgency.  Neurological: Positive for weakness.    Physical Exam:  Vital signs in last 24 hours: Temp:  [97.7 F (36.5 C)-97.9 F (36.6 C)] 97.7 F (36.5 C) (10/29 1315) Pulse Rate:  [83-89] 84 (10/29 1315) Resp:  [16-18] 16 (10/29 1315) BP: (118-126)/(65-73) 118/65 mmHg (10/29 1315) SpO2:  [98 %-100 %] 98 % (10/29 1315) Physical Exam  Constitutional: She is oriented to person, place, and time. She appears well-developed and well-nourished.  HENT:  Head: Normocephalic and atraumatic.  Eyes: EOM are normal. Pupils are equal, round, and reactive to light.  Neck: Normal range of motion. No thyromegaly present.  Cardiovascular: Normal rate and regular rhythm.   Respiratory: Effort normal and breath sounds normal.  GI: Soft. She exhibits no distension. There is no tenderness.  Musculoskeletal: Normal range of motion.  Neurological: She is alert and oriented to person, place, and time.  Skin: Skin is warm and dry.  Psychiatric: She has a normal mood and affect. Her behavior is normal. Judgment and thought content normal.    Laboratory Data:  Results for orders placed or performed during the hospital encounter of 03/23/15 (from the past 72 hour(s))  CBC     Status: Abnormal   Collection Time: 03/24/15  4:57 AM  Result Value Ref Range   WBC 13.8 (H) 4.0 - 10.5 K/uL   RBC 2.72 (L) 3.87 - 5.11 MIL/uL   Hemoglobin 9.0 (L) 12.0 - 15.0 g/dL   HCT 28.2 (L) 36.0 - 46.0 %   MCV 103.7 (H) 78.0 - 100.0 fL   MCH 33.1 26.0 - 34.0 pg   MCHC 31.9 30.0 - 36.0 g/dL   RDW 15.4 11.5 - 15.5 %   Platelets 301 150 - 400 K/uL  Basic metabolic panel     Status: Abnormal   Collection Time: 03/24/15  4:57 AM  Result Value Ref Range   Sodium 134 (L) 135 - 145 mmol/L   Potassium 3.6 3.5 - 5.1 mmol/L   Chloride 102 101 - 111 mmol/L   CO2 25 22 - 32 mmol/L   Glucose, Bld 102 (H) 65 - 99 mg/dL   BUN 11 6 - 20 mg/dL   Creatinine, Ser 1.10 (H) 0.44 - 1.00  mg/dL   Calcium 7.9 (L) 8.9 - 10.3 mg/dL   GFR calc non Af Amer 56 (L) >60 mL/min   GFR calc Af Amer >60 >60 mL/min    Comment: (NOTE) The eGFR has been calculated using the CKD EPI equation. This calculation has not been validated in all clinical situations. eGFR's persistently <60 mL/min signify possible Chronic Kidney Disease.    Anion gap 7 5 - 15  CBC with Differential/Platelet     Status: Abnormal   Collection Time: 03/25/15  9:26 AM  Result Value Ref Range   WBC 18.1 (H) 4.0 - 10.5 K/uL   RBC 2.74 (L) 3.87 - 5.11 MIL/uL   Hemoglobin 9.4 (L) 12.0 - 15.0 g/dL   HCT 28.4 (L) 36.0 - 46.0 %   MCV 103.6 (H) 78.0 - 100.0 fL   MCH 34.3 (H) 26.0 - 34.0 pg   MCHC 33.1  30.0 - 36.0 g/dL   RDW 15.4 11.5 - 15.5 %   Platelets 272 150 - 400 K/uL   Neutrophils Relative % 86 %   Neutro Abs 15.4 (H) 1.7 - 7.7 K/uL   Lymphocytes Relative 6 %   Lymphs Abs 1.2 0.7 - 4.0 K/uL   Monocytes Relative 8 %   Monocytes Absolute 1.5 (H) 0.1 - 1.0 K/uL   Eosinophils Relative 0 %   Eosinophils Absolute 0.0 0.0 - 0.7 K/uL   Basophils Relative 0 %   Basophils Absolute 0.0 0.0 - 0.1 K/uL  Comprehensive metabolic panel     Status: Abnormal   Collection Time: 03/25/15  9:26 AM  Result Value Ref Range   Sodium 135 135 - 145 mmol/L   Potassium 3.2 (L) 3.5 - 5.1 mmol/L   Chloride 102 101 - 111 mmol/L   CO2 25 22 - 32 mmol/L   Glucose, Bld 96 65 - 99 mg/dL   BUN 9 6 - 20 mg/dL   Creatinine, Ser 1.10 (H) 0.44 - 1.00 mg/dL   Calcium 8.3 (L) 8.9 - 10.3 mg/dL   Total Protein 6.1 (L) 6.5 - 8.1 g/dL   Albumin 2.5 (L) 3.5 - 5.0 g/dL   AST 13 (L) 15 - 41 U/L   ALT 10 (L) 14 - 54 U/L   Alkaline Phosphatase 67 38 - 126 U/L   Total Bilirubin 0.4 0.3 - 1.2 mg/dL   GFR calc non Af Amer 56 (L) >60 mL/min   GFR calc Af Amer >60 >60 mL/min    Comment: (NOTE) The eGFR has been calculated using the CKD EPI equation. This calculation has not been validated in all clinical situations. eGFR's persistently <60 mL/min  signify possible Chronic Kidney Disease.    Anion gap 8 5 - 15  Anaerobic culture     Status: None (Preliminary result)   Collection Time: 03/25/15  3:50 PM  Result Value Ref Range   Specimen Description WOUND UTERINE FLUID    Special Requests PATIENT ON FOLLOWING ZOSYN AND VANCOMYCIN    Gram Stain      NO WBC SEEN NO SQUAMOUS EPITHELIAL CELLS SEEN NO ORGANISMS SEEN Performed at [facility]    Culture PENDING    Report Status PENDING   Wound culture     Status: None (Preliminary result)   Collection Time: 03/25/15  3:50 PM  Result Value Ref Range   Specimen Description WOUND ENDOMETRIAL/UTERINE FLUID    Special Requests PATIENT ON FOLLOWING ZOSYN AND VANCOMYCIN    Gram Stain      NO WBC SEEN NO SQUAMOUS EPITHELIAL CELLS SEEN NO ORGANISMS SEEN Performed at [facility]    Culture PENDING    Report Status PENDING   CBC     Status: Abnormal   Collection Time: 03/26/15  5:58 AM  Result Value Ref Range   WBC 13.4 (H) 4.0 - 10.5 K/uL   RBC 2.57 (L) 3.87 - 5.11 MIL/uL   Hemoglobin 8.3 (L) 12.0 - 15.0 g/dL   HCT 26.1 (L) 36.0 - 46.0 %   MCV 101.6 (H) 78.0 - 100.0 fL   MCH 32.3 26.0 - 34.0 pg   MCHC 31.8 30.0 - 36.0 g/dL   RDW 15.2 11.5 - 15.5 %   Platelets 295 150 - 400 K/uL  Basic metabolic panel     Status: Abnormal   Collection Time: 03/26/15  5:58 AM  Result Value Ref Range   Sodium 139 135 - 145 mmol/L  Potassium 4.3 3.5 - 5.1 mmol/L    Comment: RESULT REPEATED AND VERIFIED DELTA CHECK NOTED    Chloride 107 101 - 111 mmol/L   CO2 26 22 - 32 mmol/L   Glucose, Bld 181 (H) 65 - 99 mg/dL   BUN 14 6 - 20 mg/dL   Creatinine, Ser 1.00 0.44 - 1.00 mg/dL   Calcium 8.2 (L) 8.9 - 10.3 mg/dL   GFR calc non Af Amer >60 >60 mL/min   GFR calc Af Amer >60 >60 mL/min    Comment: (NOTE) The eGFR has been calculated using the CKD EPI equation. This calculation has not been validated in all clinical situations. eGFR's persistently <60 mL/min signify possible  Chronic Kidney Disease.    Anion gap 6 5 - 15   Recent Results (from the past 240 hour(s))  Urine culture     Status: None   Collection Time: 03/21/15  4:45 PM  Result Value Ref Range Status   Specimen Description URINE, RANDOM  Final   Special Requests PATIENT ON FOLLOWING CIPROFLOXACIN  Final   Culture   Final    NO GROWTH 2 DAYS Performed at [facility]    Report Status 03/23/2015 FINAL  Final  Blood Culture (routine x 2)     Status: None (Preliminary result)   Collection Time: 03/23/15 10:55 AM  Result Value Ref Range Status   Specimen Description BLOOD LEFT ANTECUBITAL  Final   Special Requests BOTTLES DRAWN AEROBIC AND ANAEROBIC 5CC  Final   Culture   Final    NO GROWTH 3 DAYS Performed at [facility]    Report Status PENDING  Incomplete  Blood Culture (routine x 2)     Status: None (Preliminary result)   Collection Time: 03/23/15 11:00 AM  Result Value Ref Range Status   Specimen Description BLOOD RIGHT ARM  Final   Special Requests BOTTLES DRAWN AEROBIC AND ANAEROBIC 5ML  Final   Culture   Final    NO GROWTH 3 DAYS Performed at [facility]    Report Status PENDING  Incomplete  Urine culture     Status: None   Collection Time: 03/23/15 11:03 AM  Result Value Ref Range Status   Specimen Description URINE, CATHETERIZED  Final   Special Requests NONE  Final   Culture   Final    NO GROWTH 1 DAY Performed at [facility]    Report Status 03/24/2015 FINAL  Final  Wet prep, genital     Status: Abnormal   Collection Time: 03/23/15  3:14 PM  Result Value Ref Range Status   Yeast Wet Prep HPF POC NONE SEEN NONE SEEN Final   Trich, Wet Prep NONE SEEN NONE SEEN Final   Clue Cells Wet Prep HPF POC NONE SEEN NONE SEEN Final   WBC, Wet Prep HPF POC FEW (A) NONE SEEN Final  Anaerobic culture     Status: None (Preliminary result)   Collection Time: 03/25/15  3:50 PM  Result Value Ref Range Status   Specimen Description WOUND UTERINE FLUID   Final   Special Requests PATIENT ON FOLLOWING ZOSYN AND VANCOMYCIN  Final   Gram Stain   Final    NO WBC SEEN NO SQUAMOUS EPITHELIAL CELLS SEEN NO ORGANISMS SEEN Performed at [facility]    Culture PENDING  Incomplete   Report Status PENDING  Incomplete  Wound culture     Status: None (Preliminary result)   Collection Time: 03/25/15  3:50 PM  Result Value Ref Range Status   Specimen Description WOUND ENDOMETRIAL/UTERINE FLUID  Final   Special Requests PATIENT ON FOLLOWING ZOSYN AND VANCOMYCIN  Final   Gram Stain   Final    NO WBC SEEN NO SQUAMOUS EPITHELIAL CELLS SEEN NO ORGANISMS SEEN Performed at [facility]    Culture PENDING  Incomplete   Report Status PENDING  Incomplete   Creatinine:  Recent Labs  03/21/15 1534 03/23/15 1104 03/24/15 0457 03/25/15 0926 03/26/15 0558  CREATININE 1.1 1.11* 1.10* 1.10* 1.00   Baseline Creatinine: 1  Impression/Assessment:  54yo with metastatic cervical CA, right hydronephrosis s/p ureteral stent, and urinary urgency which has resolved  Plan:  1. Urgency and vaginal leakage related to uterine fluid collection that has since been drained. Patient is currently asymptomatic from a GU standpoint. She should followup with [name] in 2-3 months for right ureteral stent exchange 2. Continue management per primary team  Mireyah Chervenak L 03/26/2015, 5:15 PM

## 2015-03-26 NOTE — Progress Notes (Signed)
Entered patients room to administer 1800 Zosyn dose. Pt stated "she felt she looked a little flushed." No complaints of itching or pain. Paged Dr. Karleen Hampshire and received order to administer 25 PO of Benadryl. Will completed orders and continue to monitor.

## 2015-03-27 ENCOUNTER — Other Ambulatory Visit: Payer: Self-pay | Admitting: Oncology

## 2015-03-27 DIAGNOSIS — D509 Iron deficiency anemia, unspecified: Secondary | ICD-10-CM

## 2015-03-27 DIAGNOSIS — G620 Drug-induced polyneuropathy: Secondary | ICD-10-CM

## 2015-03-27 MED ORDER — FERROUS FUMARATE 325 (106 FE) MG PO TABS
1.0000 | ORAL_TABLET | Freq: Every day | ORAL | Status: DC
  Administered 2015-03-27 – 2015-03-28 (×2): 106 mg via ORAL
  Filled 2015-03-27 (×2): qty 1

## 2015-03-27 NOTE — Progress Notes (Signed)
[facility] PROGRESS NOTE  REOLA BUCKLES IWL:[id number] DOB: March 12, 1961 DOA: 03/23/2015 PCP: [name], MD  Assessment/Plan: Sepsis from urinary source; she was febrile, hypotensive and tachycardic on arrival, with abnormal UA and elevated lactic acid.  Admitted to telemetry.  Received 2.5 lit fluid boluses and her lactic acid normalized.  Started her on IV zosyn and got urine and blood cultures which have been negative so far.  Resume zosyn. Improving.  Persistent spiking of fevers and gyn onc consulted.  Patient underwent uncomplicated D&C and suction evacuation of a small amount of pyometrium. Culture and biopsies are sent .  Her fever has subsided, and leukocytosis is improving.  So far her culture report have been negative.     Right ureteral stent exchanged on 9/23rd, and pt reports bladder in continence since then. Further management as per urology as outpatient.    Metastatic adenocarcinoma of cervix: Further management as per Dr Marko Plume.    Dehydration: Fluid resuscitation. Improved.   Mild hyponatremia: probably from dehydration.  Resolved with hydration.    Leukocytosis: From the sepsis. improved.  Monitor.    Anemia: macrocytic.  Probably from the malignancy.   Code Status: full code.  Family Communication: family at bedside.  Disposition Plan: possible d/c home on Monday.   Consultants: Oncology.  Gyn oncology. Procedures:  none  Antibiotics:  zosyn  HPI/Subjective: No nausea, vomiting or abdominal pains recommend to ambulate.  Objective: Filed Vitals:   03/27/15 1310  BP: 148/79  Pulse: 76  Temp: 98.1 F (36.7 C)  Resp: 16    Intake/Output Summary (Last 24 hours) at 03/27/15 1521 Last data filed at 03/27/15 0600  Gross per 24 hour  Intake   1600 ml  Output      0 ml  Net   1600 ml   Filed Weights   03/23/15 1032 03/23/15 1053 03/23/15 1607  Weight: 68.493 kg (151 lb) 68.493 kg (151 lb) 71.8 kg (158 lb 4.6  oz)    Exam:   General:  Alert afebrile comfortable.   Cardiovascular: s1s2  Respiratory: ctab  Abdomen: soft mildly tender ND bs+  Musculoskeletal: no pedal edema.   Data Reviewed: Basic Metabolic Panel:  Recent Labs Lab 03/21/15 1534 03/23/15 1104 03/24/15 0457 03/25/15 0926 03/26/15 0558  NA 136 133* 134* 135 139  K 4.3 3.5 3.6 3.2* 4.3  CL  --  97* 102 102 107  CO2 29 26 25 25 26   GLUCOSE 118 115* 102* 96 181*  BUN 16.0 14 11 9 14   CREATININE 1.1 1.11* 1.10* 1.10* 1.00  CALCIUM 9.6 9.2 7.9* 8.3* 8.2*   Liver Function Tests:  Recent Labs Lab 03/21/15 1534 03/23/15 1104 03/25/15 0926  AST 10 19 13*  ALT <9 12* 10*  ALKPHOS 70 69 67  BILITOT <0.30 0.9 0.4  PROT 6.8 7.3 6.1*  ALBUMIN 2.8* 3.3* 2.5*   No results for input(s): LIPASE, AMYLASE in the last 168 hours. No results for input(s): AMMONIA in the last 168 hours. CBC:  Recent Labs Lab 03/21/15 1534 03/22/15 1342 03/23/15 1104 03/24/15 0457 03/25/15 0926 03/26/15 0558  WBC 11.8* 12.1* 16.1* 13.8* 18.1* 13.4*  NEUTROABS 9.6* 9.8* 13.3*  --  15.4*  --   HGB 10.9* 10.5* 10.7* 9.0* 9.4* 8.3*  HCT 33.0* 31.8* 33.4* 28.2* 28.4* 26.1*  MCV 101.5* 100.4 104.0* 103.7* 103.6* 101.6*  PLT 368 390 398 301 272 295   Cardiac Enzymes: No results for input(s): CKTOTAL, CKMB, CKMBINDEX, TROPONINI in the last 168 hours.  BNP (last 3 results) No results for input(s): BNP in the last 8760 hours.  ProBNP (last 3 results) No results for input(s): PROBNP in the last 8760 hours.  CBG: No results for input(s): GLUCAP in the last 168 hours.  Recent Results (from the past 240 hour(s))  Urine culture     Status: None   Collection Time: 03/21/15  4:45 PM  Result Value Ref Range Status   Specimen Description URINE, RANDOM  Final   Special Requests PATIENT ON FOLLOWING CIPROFLOXACIN  Final   Culture   Final    NO GROWTH 2 DAYS Performed at [facility]    Report Status 03/23/2015 FINAL  Final   Blood Culture (routine x 2)     Status: None (Preliminary result)   Collection Time: 03/23/15 10:55 AM  Result Value Ref Range Status   Specimen Description BLOOD LEFT ANTECUBITAL  Final   Special Requests BOTTLES DRAWN AEROBIC AND ANAEROBIC 5CC  Final   Culture   Final    NO GROWTH 4 DAYS Performed at [facility]    Report Status PENDING  Incomplete  Blood Culture (routine x 2)     Status: None (Preliminary result)   Collection Time: 03/23/15 11:00 AM  Result Value Ref Range Status   Specimen Description BLOOD RIGHT ARM  Final   Special Requests BOTTLES DRAWN AEROBIC AND ANAEROBIC 5ML  Final   Culture   Final    NO GROWTH 4 DAYS Performed at [facility]    Report Status PENDING  Incomplete  Urine culture     Status: None   Collection Time: 03/23/15 11:03 AM  Result Value Ref Range Status   Specimen Description URINE, CATHETERIZED  Final   Special Requests NONE  Final   Culture   Final    NO GROWTH 1 DAY Performed at [facility]    Report Status 03/24/2015 FINAL  Final  Wet prep, genital     Status: Abnormal   Collection Time: 03/23/15  3:14 PM  Result Value Ref Range Status   Yeast Wet Prep HPF POC NONE SEEN NONE SEEN Final   Trich, Wet Prep NONE SEEN NONE SEEN Final   Clue Cells Wet Prep HPF POC NONE SEEN NONE SEEN Final   WBC, Wet Prep HPF POC FEW (A) NONE SEEN Final  Anaerobic culture     Status: None (Preliminary result)   Collection Time: 03/25/15  3:50 PM  Result Value Ref Range Status   Specimen Description WOUND UTERINE FLUID  Final   Special Requests PATIENT ON FOLLOWING ZOSYN AND VANCOMYCIN  Final   Gram Stain   Final    NO WBC SEEN NO SQUAMOUS EPITHELIAL CELLS SEEN NO ORGANISMS SEEN Performed at [facility]    Culture PENDING  Incomplete   Report Status PENDING  Incomplete  Wound culture     Status: None (Preliminary result)   Collection Time: 03/25/15  3:50 PM  Result Value Ref Range Status   Specimen Description  WOUND ENDOMETRIAL/UTERINE FLUID  Final   Special Requests PATIENT ON FOLLOWING ZOSYN AND VANCOMYCIN  Final   Gram Stain   Final    NO WBC SEEN NO SQUAMOUS EPITHELIAL CELLS SEEN NO ORGANISMS SEEN Performed at [facility]    Culture   Final    NO GROWTH 1 DAY Performed at [facility]    Report Status PENDING  Incomplete     Studies: [place] Intraoperative  03/25/2015  CLINICAL DATA:  Ultrasound was provided for use by the ordering physician, and a technical charge was applied by the performing facility.  No radiologist interpretation/professional services rendered.    Scheduled Meds: . ferrous fumarate  1 tablet Oral Daily  . pantoprazole  40 mg Oral Daily  . piperacillin-tazobactam (ZOSYN)  IV  3.375 g Intravenous Q8H  . senna-docusate  2 tablet Oral BID  . sodium chloride  3 mL Intravenous Q12H   Continuous Infusions: . sodium chloride 100 mL/hr at 03/26/15 2154    Active Problems:   Sepsis (New Site)   Cervical cancer (HCC)   Gastritis and gastroduodenitis   Pyometrium    Time spent: 25 minutes.     [facility] Pager [phone] If 7PM-7AM, please contact night-coverage at [email or web address], password [facility] 03/27/2015, 3:21 PM  LOS: 3 days

## 2015-03-27 NOTE — Progress Notes (Signed)
MEDICAL ONCOLOGY March 27, 2015, 9:45 AM  Hospital day 5 Antibiotics: zosyn (has NOT been on vanc) Chemotherapy: cisplatinum, taxol, avastin last 03-01-15; held 10-26 and 10-27 with infection symptoms  Physicians:Emma Carita Pian, MD, Michel Bickers, M. Eskridge, Genia Del), Marguerita Merles, Janyth Pupa  EMR reviewed, patient seen with niece here now.   Subjective: Has felt much better since US guided D&C by Dr [name] on 03-25-15, tho since last PM she has again had slight discomfort in low pelvis bilaterally and new slight vaginal bleeding. She has had no fever since the procedure, no shaking chills. She has been able to eat and is drinking fluids, no nausea now. Bowels have moved. Feet had swelling when up to chair yesterday, encouraged her to sit up with legs elevated in the recliner. No other bleeding.    No central line Right ureteral stent Has not had flu vaccine yet: please give at hospital DC if ok for flu vaccine by then  [place] Patient had been in usual good health, with no previous abnormal PAP smears, when she developed frequent urination and vaginal bleeding in late 2015. She had endometrial biopsy by Dr Janyth Pupa [phone]) with adenocarcinoma. PET in [facility] system 5-39-76 had hypermetabolic uptake within the uterine cervix and lower uterine segment and metastatic adenopathy within the bilateral pelvis and periaortic retroperitoneum. Dr Genia Del and Dr Marguerita Merles treated with pelvic and para-aortic radiation given with 6 cycles of sensitizing CDDP, and HDR x5 from 07-06-14 thru 09-03-14. Patient had a difficult time with nausea, vomiting, diarrhea and hypokalemia/ hypomagnesemia thru treatment. Patient used ODT zofran and ativan during chemotherapy. She had a difficult time doing oral prehydration for CDDP. She had no peripheral neuropathy with the CDDP. There was concern by completion of the treatment that she did not have optimal  response, with plan for repeat evaluation at 6 weeks from completion of the treatment. Prior to planned reevaluation, patient began having daily fevers for possibly 4 weeks, as high as 102 degrees x 2 weeks prior to 10-11-14; by presentation at ED she also had increased right flank pain. She had CT [facility] on 10-08-14, this compared with PET of 05-2014 showed increase in subcapsular fluid collection right kidney and right hydronephrosis, increase in size of endometrial canal/ uterus/ right adnexa, and improvement in retroperitoneal, common iliac and bilateral pelvic sidewall adenopathy. She was admitted to [facility] ICU 5-16/ 10-12-14 with sepsis from peptostreptococcus. She had percutaneous drainage by IR of 635 cc dark thin fluid from right perinephric area on 10-12-14, with perinephric drain removed by urology on 10-18-14. She had right ureteral stent placement by Dr Junious Silk on 10-13-14. She developed C.difficile diarrhea, begun on flagyl 10-15-14 x 2 weeks, also on ceftin during that time. She saw Dr [name] in consultation on 11-05-14, with cervix grossly normal appearance, firm to palpation with right anterior extension, and purulent material from cervical os with manipulation, no palpable adnexal masses, induration R>L parametrium. PET 11-28-39 had hypermetabolic tissue thickening at cecum, + small right sided mesenteric and inguinal nodes, foci in myometriium and in region of cervix, small right perinephric fluid collection and dilated fluid filled endometrium. Dr [name] saw her following the PET, with recommendation for systemic treatment of the metastatic disease with CDDP, taxol and avastin. First CDDP Taxol 11-19-14, neutropenic day 14. Repeat CT showed essentially stable, minimal right perinephric fluid on 12-06-14, prior to cycle 2 CDDP taxol 7-14 with first avastin 12-10-14 and neulasta. CT AP 02-21-15 after 4  cycles CDDP taxol/ 3 cycles avastin showed improvement, plan to continue x  another 3 cycles and reevaluate. Most recent chemotherapy was CDDP taxol avastin 03-01-15. She had cystoscopy, bladder biopsy with fulguration, right diagnostic ureteroscopy,right retrograde pyelogram, and right ureteral stent exchange by Dr Junious Silk on 02-18-15. 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 [facility], with discomfort thought bladder or low pelvis, fevers to 100, poor po intake with some nausea. Urine culture was sent 10-24 (on cipro), no growth so far. She was given IVF and had blood culture done at [facility] on 10-25 (no growth so far, no shaking chills at time of that blood culture). Temperatures were as high as 102 by 03-23-15 with shaking chills and she was admitted 03-23-15. NOTE past urosepsis and past C diff.   Objective: Vital signs in last 24 hours: Blood pressure 141/70, pulse 75, temperature 98.1 F (36.7 C), temperature source Oral, resp. rate 18, height 5\' 4"  (1.626 m), weight 158 lb 4.6 oz (71.8 kg), last menstrual period 11/05/2011, SpO2 100 %.   Intake/Output from previous day: 10/29 0701 - 10/30 0700 In: 2690 [[place].O.:240; I.V.:2300; IV Piggyback:150] Out: -  Intake/Output this shift:    Physical exam: awake, alert, looks more comfortable. Peripheral IV RUE, still infusing at 100, site ok. Alopecia. PERRL, not icteric. Lungs clear anteriorly and laterally. Abdomen soft, not tender, bowel sounds present. SCDs on, feet warm without pedal edema. Moves all extremities easily.   Lab Results:  Recent Labs  03/25/15 0926 03/26/15 0558  WBC 18.1* 13.4*  HGB 9.4* 8.3*  HCT 28.4* 26.1*  PLT 272 295   BMET  Recent Labs  03/25/15 0926 03/26/15 0558  NA 135 139  K 3.2* 4.3  CL 102 107  CO2 25 26  GLUCOSE 96 181*  BUN 9 14  CREATININE 1.10*  1.00  CALCIUM 8.3* 8.2*   Initial urine and blood cultures negative (had been on cipro x 2-3 days at time of initial cultures) Other blood and tissue fluid cultures no growth to date  Studies/Results: [place] Intraoperative  03/25/2015  CLINICAL DATA:  Ultrasound was provided for use by the ordering physician, and a technical charge was applied by the performing facility.  No radiologist interpretation/professional services rendered.     Assessment/Plan: 1. Fever, chills, lower abdominal/ pelvic discomfort not improved with empiric cipro and other outpatient interventions. Presumed urinary source initially, tho cultures on cipro no growth. Admitted with fever to 102 and shaking chills, no improvement on zosyn until D&C drainage of pyometrium on 03-25-15. Continuing zosyn, cultures in process. Some slight increase in low pelvic discomfort since last pm may need further attention from gyn onc tomorrow. 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 Improvement with CDDP taxol avastin by CT 02-21-15, with 3 additional cycles planned,last treatment 03-01-15 and will need to delay an additional week, information sent to [facility] scheduling and should be addressed on Monday. WBC/ ANC not low 3. anemia is iron deficiency from blood loss and chemo anemia. Resume oral iron, which she had been taking at home. Likely can decrease IVF rate now. 4.No central catheter at patient's preference 5.previous hypoK and hypoMg related to CDDP 6.intolerance morphine and codeine. 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

## 2015-03-28 ENCOUNTER — Telehealth: Payer: Self-pay | Admitting: Oncology

## 2015-03-28 LAB — BASIC METABOLIC PANEL
Anion gap: 8 (ref 5–15)
BUN: 11 mg/dL (ref 6–20)
CALCIUM: 8.3 mg/dL — AB (ref 8.9–10.3)
CO2: 27 mmol/L (ref 22–32)
CREATININE: 1.12 mg/dL — AB (ref 0.44–1.00)
Chloride: 105 mmol/L (ref 101–111)
GFR calc Af Amer: >60 mL/min (ref >60)
GFR calc non Af Amer: 55 mL/min — ABNORMAL LOW (ref >60)
GLUCOSE: 91 mg/dL (ref 65–99)
Potassium: 3.6 mmol/L (ref 3.5–5.1)
Sodium: 140 mmol/L (ref 135–145)

## 2015-03-28 LAB — WOUND CULTURE
Culture: NO GROWTH
GRAM STAIN: NONE SEEN

## 2015-03-28 LAB — CULTURE, BLOOD (ROUTINE X 2)
CULTURE: NO GROWTH
CULTURE: NO GROWTH

## 2015-03-28 LAB — CBC
HCT: 26.6 % — ABNORMAL LOW (ref 36.0–46.0)
Hemoglobin: 8.5 g/dL — ABNORMAL LOW (ref 12.0–15.0)
MCH: 32.7 pg (ref 26.0–34.0)
MCHC: 32 g/dL (ref 30.0–36.0)
MCV: 102.3 fL — AB (ref 78.0–100.0)
PLATELETS: 332 10*3/uL (ref 150–400)
RBC: 2.6 MIL/uL — ABNORMAL LOW (ref 3.87–5.11)
RDW: 15.6 % — AB (ref 11.5–15.5)
WBC: 8.7 10*3/uL (ref 4.0–10.5)

## 2015-03-28 LAB — CULTURE, BLOOD (SINGLE)

## 2015-03-28 MED ORDER — AMOXICILLIN-POT CLAVULANATE 875-125 MG PO TABS: 1.0000 | ORAL_TABLET | Freq: Two times a day (BID) | ORAL | Status: DC

## 2015-03-28 MED ORDER — TRAMADOL HCL 50 MG PO TABS: 100.0000 mg | ORAL_TABLET | Freq: Four times a day (QID) | ORAL | Status: DC | PRN

## 2015-03-28 NOTE — Telephone Encounter (Signed)
Appointments moved per 10/30 pof as patient is in the hospital and she will get a new scheduel at d/c from hospital   anne

## 2015-03-28 NOTE — Discharge Summary (Signed)
Physician Discharge Summary  Stacy Webster DTO:[id number] DOB: 11-29-1960 DOA: 03/23/2015  PCP: [name], MD  Admit date: 03/23/2015 Discharge date: 03/28/2015  Time spent: 30 minutes  Recommendations for Outpatient Follow-up:  1. Follow up with CBC and BMP in a week for a follow up to check hemoglobin and creatinine and electrolytes.  2. Follow up with oncology and gyn onc as recommended.   Discharge Diagnoses:  Active Problems:   Sepsis ([place])   Cervical cancer ([place])   Gastritis and gastroduodenitis   Pyometrium   Discharge Condition: improved.   Diet recommendation:regular.   Filed Weights   03/23/15 1032 03/23/15 1053 03/23/15 1607  Weight: 68.493 kg (151 lb) 68.493 kg (151 lb) 71.8 kg (158 lb 4.6 oz)    History of present illness:  54 year old lady with h/o metastatic cervical cancer admitted for sepsis.   Hospital Course:  Sepsis from urinary source; she was febrile, hypotensive and tachycardic on arrival, with abnormal UA and elevated lactic acid.  Admitted to telemetry.  Received 2.5 lit fluid boluses and her lactic acid normalized.  Started her on IV zosyn and got urine and blood cultures which have been negative so far.  Persistent spiking of fevers and gyn onc consulted. Patient underwent uncomplicated D&C and suction evacuation of a small amount of pyometrium. Culture and biopsies are sent .  Her fever has subsided, and leukocytosis is improving.  So far her culture report have been negative.  She was discharged on augmentin to complete the course.     Right ureteral stent exchanged on 9/23rd, and pt reports bladder in continence since then. Further management as per urology as outpatient.    Metastatic adenocarcinoma of cervix: Further management as per Dr Marko Plume.    Dehydration: Fluid resuscitation. Improved.   Mild hyponatremia: probably from dehydration.  Resolved with hydration.    Leukocytosis: From the sepsis. improved.   Monitor.    Anemia: macrocytic.  Probably from the malignancy.   Procedures:  D&C  Consultations:  Oncology  Gyn oncology    Discharge Exam: Filed Vitals:   03/28/15 1019  BP: 162/80  Pulse:   Temp:   Resp:     General: alet afebrile comfortable.  Cardiovascular: s1s2 Respiratory: ctab  Discharge Instructions   Discharge Instructions    Diet general    Complete by:  As directed      Discharge instructions    Complete by:  As directed   Follow up with oncology and gyn onc as recommended.          Current Discharge Medication List    START taking these medications   Details  amoxicillin-clavulanate (AUGMENTIN) 875-125 MG tablet Take 1 tablet by mouth 2 (two) times daily. Qty: 10 tablet, Refills: 0    traMADol (ULTRAM) 50 MG tablet Take 2 tablets (100 mg total) by mouth every 6 (six) hours as needed for moderate pain. Qty: 20 tablet, Refills: 0      CONTINUE these medications which have NOT CHANGED   Details  acetaminophen (TYLENOL) 500 MG tablet Take 1,000 mg by mouth 2 (two) times daily as needed for moderate pain, fever or headache.    albuterol (PROVENTIL HFA;VENTOLIN HFA) 108 (90 BASE) MCG/ACT inhaler Inhale 2 puffs into the lungs every 6 (six) hours as needed for wheezing or shortness of breath.    dexamethasone (DECADRON) 4 MG tablet Take 5 tablets with food 12 hrs and 6 hrs prior to Taxol Chemotherapy Qty: 20 tablet, Refills: 0  Associated Diagnoses: Malignant neoplasm of cervix, unspecified site ([place])    diphenoxylate-atropine (LOMOTIL) 2.5-0.025 MG per tablet Take 1 tablet by mouth as needed for diarrhea or loose stools. Can take up to 8 times daily Refills: 0    docusate sodium (COLACE) 100 MG capsule Take 100 mg by mouth daily as needed for mild constipation.     HEMOCYTE 324 (106 FE) MG TABS Take 325 mg by mouth daily. Take 1 tablet daily on empty stomach with Vitamin C tablet Qty: 30 tablet, Refills: 3   Associated Diagnoses:  Cervical cancer ([facility])    lactose free nutrition (BOOST PLUS) LIQD Take 237 mLs by mouth 3 (three) times daily with meals. Qty: 60 Can, Refills: 0    loratadine (CLARITIN) 10 MG tablet Take 10 mg by mouth daily as needed for allergies.     LORazepam (ATIVAN) 1 MG tablet Place 1/2 - 1 tablet under the tongue or swallow evry 4-6 hrs as needed for nausea Qty: 30 tablet, Refills: 0   Associated Diagnoses: Malignant neoplasm of cervix, unspecified site ([facility])    Magnesium Oxide 500 MG CAPS Take 500 mg by mouth daily.    pantoprazole (PROTONIX) 40 MG tablet Take 1 tablet (40 mg total) by mouth daily. Qty: 30 tablet, Refills: 2   Associated Diagnoses: Gastroesophageal reflux disease, esophagitis presence not specified    phenazopyridine (PYRIDIUM) 200 MG tablet take 1 tablet by mouth three times a day FOR BURNING Refills: 0    potassium chloride (K-DUR,KLOR-CON) 10 MEQ tablet Take 10 mEq by mouth daily.    vitamin C (ASCORBIC ACID) 500 MG tablet Take 500 mg by mouth daily.    ondansetron (ZOFRAN-ODT) 8 MG disintegrating tablet Take 1 tablet (8 mg total) by mouth every 8 (eight) hours as needed for nausea or vomiting. Qty: 30 tablet, Refills: 2   Associated Diagnoses: Malignant neoplasm of cervix, unspecified site ([place])      STOP taking these medications     ciprofloxacin (CIPRO) 500 MG tablet      ibuprofen (ADVIL,MOTRIN) 200 MG tablet        Allergies  Allergen Reactions  . Morphine And Related Rash    Per pt, morphine also makes her vomit  . Codeine Nausea Only  . Hydrocodone Nausea And Vomiting  . Peach [Prunus Persica] Swelling and Rash      The results of significant diagnostics from this hospitalization (including imaging, microbiology, ancillary and laboratory) are listed below for reference.    Significant Diagnostic Studies: [place] Intraoperative  03/25/2015  CLINICAL DATA:  Ultrasound was provided for use by the ordering physician, and a technical charge was applied  by the performing facility.  No radiologist interpretation/professional services rendered.   Ct Abdomen Pelvis W Contrast  03/23/2015  CLINICAL DATA:  54 year old female with pelvic pain and fever for 6 days. Cervical cancer chemotherapy in progress. Subsequent encounter. EXAM: CT ABDOMEN AND PELVIS WITH CONTRAST TECHNIQUE: Multidetector CT imaging of the abdomen and pelvis was performed using the standard protocol following bolus administration of intravenous contrast. CONTRAST:  146mL OMNIPAQUE IOHEXOL 300 MG/ML  SOLN COMPARISON:  CT Abdomen and Pelvis 02/21/2015 and earlier. FINDINGS: Negative lung bases.  No pericardial or pleural effusion. Stable visualized osseous structures. Stable presacral stranding or trace fluid. Rectum remains within normal limits. Stable post treatment appearance of the uterus, cervix and vagina compared to September. Stable all appearance of the at adnexa and right gonadal vein. Sigmoid colon remains within normal limits. Retained stool throughout the low  left colon and transverse colon. Oral contrast has reached the right colon. There is continued circumferential wall thickening in the distal ileum looped in the right lower quadrant located adjacent to the uterus and right adnexa (series 2, image 64). The terminal ileum itself appears relatively spared. No dilated small bowel. Decompressed stomach and duodenum. No abdominal free fluid or free air. Surgically absent gallbladder. Liver, spleen, pancreas, and adrenal glands remain within normal limits. Portal venous system is patent. Major arterial structures in the abdomen and pelvis remain patent. Previously identified right lower quadrant mesenteric lymph nodes are stable to mildly regressed (e.g. Series 2, image 44 today versus same series and image previously). No new or increased lymphadenopathy. Both kidneys are stable since September. Mild prominence of the left renal collecting system but preserved delayed contrast  excretion. Right double-J ureteral stent remains in place. No gas within the bladder today. Right renal subcapsular fluid collection is unchanged. Right renal enhancement and contrast excretion is stable. Stable all ventral lower abdominal incisional scar. Stable all inguinal lymph nodes. IMPRESSION: No finding identified to explain new fever. Stable CT abdomen and pelvis since the restaging study on 02/21/2015 (please see that report). Electronically Signed   By: Genevie Ann M.D.   On: 03/23/2015 14:49   Dg Chest Port 1 View  03/23/2015  CLINICAL DATA:  54 year old female with fever, nausea and vomiting. History of cervical cancer. EXAM: PORTABLE CHEST 1 VIEW COMPARISON:  10/11/2014 chest radiograph FINDINGS: The cardiomediastinal silhouette is unremarkable. There is no evidence of focal airspace disease, pulmonary edema, suspicious pulmonary nodule/mass, pleural effusion, or pneumothorax. No acute bony abnormalities are identified. IMPRESSION: No active disease. Electronically Signed   By: Margarette Canada M.D.   On: 03/23/2015 11:43   Dg Abd Acute W/chest  03/25/2015  CLINICAL DATA:  Fever and lower abdominal pain EXAM: DG ABDOMEN ACUTE W/ 1V CHEST COMPARISON:  03/23/2015 FINDINGS: Normal heart size.  Left basilar atelectasis. Stable right ureteral stent. Air-fluid levels are scattered across the abdomen in small and large bowel in and ileus pattern. No free intraperitoneal gas. IMPRESSION: Mild left basilar atelectasis. Ileus pattern. Electronically Signed   By: Marybelle Killings M.D.   On: 03/25/2015 09:35    Microbiology: Recent Results (from the past 240 hour(s))  Urine culture     Status: None   Collection Time: 03/21/15  4:45 PM  Result Value Ref Range Status   Specimen Description URINE, RANDOM  Final   Special Requests PATIENT ON FOLLOWING CIPROFLOXACIN  Final   Culture   Final    NO GROWTH 2 DAYS Performed at [facility]    Report Status 03/23/2015 FINAL  Final  Blood culture (routine  single)     Status: None   Collection Time: 03/22/15  1:42 PM  Result Value Ref Range Status   Blood Culture, Routine Culture, Blood  Final    Comment: Final - ===== FINAL REPORT =====NO GROWTH 5 DAYS  Blood Culture (routine x 2)     Status: None   Collection Time: 03/23/15 10:55 AM  Result Value Ref Range Status   Specimen Description BLOOD LEFT ANTECUBITAL  Final   Special Requests BOTTLES DRAWN AEROBIC AND ANAEROBIC 5CC  Final   Culture   Final    NO GROWTH 5 DAYS Performed at [facility]    Report Status 03/28/2015 FINAL  Final  Blood Culture (routine x 2)     Status: None   Collection Time: 03/23/15 11:00 AM  Result Value  Ref Range Status   Specimen Description BLOOD RIGHT ARM  Final   Special Requests BOTTLES DRAWN AEROBIC AND ANAEROBIC 5ML  Final   Culture   Final    NO GROWTH 5 DAYS Performed at [facility]    Report Status 03/28/2015 FINAL  Final  Urine culture     Status: None   Collection Time: 03/23/15 11:03 AM  Result Value Ref Range Status   Specimen Description URINE, CATHETERIZED  Final   Special Requests NONE  Final   Culture   Final    NO GROWTH 1 DAY Performed at [facility]    Report Status 03/24/2015 FINAL  Final  Wet prep, genital     Status: Abnormal   Collection Time: 03/23/15  3:14 PM  Result Value Ref Range Status   Yeast Wet Prep HPF POC NONE SEEN NONE SEEN Final   Trich, Wet Prep NONE SEEN NONE SEEN Final   Clue Cells Wet Prep HPF POC NONE SEEN NONE SEEN Final   WBC, Wet Prep HPF POC FEW (A) NONE SEEN Final  Anaerobic culture     Status: None (Preliminary result)   Collection Time: 03/25/15  3:50 PM  Result Value Ref Range Status   Specimen Description WOUND UTERINE FLUID  Final   Special Requests PATIENT ON FOLLOWING ZOSYN AND VANCOMYCIN  Final   Gram Stain   Final    NO WBC SEEN NO SQUAMOUS EPITHELIAL CELLS SEEN NO ORGANISMS SEEN Performed at [facility]    Culture   Final    NO ANAEROBES ISOLATED;  CULTURE IN PROGRESS FOR 5 DAYS Performed at [facility]    Report Status PENDING  Incomplete  Wound culture     Status: None   Collection Time: 03/25/15  3:50 PM  Result Value Ref Range Status   Specimen Description WOUND ENDOMETRIAL/UTERINE FLUID  Final   Special Requests PATIENT ON FOLLOWING ZOSYN AND VANCOMYCIN  Final   Gram Stain   Final    NO WBC SEEN NO SQUAMOUS EPITHELIAL CELLS SEEN NO ORGANISMS SEEN Performed at [facility]    Culture   Final    NO GROWTH 2 DAYS Performed at [facility]    Report Status 03/28/2015 FINAL  Final     Labs: Basic Metabolic Panel:  Recent Labs Lab 03/23/15 1104 03/24/15 0457 03/25/15 0926 03/26/15 0558 03/28/15 0428  NA 133* 134* 135 139 140  K 3.5 3.6 3.2* 4.3 3.6  CL 97* 102 102 107 105  CO2 26 25 25 26 27   GLUCOSE 115* 102* 96 181* 91  BUN 14 11 9 14 11   CREATININE 1.11* 1.10* 1.10* 1.00 1.12*  CALCIUM 9.2 7.9* 8.3* 8.2* 8.3*   Liver Function Tests:  Recent Labs Lab 03/21/15 1534 03/23/15 1104 03/25/15 0926  AST 10 19 13*  ALT <9 12* 10*  ALKPHOS 70 69 67  BILITOT <0.30 0.9 0.4  PROT 6.8 7.3 6.1*  ALBUMIN 2.8* 3.3* 2.5*   No results for input(s): LIPASE, AMYLASE in the last 168 hours. No results for input(s): AMMONIA in the last 168 hours. CBC:  Recent Labs Lab 03/21/15 1534 03/22/15 1342 03/23/15 1104 03/24/15 0457 03/25/15 0926 03/26/15 0558 03/28/15 0428  WBC 11.8* 12.1* 16.1* 13.8* 18.1* 13.4* 8.7  NEUTROABS 9.6* 9.8* 13.3*  --  15.4*  --   --   HGB 10.9* 10.5* 10.7* 9.0* 9.4* 8.3* 8.5*  HCT 33.0* 31.8* 33.4* 28.2* 28.4* 26.1* 26.6*  MCV 101.5* 100.4  104.0* 103.7* 103.6* 101.6* 102.3*  PLT 368 390 398 301 272 295 332   Cardiac Enzymes: No results for input(s): CKTOTAL, CKMB, CKMBINDEX, TROPONINI in the last 168 hours. BNP: BNP (last 3 results) No results for input(s): BNP in the last 8760 hours.  ProBNP (last 3 results) No results for input(s): PROBNP in the last  8760 hours.  CBG: No results for input(s): GLUCAP in the last 168 hours.     SignedHosie Poisson  [facility] 03/28/2015, 11:05 AM

## 2015-03-28 NOTE — Progress Notes (Signed)
Completed D.C teaching. Answered all questions. Patient will be D/C home with family in stable condition.

## 2015-03-29 ENCOUNTER — Telehealth: Payer: Self-pay | Admitting: Oncology

## 2015-03-29 NOTE — Telephone Encounter (Signed)
pt called to r/s md visit...done....pt ok and aware of new d.t

## 2015-03-30 ENCOUNTER — Other Ambulatory Visit: Payer: BC Managed Care – PPO

## 2015-03-30 ENCOUNTER — Telehealth: Payer: Self-pay

## 2015-03-30 LAB — ANAEROBIC CULTURE: GRAM STAIN: NONE SEEN

## 2015-03-30 NOTE — Telephone Encounter (Signed)
Orders received from [place] to contact the patient and update that her uterus and Cervix biopsies were negative . Patient contacted and updated , patient states understanding , denies further questions or concerns at this time.

## 2015-03-31 ENCOUNTER — Ambulatory Visit: Payer: BC Managed Care – PPO

## 2015-03-31 ENCOUNTER — Ambulatory Visit: Payer: BC Managed Care – PPO | Admitting: Oncology

## 2015-04-01 ENCOUNTER — Ambulatory Visit: Payer: BC Managed Care – PPO

## 2015-04-01 ENCOUNTER — Other Ambulatory Visit: Payer: Self-pay

## 2015-04-01 DIAGNOSIS — C539 Malignant neoplasm of cervix uteri, unspecified: Secondary | ICD-10-CM

## 2015-04-03 ENCOUNTER — Other Ambulatory Visit: Payer: Self-pay | Admitting: Oncology

## 2015-04-03 DIAGNOSIS — C539 Malignant neoplasm of cervix uteri, unspecified: Secondary | ICD-10-CM

## 2015-04-03 MED ORDER — SODIUM CHLORIDE 0.9 % IV SOLN: INTRAVENOUS | Status: DC

## 2015-04-04 ENCOUNTER — Other Ambulatory Visit (HOSPITAL_BASED_OUTPATIENT_CLINIC_OR_DEPARTMENT_OTHER): Payer: BC Managed Care – PPO

## 2015-04-04 ENCOUNTER — Other Ambulatory Visit: Payer: BC Managed Care – PPO

## 2015-04-04 ENCOUNTER — Ambulatory Visit (HOSPITAL_BASED_OUTPATIENT_CLINIC_OR_DEPARTMENT_OTHER): Payer: BC Managed Care – PPO | Admitting: Oncology

## 2015-04-04 ENCOUNTER — Encounter: Payer: Self-pay | Admitting: Oncology

## 2015-04-04 ENCOUNTER — Ambulatory Visit: Payer: BC Managed Care – PPO | Admitting: Oncology

## 2015-04-04 VITALS — BP 148/80 | HR 109 | Temp 98.4°F | Resp 18 | Ht 64.0 in | Wt 150.5 lb

## 2015-04-04 DIAGNOSIS — C531 Malignant neoplasm of exocervix: Secondary | ICD-10-CM

## 2015-04-04 DIAGNOSIS — C530 Malignant neoplasm of endocervix: Secondary | ICD-10-CM

## 2015-04-04 DIAGNOSIS — R809 Proteinuria, unspecified: Secondary | ICD-10-CM

## 2015-04-04 DIAGNOSIS — N1330 Unspecified hydronephrosis: Secondary | ICD-10-CM | POA: Diagnosis not present

## 2015-04-04 DIAGNOSIS — C539 Malignant neoplasm of cervix uteri, unspecified: Secondary | ICD-10-CM

## 2015-04-04 DIAGNOSIS — C7989 Secondary malignant neoplasm of other specified sites: Secondary | ICD-10-CM | POA: Diagnosis not present

## 2015-04-04 LAB — CBC WITH DIFFERENTIAL/PLATELET
BASO%: 0.3 % (ref 0.0–2.0)
Basophils Absolute: 0 10*3/uL (ref 0.0–0.1)
EOS ABS: 0.2 10*3/uL (ref 0.0–0.5)
EOS%: 2.2 % (ref 0.0–7.0)
HEMATOCRIT: 33.2 % — AB (ref 34.8–46.6)
HGB: 10.8 g/dL — ABNORMAL LOW (ref 11.6–15.9)
LYMPH#: 0.9 10*3/uL (ref 0.9–3.3)
LYMPH%: 10.1 % — AB (ref 14.0–49.7)
MCH: 33.4 pg (ref 25.1–34.0)
MCHC: 32.5 g/dL (ref 31.5–36.0)
MCV: 102.8 fL — AB (ref 79.5–101.0)
MONO#: 1.2 10*3/uL — AB (ref 0.1–0.9)
MONO%: 13.7 % (ref 0.0–14.0)
NEUT%: 73.7 % (ref 38.4–76.8)
NEUTROS ABS: 6.6 10*3/uL — AB (ref 1.5–6.5)
PLATELETS: 431 10*3/uL — AB (ref 145–400)
RBC: 3.23 10*6/uL — AB (ref 3.70–5.45)
RDW: 15.8 % — ABNORMAL HIGH (ref 11.2–14.5)
WBC: 8.9 10*3/uL (ref 3.9–10.3)

## 2015-04-04 LAB — COMPREHENSIVE METABOLIC PANEL (CC13)
ALK PHOS: 71 U/L (ref 40–150)
ALT: 18 U/L (ref 0–55)
ANION GAP: 11 meq/L (ref 3–11)
AST: 16 U/L (ref 5–34)
Albumin: 3.1 g/dL — ABNORMAL LOW (ref 3.5–5.0)
BUN: 24.4 mg/dL (ref 7.0–26.0)
CALCIUM: 10 mg/dL (ref 8.4–10.4)
CO2: 28 mEq/L (ref 22–29)
CREATININE: 1.2 mg/dL — AB (ref 0.6–1.1)
Chloride: 99 mEq/L (ref 98–109)
EGFR: 52 mL/min/{1.73_m2} — AB (ref >90)
Glucose: 114 mg/dl (ref 70–140)
Potassium: 4.5 mEq/L (ref 3.5–5.1)
Sodium: 138 mEq/L (ref 136–145)
TOTAL PROTEIN: 7.4 g/dL (ref 6.4–8.3)

## 2015-04-04 LAB — MAGNESIUM (CC13): MAGNESIUM: 1.9 mg/dL (ref 1.5–2.5)

## 2015-04-04 LAB — UA PROTEIN, DIPSTICK - CHCC: PROTEIN: 100 mg/dL

## 2015-04-04 NOTE — Progress Notes (Signed)
OFFICE PROGRESS NOTE   April 04, 2015   Physicians:Emma Carita Pian, MD, Michel Bickers, Leonidas Romberg, Genia Del), Marguerita Merles, Janyth Pupa  INTERVAL HISTORY:   Patient is seen, together with sister in law, in continuing attention to cisplatin taxol avastin in process for recurrent cervical carcinoma. Cycle 6 was delayed due to hospitalization 03-23-15 thru 03-28-15 for infectious illness that improved with drainage of pyometrium by Dr [name] on 03-25-15. Cycle 6  Chemo/ avastin is now scheduled for 11-8 and 04-06-15. Plan is 7 cycles then repeat imaging with PET. Patient had CT AP 03-23-15 at that hospital admission, unchanged compared with 02-21-15. She had uterine ad cervical biopsies at same procedure 03-25-15, with necrotic and inflammatory tissue, no malignancy seen.   Patient had no positive cultures from that hospitalization other than a few bacteroides fragilis from the pyometrium. She had IV antibiotics in hospital and 5 additional days of augmentin beginning 03-28-15. She had diarrhea while on augmentin, with 5 stools yesterday, but has had only one stool today. She is drinking 2-3 Boost daily, appetite poor with the recent problems and antibiotics. She has mostly been out of work. Distal feet numb, not painful, able to walk without difficulty, no neuropathy in fingers. She is voiding. No bleeding. No abdominal or pelvic pain. No more fever or chills. No vaginal drainage now. No respiratory symptoms.  Remainder of 10 point review of systems negative/ unchanged.   No central line Right ureteral stent Has not had flu vaccine yet    ONCOLOGIC HISTORY  Patient had been in usual good health, with no previous abnormal PAP smears, when she developed frequent urination and vaginal bleeding in late 2015. She had endometrial biopsy by Dr Janyth Pupa [phone]) with adenocarcinoma. PET in [facility] system 08-26-00 had hypermetabolic uptake within the uterine cervix and  lower uterine segment and metastatic adenopathy within the bilateral pelvis and periaortic retroperitoneum. Dr Genia Del and Dr Marguerita Merles treated with pelvic and para-aortic radiation given with 6 cycles of sensitizing CDDP, and HDR x5 from 07-06-14 thru 09-03-14. Patient had a difficult time with nausea, vomiting, diarrhea and hypokalemia/ hypomagnesemia thru treatment. Patient used ODT zofran and ativan during chemotherapy. She had a difficult time doing oral prehydration for CDDP. She had no peripheral neuropathy with the CDDP. There was concern by completion of the treatment that she did not have optimal response, with plan for repeat evaluation at 6 weeks from completion of the treatment. Prior to planned reevaluation, patient began having daily fevers for possibly 4 weeks, as high as 102 degrees x 2 weeks prior to 10-11-14; by presentation at ED she also had increased right flank pain. She had CT at [facility] on 10-08-14, this compared with PET of 05-2014 showed increase in subcapsular fluid collection right kidney and right hydronephrosis, increase in size of endometrial canal/ uterus/ right adnexa, and improvement in retroperitoneal, common iliac and bilateral pelvic sidewall adenopathy. She was admitted to [facility] ICU 5-16/ 10-12-14 with sepsis from peptostreptococcus. She had percutaneous drainage by IR of 635 cc dark thin fluid from right perinephric area on 10-12-14, with perinephric drain removed by urology on 10-18-14. She had right ureteral stent placement by Dr Junious Silk on 10-13-14. She developed C.difficile diarrhea, begun on flagyl 10-15-14 x 2 weeks, also on ceftin during that time. She saw Dr [name] in consultation on 11-05-14, with cervix grossly normal appearance, firm to palpation with right anterior extension, and purulent material from cervical os with manipulation, no palpable adnexal masses,  induration R>L parametrium. PET 09-30-41 had hypermetabolic tissue  thickening at cecum, + small right sided mesenteric and inguinal nodes, foci in myometriium and in region of cervix, small right perinephric fluid collection and dilated fluid filled endometrium. Dr [name] saw her following the PET, with recommendation for systemic treatment of the metastatic disease with CDDP, taxol and avastin. First CDDP Taxol 11-19-14, neutropenic day 14. Repeat CT showed essentially stable, minimal right perinephric fluid on 12-06-14, prior to cycle 2 CDDP taxol 7-14 with first avastin 12-10-14 and neulasta. CT AP 02-21-15 after 4 cycles CDDP taxol/ 3 cycles avastin showed improvement, plan to continue x another 3 cycles and reevaluate. Most recent chemotherapy was CDDP taxol avastin 03-01-15. She had cystoscopy, bladder biopsy with fulguration, right diagnostic ureteroscopy,right retrograde pyelogram, and right ureteral stent exchange by Dr Junious Silk on 02-18-15. 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 [facility], with discomfort thought bladder or low pelvis, fevers to 100, poor po intake with some nausea. Urine culture was sent 10-24 (on cipro), no growth so far. She was given IVF and had blood culture done at [facility] on 10-25 (no growth so far, no shaking chills at time of that blood culture). Temperatures were as high as 102 by 03-23-15 with shaking chills and she was admitted 03-23-15.    Objective:  Vital signs in last 24 hours:  BP 148/80 mmHg  Pulse 109  Temp(Src) 98.4 F (36.9 C) (Oral)  Resp 18  Ht 5' 4"  (1.626 m)  Wt 150 lb 8 oz (68.266 kg)  BMI 25.82 kg/m2  SpO2 100%  LMP 11/05/2011 Weight down 8 lbs from scale in hospital Alert, oriented and appropriate. Ambulatory without assistance. Looks tired but NAD. Alopecia  HEENT:PERRL, sclerae not  icteric. Oral mucosa somewhat moist without lesions, posterior pharynx clear.  Neck supple. No JVD.  Lymphatics:no cervical,supraclavicular or inguinal adenopathy Resp: clear to auscultation bilaterally and normal percussion bilaterally Cardio: regular rate and rhythm. No gallop. GI: soft, nontender, not distended, no mass or organomegaly. Normally active bowel sounds.  Musculoskeletal/ Extremities: without pitting edema, cords, tenderness Neuro: no peripheral neuropathy. Otherwise nonfocal Skin without rash, ecchymosis, petechiae   Lab Results:  Results for orders placed or performed in visit on 04/04/15  CBC with Differential  Result Value Ref Range   WBC 8.9 3.9 - 10.3 10e3/uL   NEUT# 6.6 (H) 1.5 - 6.5 10e3/uL   HGB 10.8 (L) 11.6 - 15.9 g/dL   HCT 33.2 (L) 34.8 - 46.6 %   Platelets 431 (H) 145 - 400 10e3/uL   MCV 102.8 (H) 79.5 - 101.0 fL   MCH 33.4 25.1 - 34.0 pg   MCHC 32.5 31.5 - 36.0 g/dL   RBC 3.23 (L) 3.70 - 5.45 10e6/uL   RDW 15.8 (H) 11.2 - 14.5 %   lymph# 0.9 0.9 - 3.3 10e3/uL   MONO# 1.2 (H) 0.1 - 0.9 10e3/uL   Eosinophils Absolute 0.2 0.0 - 0.5 10e3/uL   Basophils Absolute 0.0 0.0 - 0.1 10e3/uL   NEUT% 73.7 38.4 - 76.8 %   LYMPH% 10.1 (L) 14.0 - 49.7 %   MONO% 13.7 0.0 - 14.0 %   EOS% 2.2 0.0 - 7.0 %   BASO% 0.3 0.0 - 2.0 %  Comprehensive metabolic panel (Cmet) - [facility]  Result Value Ref Range  Sodium 138 136 - 145 mEq/L   Potassium 4.5 3.5 - 5.1 mEq/L   Chloride 99 98 - 109 mEq/L   CO2 28 22 - 29 mEq/L   Glucose 114 70 - 140 mg/dl   BUN 24.4 7.0 - 26.0 mg/dL   Creatinine 1.2 (H) 0.6 - 1.1 mg/dL   Total Bilirubin <0.30 0.20 - 1.20 mg/dL   Alkaline Phosphatase 71 40 - 150 U/L   AST 16 5 - 34 U/L   ALT 18 0 - 55 U/L   Total Protein 7.4 6.4 - 8.3 g/dL   Albumin 3.1 (L) 3.5 - 5.0 g/dL   Calcium 10.0 8.4 - 10.4 mg/dL   Anion Gap 11 3 - 11 mEq/L   EGFR 52 (L) >90 ml/min/1.73 m2  Urine protein by dipstick - [facility]  Result Value Ref Range   Protein, ur 100  Negative- <30 mg/dL  Magnesium  Result Value Ref Range   Magnesium 1.9 1.5 - 2.5 mg/dl     Studies/Results: CT ABDOMEN AND PELVIS WITH CONTRAST  03-23-15  COMPARISON: CT Abdomen and Pelvis 02/21/2015 and earlier.  FINDINGS: Negative lung bases. No pericardial or pleural effusion.  Stable visualized osseous structures.  Stable presacral stranding or trace fluid. Rectum remains within normal limits.  Stable post treatment appearance of the uterus, cervix and vagina compared to September. Stable all appearance of the at adnexa and right gonadal vein.  Sigmoid colon remains within normal limits. Retained stool throughout the low left colon and transverse colon. Oral contrast has reached the right colon. There is continued circumferential wall thickening in the distal ileum looped in the right lower quadrant located adjacent to the uterus and right adnexa (series 2, image 64). The terminal ileum itself appears relatively spared. No dilated small bowel. Decompressed stomach and duodenum.  No abdominal free fluid or free air. Surgically absent gallbladder. Liver, spleen, pancreas, and adrenal glands remain within normal limits. Portal venous system is patent. Major arterial structures in the abdomen and pelvis remain patent.  Previously identified right lower quadrant mesenteric lymph nodes are stable to mildly regressed (e.g. Series 2, image 44 today versus same series and image previously). No new or increased lymphadenopathy.  Both kidneys are stable since September. Mild prominence of the left renal collecting system but preserved delayed contrast excretion. Right double-J ureteral stent remains in place. No gas within the bladder today. Right renal subcapsular fluid collection is unchanged. Right renal enhancement and contrast excretion is stable.  Stable all ventral lower abdominal incisional scar. Stable all inguinal lymph nodes.  IMPRESSION: No finding  identified to explain new fever. Stable CT abdomen and pelvis since the restaging study on 02/21/2015   [place], [facility] Collected: 03/25/2015 Client: [facility] Accession: [id number] Received: 03/25/2015 Everitt Amber, MD NAL DIAGNOSIS Diagnosis 1. Endometrium, curettage - ACUTE INFLAMMATORY EXUDATE AND NECROSIS. - SEE MICROSCOPIC DESCRIPTION. 2. Cervix, biopsy - ACUTE INFLAMMATORY EXUDATE WITH MUCUS AND NECROSIS. - SEE MICROSCOPIC DESCRIPTION. Microscopic Comment 1. , 2. Both of the specimens consist of acute inflammatory exudate admixed with necrotic tissue fragments. No non-necrotic tissue is present in either specimen.    Medications: I have reviewed the patient's current medications.  DISCUSSION Recent diarrhea likely from augmentin, but would need to check C diff if this continues; needs to push fluids. She feels able to proceed with chemo as scheduled this week, understands that the peripheral neuropathy in feet is from CDDP and/ or taxol and may not resolve completely even when those  agents are completed, but is in agreement with continuing treatment with upcoming cycle. Patient had thought upcoming treatment was last prior to reimaging, however Dr [name] had actually recommended 3 cycles beyond cycle 4. Patient disappointed but agrees with this plan; will reimage with PET after cycle 7. WIll repeat UA protein after she is better hydrated tomorrow. She did not complete the 24 hour urine for total protein previously due to hospitalization, but will need this done if UA protein >=100 again  Per my direct conversation with Dr [name] today, ok for avastin despite small endometrial and cervical biopsies done 03-25-15.  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 Improvement with CDDP taxol avastin by CT 02-21-15 and stable by CT 03-23-15, cycle 6 delayed due to hospitalization but stable for this 11-8 and 11-9. She will have on pro neulasta and I will see her back with labs 04-25-15. Plan PET after cycle 7. 2.admission late Oct with fever, chills from pyometrium. Antibiotics now completed.  3. iron deficiency anemia from blood loss and chemo anemia,  oral iron 4.No central catheter at patient's preference 5.previous hypoK and hypoMg related to CDDP, both good by labs now. No change to supplemental K and Mg with IVF for CDDP, follow. 6.intolerance morphine and codeine. 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 Would need C diff checked if diarrhea continues.  12.post cholecystectomy   All questions answered. Chemo, avastin, IVF and neulasta orders confirmed. Time spent 30 min including >50% counseling and coordination of care. Cc Su Grand, Bartholomew Boards, MD   04/04/2015, 7:46 PM

## 2015-04-05 ENCOUNTER — Other Ambulatory Visit: Payer: Self-pay | Admitting: Oncology

## 2015-04-05 ENCOUNTER — Ambulatory Visit (HOSPITAL_BASED_OUTPATIENT_CLINIC_OR_DEPARTMENT_OTHER): Payer: BC Managed Care – PPO

## 2015-04-05 ENCOUNTER — Telehealth: Payer: Self-pay | Admitting: *Deleted

## 2015-04-05 ENCOUNTER — Encounter: Payer: Self-pay | Admitting: Oncology

## 2015-04-05 ENCOUNTER — Telehealth: Payer: Self-pay | Admitting: Oncology

## 2015-04-05 VITALS — BP 149/83 | HR 98 | Temp 98.3°F | Resp 18

## 2015-04-05 DIAGNOSIS — C7989 Secondary malignant neoplasm of other specified sites: Secondary | ICD-10-CM

## 2015-04-05 DIAGNOSIS — C530 Malignant neoplasm of endocervix: Secondary | ICD-10-CM | POA: Diagnosis not present

## 2015-04-05 DIAGNOSIS — C786 Secondary malignant neoplasm of retroperitoneum and peritoneum: Secondary | ICD-10-CM | POA: Diagnosis not present

## 2015-04-05 DIAGNOSIS — Z5111 Encounter for antineoplastic chemotherapy: Secondary | ICD-10-CM | POA: Diagnosis not present

## 2015-04-05 DIAGNOSIS — C539 Malignant neoplasm of cervix uteri, unspecified: Secondary | ICD-10-CM

## 2015-04-05 MED ORDER — DIPHENHYDRAMINE HCL 50 MG/ML IJ SOLN
50.0000 mg | Freq: Once | INTRAMUSCULAR | Status: AC
  Administered 2015-04-05: 50 mg via INTRAVENOUS

## 2015-04-05 MED ORDER — DIPHENHYDRAMINE HCL 50 MG/ML IJ SOLN
INTRAMUSCULAR | Status: AC
  Filled 2015-04-05: qty 1

## 2015-04-05 MED ORDER — FAMOTIDINE IN NACL 20-0.9 MG/50ML-% IV SOLN
20.0000 mg | Freq: Once | INTRAVENOUS | Status: AC
  Administered 2015-04-05: 20 mg via INTRAVENOUS

## 2015-04-05 MED ORDER — SODIUM CHLORIDE 0.9 % IV SOLN
40.0000 mg/m2 | Freq: Once | INTRAVENOUS | Status: AC
  Administered 2015-04-05: 70 mg via INTRAVENOUS
  Filled 2015-04-05: qty 70

## 2015-04-05 MED ORDER — SODIUM CHLORIDE 0.9 % IV SOLN
Freq: Once | INTRAVENOUS | Status: AC
  Administered 2015-04-05: 10:00:00 via INTRAVENOUS

## 2015-04-05 MED ORDER — POTASSIUM CHLORIDE 2 MEQ/ML IV SOLN
INTRAVENOUS | Status: DC
  Administered 2015-04-05: 10:00:00 via INTRAVENOUS
  Filled 2015-04-05: qty 1000

## 2015-04-05 MED ORDER — SODIUM CHLORIDE 0.9 % IV SOLN
Freq: Once | INTRAVENOUS | Status: AC
  Administered 2015-04-05: 12:00:00 via INTRAVENOUS
  Filled 2015-04-05: qty 5

## 2015-04-05 MED ORDER — SODIUM CHLORIDE 0.9 % IV SOLN
Freq: Once | INTRAVENOUS | Status: AC
  Administered 2015-04-05: 12:00:00 via INTRAVENOUS
  Filled 2015-04-05: qty 8

## 2015-04-05 MED ORDER — LORAZEPAM 2 MG/ML IJ SOLN
INTRAMUSCULAR | Status: AC
  Filled 2015-04-05: qty 1

## 2015-04-05 MED ORDER — FAMOTIDINE IN NACL 20-0.9 MG/50ML-% IV SOLN
INTRAVENOUS | Status: AC
  Filled 2015-04-05: qty 50

## 2015-04-05 MED ORDER — DEXTROSE 5 % IV SOLN
135.0000 mg/m2 | Freq: Once | INTRAVENOUS | Status: AC
  Administered 2015-04-05: 234 mg via INTRAVENOUS
  Filled 2015-04-05: qty 39

## 2015-04-05 MED ORDER — LORAZEPAM 2 MG/ML IJ SOLN
0.5000 mg | Freq: Once | INTRAMUSCULAR | Status: AC
  Administered 2015-04-05: 0.5 mg via INTRAVENOUS

## 2015-04-05 NOTE — Progress Notes (Unsigned)
Medical Oncology  Discussed with infusion RN, patient here for chemo. HR 108-110, BP ok, a little anxious. Had total 3 bowel movements on 11-7 and one today, soft formed.  RN to run prehydration fluid and give ativan, will recheck HR. Urine protein 100, due avastin on 11-9. Will recheck urine protein when she comes on 11-9, treat then if meets parameters.  Will get the 24 hour urine protein also this week or next, regardless of dipstick protein on 11-9. This had been planned prior to recent hospitalization but not done.  L.Haylei Cobin, MD.

## 2015-04-05 NOTE — Progress Notes (Unsigned)
0945: HR 110 [name] aware and orders to start Cisplatin fluids and give Ativan from treatment plan and then recheck. Urine Protein 100 today Dr. Constance Haw aware and states she will put orders in for pt to have urine protein rechecked tomorrow 04/06/15 prior to receiving Avastin. Pt aware and verbalizes understanding.  HR recheck 5, Louise RN, Dr. Mariana Kaufman nurse aware.

## 2015-04-05 NOTE — Telephone Encounter (Signed)
Added lab prior to 11/9 inf per 11/8 pof with comment to given patient container for 24 hr urine. Spoke with charge patient will be given avs report with appointments while in inf area.

## 2015-04-05 NOTE — Telephone Encounter (Signed)
Per staff message and POF I have scheduled appts. Advised scheduler of appts. JMW  

## 2015-04-05 NOTE — Patient Instructions (Signed)
[facility] Discharge Instructions for Patients Receiving Chemotherapy  Today you received the following chemotherapy agents: Taxol and Cisplatin   To help prevent nausea and vomiting after your treatment, we encourage you to take your nausea medication as directed.   If you develop nausea and vomiting that is not controlled by your nausea medication, call the clinic.   BELOW ARE SYMPTOMS THAT SHOULD BE REPORTED IMMEDIATELY:  *FEVER GREATER THAN 100.5 F  *CHILLS WITH OR WITHOUT FEVER  NAUSEA AND VOMITING THAT IS NOT CONTROLLED WITH YOUR NAUSEA MEDICATION  *UNUSUAL SHORTNESS OF BREATH  *UNUSUAL BRUISING OR BLEEDING  TENDERNESS IN MOUTH AND THROAT WITH OR WITHOUT PRESENCE OF ULCERS  *URINARY PROBLEMS  *BOWEL PROBLEMS  UNUSUAL RASH Items with * indicate a potential emergency and should be followed up as soon as possible.  Feel free to call the clinic you have any questions or concerns. The clinic phone number is [phone].  Please show the CHEMO ALERT CARD at check-in to the Emergency Department and triage nurse.   

## 2015-04-06 ENCOUNTER — Other Ambulatory Visit: Payer: Self-pay | Admitting: Medical Oncology

## 2015-04-06 ENCOUNTER — Ambulatory Visit (HOSPITAL_BASED_OUTPATIENT_CLINIC_OR_DEPARTMENT_OTHER): Payer: BC Managed Care – PPO

## 2015-04-06 ENCOUNTER — Other Ambulatory Visit: Payer: Self-pay | Admitting: Oncology

## 2015-04-06 ENCOUNTER — Other Ambulatory Visit (HOSPITAL_BASED_OUTPATIENT_CLINIC_OR_DEPARTMENT_OTHER): Payer: BC Managed Care – PPO

## 2015-04-06 DIAGNOSIS — R809 Proteinuria, unspecified: Secondary | ICD-10-CM

## 2015-04-06 DIAGNOSIS — C7989 Secondary malignant neoplasm of other specified sites: Secondary | ICD-10-CM

## 2015-04-06 DIAGNOSIS — E860 Dehydration: Secondary | ICD-10-CM

## 2015-04-06 DIAGNOSIS — C530 Malignant neoplasm of endocervix: Secondary | ICD-10-CM | POA: Diagnosis not present

## 2015-04-06 DIAGNOSIS — C539 Malignant neoplasm of cervix uteri, unspecified: Secondary | ICD-10-CM

## 2015-04-06 DIAGNOSIS — C538 Malignant neoplasm of overlapping sites of cervix uteri: Secondary | ICD-10-CM

## 2015-04-06 LAB — UA PROTEIN, DIPSTICK - CHCC: PROTEIN: 100 mg/dL

## 2015-04-06 MED ORDER — SODIUM CHLORIDE 0.9 % IV SOLN
1000.0000 mL | Freq: Once | INTRAVENOUS | Status: AC
  Administered 2015-04-06: 1000 mL via INTRAVENOUS

## 2015-04-06 MED ORDER — PEGFILGRASTIM INJECTION 6 MG/0.6ML ~~LOC~~
6.0000 mg | PREFILLED_SYRINGE | Freq: Once | SUBCUTANEOUS | Status: DC
Start: 2015-04-06 — End: 2015-04-06
  Filled 2015-04-06: qty 0.6

## 2015-04-06 MED ORDER — PEGFILGRASTIM 6 MG/0.6ML ~~LOC~~ PSKT
6.0000 mg | PREFILLED_SYRINGE | Freq: Once | SUBCUTANEOUS | Status: AC
  Administered 2015-04-06: 6 mg via SUBCUTANEOUS
  Filled 2015-04-06: qty 0.6

## 2015-04-08 LAB — PROTEIN, URINE, 24 HOUR
COLLECTION INTERVAL-UPROT: 24 h
PROTEIN 24H UR: 377 mg/(24.h) — AB (ref <150)
PROTEIN, URINE: 29 mg/dL — AB (ref 5–24)
URINE TOTAL VOLUME-UPROT: 1300 mL

## 2015-04-14 ENCOUNTER — Telehealth: Payer: Self-pay | Admitting: Oncology

## 2015-04-14 ENCOUNTER — Other Ambulatory Visit: Payer: Self-pay | Admitting: *Deleted

## 2015-04-14 ENCOUNTER — Telehealth: Payer: Self-pay | Admitting: *Deleted

## 2015-04-14 ENCOUNTER — Other Ambulatory Visit: Payer: Self-pay | Admitting: Oncology

## 2015-04-14 NOTE — Telephone Encounter (Signed)
Called and spoke with patient and she is aware of her appointments °

## 2015-04-14 NOTE — Telephone Encounter (Signed)
Pt called and left message requesting a follow up appt with [name] for Stacy Webster  04/15/15.  Per message, pt stated she had chemo on 11/8 with Neulasta on 11/9.  Pt has been breaking out in cold sweat, and has increased temp at night.  Pt also would like to discuss with [name] about her latest protein studies results;  Pt also would like to change chemo dates since pt did not think she is strong enough to take chemo at this time. Attempted to call pt back unsuccessfully.  Left message on voice mail requesting a call back from pt. Pt's  Phone    [phone].

## 2015-04-14 NOTE — Telephone Encounter (Signed)
Re fever at hs, seeing LL and chemo dates  Please get details re fever when you reach patient: how high, any shaking chills, any associated symptoms, what has she tried for it, any diarrhea now, any vaginal drainage or pelvic cramping now  Should get blood cultures today if shaking chills; let me know rest of details when you get them  I am not in office on 11-18. If acute problems could see Cyndee in addition to visit to me as below, or can always go to ED I can see her 11-21 if 1200 or 1230 still open + lab. I have not put in POF yet.   Next chemo is not scheduled until 11-30 as far as I can tell in EMR  Thank you

## 2015-04-14 NOTE — Telephone Encounter (Signed)
Spoke with pt and was informed re:  Pt has cold sweat during day time,  Very tired and cannot stand for more than 5 minutes.  Pt has increased temp for last 3 - 4 nights  Between 100 - 100.3 ;  Denied shaking chills, denied diarrhea, no abdominal cramping.  Stated she took Ibuprofen 400mg  for increased temp with relief within minutes. Had some nausea this am but took antiemetics with relief. No vomiting. Has constipation problem even though pt has been taking stool softeners daily.  Informed pt that she can take Miralax , eating apples to help with constipation.  Pt has been drinking at least 70+ ounces of water daily. Informed pt that [name] can see pt on 04-18-15 .  Pt agreed  to have the appt on Monday.   Pt understood if symptoms worsened, she needs to go to ER for evaluation, and/or can see Jenny Reichmann, symptom management on 04-15-15.   Pt's  Phone   [phone].

## 2015-04-15 ENCOUNTER — Other Ambulatory Visit: Payer: Self-pay | Admitting: *Deleted

## 2015-04-15 DIAGNOSIS — C539 Malignant neoplasm of cervix uteri, unspecified: Secondary | ICD-10-CM

## 2015-04-16 ENCOUNTER — Other Ambulatory Visit: Payer: Self-pay | Admitting: Oncology

## 2015-04-16 DIAGNOSIS — C539 Malignant neoplasm of cervix uteri, unspecified: Secondary | ICD-10-CM

## 2015-04-16 DIAGNOSIS — R509 Fever, unspecified: Secondary | ICD-10-CM

## 2015-04-16 DIAGNOSIS — C7989 Secondary malignant neoplasm of other specified sites: Secondary | ICD-10-CM

## 2015-04-18 ENCOUNTER — Other Ambulatory Visit (HOSPITAL_BASED_OUTPATIENT_CLINIC_OR_DEPARTMENT_OTHER): Payer: BC Managed Care – PPO

## 2015-04-18 ENCOUNTER — Ambulatory Visit (HOSPITAL_BASED_OUTPATIENT_CLINIC_OR_DEPARTMENT_OTHER): Payer: Managed Care, Other (non HMO) | Admitting: Oncology

## 2015-04-18 VITALS — BP 143/76 | HR 113 | Temp 99.7°F | Resp 18 | Ht 64.0 in | Wt 151.4 lb

## 2015-04-18 DIAGNOSIS — N719 Inflammatory disease of uterus, unspecified: Secondary | ICD-10-CM

## 2015-04-18 DIAGNOSIS — C539 Malignant neoplasm of cervix uteri, unspecified: Secondary | ICD-10-CM

## 2015-04-18 DIAGNOSIS — D509 Iron deficiency anemia, unspecified: Secondary | ICD-10-CM

## 2015-04-18 DIAGNOSIS — R509 Fever, unspecified: Secondary | ICD-10-CM

## 2015-04-18 DIAGNOSIS — C7989 Secondary malignant neoplasm of other specified sites: Secondary | ICD-10-CM | POA: Diagnosis not present

## 2015-04-18 DIAGNOSIS — R809 Proteinuria, unspecified: Secondary | ICD-10-CM

## 2015-04-18 LAB — CBC WITH DIFFERENTIAL/PLATELET
BASO%: 1.1 % (ref 0.0–2.0)
Basophils Absolute: 0.1 10*3/uL (ref 0.0–0.1)
EOS%: 1.4 % (ref 0.0–7.0)
Eosinophils Absolute: 0.1 10*3/uL (ref 0.0–0.5)
HCT: 31 % — ABNORMAL LOW (ref 34.8–46.6)
HGB: 10.3 g/dL — ABNORMAL LOW (ref 11.6–15.9)
LYMPH%: 6.2 % — AB (ref 14.0–49.7)
MCH: 33.3 pg (ref 25.1–34.0)
MCHC: 33.1 g/dL (ref 31.5–36.0)
MCV: 100.7 fL (ref 79.5–101.0)
MONO#: 0.8 10*3/uL (ref 0.1–0.9)
MONO%: 7.8 % (ref 0.0–14.0)
NEUT%: 83.5 % — ABNORMAL HIGH (ref 38.4–76.8)
NEUTROS ABS: 8.7 10*3/uL — AB (ref 1.5–6.5)
PLATELETS: 334 10*3/uL (ref 145–400)
RBC: 3.08 10*6/uL — AB (ref 3.70–5.45)
RDW: 17.1 % — ABNORMAL HIGH (ref 11.2–14.5)
WBC: 10.4 10*3/uL — AB (ref 3.9–10.3)
lymph#: 0.6 10*3/uL — ABNORMAL LOW (ref 0.9–3.3)

## 2015-04-18 LAB — COMPREHENSIVE METABOLIC PANEL (CC13)
ALT: 10 U/L (ref 0–55)
ANION GAP: 13 meq/L — AB (ref 3–11)
AST: 10 U/L (ref 5–34)
Albumin: 3 g/dL — ABNORMAL LOW (ref 3.5–5.0)
Alkaline Phosphatase: 88 U/L (ref 40–150)
BILIRUBIN TOTAL: 0.34 mg/dL (ref 0.20–1.20)
BUN: 19.4 mg/dL (ref 7.0–26.0)
CO2: 24 meq/L (ref 22–29)
CREATININE: 1 mg/dL (ref 0.6–1.1)
Calcium: 9.6 mg/dL (ref 8.4–10.4)
Chloride: 98 mEq/L (ref 98–109)
EGFR: 65 mL/min/{1.73_m2} — ABNORMAL LOW (ref >90)
GLUCOSE: 114 mg/dL (ref 70–140)
Potassium: 3.9 mEq/L (ref 3.5–5.1)
SODIUM: 135 meq/L — AB (ref 136–145)
TOTAL PROTEIN: 7.2 g/dL (ref 6.4–8.3)

## 2015-04-18 LAB — URINALYSIS, MICROSCOPIC - CHCC
BILIRUBIN (URINE): NEGATIVE
Glucose: NEGATIVE mg/dL
KETONES: NEGATIVE mg/dL
Leukocyte Esterase: NEGATIVE
Nitrite: NEGATIVE
Protein: 100 mg/dL
SPECIFIC GRAVITY, URINE: 1.03 (ref 1.003–1.035)
Urobilinogen, UR: 0.2 mg/dL (ref 0.2–1)
pH: 6 (ref 4.6–8.0)

## 2015-04-18 MED ORDER — TRAMADOL HCL 50 MG PO TABS: 50.0000 mg | ORAL_TABLET | Freq: Three times a day (TID) | ORAL | Status: DC

## 2015-04-18 MED ORDER — METRONIDAZOLE 500 MG PO TABS: 500.0000 mg | ORAL_TABLET | Freq: Three times a day (TID) | ORAL | Status: DC

## 2015-04-18 MED ORDER — FENTANYL 12 MCG/HR TD PT72: 12.5000 ug | MEDICATED_PATCH | TRANSDERMAL | Status: DC

## 2015-04-18 MED ORDER — AMOXICILLIN 500 MG PO TABS: 500.0000 mg | ORAL_TABLET | Freq: Three times a day (TID) | ORAL | Status: DC

## 2015-04-18 NOTE — Patient Instructions (Addendum)
Antibiotics for pyometra: Amoxicillin 500 mg every 8 hrs Flagyl (metronidazole) 500 mg every 8 hrs  Pain medicine Fentanyl (duragesic) patch 12.5 mcg every 72 hrs. It will take at least 24 hours for you to be able to tell any improvement after first patch applied Tramadol 50 mg every 8 hrs as needed for pain, in addition to the patch OK to continue ibuprofen - sug gest taking this ~ an hour before bed this week whether or not fever. You need food on stomach with each dose of ibuprofen.  You will need to use miralax or senokotS (one capful or 2 tablets) 1-2x daily as the pain medicine will make you more constipated.  Call to let us know how you are on Wednesday this week Do not work until at least after you see MD on 11-28

## 2015-04-19 LAB — URINE CULTURE

## 2015-04-20 ENCOUNTER — Encounter: Payer: Self-pay | Admitting: Oncology

## 2015-04-20 ENCOUNTER — Telehealth: Payer: Self-pay

## 2015-04-20 DIAGNOSIS — R509 Fever, unspecified: Secondary | ICD-10-CM | POA: Insufficient documentation

## 2015-04-20 NOTE — Telephone Encounter (Signed)
[name] states that her temperature remains ~99.4.  No shaking chills. Her pain in the right buttock remains ~3-4/10 and the vaginal pain has decreased to 1-2/10 with the Durgesic patch of 12.5 mg and tramadol 50 mg bid.  She is comfortable with this pain level.   She will increase the Senokot-S to 2 tabs bid from 1 bid as her stools are moving daily but hard. She states that her energy level is improving. She will keep appointment with Dr. [name] as scheduled +/- Dr. Mariana Kaufman visit.

## 2015-04-20 NOTE — Progress Notes (Signed)
OFFICE PROGRESS NOTE   April 18, 2015  Physicians:Emma Carita Pian, MD, Michel Bickers, Leonidas Romberg, Genia Del), Marguerita Merles, Janyth Pupa  INTERVAL HISTORY:  Patient is seen, together with sister in law, earlier than planned due to recurrent fevers, fatigue/ weakness and increased pain in right buttocks. She is under treatment for recurrent cervical cancer, last treated with cycle 6 CDDP taxol on 04-05-15 / avastin held 04-06-15 with increased urine protein. She did have neulasta on 04-06-15.   Patient was hospitalized 10-26 thru 03-28-15 with fever which resolved with drainage of pyometrium. Only positive cultures late Oct were bacteroides fragilis from pyometrium. Last antibiotic was augmentin completed 04-01-15.   Patient has had intermittent fever again since 04-08-15, mostly at night as high as 100.3, no shaking chills. Fever improves with ibuprofen 400 mg after temp elevates at night and occasionally also during day. She feels weak and diaphoretic when she stands for longer than a few minutes. She has more pain right buttock which is persistent, better when sits leaning forward. She had some foul smelling vaginal drainage on 04-15-15 followed by no fever for a day. She has had no diarrhea, has had constipation. Hemorrhoids not too bothersome. She denies any SOB, cough, sputum, chest pain.  Peripheral neuropathy is persistent but not more bothersome. No skin rash.  Remainder of 10 point Review of Systems negative.  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  She did not fill tramadol prescription given at hospital DC. She does not tolerate hydrocodone, codeine, MS all causing nausea. She tolerated fentanyl with stent procedure.    FLU VACCINE HELD AGAIN TODAY WITH FEVER No central line Right ureteral stent  ONCOLOGIC HISTORY Patient had been in usual good health, with no previous abnormal PAP smears, when she  developed frequent urination and vaginal bleeding in late 2015. She had endometrial biopsy by Dr Janyth Pupa [phone]) with adenocarcinoma. PET in [place] system 3-53-61 had hypermetabolic uptake within the uterine cervix and lower uterine segment and metastatic adenopathy within the bilateral pelvis and periaortic retroperitoneum. Dr Genia Del and Dr Marguerita Merles treated with pelvic and para-aortic radiation given with 6 cycles of sensitizing CDDP, and HDR x5 from 07-06-14 thru 09-03-14. Patient had a difficult time with nausea, vomiting, diarrhea and hypokalemia/ hypomagnesemia thru treatment. Patient used ODT zofran and ativan during chemotherapy. She had a difficult time doing oral prehydration for CDDP. She had no peripheral neuropathy with the CDDP. There was concern by completion of the treatment that she did not have optimal response, with plan for repeat evaluation at 6 weeks from completion of the treatment. Prior to planned reevaluation, patient began having daily fevers for possibly 4 weeks, as high as 102 degrees x 2 weeks prior to 10-11-14; by presentation at ED she also had increased right flank pain. She had CT at [facility] on 10-08-14, this compared with PET of 05-2014 showed increase in subcapsular fluid collection right kidney and right hydronephrosis, increase in size of endometrial canal/ uterus/ right adnexa, and improvement in retroperitoneal, common iliac and bilateral pelvic sidewall adenopathy. She was admitted to [facility] ICU 5-16/ 10-12-14 with sepsis from peptostreptococcus. She had percutaneous drainage by IR of 635 cc dark thin fluid from right perinephric area on 10-12-14, with perinephric drain removed by urology on 10-18-14. She had right ureteral stent placement by Dr Junious Silk on 10-13-14. She developed C.difficile diarrhea, begun on flagyl 10-15-14 x 2 weeks, also on ceftin during  that time. She saw Dr [name] in consultation on 11-05-14, with cervix  grossly normal appearance, firm to palpation with right anterior extension, and purulent material from cervical os with manipulation, no palpable adnexal masses, induration R>L parametrium. PET 0-5-69 had hypermetabolic tissue thickening at cecum, + small right sided mesenteric and inguinal nodes, foci in myometriium and in region of cervix, small right perinephric fluid collection and dilated fluid filled endometrium. Dr [name] saw her following the PET, with recommendation for systemic treatment of the metastatic disease with CDDP, taxol and avastin. First CDDP Taxol 11-19-14, neutropenic day 14. Repeat CT showed essentially stable, minimal right perinephric fluid on 12-06-14, prior to cycle 2 CDDP taxol 7-14 with first avastin 12-10-14 and neulasta. CT AP 02-21-15 after 4 cycles CDDP taxol/ 3 cycles avastin showed improvement, plan to continue x another 3 cycles and reevaluate. Most recent chemotherapy was CDDP taxol avastin 03-01-15. She had cystoscopy, bladder biopsy with fulguration, right diagnostic ureteroscopy,right retrograde pyelogram, and right ureteral stent exchange by Dr Junious Silk on 02-18-15. 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 [facility], with discomfort thought bladder or low pelvis, fevers to 100, cultures done, negative. Temperatures was 102 by 03-23-15 with shaking chills and she was admitted 03-23-15 thru 03-28-15,  improvement with drainage of pyometrium by Dr [name] on 03-25-15. Uterine and cervical biopsies 03-25-15 had necrotic and inflammatory tissue, no malignancy seen. Patient had no positive cultures from that hospitalization other than a few bacteroides fragilis from the pyometrium..      Objective:  Vital signs in last 24 hours:  BP 143/76 mmHg  Pulse 113   Temp(Src) 99.7 F (37.6 C) (Oral)  Resp 18  Ht 5' 4"  (1.626 m)  Wt 151 lb 6.4 oz (68.675 kg)  BMI 25.98 kg/m2  SpO2 100%  LMP 11/05/2011  Weight up 1 lb  Alert, oriented and appropriate, talkative, detailed historian. Ambulatory without assistance. Sits leaning forward in chair and on exam table. Looks slightly uncomfortable but not in acute discomfort. Respirations not labored RA.  Alopecia  HEENT:PERRL, sclerae not icteric. Oral mucosa moist without lesions, posterior pharynx clear.  Neck supple. No JVD.  Lymphatics:no supraclavicular or inguinal adenopathy Resp: no wheezes or rales, clear to auscultation bilaterally and normal percussion bilaterally Cardio: regular rate and rhythm. No gallop. GI: soft, nontender, minimally distended, no mass or organomegaly. Normally active bowel sounds. Small non-inflamed external hemorrhoidal tags.  Musculoskeletal/ Extremities: LE without pitting edema, cords, tenderness. RIght mid buttock not tender to palpation,, no mass.  Neuro: peripheral neuropathy feet > hands. Otherwise nonfocal. PSYCH appropriate mood and affect Skin without rash including right buttock, no ecchymosis, petechiae   Lab Results:  Results for orders placed or performed in visit on 04/18/15  Urine Culture  Result Value Ref Range   Urine Culture, Routine Culture, Urine   CBC with Differential  Result Value Ref Range   WBC 10.4 (H) 3.9 - 10.3 10e3/uL   NEUT# 8.7 (H) 1.5 - 6.5 10e3/uL   HGB 10.3 (L) 11.6 - 15.9 g/dL   HCT 31.0 (L) 34.8 - 46.6 %   Platelets 334 145 - 400 10e3/uL   MCV 100.7 79.5 - 101.0 fL   MCH 33.3 25.1 - 34.0 pg   MCHC 33.1 31.5 - 36.0 g/dL   RBC 3.08 (L) 3.70 - 5.45 10e6/uL  RDW 17.1 (H) 11.2 - 14.5 %   lymph# 0.6 (L) 0.9 - 3.3 10e3/uL   MONO# 0.8 0.1 - 0.9 10e3/uL   Eosinophils Absolute 0.1 0.0 - 0.5 10e3/uL   Basophils Absolute 0.1 0.0 - 0.1 10e3/uL   NEUT% 83.5 (H) 38.4 - 76.8 %   LYMPH% 6.2 (L) 14.0 - 49.7 %   MONO% 7.8 0.0 - 14.0 %    EOS% 1.4 0.0 - 7.0 %   BASO% 1.1 0.0 - 2.0 %  Comprehensive metabolic panel (Cmet) - [facility]  Result Value Ref Range   Sodium 135 (L) 136 - 145 mEq/L   Potassium 3.9 3.5 - 5.1 mEq/L   Chloride 98 98 - 109 mEq/L   CO2 24 22 - 29 mEq/L   Glucose 114 70 - 140 mg/dl   BUN 19.4 7.0 - 26.0 mg/dL   Creatinine 1.0 0.6 - 1.1 mg/dL   Total Bilirubin 0.34 0.20 - 1.20 mg/dL   Alkaline Phosphatase 88 40 - 150 U/L   AST 10 5 - 34 U/L   ALT 10 0 - 55 U/L   Total Protein 7.2 6.4 - 8.3 g/dL   Albumin 3.0 (L) 3.5 - 5.0 g/dL   Calcium 9.6 8.4 - 10.4 mg/dL   Anion Gap 13 (H) 3 - 11 mEq/L   EGFR 65 (L) >90 ml/min/1.73 m2  Urinalysis with microscopic - [facility]  Result Value Ref Range   Glucose Negative Negative mg/dL   Bilirubin (Urine) Negative Negative   Ketones Negative Negative mg/dL   Specific Gravity, Urine 1.030 1.003 - 1.035   Blood Large Negative   pH 6.0 4.6 - 8.0   Protein 100 Negative- <30 mg/dL   Urobilinogen, UR 0.2 0.2 - 1 mg/dL   Nitrite Negative Negative   Leukocyte Esterase Negative Negative   RBC / HPF 21-50 0 - 2   WBC, UA 7-10 0 - 2   Bacteria, UA Moderate Negative- Trace   Epithelial Cells Few Negative- Few    Urine culture and blood culture sent and  Pending  24 hour urine for total protein 04-08-15 had 377 mg/ 24 hours  Studies/Results:  No results found.  Medications: I have reviewed the patient's current medications. Will start amoxicillin 500 mg q 8 hrs + flagyl 500 mg q 8 hrs. Will try fentanyl patch at 12.5 mcg every 72 hrs. Tramadol prescription rewritten (as last expired). Miralax and/ or senokot daily - bid to keep bowels moving daily on increased pain medication.  DISCUSSION Discussed with Dr [name]. Will begin antibiotics including flagyl for what is likely recurrent pyometra. Will follow up cultures pending. Patient is to call if temperatures higher or shaking chills. Pain in right buttock likely pelvic nerve related, tho they will watch for rash; pain medication  as above, discussed at least 24 hrs after starting duragesic patch before noticeable. She will take ibuprofen with food ~ 30 min before going to bed, whether or not fever. She is not to try to work at least until she is seen next week. Appointment made with Dr [name] for 04-25-15.   Oral and written instructions as follows: Antibiotics for pyometra: Amoxicillin 500 mg every 8 hrs Flagyl (metronidazole) 500 mg every 8 hrs  Pain medicine Fentanyl (duragesic) patch 12.5 mcg every 72 hrs. It will take at least 24 hours for you to be able to tell any improvement after first patch applied Tramadol 50 mg every 8 hrs as needed for pain, in addition to the patch OK to  continue ibuprofen - sug gest taking this ~ an hour before bed this week whether or not fever. You need food on stomach with each dose of ibuprofen.  You will need to use miralax or senokotS (one capful or 2 tablets) 1-2x daily as the pain medicine will make you more constipated.  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 Will start flagyl + amoxicillin now, with gyn onc to see on 11-28.  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 Improvement with CDDP taxol avastin by CT 02-21-15 and stable by CT 03-23-15, cycle 6 delayed due to hospitalization, with CDDP taxol given 11-8 + neulasta, avastin held with elevated urine  protein.  Due cycle 7 on 11-30/ 12-1 if stable by then; note 24 hour urine protein ok to continue avastin. Plan  PET after cycle 7 if that is still appropriate given interim problems 3. iron deficiency anemia from blood loss and chemo anemia, oral iron 4.No central catheter at patient's  preference 5.previous hypoK and hypoMg related to CDDP, both good by labs now. No change to supplemental K and Mg with IVF for CDDP, follow. 6.intolerance morphine and codeine. OK with tramadol and fentanyl used for procedure 7.left shoulder possible rotator cuff tear, which she has not wanted to address with rest of situation 8.history of asthma, none x months 9.no flu vaccine yet due to fevers NEEDS WHEN POSSIBLE 10.chemo peripheral neuropathy especially in feet, persistent but no worse 11.History of urosepsis and C diff spring 2016. Would need C diff checked if diarrhea 12.post cholecystectomy    All questions answered, patient and sister in law in agreement with recommendations and plans. I will leave my appointment 11-28 tho can move that depending on situation when she sees gyn onc also on 11-28-- if not appropriate to treat 11-30/ 12-1, I will move MD and infusion apts.  Time spent 40 min incuding >50% counseling and coordination of care. LIVESAY,LENNIS P, MD   04/20/2015, 7:36 AM

## 2015-04-22 ENCOUNTER — Telehealth: Payer: Self-pay | Admitting: *Deleted

## 2015-04-22 NOTE — Telephone Encounter (Signed)
"   I received Amoxil and Flagyl last week and the amoxil was only a three day supply.  Was that all I am to take or did the pharmacy make a mistake?" Reviewed office note and orders.  What she received was what was ordered.  Thanked me. Denies any further questions.

## 2015-04-24 ENCOUNTER — Other Ambulatory Visit: Payer: Self-pay | Admitting: Oncology

## 2015-04-24 DIAGNOSIS — C539 Malignant neoplasm of cervix uteri, unspecified: Secondary | ICD-10-CM

## 2015-04-24 LAB — CULTURE, BLOOD (SINGLE)

## 2015-04-25 ENCOUNTER — Other Ambulatory Visit: Payer: BC Managed Care – PPO

## 2015-04-25 ENCOUNTER — Ambulatory Visit: Payer: BC Managed Care – PPO | Admitting: Oncology

## 2015-04-25 ENCOUNTER — Ambulatory Visit: Payer: BC Managed Care – PPO | Attending: Gynecologic Oncology | Admitting: Gynecologic Oncology

## 2015-04-25 ENCOUNTER — Telehealth: Payer: Self-pay | Admitting: Oncology

## 2015-04-25 ENCOUNTER — Encounter: Payer: Self-pay | Admitting: Gynecologic Oncology

## 2015-04-25 ENCOUNTER — Other Ambulatory Visit: Payer: Self-pay | Admitting: Oncology

## 2015-04-25 VITALS — BP 151/86 | HR 105 | Temp 98.1°F | Resp 19 | Ht 64.0 in | Wt 153.0 lb

## 2015-04-25 DIAGNOSIS — N719 Inflammatory disease of uterus, unspecified: Secondary | ICD-10-CM | POA: Diagnosis not present

## 2015-04-25 DIAGNOSIS — C539 Malignant neoplasm of cervix uteri, unspecified: Secondary | ICD-10-CM | POA: Insufficient documentation

## 2015-04-25 NOTE — Patient Instructions (Signed)
Plan to follow up with [name] as scheduled.  We will move your chemo to next week.  Please call for any questions or concerns.

## 2015-04-25 NOTE — Progress Notes (Signed)
Followup Patient Note: [name]  CC:  Chief Complaint  Patient presents with  . Cervical Cancer  . history of pyometrium followup  progressive cancer on imaging, discussion about test results.  Assessment/Plan:  [name]  is a 54 y.o.  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 S/p hospitalization for pyometrium (with evacuation) on 03/25/15. Responding to treatment based on CT findings.  S/p 6 cycles cis/taxol/avastin. Plan is for an additional 1 cycle. Will hold off for 1 week given profound fatigue.  No clinical signs of active infection.   If patient develops fevers >100.4 we would recommend she see me and have a repeat US to evaluate uterus for pyometrium.  Recommend repeating PET after cycle 7.  HPI: Stacy Webster is a 54 year old woman with a history of stage II B cervical adenocarcinoma. She was diagnosed with this condition in December of 2015. She was seen initially by Dr Genia Del in [place]. At the time of diagnosis she had positive lymph nodes in the pelvis on CT imaging. She was treated with primary chemo-radiation which was completed on April 8th. The patient reports completing the course of therapy on schedule with no delays.  She reports that when she completed therapy there was some concern by her treating physicians that her response had been incomplete. They had planned to wait until she was further out from therapy before obtaining a PET scan to evaluate for response.   In May, 2016 she developed febrile morbidity and manifestionas of sepsis. She presented to the [facility] ER and was admitted and noted to have a urinary tract infection and right ureteral obstruction which was new. She was treated with pressor support, IV  antibiotics, and a right perinephric drain for the right perinephric abscess.   On June 9th, 2016 she underwent PET/CT to evaluate for recurrent disease as the cause of her new right ureteral obstruction (CT had not clearly shown recurrence/progression). It demonstrated:  There is irregular soft tissue thickening along the posterior wall of the cecum which is hypermetabolic. SUV max is 6.8. This is worrisome for tumor infiltration. There are also metabolically active small mesenteric lymph nodes near the right colon (largest node measures 7 mm). The uterus remains enlarged with marked dilatation of the endometrial canal which is filled with fluid. This is likely due to cervical obstruction from the patient's cervical cancer. Several foci of hypermetabolism are noted in the myometrium. Could not exclude tumor implants. This could be residual tumor or radiation change. There are small pelvic sidewall lymph nodes but they are not hypermetabolic. There are borderline enlarged inguinal lymph nodes which are hypermetabolic.   She was started on salvage chemotherapy with cisplatin, paclitaxel and bevacizumab on 11/19/14.   CT scan of the chest abdomen and pelvis performed on 02/21/2015 revealed no new lesions, mild prominence and enhancement of the cervix consistent with treatment effect, small residual ileocolic and right abdominal mesenteric lymph nodes measuring 4-6 mm mildly improved. No distant metastasis. Right renal atrophy with resolving subcapsular seroma and hematoma.  Interval History: She has completed 6 cycles of cddp/taxol/bev. She is tolerating this well. She is working. She is extremely fatigued.  She was admitted to [facility] hospital with sepsis between 03/23/15 and 03/27/15 and diagnosed with a pyometrium which we evacuated with D&C in the OR (biopsies  of cervix were benign). After discharge she received cycle 6 of therapy (without avastin), and then continued to have fevers. She was treated  with a course of amoxicillin and flagyl.   She has had no fevers >100.4 in the past week and has had decreasing drainage and no abdominal pain.  Fatigue is her dominant symptom.  Current Meds:  Outpatient Encounter Prescriptions as of 04/25/2015  Medication Sig  . acetaminophen (TYLENOL) 500 MG tablet Take 1,000 mg by mouth 2 (two) times daily as needed for moderate pain, fever or headache.  . dexamethasone (DECADRON) 4 MG tablet Take 5 tablets with food 12 hrs and 6 hrs prior to Taxol Chemotherapy  . docusate sodium (COLACE) 100 MG capsule Take 100 mg by mouth daily as needed for mild constipation.   . fentaNYL (DURAGESIC - DOSED MCG/HR) 12 MCG/HR Place 1 patch (12.5 mcg total) onto the skin every 3 (three) days.  Marland Kitchen HEMOCYTE 324 (106 FE) MG TABS Take 325 mg by mouth daily. Take 1 tablet daily on empty stomach with Vitamin C tablet  . ibuprofen (ADVIL,MOTRIN) 200 MG tablet Take 400 mg by mouth every 6 (six) hours as needed.  . lactose free nutrition (BOOST PLUS) LIQD Take 237 mLs by mouth 3 (three) times daily with meals. (Patient taking differently: Take 237 mLs by mouth daily. )  . loratadine (CLARITIN) 10 MG tablet Take 10 mg by mouth daily as needed for allergies.   Marland Kitchen LORazepam (ATIVAN) 1 MG tablet Place 1/2 - 1 tablet under the tongue or swallow evry 4-6 hrs as needed for nausea  . Magnesium Oxide 500 MG CAPS Take 500 mg by mouth daily.  . metroNIDAZOLE (FLAGYL) 500 MG tablet Take 1 tablet (500 mg total) by mouth every 8 (eight) hours.  . pantoprazole (PROTONIX) 40 MG tablet Take 1 tablet (40 mg total) by mouth daily.  . potassium chloride (K-DUR,KLOR-CON) 10 MEQ tablet Take 10 mEq by mouth daily.  . vitamin C (ASCORBIC ACID) 500 MG tablet Take 500 mg by mouth daily.  Marland Kitchen albuterol (PROVENTIL HFA;VENTOLIN HFA) 108 (90 BASE) MCG/ACT inhaler Inhale 2 puffs into the lungs every 6 (six) hours as needed for wheezing or shortness of breath.  . diphenoxylate-atropine (LOMOTIL) 2.5-0.025 MG per  tablet Take 1 tablet by mouth as needed for diarrhea or loose stools. Can take up to 8 times daily  . ondansetron (ZOFRAN-ODT) 8 MG disintegrating tablet Take 1 tablet (8 mg total) by mouth every 8 (eight) hours as needed for nausea or vomiting. (Patient not taking: Reported on 04/04/2015)  . traMADol (ULTRAM) 50 MG tablet Take 1-2 tablets (50-100 mg total) by mouth every 8 (eight) hours. (Patient not taking: Reported on 04/25/2015)  . [DISCONTINUED] amoxicillin (AMOXIL) 500 MG tablet Take 1 tablet (500 mg total) by mouth every 8 (eight) hours.  . [DISCONTINUED] phenazopyridine (PYRIDIUM) 200 MG tablet take 1 tablet by mouth three times a day FOR BURNING   Facility-Administered Encounter Medications as of 04/25/2015  Medication  . dextrose 5 % and 0.9% NaCl 1,000 mL with potassium chloride 20 mEq, magnesium sulfate 12 mEq, mannitol 12.5 g infusion  . dextrose 5 % and 0.9% NaCl 1,000 mL with potassium chloride 20 mEq, magnesium sulfate 12 mEq, mannitol 12.5 g infusion    Allergy:  Allergies  Allergen Reactions  . Morphine And Related Rash    Per pt, morphine also makes her vomit  . Codeine Nausea Only  . Hydrocodone Nausea And Vomiting  . Peach [Prunus Persica] Swelling  and Rash    Social Hx:   Social History   Social History  . Marital Status: Married    Spouse Name: N/A  . Number of Children: N/A  . Years of Education: N/A   Occupational History  . Not on file.   Social History Main Topics  . Smoking status: Never Smoker   . Smokeless tobacco: Never Used  . Alcohol Use: No  . Drug Use: No  . Sexual Activity: Not on file   Other Topics Concern  . Not on file   Social History Narrative    Past Surgical Hx:  Past Surgical History  Procedure Laterality Date  . Cystoscopy w/ ureteral stent placement Right 10/13/2014    Procedure: CYSTOSCOPY WITH RETROGRADE PYELOGRAM/URETERAL STENT PLACEMENT;  Surgeon: Festus Aloe, MD;  Location: [facility];  Service: Urology;   Laterality: Right;  . Transthoracic echocardiogram  10-13-2014    mild LVH,  ef 55-60%/  mild TR  . Tonsillectomy and adenoidectomy  age 44    age 40--  redo-- Adenoidectomy  . Laparoscopic cholecystectomy  1990's  . Cystoscopy with ureteroscopy and stent placement Right 02/18/2015    Procedure: CYSTO WITH RIGHT URETERAL STENT EXCHANGE, RIGHT URETEROSCOPY ;  Surgeon: Festus Aloe, MD;  Location: [facility];  Service: Urology;  Laterality: Right;  . Cystoscopy with biopsy N/A 02/18/2015    Procedure: CYSTOSCOPY WITH  BLADDER  BIOPSY;  Surgeon: Festus Aloe, MD;  Location: [facility];  Service: Urology;  Laterality: N/A;  . Cystoscopy w/ retrogrades Right 02/18/2015    Procedure: CYSTOSCOPY WITH RETROGRADE PYELOGRAM;  Surgeon: Festus Aloe, MD;  Location: [facility];  Service: Urology;  Laterality: Right;  . Dilation and curettage of uterus N/A 03/25/2015    Procedure: DILATATION AND CURETTAGE WITH ULTRASOUND;  Surgeon: Everitt Amber, MD;  Location: [facility];  Service: Gynecology;  Laterality: N/A;    Past Medical Hx:  Past Medical History  Diagnosis Date  . Hydronephrosis, right   . Ureteral stricture, right   . History of septic shock     05/ 2016  due to Peptostriptococcus/  right hydronephrosis and obstruction----  resolved  . 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 Mild intermittent asthma   . History of Clostridium difficile     10-15-2014  resolved  . Left rotator cuff tear arthropathy   . White coat syndrome without hypertension   . GERD (gastroesophageal reflux disease)   . Frequency of urination   . Anemia associated with chemotherapy     Antineoplastic chemo  . Recurrent cervical cancer [facility]) oncologist-  dr Rimsha Trembley/ dr Marko Plume    dx 12/ 2015---  Radiation and Chemo (07-06-2014 to 09-03-2014)  now recurrent ,  Stage IIB  w/ Mets to pelvic/ retroperitoneum---  CHEMOTHERAPY CURRENTLY  . 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 Cervical cancer [facility])     Past Gynecological History:  Cervical cancer 2015. Preceding this the patient has no prior history of abnormal pap smears, and had been up to date with cervical cancer screening.  Patient's last menstrual period was 11/05/2011.  Family Hx: History reviewed. No pertinent family history.  Review of Systems:  Constitutional  Feels well,  fatigued  ENT Normal appearing ears and nares bilaterally Skin/Breast  No rash, sores, jaundice, itching, dryness Cardiovascular  No chest pain, shortness of breath, or edema  Pulmonary  No cough or wheeze.  Gastro Intestinal  No nausea, vomitting, or  diarrhoea. No bright red blood per rectum, no abdominal pain, change in bowel movement, or constipation.  Genito Urinary  No frequency, urgency, dysuria,  see HPIMusculo Skeletal  No myalgia, arthralgia, joint swelling or pain  Neurologic  No weakness, numbness, change in gait,  Psychology  No depression, anxiety, insomnia.   Vitals:  Blood pressure 151/86, pulse 105, temperature 98.1 F (36.7 C), resp. rate 19, height 5\' 4"  (1.626 m), weight 153 lb (69.4 kg), last menstrual period 11/05/2011, SpO2 100 %.  Physical Exam: General: no distress Pulm: CTAB CVS: RRR, no murmur Abd: soft, nondistended, nonpainful Lymph: no cervical or inguinal adenopathy Back : no cva tenderness Genitourinary: Normal BUS. Vagina with agglutination. Cervix grossly normal in appearance but hard and nodular. No apparent new tumor. Rectovaginal exam: no palpable tumor in pelvis or nodularity.   Donaciano Eva, MD  04/25/2015, 3:22 PM

## 2015-04-25 NOTE — Telephone Encounter (Signed)
Gave patient avs report and new appointments for 11/28 pof sent by LL after patient appointment with Dr. [name] today.

## 2015-04-27 ENCOUNTER — Ambulatory Visit: Payer: BC Managed Care – PPO

## 2015-04-28 ENCOUNTER — Ambulatory Visit: Payer: BC Managed Care – PPO

## 2015-05-01 ENCOUNTER — Other Ambulatory Visit: Payer: Self-pay | Admitting: Oncology

## 2015-05-01 DIAGNOSIS — C539 Malignant neoplasm of cervix uteri, unspecified: Secondary | ICD-10-CM

## 2015-05-02 ENCOUNTER — Other Ambulatory Visit: Payer: Self-pay

## 2015-05-02 ENCOUNTER — Ambulatory Visit (HOSPITAL_BASED_OUTPATIENT_CLINIC_OR_DEPARTMENT_OTHER): Payer: BC Managed Care – PPO | Admitting: Oncology

## 2015-05-02 ENCOUNTER — Encounter: Payer: Self-pay | Admitting: Oncology

## 2015-05-02 ENCOUNTER — Other Ambulatory Visit (HOSPITAL_BASED_OUTPATIENT_CLINIC_OR_DEPARTMENT_OTHER): Payer: BC Managed Care – PPO

## 2015-05-02 VITALS — BP 136/82 | HR 99 | Temp 98.8°F | Resp 18 | Ht 64.0 in | Wt 150.7 lb

## 2015-05-02 DIAGNOSIS — R509 Fever, unspecified: Secondary | ICD-10-CM | POA: Diagnosis not present

## 2015-05-02 DIAGNOSIS — C530 Malignant neoplasm of endocervix: Secondary | ICD-10-CM | POA: Diagnosis not present

## 2015-05-02 DIAGNOSIS — D500 Iron deficiency anemia secondary to blood loss (chronic): Secondary | ICD-10-CM

## 2015-05-02 DIAGNOSIS — C7989 Secondary malignant neoplasm of other specified sites: Secondary | ICD-10-CM

## 2015-05-02 DIAGNOSIS — C539 Malignant neoplasm of cervix uteri, unspecified: Secondary | ICD-10-CM

## 2015-05-02 DIAGNOSIS — T451X5A Adverse effect of antineoplastic and immunosuppressive drugs, initial encounter: Secondary | ICD-10-CM

## 2015-05-02 DIAGNOSIS — N1330 Unspecified hydronephrosis: Secondary | ICD-10-CM | POA: Diagnosis not present

## 2015-05-02 DIAGNOSIS — N719 Inflammatory disease of uterus, unspecified: Secondary | ICD-10-CM

## 2015-05-02 DIAGNOSIS — R809 Proteinuria, unspecified: Secondary | ICD-10-CM

## 2015-05-02 DIAGNOSIS — G893 Neoplasm related pain (acute) (chronic): Secondary | ICD-10-CM

## 2015-05-02 DIAGNOSIS — D6481 Anemia due to antineoplastic chemotherapy: Secondary | ICD-10-CM

## 2015-05-02 LAB — COMPREHENSIVE METABOLIC PANEL
ALBUMIN: 3.1 g/dL — AB (ref 3.5–5.0)
ALK PHOS: 60 U/L (ref 40–150)
AST: 14 U/L (ref 5–34)
Anion Gap: 10 mEq/L (ref 3–11)
BUN: 18.6 mg/dL (ref 7.0–26.0)
CALCIUM: 9.4 mg/dL (ref 8.4–10.4)
CO2: 28 mEq/L (ref 22–29)
CREATININE: 1 mg/dL (ref 0.6–1.1)
Chloride: 98 mEq/L (ref 98–109)
EGFR: 65 mL/min/{1.73_m2} — ABNORMAL LOW (ref >90)
GLUCOSE: 103 mg/dL (ref 70–140)
Potassium: 4.5 mEq/L (ref 3.5–5.1)
Sodium: 135 mEq/L — ABNORMAL LOW (ref 136–145)
Total Bilirubin: <0.3 mg/dL (ref 0.20–1.20)
Total Protein: 7.4 g/dL (ref 6.4–8.3)

## 2015-05-02 LAB — CBC WITH DIFFERENTIAL/PLATELET
BASO%: 0.2 % (ref 0.0–2.0)
BASOS ABS: 0 10*3/uL (ref 0.0–0.1)
EOS ABS: 0.1 10*3/uL (ref 0.0–0.5)
EOS%: 0.9 % (ref 0.0–7.0)
HCT: 33 % — ABNORMAL LOW (ref 34.8–46.6)
HGB: 10.4 g/dL — ABNORMAL LOW (ref 11.6–15.9)
LYMPH%: 8.5 % — AB (ref 14.0–49.7)
MCH: 32.1 pg (ref 25.1–34.0)
MCHC: 31.5 g/dL (ref 31.5–36.0)
MCV: 101.9 fL — AB (ref 79.5–101.0)
MONO#: 1 10*3/uL — ABNORMAL HIGH (ref 0.1–0.9)
MONO%: 10.5 % (ref 0.0–14.0)
NEUT%: 79.9 % — ABNORMAL HIGH (ref 38.4–76.8)
NEUTROS ABS: 7.6 10*3/uL — AB (ref 1.5–6.5)
Platelets: 474 10*3/uL — ABNORMAL HIGH (ref 145–400)
RBC: 3.24 10*6/uL — AB (ref 3.70–5.45)
RDW: 17.1 % — AB (ref 11.2–14.5)
WBC: 9.5 10*3/uL (ref 3.9–10.3)
lymph#: 0.8 10*3/uL — ABNORMAL LOW (ref 0.9–3.3)

## 2015-05-02 LAB — MAGNESIUM: Magnesium: 1.9 mg/dl (ref 1.5–2.5)

## 2015-05-02 LAB — UA PROTEIN, DIPSTICK - CHCC: Protein, ur: 100 mg/dL

## 2015-05-02 MED ORDER — GABAPENTIN 100 MG PO CAPS: 100.0000 mg | ORAL_CAPSULE | Freq: Every day | ORAL | Status: DC

## 2015-05-02 NOTE — Progress Notes (Signed)
OFFICE PROGRESS NOTE   May 02, 2015   Physicians:Emma Carita Pian, MD, Michel Bickers, Leonidas Romberg, Genia Del), Marguerita Merles, Janyth Pupa  INTERVAL HISTORY:   Patient is seen, together with sister in law, in continuing attention to treatment in process for recurrent adenocarcinoma of cervix, with restaging planned after upcoming cycle of CDDP taxol avastin. Recent course has been complicated by recurrent symptomatic pyometria, which has clinically responded to 10 days of Flagyl begun 04-18-15. Cycle 7 was delayed with this problem, now rescheduled to 12-7 and 05-05-15.  Patient is feeling much better for past few days, returned to work today. She has had no fever since ~ 04-18-15, no vaginal drainage or foul odor, and appetite is better. She stopped duragesic after using last patch on 04-30-15. The pain in right buttock is essentially resolved, however she has ongoing pain in vagina that is new in past several weeks. Tramadol did not help when tried on 05-01-15. She is sleeping poorly, wakens ~ 0100 and sleeps very little after that. Ativan at hs does not cover to early AM. She has no bleeding, no LE swelling, is voiding ok, does not seem symptomatic now from right stent. Bowels are moving. She denies increased SOB or any chest discomfort. Peripheral neuropathy in feet not more bothersome. Remainder of 10 point Review of Systems negative.     Refused flu vaccine today. Patient reports no problems with flu vaccines until 3 years in a row when had nausea after vaccines.  No central line Right ureteral stent   ONCOLOGIC HISTORY Patient had been in usual good health, with no previous abnormal PAP smears, when she developed frequent urination and vaginal bleeding in late 2015. She had endometrial biopsy by Dr Janyth Pupa [phone]) with adenocarcinoma. PET in [facility] system 11-17-27 had hypermetabolic uptake within the uterine cervix and lower uterine segment and metastatic  adenopathy within the bilateral pelvis and periaortic retroperitoneum. Dr Genia Del and Dr Marguerita Merles treated with pelvic and para-aortic radiation given with 6 cycles of sensitizing CDDP, and HDR x5 from 07-06-14 thru 09-03-14. Patient had a difficult time with nausea, vomiting, diarrhea and hypokalemia/ hypomagnesemia thru treatment. Patient used ODT zofran and ativan during chemotherapy. She had a difficult time doing oral prehydration for CDDP. She had no peripheral neuropathy with the CDDP. There was concern by completion of the treatment that she did not have optimal response, with plan for repeat evaluation at 6 weeks from completion of the treatment. Prior to planned reevaluation, patient began having daily fevers for possibly 4 weeks, as high as 102 degrees x 2 weeks prior to 10-11-14; by presentation at ED she also had increased right flank pain. She had [facility] on 10-08-14, this compared with PET of 05-2014 showed increase in subcapsular fluid collection right kidney and right hydronephrosis, increase in size of endometrial canal/ uterus/ right adnexa, and improvement in retroperitoneal, common iliac and bilateral pelvic sidewall adenopathy. She was admitted to [facility] ICU 5-16/ 10-12-14 with sepsis from peptostreptococcus. She had percutaneous drainage by IR of 635 cc dark thin fluid from right perinephric area on 10-12-14, with perinephric drain removed by urology on 10-18-14. She had right ureteral stent placement by Dr Junious Silk on 10-13-14. She developed C.difficile diarrhea, begun on flagyl 10-15-14 x 2 weeks, also on ceftin during that time. She saw Dr [name] in consultation on 11-05-14, with cervix grossly normal appearance, firm to palpation with right anterior extension, and purulent material from cervical os with manipulation, no  palpable adnexal masses, induration R>L parametrium. PET 06-06-39 had hypermetabolic tissue thickening at cecum, + small right sided  mesenteric and inguinal nodes, foci in myometriium and in region of cervix, small right perinephric fluid collection and dilated fluid filled endometrium. Dr [name] saw her following the PET, with recommendation for systemic treatment of the metastatic disease with CDDP, taxol and avastin. First CDDP Taxol 11-19-14, neutropenic day 14. Repeat CT showed essentially stable, minimal right perinephric fluid on 12-06-14, prior to cycle 2 CDDP taxol 7-14 with first avastin 12-10-14 and neulasta. CT AP 02-21-15 after 4 cycles CDDP taxol/ 3 cycles avastin showed improvement. Cystoscopy, bladder biopsy with fulguration, right diagnostic ureteroscopy,right retrograde pyelogram, and right ureteral stent exchange by Dr Junious Silk on 02-18-15. 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 She was hospitalized with pyometria 02-2015, then recurrent symptoms treated outpatient 03-2015.    Objective:  Vital signs in last 24 hours:  BP 136/82 mmHg  Pulse 99  Temp(Src) 98.8 F (37.1 C) (Oral)  Resp 18  Ht 5' 4"  (1.626 m)  Wt 150 lb 11.2 oz (68.357 kg)  BMI 25.85 kg/m2  SpO2 99%  LMP 11/05/2011 Weight down 2 lbs. Alert, oriented and appropriate. Ambulatory without difficulty. Looks comfortable today, respirations not labored RA, talkative. Hair is growing back  HEENT:PERRL, sclerae not icteric. Oral mucosa a little dry without lesions, posterior pharynx clear.  Neck supple. No JVD.  Lymphatics:no cervical,surpaclavicular or inguinal adenopathy Resp: clear to auscultation bilaterally and normal percussion bilaterally Cardio: regular rate and rhythm. No gallop. GI: soft, nontender, not distended, no mass or organomegaly. Some bowel sounds.  Musculoskeletal/ Extremities: without pitting edema, cords, tenderness Neuro: no change  peripheral neuropathy in feet. Otherwise nonfocal. PSYCH appropriate mood and affect Skin without rash, ecchymosis, petechiae   Lab Results:  Results for orders placed or performed in visit on 05/02/15  CBC with Differential  Result Value Ref Range   WBC 9.5 3.9 - 10.3 10e3/uL   NEUT# 7.6 (H) 1.5 - 6.5 10e3/uL   HGB 10.4 (L) 11.6 - 15.9 g/dL   HCT 33.0 (L) 34.8 - 46.6 %   Platelets 474 (H) 145 - 400 10e3/uL   MCV 101.9 (H) 79.5 - 101.0 fL   MCH 32.1 25.1 - 34.0 pg   MCHC 31.5 31.5 - 36.0 g/dL   RBC 3.24 (L) 3.70 - 5.45 10e6/uL   RDW 17.1 (H) 11.2 - 14.5 %   lymph# 0.8 (L) 0.9 - 3.3 10e3/uL   MONO# 1.0 (H) 0.1 - 0.9 10e3/uL   Eosinophils Absolute 0.1 0.0 - 0.5 10e3/uL   Basophils Absolute 0.0 0.0 - 0.1 10e3/uL   NEUT% 79.9 (H) 38.4 - 76.8 %   LYMPH% 8.5 (L) 14.0 - 49.7 %   MONO% 10.5 0.0 - 14.0 %   EOS% 0.9 0.0 - 7.0 %   BASO% 0.2 0.0 - 2.0 %  Magnesium - [facility]  Result Value Ref Range   Magnesium 1.9 1.5 - 2.5 mg/dl  Urine protein by dipstick - [facility]  Result Value Ref Range   Protein, ur 100 Negative- <30 mg/dL  Comprehensive metabolic panel  Result Value Ref Range   Sodium 135 (L) 136 - 145 mEq/L   Potassium 4.5 3.5 - 5.1 mEq/L   Chloride 98 98 - 109 mEq/L   CO2 28 22 - 29 mEq/L   Glucose  103 70 - 140 mg/dl   BUN 18.6 7.0 - 26.0 mg/dL   Creatinine 1.0 0.6 - 1.1 mg/dL   Total Bilirubin <0.30 0.20 - 1.20 mg/dL   Alkaline Phosphatase 60 40 - 150 U/L   AST 14 5 - 34 U/L   ALT <9 0 - 55 U/L   Total Protein 7.4 6.4 - 8.3 g/dL   Albumin 3.1 (L) 3.5 - 5.0 g/dL   Calcium 9.4 8.4 - 10.4 mg/dL   Anion Gap 10 3 - 11 mEq/L   EGFR 65 (L) >90 ml/min/1.73 m2    Urine and blood cultures 04-18-15 returned negative  Studies/Results:  No results found.  Medications: I have reviewed the patient's current medications. OK to treat with avastin for upcoming cycle 7 based on 24 hour urine for total protein collected 04-08-15 (337 mg/24 hours). Add gabapentin 100 mg at hs for pain  and neuropathy, as this may also help her sleep. Can increase dose if not sufficiently helpful after a week or so.    DISCUSSION Clinically much better since 10 days flagyl for recurrent pyometria; she knows to call if symptoms recur. Will proceed with chemo avastin as planned 12-7 and 12-8, with IVF also on 12-8 and neulasta either at [facility] or by [facility] depending on timing of injection relative to chemo administration. Carbo skin test. Will request PET after this cycle 7, note recent CT AP 03-23-15  (done to evaluate fever) stable from cancer standpoint compared with scan of 01-2015, such that PET medically necessary to determine if additional treatment needed now for active disease or if close observation would be appropriate. She will have follow up with Dr [name] on 05-26-15, so should have the PET prior to that appointment.  I have explained that flu vaccine is killed virus and that nausea is not expected side effect. She does tolerate eggs in cooking. I have told her that influenza has already been documented in our area and that it would be very difficult for her to tolerate flu given active chemo. I have encouraged her to take flu vaccine at least week after chemo.   Assessment/Plan: 1. recurrent/ progressive adenocarcinoma of cervix: clinical IIB at diagnosis 04-2014 treated initially with radiation and sensitizing cisplatin, subsequent metastatic disease to pelvis and retroperitoneum, with right hydronephrosis. Improvement with CDDP taxol avastin by CT 02-21-15 and stable by CT 03-23-15, cycle 6 delayed due to hospitalization, with CDDP taxol given 11-8 + neulasta, avastin held with elevated urine protein. Cycle 7 delayed with pyometria, will be given 12-7 and 12-8 with neulasta and IVF; note 24 hour urine protein ok to continue avastin. Plan PET after cycle 7 and will see Dr [name] late Dec after that scan. Try gabapentin at hs for vaginal pain. 2.pyometria: treated in hospital 02-2015, with  recurrence mid Nov,  now clinically resolved following course of oral flagyl  3. iron deficiency anemia from blood loss and chemo anemia,continue oral iron, Hgb stable today 4.No central catheter at patient's preference 5.previous hypoK and hypoMg related to CDDP, both good by labs now. No change to supplemental K and Mg with IVF for CDDP, follow. 6.intolerance morphine and codeine. OK with tramadol and fentanyl used for procedure 7.left shoulder possible rotator cuff tear, which she has not wanted to address with rest of situation 8.history of asthma, none x months 9.Flu vaccine held Oct and Nov due to fevers, now refusing.  I have strongly encouraged her to have this ~ week after upcoming chemo. 10.chemo peripheral  neuropathy especially in feet, persistent but no worse. Gabapentin to be added at hs 11.History of urosepsis and C diff spring 2016. Would need C diff checked if diarrhea 12.post cholecystectomy   All questions answered. Patient and sister in law understand recommendations and plans and know to call if needed prior to scheduled appointments. Chemo, avastin, neulasta, IVF orders all comfirmed. Time spent 30 min including >50% counseling and coordination of care. Cc Drs Wynelle Cleveland, [place]   Gordy Levan, MD   05/02/2015, 4:49 PM

## 2015-05-03 ENCOUNTER — Telehealth: Payer: Self-pay | Admitting: Oncology

## 2015-05-03 NOTE — Telephone Encounter (Signed)
Per 12/5 pof pet mid/late December - lab/LL 1/26 and Dr. [name] late December or early January after pet. Message to LL re no pet order - message to gynonc via phone and EPIC re f/u with Dr. [name]. Once pet order is entered central radiology schedulers will contact patient re appointment. Left message for patient re lab/LL 1/26 and that she will here from gyonc and central re other appointments. Confirmed existing appointments for 12/7 and 12/8. Patient to get new schedule 12/7.

## 2015-05-04 ENCOUNTER — Telehealth: Payer: Self-pay | Admitting: Oncology

## 2015-05-04 ENCOUNTER — Encounter: Payer: Self-pay | Admitting: Nurse Practitioner

## 2015-05-04 ENCOUNTER — Other Ambulatory Visit: Payer: Self-pay | Admitting: Oncology

## 2015-05-04 ENCOUNTER — Ambulatory Visit (HOSPITAL_BASED_OUTPATIENT_CLINIC_OR_DEPARTMENT_OTHER): Payer: BC Managed Care – PPO

## 2015-05-04 ENCOUNTER — Telehealth: Payer: Self-pay | Admitting: Nurse Practitioner

## 2015-05-04 ENCOUNTER — Other Ambulatory Visit: Payer: Self-pay | Admitting: Nurse Practitioner

## 2015-05-04 ENCOUNTER — Ambulatory Visit (HOSPITAL_BASED_OUTPATIENT_CLINIC_OR_DEPARTMENT_OTHER): Payer: BC Managed Care – PPO | Admitting: Nurse Practitioner

## 2015-05-04 VITALS — BP 139/79 | HR 87 | Temp 98.9°F | Resp 18

## 2015-05-04 DIAGNOSIS — T7840XA Allergy, unspecified, initial encounter: Secondary | ICD-10-CM

## 2015-05-04 DIAGNOSIS — C530 Malignant neoplasm of endocervix: Secondary | ICD-10-CM

## 2015-05-04 DIAGNOSIS — Z5111 Encounter for antineoplastic chemotherapy: Secondary | ICD-10-CM | POA: Diagnosis not present

## 2015-05-04 DIAGNOSIS — C539 Malignant neoplasm of cervix uteri, unspecified: Secondary | ICD-10-CM

## 2015-05-04 MED ORDER — SODIUM CHLORIDE 0.9 % IV SOLN
Freq: Once | INTRAVENOUS | Status: AC
  Administered 2015-05-04: 11:00:00 via INTRAVENOUS
  Filled 2015-05-04: qty 5

## 2015-05-04 MED ORDER — SODIUM CHLORIDE 0.9 % IV SOLN
Freq: Once | INTRAVENOUS | Status: AC
  Administered 2015-05-04: 11:00:00 via INTRAVENOUS
  Filled 2015-05-04: qty 8

## 2015-05-04 MED ORDER — FAMOTIDINE IN NACL 20-0.9 MG/50ML-% IV SOLN
20.0000 mg | Freq: Once | INTRAVENOUS | Status: AC
  Administered 2015-05-04: 20 mg via INTRAVENOUS

## 2015-05-04 MED ORDER — LORAZEPAM 2 MG/ML IJ SOLN
0.5000 mg | Freq: Once | INTRAMUSCULAR | Status: AC
  Administered 2015-05-04: 0.5 mg via INTRAVENOUS

## 2015-05-04 MED ORDER — PACLITAXEL CHEMO INJECTION 300 MG/50ML
135.0000 mg/m2 | Freq: Once | INTRAVENOUS | Status: AC
  Administered 2015-05-04: 234 mg via INTRAVENOUS
  Filled 2015-05-04: qty 39

## 2015-05-04 MED ORDER — SODIUM CHLORIDE 0.9 % IV SOLN
40.0000 mg/m2 | Freq: Once | INTRAVENOUS | Status: AC
  Administered 2015-05-04: 70 mg via INTRAVENOUS
  Filled 2015-05-04: qty 70

## 2015-05-04 MED ORDER — DIPHENHYDRAMINE HCL 50 MG/ML IJ SOLN
INTRAMUSCULAR | Status: AC
  Filled 2015-05-04: qty 1

## 2015-05-04 MED ORDER — FAMOTIDINE IN NACL 20-0.9 MG/50ML-% IV SOLN
INTRAVENOUS | Status: AC
  Filled 2015-05-04: qty 50

## 2015-05-04 MED ORDER — LORAZEPAM 2 MG/ML IJ SOLN
INTRAMUSCULAR | Status: AC
  Filled 2015-05-04: qty 1

## 2015-05-04 MED ORDER — DIPHENHYDRAMINE HCL 50 MG/ML IJ SOLN
50.0000 mg | Freq: Once | INTRAMUSCULAR | Status: AC
  Administered 2015-05-04: 50 mg via INTRAVENOUS

## 2015-05-04 MED ORDER — POTASSIUM CHLORIDE 2 MEQ/ML IV SOLN
INTRAVENOUS | Status: DC
  Administered 2015-05-04: 08:00:00 via INTRAVENOUS
  Filled 2015-05-04: qty 1000

## 2015-05-04 MED ORDER — SODIUM CHLORIDE 0.9 % IV SOLN
Freq: Once | INTRAVENOUS | Status: AC
  Administered 2015-05-04: 08:00:00 via INTRAVENOUS

## 2015-05-04 NOTE — Progress Notes (Signed)
After second PIV initiated in opposite arm and Zofran resumed, called back over by pt for same type redness/irritation above PIV site.  Infusion stopped and Cyndee, NP notified once again.  Decision made that Zofran was the common denominator in each situation so pt marked as having allergy to Zofran.  Remainder of Zofran discarded per Selena Lesser, NP and remainder of premeds including Pepcid and Emend/Dec given.  Redness resolved once drug discontinued and pt has no further complaints at this time.  Will continue to monitor.  Cyndee, NP to notify Marko Plume of today's events and allergy.

## 2015-05-04 NOTE — Telephone Encounter (Signed)
per pof for pt to follow prev sch appts-nothing added

## 2015-05-04 NOTE — Patient Instructions (Signed)
[facility] Discharge Instructions for Patients Receiving Chemotherapy  Today you received the following chemotherapy agents Taxol/Cisplatin.  To help prevent nausea and vomiting after your treatment, we encourage you to take your nausea medication as directed.   If you develop nausea and vomiting that is not controlled by your nausea medication, call the clinic.   BELOW ARE SYMPTOMS THAT SHOULD BE REPORTED IMMEDIATELY:  *FEVER GREATER THAN 100.5 F  *CHILLS WITH OR WITHOUT FEVER  NAUSEA AND VOMITING THAT IS NOT CONTROLLED WITH YOUR NAUSEA MEDICATION  *UNUSUAL SHORTNESS OF BREATH  *UNUSUAL BRUISING OR BLEEDING  TENDERNESS IN MOUTH AND THROAT WITH OR WITHOUT PRESENCE OF ULCERS  *URINARY PROBLEMS  *BOWEL PROBLEMS  UNUSUAL RASH Items with * indicate a potential emergency and should be followed up as soon as possible.  Feel free to call the clinic you have any questions or concerns. The clinic phone number is [phone].  Please show the CHEMO ALERT CARD at check-in to the Emergency Department and triage nurse.    

## 2015-05-04 NOTE — Progress Notes (Signed)
SYMPTOM MANAGEMENT CLINIC   HPI: Stacy Webster 54 y.o. female diagnosed with cervical cancer.  Currently undergoing Taxol/cisplatin/Avastin/Neulasta chemotherapy regimen.   Patient presented to the [facility] today to receive cycle 7 of her Taxol/cisplatin/Avastin/Neulasta chemotherapy regimen.  Patient had received only a portion of the Zofran as a premedication intravenously; and developed some bright red erythema of her veins associated with the infusion.  She also experienced some mild pruritus at the site as well.  There were no hives or other hypersensitivity reactions whatsoever.  Zofran/all other infusions were held; and right arm peripheral IV was discontinued.  Restarted a another peripheral IV to the left forearm; and again attempted to complete the Zofran infusion is a premedication.  Once again-patient developed the same erythema to her veins.All further zofran discontinued; and zofran will be placed on pt's allergy list.   Pt received the rest of her premeds with no further difficulty.  Erythema to bilat arms at IV sites resolved; and pt able to move forward with planned chemo with no further difficulty.  Rash    Review of Systems  Skin: Positive for rash.    Past Medical History  Diagnosis Date  . Hydronephrosis, right   . Ureteral stricture, right   . History of septic shock     05/ 2016  due to Peptostriptococcus/  right hydronephrosis and obstruction----  resolved  . 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 Mild intermittent asthma   . History of Clostridium difficile     10-15-2014  resolved  . Left rotator cuff tear arthropathy   . White coat syndrome without hypertension   . GERD (gastroesophageal reflux disease)   . Frequency of urination   . Anemia associated with chemotherapy     Antineoplastic chemo  . Recurrent cervical cancer [facility]) oncologist-  dr Rossi/ dr Marko Plume    dx 12/ 2015---  Radiation and Chemo (07-06-2014 to  09-03-2014)  now recurrent ,  Stage IIB  w/ Mets to pelvic/ retroperitoneum---  CHEMOTHERAPY CURRENTLY  . 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 Cervical cancer [facility])     Past Surgical History  Procedure Laterality Date  . Cystoscopy w/ ureteral stent placement Right 10/13/2014    Procedure: CYSTOSCOPY WITH RETROGRADE PYELOGRAM/URETERAL STENT PLACEMENT;  Surgeon: Festus Aloe, MD;  Location: [facility];  Service: Urology;  Laterality: Right;  . Transthoracic echocardiogram  10-13-2014    mild LVH,  ef 55-60%/  mild TR  . Tonsillectomy and adenoidectomy  age 28    age 13--  [facility]-- Adenoidectomy  . Laparoscopic cholecystectomy  1990's  . Cystoscopy with ureteroscopy and stent placement Right 02/18/2015    Procedure: CYSTO WITH RIGHT URETERAL STENT EXCHANGE, RIGHT URETEROSCOPY ;  Surgeon: Festus Aloe, MD;  Location: [facility];  Service: Urology;  Laterality: Right;  . Cystoscopy with biopsy N/A 02/18/2015    Procedure: CYSTOSCOPY WITH  BLADDER  BIOPSY;  Surgeon: Festus Aloe, MD;  Location: [facility];  Service: Urology;  Laterality: N/A;  . Cystoscopy w/ retrogrades Right 02/18/2015    Procedure: CYSTOSCOPY WITH RETROGRADE PYELOGRAM;  Surgeon: Festus Aloe, MD;  Location: [facility];  Service: Urology;  Laterality: Right;  . 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    is allergic to morphine and related; zofran; codeine; hydrocodone; and peach.    Medication List       This list is accurate as of: 05/04/15  3:42 PM.  Always use your most recent med list.               acetaminophen 500 MG tablet  Commonly known as:  TYLENOL  Take 1,000 mg by mouth 2 (two) times daily as needed for moderate pain, fever or headache.     albuterol 108 (90 BASE) MCG/ACT inhaler  Commonly known as:  PROVENTIL HFA;VENTOLIN HFA  Inhale 2 puffs into the lungs every 6 (six) hours as needed for wheezing or shortness of breath.     dexamethasone 4 MG tablet  Commonly known as:  DECADRON  Take 5 tablets with food 12 hrs and 6 hrs prior to Taxol Chemotherapy     diphenoxylate-atropine 2.5-0.025 MG tablet  Commonly known as:  LOMOTIL  Take 1 tablet by mouth as needed for diarrhea or loose stools. Can take up to 8 times daily     docusate sodium 100 MG capsule  Commonly known as:  COLACE  Take 100 mg by mouth daily as needed for mild constipation.     fentaNYL 12 MCG/HR  Commonly known as:  DURAGESIC - dosed mcg/hr  Place 1 patch (12.5 mcg total) onto the skin every 3 (three) days.     gabapentin 100 MG capsule  Commonly known as:  NEURONTIN  Take 1 capsule (100 mg total) by mouth at bedtime. Or as directed for pain. Will make you drowsy.     HEMOCYTE 324 (106 FE) MG Tabs  Generic drug:  Ferrous Fumarate  Take 325 mg by mouth daily. Take 1 tablet daily on empty stomach with Vitamin C tablet     ibuprofen 200 MG tablet  Commonly known as:  ADVIL,MOTRIN  Take 400 mg by mouth every 6 (six) hours as needed.     lactose  free nutrition Liqd  Take 237 mLs by mouth 3 (three) times daily with meals.     loratadine 10 MG tablet  Commonly known as:  CLARITIN  Take 10 mg by mouth daily as needed for allergies.     LORazepam 1 MG tablet  Commonly known as:  ATIVAN  Place 1/2 - 1 tablet under the tongue or swallow evry 4-6 hrs as needed for nausea     Magnesium Oxide 500 MG Caps  Take 500 mg by mouth daily.     ondansetron 8 MG disintegrating tablet  Commonly known as:  ZOFRAN-ODT  Take 1 tablet (8 mg total) by mouth every 8 (eight) hours as needed for nausea or vomiting.     pantoprazole 40 MG tablet  Commonly known as:  PROTONIX  Take 1 tablet (40 mg total) by mouth daily.     potassium chloride 10 MEQ tablet  Commonly known as:  K-DUR,KLOR-CON  Take 10 mEq by mouth daily.     traMADol 50 MG tablet  Commonly known as:  ULTRAM  Take 1-2 tablets (50-100 mg total) by mouth every 8 (eight) hours.     vitamin C 500 MG tablet  Commonly known as:  ASCORBIC ACID  Take 500 mg by mouth daily.         PHYSICAL EXAMINATION  Oncology Vitals 05/04/2015 05/02/2015  Height - 163 cm  Weight - 68.357 kg  Weight (lbs) - 150 lbs 11 oz  BMI (kg/m2) - 25.87 kg/m2  Temp 98.9 98.8  Pulse 87 99  Resp 18 18  SpO2 100 99  BSA (m2) - 1.76 m2  Some encounter information is confidential and restricted. Go to Review Flowsheets activity to see all data.   BP Readings from Last 2 Encounters:  05/04/15 139/79  05/02/15 136/82    Physical Exam  Constitutional: She is oriented to person, place, and time and well-developed, well-nourished, and in no distress.  HENT:  Head: Normocephalic and atraumatic.  Eyes: Conjunctivae and EOM are normal. Pupils are equal, round, and reactive to light. Right eye exhibits no discharge. Left eye exhibits no discharge. No scleral icterus.  Neck: Normal range of motion.  Pulmonary/Chest: Effort normal. No stridor. No respiratory distress.  Musculoskeletal: Normal range of motion.  She exhibits no edema or tenderness.  Neurological: She is alert and oriented to person, place, and time.  Skin: Skin is warm and dry. No rash noted. There is erythema. No pallor.  Patient presented to the [facility] today to receive cycle 7 of her Taxol/cisplatin/Avastin/Neulasta chemotherapy regimen.  Patient had received only a portion of the Zofran as a premedication intravenously; and developed some bright red erythema of her veins associated with the infusion.  She also experienced some mild pruritus at the site as well.  There were no hives or other hypersensitivity reactions whatsoever.     Psychiatric: Affect normal.  Nursing note and vitals reviewed.   LABORATORY DATA:. Appointment on 05/02/2015  Component Date Value Ref Range Status  . WBC 05/02/2015 9.5  3.9 - 10.3 10e3/uL Final  . NEUT# 05/02/2015 7.6* 1.5 - 6.5 10e3/uL Final  . HGB 05/02/2015 10.4* 11.6 - 15.9 g/dL Final  . HCT 05/02/2015 33.0* 34.8 - 46.6 % Final  . Platelets 05/02/2015 474* 145 - 400 10e3/uL Final  . MCV 05/02/2015 101.9* 79.5 - 101.0 fL Final  . MCH 05/02/2015 32.1  25.1 - 34.0 pg Final  . MCHC 05/02/2015 31.5  31.5 - 36.0 g/dL Final  . RBC 05/02/2015 3.24* 3.70 - 5.45 10e6/uL Final  . RDW 05/02/2015 17.1* 11.2 - 14.5 % Final  . lymph# 05/02/2015 0.8* 0.9 - 3.3 10e3/uL Final  . MONO# 05/02/2015 1.0* 0.1 - 0.9 10e3/uL Final  . Eosinophils Absolute 05/02/2015 0.1  0.0 - 0.5 10e3/uL Final  . Basophils Absolute 05/02/2015 0.0  0.0 - 0.1 10e3/uL Final  . NEUT% 05/02/2015 79.9* 38.4 - 76.8 % Final  . LYMPH% 05/02/2015 8.5* 14.0 - 49.7 % Final  . MONO% 05/02/2015 10.5  0.0 - 14.0 % Final  . EOS% 05/02/2015 0.9  0.0 - 7.0 % Final  . BASO% 05/02/2015 0.2  0.0 - 2.0 % Final  . Magnesium 05/02/2015 1.9  1.5 - 2.5 mg/dl Final  . Protein, ur 05/02/2015 100  Negative- <30 mg/dL Final  . Sodium 05/02/2015 135* 136 - 145 mEq/L Final  . Potassium 05/02/2015 4.5  3.5 - 5.1 mEq/L Final  . Chloride 05/02/2015 98   98 - 109 mEq/L Final  . CO2 05/02/2015 28  22 -  29 mEq/L Final  . Glucose 05/02/2015 103  70 - 140 mg/dl Final   Glucose reference range is for nonfasting patients. Fasting glucose reference range is 70- 100.  Marland Kitchen BUN 05/02/2015 18.6  7.0 - 26.0 mg/dL Final  . Creatinine 05/02/2015 1.0  0.6 - 1.1 mg/dL Final  . Total Bilirubin 05/02/2015 <0.30  0.20 - 1.20 mg/dL Final  . Alkaline Phosphatase 05/02/2015 60  40 - 150 U/L Final  . AST 05/02/2015 14  5 - 34 U/L Final  . ALT 05/02/2015 <9  0 - 55 U/L Final  . Total Protein 05/02/2015 7.4  6.4 - 8.3 g/dL Final  . Albumin 05/02/2015 3.1* 3.5 - 5.0 g/dL Final  . Calcium 05/02/2015 9.4  8.4 - 10.4 mg/dL Final  . Anion Gap 05/02/2015 10  3 - 11 mEq/L Final  . EGFR 05/02/2015 65* >90 ml/min/1.73 m2 Final   eGFR is calculated using the CKD-EPI Creatinine Equation (2009)   Right arm:        RADIOGRAPHIC STUDIES: No results found.  ASSESSMENT/PLAN:    Cervical cancer [facility]) Patient presented to the [facility] today to receive cycle 7 of her Taxol/cisplatin/Avastin/Neulasta chemotherapy regimen.  Patient had received only a portion of the Zofran as a premedication intravenously; and developed some bright red erythema of her veins associated with the infusion.  She also experienced some mild pruritus at the site as well.  There were no hives or other hypersensitivity reactions whatsoever.  Zofran/all other infusions were held; and right arm peripheral IV was discontinued.  Restarted a another peripheral IV to the left forearm; and again attempted to complete the Zofran infusion is a premedication.  Once again-patient developed the same erythema to her veins.All further zofran discontinued; and zofran will be placed on pt's allergy list.   Pt received the rest of her premeds with no further difficulty.  Erythema to bilat arms at IV sites resolved; and pt able to move forward with planned chemo with no further difficulty.  Patient is scheduled to  return tomorrow, 05/05/2015 for day 2 of her chemotherapy regimen.  She will also receive additional IV fluid rehydration at that time as well.    Patient is scheduled for possible flu vaccine on 05/13/15.  Patient is scheduled to return for labs and a follow-up visit on 06/22/2014.    Hypersensitivity reaction Patient presented to the [facility] today to receive cycle 7 of her Taxol/cisplatin/Avastin/Neulasta chemotherapy regimen.  Patient had received only a portion of the Zofran as a premedication intravenously; and developed some bright red erythema of her veins associated with the infusion.  She also experienced some mild pruritus at the site as well.  There were no hives or other hypersensitivity reactions whatsoever.  Zofran/all other infusions were held; and right arm peripheral IV was discontinued.  Restarted a another peripheral IV to the left forearm; and again attempted to complete the Zofran infusion is a premedication.  Once again-patient developed the same erythema to her veins.All further zofran discontinued; and zofran will be placed on pt's allergy list.   Pt received the rest of her premeds with no further difficulty.  Erythema to bilat arms at IV sites resolved; and pt able to move forward with planned chemo with no further difficulty.     Patient stated understanding of all instructions; and was in agreement with this plan of care. The patient knows to call the clinic with any problems, questions or concerns.   Review/collaboration with [name] regarding all  aspects of patient's visit today.   Total time spent with patient was 25 minutes;  with greater than 75 percent of that time spent in face to face counseling regarding patient's symptoms,  and coordination of care and follow up.  Disclaimer:This dictation was prepared with Dragon/digital dictation along with [employer]. Any transcriptional errors that result from this process are  unintentional.  Drue Second, NP 05/04/2015

## 2015-05-04 NOTE — Assessment & Plan Note (Signed)
Patient presented to the [facility] today to receive cycle 7 of her Taxol/cisplatin/Avastin/Neulasta chemotherapy regimen.  Patient had received only a portion of the Zofran as a premedication intravenously; and developed some bright red erythema of her veins associated with the infusion.  She also experienced some mild pruritus at the site as well.  There were no hives or other hypersensitivity reactions whatsoever.  Zofran/all other infusions were held; and right arm peripheral IV was discontinued.  Restarted a another peripheral IV to the left forearm; and again attempted to complete the Zofran infusion is a premedication.  Once again-patient developed the same erythema to her veins.All further zofran discontinued; and zofran will be placed on pt's allergy list.   Pt received the rest of her premeds with no further difficulty.  Erythema to bilat arms at IV sites resolved; and pt able to move forward with planned chemo with no further difficulty.  Patient is scheduled to return tomorrow, 05/05/2015 for day 2 of her chemotherapy regimen.  She will also receive additional IV fluid rehydration at that time as well.    Patient is scheduled for possible flu vaccine on 05/13/15.  Patient is scheduled to return for labs and a follow-up visit on 06/22/2014.

## 2015-05-04 NOTE — Telephone Encounter (Signed)
Injection appointment made and ok per patient and it will be on her avs in chemo today

## 2015-05-04 NOTE — Progress Notes (Signed)
Patient had vein irritation with the IVP medications with mild erythema and tenderness at site at 1100 after Benadryl and Ativan.  Patient in no distress and reported tenderness resolved immediately. IV was removed with Jenny Reichmann NP at chair assessing patient. New IV started now in opposite arm and rash in right arm completely resolved. Patient remains alert and oriented with no pain or other symptoms.

## 2015-05-04 NOTE — Assessment & Plan Note (Signed)
Patient presented to the [facility] today to receive cycle 7 of her Taxol/cisplatin/Avastin/Neulasta chemotherapy regimen.  Patient had received only a portion of the Zofran as a premedication intravenously; and developed some bright red erythema of her veins associated with the infusion.  She also experienced some mild pruritus at the site as well.  There were no hives or other hypersensitivity reactions whatsoever.  Zofran/all other infusions were held; and right arm peripheral IV was discontinued.  Restarted a another peripheral IV to the left forearm; and again attempted to complete the Zofran infusion is a premedication.  Once again-patient developed the same erythema to her veins.All further zofran discontinued; and zofran will be placed on pt's allergy list.   Pt received the rest of her premeds with no further difficulty.  Erythema to bilat arms at IV sites resolved; and pt able to move forward with planned chemo with no further difficulty.

## 2015-05-05 ENCOUNTER — Other Ambulatory Visit: Payer: Self-pay | Admitting: *Deleted

## 2015-05-05 ENCOUNTER — Ambulatory Visit (HOSPITAL_BASED_OUTPATIENT_CLINIC_OR_DEPARTMENT_OTHER): Payer: BC Managed Care – PPO

## 2015-05-05 ENCOUNTER — Ambulatory Visit: Payer: BC Managed Care – PPO

## 2015-05-05 VITALS — BP 136/67 | HR 86 | Temp 97.9°F

## 2015-05-05 DIAGNOSIS — C530 Malignant neoplasm of endocervix: Secondary | ICD-10-CM | POA: Diagnosis not present

## 2015-05-05 DIAGNOSIS — D6481 Anemia due to antineoplastic chemotherapy: Secondary | ICD-10-CM

## 2015-05-05 DIAGNOSIS — C539 Malignant neoplasm of cervix uteri, unspecified: Secondary | ICD-10-CM

## 2015-05-05 DIAGNOSIS — Z5112 Encounter for antineoplastic immunotherapy: Secondary | ICD-10-CM | POA: Diagnosis not present

## 2015-05-05 MED ORDER — SODIUM CHLORIDE 0.9 % IV SOLN
INTRAVENOUS | Status: DC
  Administered 2015-05-05: 09:00:00 via INTRAVENOUS

## 2015-05-05 MED ORDER — SODIUM CHLORIDE 0.9 % IV SOLN
Freq: Once | INTRAVENOUS | Status: AC
  Administered 2015-05-05: 09:00:00 via INTRAVENOUS

## 2015-05-05 MED ORDER — BEVACIZUMAB CHEMO INJECTION 400 MG/16ML
14.7000 mg/kg | Freq: Once | INTRAVENOUS | Status: AC
  Administered 2015-05-05: 1000 mg via INTRAVENOUS
  Filled 2015-05-05: qty 40

## 2015-05-05 MED ORDER — PEGFILGRASTIM 6 MG/0.6ML ~~LOC~~ PSKT
6.0000 mg | PREFILLED_SYRINGE | Freq: Once | SUBCUTANEOUS | Status: AC
  Administered 2015-05-05: 6 mg via SUBCUTANEOUS
  Filled 2015-05-05: qty 0.6

## 2015-05-05 NOTE — Progress Notes (Signed)
Neulasta injection given by infusion nurse 

## 2015-05-05 NOTE — Patient Instructions (Signed)
[facility] Discharge Instructions for Patients Receiving Chemotherapy  Today you received the following chemotherapy agents Avastin.  To help prevent nausea and vomiting after your treatment, we encourage you to take your nausea medication as prescribed.   If you develop nausea and vomiting that is not controlled by your nausea medication, call the clinic.   BELOW ARE SYMPTOMS THAT SHOULD BE REPORTED IMMEDIATELY:  *FEVER GREATER THAN 100.5 F  *CHILLS WITH OR WITHOUT FEVER  NAUSEA AND VOMITING THAT IS NOT CONTROLLED WITH YOUR NAUSEA MEDICATION  *UNUSUAL SHORTNESS OF BREATH  *UNUSUAL BRUISING OR BLEEDING  TENDERNESS IN MOUTH AND THROAT WITH OR WITHOUT PRESENCE OF ULCERS  *URINARY PROBLEMS  *BOWEL PROBLEMS  UNUSUAL RASH Items with * indicate a potential emergency and should be followed up as soon as possible.  Feel free to call the clinic you have any questions or concerns. The clinic phone number is [phone].  Please show the [facility] at check-in to the Emergency Department and triage nurse.  Dehydration, Adult Dehydration is a condition in which you do not have enough fluid or water in your body. It happens when you take in less fluid than you lose. Vital organs such as the kidneys, brain, and heart cannot function without a proper amount of fluids. Any loss of fluids from the body can cause dehydration.  Dehydration can range from mild to severe. This condition should be treated right away to help prevent it from becoming severe. CAUSES  This condition may be caused by:  Vomiting.  Diarrhea.  Excessive sweating, such as when exercising in hot or humid weather.  Not drinking enough fluid during strenuous exercise or during an illness.  Excessive urine output.  Fever.  Certain medicines. RISK FACTORS This condition is more likely to develop in:  People who are taking certain medicines that cause the body to lose excess fluid  (diuretics).   People who have a chronic illness, such as diabetes, that may increase urination.  Older adults.   People who live at high altitudes.   People who participate in endurance sports.  SYMPTOMS  Mild Dehydration  Thirst.  Dry lips.  Slightly dry mouth.  Dry, warm skin. Moderate Dehydration  Very dry mouth.   Muscle cramps.   Dark urine and decreased urine production.   Decreased tear production.   Headache.   Light-headedness, especially when you stand up from a sitting position.  Severe Dehydration  Changes in skin.   Cold and clammy skin.   Skin does not spring back quickly when lightly pinched and released.   Changes in body fluids.   Extreme thirst.   No tears.   Not able to sweat when body temperature is high, such as in hot weather.   Minimal urine production.   Changes in vital signs.   Rapid, weak pulse (more than 100 beats per minute when you are sitting still).   Rapid breathing.   Low blood pressure.   Other changes.   Sunken eyes.   Cold hands and feet.   Confusion.  Lethargy and difficulty being awakened.  Fainting (syncope).   Short-term weight loss.   Unconsciousness. DIAGNOSIS  This condition may be diagnosed based on your symptoms. You may also have tests to determine how severe your dehydration is. These tests may include:   Urine tests.   Blood tests.  TREATMENT  Treatment for this condition depends on the severity. Mild or moderate dehydration can often be treated at home. Treatment  should be started right away. Do not wait until dehydration becomes severe. Severe dehydration needs to be treated at the hospital. Treatment for Mild Dehydration  Drinking plenty of water to replace the fluid you have lost.   Replacing minerals in your blood (electrolytes) that you may have lost.  Treatment for Moderate Dehydration  Consuming oral rehydration solution (ORS). Treatment  for Severe Dehydration  Receiving fluid through an IV tube.   Receiving electrolyte solution through a feeding tube that is passed through your nose and into your stomach (nasogastric tube or NG tube).  Correcting any abnormalities in electrolytes. HOME CARE INSTRUCTIONS   Drink enough fluid to keep your urine clear or pale yellow.   Drink water or fluid slowly by taking small sips. You can also try sucking on ice cubes.  Have food or beverages that contain electrolytes. Examples include bananas and sports drinks.  Take over-the-counter and prescription medicines only as told by your health care provider.   Prepare ORS according to the manufacturer's instructions. Take sips of ORS every 5 minutes until your urine returns to normal.  If you have vomiting or diarrhea, continue to try to drink water, ORS, or both.   If you have diarrhea, avoid:   Beverages that contain caffeine.   Fruit juice.   Milk.   Carbonated soft drinks.  Do not take salt tablets. This can lead to the condition of having too much sodium in your body (hypernatremia).  SEEK MEDICAL CARE IF:  You cannot eat or drink without vomiting.  You have had moderate diarrhea during a period of more than 24 hours.  You have a fever. SEEK IMMEDIATE MEDICAL CARE IF:   You have extreme thirst.  You have severe diarrhea.  You have not urinated in 6-8 hours, or you have urinated only a small amount of very dark urine.  You have shriveled skin.  You are dizzy, confused, or both.   This information is not intended to replace advice given to you by your health care provider. Make sure you discuss any questions you have with your health care provider.   Document Released: 05/14/2005 Document Revised: 02/02/2015 Document Reviewed: 09/29/2014 Elsevier Interactive Patient Education [employer].

## 2015-05-11 ENCOUNTER — Other Ambulatory Visit: Payer: Self-pay | Admitting: Oncology

## 2015-05-11 DIAGNOSIS — C539 Malignant neoplasm of cervix uteri, unspecified: Secondary | ICD-10-CM

## 2015-05-11 MED ORDER — LORAZEPAM 1 MG PO TABS: ORAL_TABLET | ORAL | Status: DC

## 2015-05-13 ENCOUNTER — Ambulatory Visit: Payer: BC Managed Care – PPO

## 2015-05-13 ENCOUNTER — Telehealth: Payer: Self-pay | Admitting: Oncology

## 2015-05-13 NOTE — Telephone Encounter (Signed)
Patient called in and left a message to cancel todays flu inj as she has a meeting

## 2015-05-18 ENCOUNTER — Other Ambulatory Visit: Payer: Self-pay

## 2015-05-18 DIAGNOSIS — C539 Malignant neoplasm of cervix uteri, unspecified: Secondary | ICD-10-CM

## 2015-05-18 MED ORDER — TRAMADOL HCL 50 MG PO TABS: 50.0000 mg | ORAL_TABLET | Freq: Three times a day (TID) | ORAL | Status: DC

## 2015-05-18 NOTE — Telephone Encounter (Signed)
Refill request for Ultram , refill request approved by Joylene John ,APNP

## 2015-05-19 ENCOUNTER — Ambulatory Visit (HOSPITAL_COMMUNITY): Payer: BC Managed Care – PPO

## 2015-05-19 ENCOUNTER — Telehealth: Payer: Self-pay | Admitting: Gynecologic Oncology

## 2015-05-19 DIAGNOSIS — C539 Malignant neoplasm of cervix uteri, unspecified: Secondary | ICD-10-CM

## 2015-05-19 MED ORDER — METRONIDAZOLE 500 MG PO TABS: 500.0000 mg | ORAL_TABLET | Freq: Three times a day (TID) | ORAL | Status: DC

## 2015-05-19 NOTE — Telephone Encounter (Signed)
Pt called this am with complaints of thick, green discharge vaginally again.  She is asking if you think she needs an abx again.  Last seen on 04/25/15.  Returned call to patient and advised her of Dr. Serita Grit recommendations: no abx unless she gets febrile.  She will continue to make this yucky discharge because its due to dead cancer. There's no way to make that go away. However so long as it is coming out of her (not staying inside of the uterus) that is good as it will be less likely to get infected.

## 2015-05-19 NOTE — Addendum Note (Signed)
Addended by: Elizebeth Koller on: 05/19/2015 04:44 PM   Modules accepted: Orders

## 2015-05-19 NOTE — Telephone Encounter (Signed)
Patient contacted with Dr Everitt Amber recommendations for Flagyl 500 MG PO TID for 5 days for low grade temperature with green vaginal discharge. Patient states understanding , denies further questions at this time , will call with additional changes or concerns.

## 2015-05-26 ENCOUNTER — Ambulatory Visit: Payer: BC Managed Care – PPO | Attending: Gynecologic Oncology | Admitting: Gynecologic Oncology

## 2015-05-26 ENCOUNTER — Encounter: Payer: Self-pay | Admitting: Gynecologic Oncology

## 2015-05-26 ENCOUNTER — Ambulatory Visit: Payer: BC Managed Care – PPO | Admitting: Gynecologic Oncology

## 2015-05-26 VITALS — BP 127/75 | HR 90 | Temp 98.3°F | Resp 18 | Ht 64.0 in | Wt 147.3 lb

## 2015-05-26 DIAGNOSIS — C530 Malignant neoplasm of endocervix: Secondary | ICD-10-CM | POA: Insufficient documentation

## 2015-05-26 DIAGNOSIS — N898 Other specified noninflammatory disorders of vagina: Secondary | ICD-10-CM

## 2015-05-26 NOTE — Progress Notes (Signed)
Followup Patient Note: [name]  CC:  Chief Complaint  Patient presents with  . Cervical Cancer    F/U  vaginal discharge  Assessment/Plan:  [name]  is a 54 y.o.  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 S/p hospitalization for pyometrium (with evacuation) on 03/25/15. Responding to treatment based on CT findings.  S/p 7 cycles cis/taxol/avastin. Plan for restaging CT chest/abdo/pelvis (PET denied on insurance). If NED on PET, recommend holding off additional therapy at this point.  No clinical signs of active infection however I believe discharge is secondary to necrotic vaginal tissue and synechia and agglutination of upper vagina.  If patient develops fevers >100.4 we would recommend she see me and have a repeat US to evaluate uterus for pyometrium.  Recommended A&D ointment as barrier for excoriated labia secondary to persistent vaginal discharge.   HPI: Stacy Webster is a 54 year old woman with a history of stage II B cervical adenocarcinoma. She was diagnosed with this condition in December of 2015. She was seen initially by Dr Genia Del in [place]. At the time of diagnosis she had positive lymph nodes in the pelvis on CT imaging. She was treated with primary chemo-radiation which was completed on April 8th. The patient reports completing the course of therapy on schedule with no delays.  She reports that when she completed therapy there was some concern by her treating physicians that her response had been incomplete. They had planned to wait until she was further out from therapy before obtaining a PET scan to evaluate for response.   In May, 2016 she developed febrile morbidity and manifestionas of sepsis. She presented to the [facility]  ER and was admitted and noted to have a urinary tract infection and right ureteral obstruction which was new. She was treated with pressor support, IV antibiotics, and a right perinephric drain for the right perinephric abscess.   On June 9th, 2016 she underwent PET/CT to evaluate for recurrent disease as the cause of her new right ureteral obstruction (CT had not clearly shown recurrence/progression). It demonstrated:  There is irregular soft tissue thickening along the posterior wall of the cecum which is hypermetabolic. SUV max is 6.8. This is worrisome for tumor infiltration. There are also metabolically active small mesenteric lymph nodes near the right colon (largest node measures 7 mm). The uterus remains enlarged with marked dilatation of the endometrial canal which is filled with fluid. This is likely due to cervical obstruction from the patient's cervical cancer. Several foci of hypermetabolism are noted in the myometrium. Could not exclude tumor implants. This could be residual tumor or radiation change. There are small pelvic sidewall lymph nodes but they are not hypermetabolic. There are borderline enlarged inguinal lymph nodes which are hypermetabolic.   She was started on salvage chemotherapy with cisplatin, paclitaxel and bevacizumab on 11/19/14.   CT scan of the chest abdomen and pelvis performed on 02/21/2015 revealed no new lesions, mild prominence and enhancement of the cervix consistent with treatment effect, small residual ileocolic and right abdominal mesenteric lymph nodes measuring 4-6 mm mildly improved. No distant metastasis. Right renal atrophy with resolving subcapsular seroma and hematoma.  She was admitted to [facility] hospital with sepsis between 03/23/15 and 03/27/15 and diagnosed with a pyometrium which we evacuated with D&C in the OR (biopsies of  cervix were benign). After discharge she received cycle 6 of therapy (without avastin), and then continued to have fevers. She was  treated with a course of amoxicillin and flagyl.    Interval History: She has completed 7 cycles of cddp/taxol/bev on 05/04/15. She is tolerating this well. She is working. She is extremely fatigued.  She had green vaginal discharge and low grade fevers on 05/19/15. For this she was started on Flagyl and has begun feeling better with no fevers and clearing of the vaginal drainage. She has excoriation and irritation of the labia.  Current Meds:  Outpatient Encounter Prescriptions as of 05/26/2015  Medication Sig  . dexamethasone (DECADRON) 4 MG tablet Take 5 tablets with food 12 hrs and 6 hrs prior to Taxol Chemotherapy  . docusate sodium (COLACE) 100 MG capsule Take 100 mg by mouth daily as needed for mild constipation.   . gabapentin (NEURONTIN) 100 MG capsule Take 1 capsule (100 mg total) by mouth at bedtime. Or as directed for pain. Will make you drowsy.  Marland Kitchen HEMOCYTE 324 (106 FE) MG TABS Take 325 mg by mouth daily. Take 1 tablet daily on empty stomach with Vitamin C tablet  . ibuprofen (ADVIL,MOTRIN) 200 MG tablet Take 400 mg by mouth every 6 (six) hours as needed.  . lactose free nutrition (BOOST PLUS) LIQD Take 237 mLs by mouth 3 (three) times daily with meals. (Patient taking differently: Take 237 mLs by mouth daily. )  . LORazepam (ATIVAN) 1 MG tablet Place 1/2 - 1 tablet under the tongue or swallow evry 4-6 hrs as needed for nausea  . Magnesium Oxide 500 MG CAPS Take 500 mg by mouth daily.  . pantoprazole (PROTONIX) 40 MG tablet Take 1 tablet (40 mg total) by mouth daily.  . potassium chloride (K-DUR,KLOR-CON) 10 MEQ tablet Take 10 mEq by mouth daily.  . traMADol (ULTRAM) 50 MG tablet Take 1-2 tablets (50-100 mg total) by mouth every 8 (eight) hours.  . vitamin C (ASCORBIC ACID) 500 MG tablet Take 500 mg by mouth daily.  Marland Kitchen acetaminophen (TYLENOL) 500 MG tablet Take 1,000 mg by mouth 2 (two) times daily as needed for moderate pain, fever or headache. Reported on 05/26/2015  . albuterol  (PROVENTIL HFA;VENTOLIN HFA) 108 (90 BASE) MCG/ACT inhaler Inhale 2 puffs into the lungs every 6 (six) hours as needed for wheezing or shortness of breath. Reported on 05/26/2015  . diphenoxylate-atropine (LOMOTIL) 2.5-0.025 MG per tablet Take 1 tablet by mouth as needed for diarrhea or loose stools. Reported on 05/26/2015  . loratadine (CLARITIN) 10 MG tablet Take 10 mg by mouth daily as needed for allergies. Reported on 05/26/2015  . [DISCONTINUED] fentaNYL (DURAGESIC - DOSED MCG/HR) 12 MCG/HR Place 1 patch (12.5 mcg total) onto the skin every 3 (three) days.  . [DISCONTINUED] metroNIDAZOLE (FLAGYL) 500 MG tablet Take 1 tablet (500 mg total) by mouth 3 (three) times daily.   Facility-Administered Encounter Medications as of 05/26/2015  Medication  . dextrose 5 % and 0.9% NaCl 1,000 mL with potassium chloride 20 mEq, magnesium sulfate 12 mEq, mannitol 12.5 g infusion  . dextrose 5 % and 0.9% NaCl 1,000 mL with potassium chloride 20 mEq, magnesium sulfate 12 mEq, mannitol 12.5 g infusion    Allergy:  Allergies  Allergen Reactions  . Morphine And Related Rash    Per pt, morphine also makes her vomit  . Zofran [Ondansetron Hcl] Rash    Veins with erythema after zofran infused.  . Codeine Nausea Only  . Hydrocodone  Nausea And Vomiting  . Peach [Prunus Persica] Swelling and Rash    Social Hx:   Social History   Social History  . Marital Status: Married    Spouse Name: N/A  . Number of Children: N/A  . Years of Education: N/A   Occupational History  . Not on file.   Social History Main Topics  . Smoking status: Never Smoker   . Smokeless tobacco: Never Used  . Alcohol Use: No  . Drug Use: No  . Sexual Activity: Not on file   Other Topics Concern  . Not on file   Social History Narrative    Past Surgical Hx:  Past Surgical History  Procedure Laterality Date  . Cystoscopy w/ ureteral stent placement Right 10/13/2014    Procedure: CYSTOSCOPY WITH RETROGRADE  PYELOGRAM/URETERAL STENT PLACEMENT;  Surgeon: Festus Aloe, MD;  Location: [facility];  Service: Urology;  Laterality: Right;  . Transthoracic echocardiogram  10-13-2014    mild LVH,  ef 55-60%/  mild TR  . Tonsillectomy and adenoidectomy  age 52    age 25--  redo-- Adenoidectomy  . Laparoscopic cholecystectomy  1990's  . Cystoscopy with ureteroscopy and stent placement Right 02/18/2015    Procedure: CYSTO WITH RIGHT URETERAL STENT EXCHANGE, RIGHT URETEROSCOPY ;  Surgeon: Festus Aloe, MD;  Location: [facility];  Service: Urology;  Laterality: Right;  . Cystoscopy with biopsy N/A 02/18/2015    Procedure: CYSTOSCOPY WITH  BLADDER  BIOPSY;  Surgeon: Festus Aloe, MD;  Location: [facility];  Service: Urology;  Laterality: N/A;  . Cystoscopy w/ retrogrades Right 02/18/2015    Procedure: CYSTOSCOPY WITH RETROGRADE PYELOGRAM;  Surgeon: Festus Aloe, MD;  Location: [facility];  Service: Urology;  Laterality: Right;  . Dilation and curettage of uterus N/A 03/25/2015    Procedure: DILATATION AND CURETTAGE WITH ULTRASOUND;  Surgeon: Everitt Amber, MD;  Location: [facility];  Service: Gynecology;  Laterality: N/A;    Past Medical Hx:  Past Medical History  Diagnosis Date  . Hydronephrosis, right   . Ureteral stricture, right   . History of septic shock     05/ 2016  due to Peptostriptococcus/  right hydronephrosis and obstruction----  resolved  . 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 Mild intermittent asthma   . History of Clostridium difficile     10-15-2014  resolved  . Left rotator cuff tear arthropathy   . White coat syndrome without hypertension   . GERD (gastroesophageal reflux disease)   . Frequency of urination   . Anemia associated with chemotherapy     Antineoplastic chemo  . Recurrent cervical cancer [facility]) oncologist-  dr Nayeli Calvert/ dr Marko Plume    dx 12/ 2015---  Radiation and Chemo (07-06-2014 to 09-03-2014)  now  recurrent ,  Stage IIB  w/ Mets to pelvic/ retroperitoneum---  CHEMOTHERAPY CURRENTLY  . 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 Cervical cancer [facility])     Past Gynecological History:  Cervical cancer 2015. Preceding this the patient has no prior history of abnormal pap smears, and had been up to date with cervical cancer screening.  Patient's last menstrual period was 11/05/2011.  Family Hx: History reviewed. No pertinent family history.  Review of Systems:  Constitutional  Feels well,  fatigued  ENT Normal appearing ears and nares bilaterally Skin/Breast  No rash, sores, jaundice, itching, dryness Cardiovascular  No chest pain, shortness of breath, or edema  Pulmonary  No cough or  wheeze.  Gastro Intestinal  No nausea, vomitting, or diarrhoea. No bright red blood per rectum, no abdominal pain, change in bowel movement, or constipation.  Genito Urinary  No frequency, urgency, dysuria,  see HPI Musculo Skeletal  No myalgia, arthralgia, joint swelling or pain  Neurologic  No weakness, numbness, change in gait,  Psychology  No depression, anxiety, insomnia.   Vitals:  Blood pressure 127/75, pulse 90, temperature 98.3 F (36.8 C), temperature source Oral, resp. rate 18, height 5\' 4"  (1.626 m), weight 147 lb 4.8 oz (66.815 kg), last menstrual period 11/05/2011, SpO2 100 %.  Physical Exam: General: no distress Pulm: CTAB CVS: RRR, no murmur Abd: soft, nondistended, nonpainful Lymph: no cervical or inguinal adenopathy Back : no cva tenderness Genitourinary: Normal BUS. Vagina with agglutination - broken up digitally. Cervix grossly normal in appearance but hard and nodular. No apparent new tumor. Moderate drainage (whitish). Erythematous labia minora. Rectovaginal exam: no palpable tumor in pelvis or nodularity.   Donaciano Eva, MD  05/26/2015, 4:29 PM

## 2015-05-26 NOTE — Patient Instructions (Signed)
Plan for a CT scan on Jan 5 at [facility].  We will call you with the results.  Please call for any questions or concerns.

## 2015-05-27 ENCOUNTER — Other Ambulatory Visit: Payer: Self-pay | Admitting: Oncology

## 2015-05-28 ENCOUNTER — Encounter: Payer: Self-pay | Admitting: Gynecologic Oncology

## 2015-05-31 ENCOUNTER — Encounter: Payer: Self-pay | Admitting: Oncology

## 2015-05-31 NOTE — Telephone Encounter (Signed)
S/w pt that Dr Marko Plume prepared the letter. Pt asked to pick up the letter when she comes to pick up her CT contrast. Letting in envelope given to [place] at front desk.

## 2015-06-01 ENCOUNTER — Telehealth: Payer: Self-pay

## 2015-06-01 NOTE — Telephone Encounter (Signed)
Incoming fax from [place] about a denial. A case number was on form. Fax given to [place].

## 2015-06-01 NOTE — Telephone Encounter (Signed)
Patient called to update Dr Everitt Amber that she has had 2 episodes of vaginal "bleeding " that each lasted for about an "hour" . Patient states she is feeling "tired" and is wondering if she needs a "blood" transfusion. Writer updated [employer] ,APNP , recent labs reviewed , no need for blood transfusion. Patient contacted and updated with NP recommendations , patient instructed to continue to call with all changes and concerns . Patient states understanding , patient also educated with the importance of keeping her CT scan with contrast on 06/02/2015 at 1 PM . Patient states she will keep her appointment ,denies further questions at this time.

## 2015-06-02 ENCOUNTER — Ambulatory Visit (HOSPITAL_COMMUNITY): Payer: BC Managed Care – PPO

## 2015-06-02 ENCOUNTER — Other Ambulatory Visit: Payer: Self-pay | Admitting: Gynecologic Oncology

## 2015-06-02 ENCOUNTER — Encounter: Payer: Self-pay | Admitting: Oncology

## 2015-06-02 ENCOUNTER — Other Ambulatory Visit: Payer: Self-pay

## 2015-06-02 DIAGNOSIS — C539 Malignant neoplasm of cervix uteri, unspecified: Secondary | ICD-10-CM

## 2015-06-02 MED ORDER — TRAMADOL HCL 50 MG PO TABS: 50.0000 mg | ORAL_TABLET | Freq: Three times a day (TID) | ORAL | Status: DC | PRN

## 2015-06-07 ENCOUNTER — Ambulatory Visit (HOSPITAL_COMMUNITY)
Admission: RE | Admit: 2015-06-07 | Discharge: 2015-06-07 | Disposition: A | Discharge status: Home / Self Care | Payer: BC Managed Care – PPO | Source: Ambulatory Visit | Attending: Oncology | Admitting: Oncology

## 2015-06-07 ENCOUNTER — Ambulatory Visit (HOSPITAL_BASED_OUTPATIENT_CLINIC_OR_DEPARTMENT_OTHER): Payer: BC Managed Care – PPO | Admitting: Nurse Practitioner

## 2015-06-07 ENCOUNTER — Ambulatory Visit (HOSPITAL_BASED_OUTPATIENT_CLINIC_OR_DEPARTMENT_OTHER): Payer: BC Managed Care – PPO

## 2015-06-07 ENCOUNTER — Telehealth: Payer: Self-pay | Admitting: *Deleted

## 2015-06-07 ENCOUNTER — Other Ambulatory Visit: Payer: Self-pay | Admitting: *Deleted

## 2015-06-07 VITALS — BP 153/80 | HR 120 | Temp 99.8°F | Resp 18 | Ht 64.0 in | Wt 142.2 lb

## 2015-06-07 DIAGNOSIS — C530 Malignant neoplasm of endocervix: Secondary | ICD-10-CM | POA: Diagnosis not present

## 2015-06-07 DIAGNOSIS — N939 Abnormal uterine and vaginal bleeding, unspecified: Secondary | ICD-10-CM | POA: Insufficient documentation

## 2015-06-07 DIAGNOSIS — E860 Dehydration: Secondary | ICD-10-CM

## 2015-06-07 DIAGNOSIS — D62 Acute posthemorrhagic anemia: Secondary | ICD-10-CM

## 2015-06-07 DIAGNOSIS — D500 Iron deficiency anemia secondary to blood loss (chronic): Secondary | ICD-10-CM

## 2015-06-07 DIAGNOSIS — C539 Malignant neoplasm of cervix uteri, unspecified: Secondary | ICD-10-CM

## 2015-06-07 DIAGNOSIS — C7989 Secondary malignant neoplasm of other specified sites: Secondary | ICD-10-CM

## 2015-06-07 DIAGNOSIS — E46 Unspecified protein-calorie malnutrition: Secondary | ICD-10-CM

## 2015-06-07 DIAGNOSIS — G893 Neoplasm related pain (acute) (chronic): Secondary | ICD-10-CM

## 2015-06-07 DIAGNOSIS — E8809 Other disorders of plasma-protein metabolism, not elsewhere classified: Secondary | ICD-10-CM

## 2015-06-07 LAB — CBC WITH DIFFERENTIAL/PLATELET
BASO%: 0.4 % (ref 0.0–2.0)
Basophils Absolute: 0.1 10*3/uL (ref 0.0–0.1)
EOS%: 0.4 % (ref 0.0–7.0)
Eosinophils Absolute: 0 10*3/uL (ref 0.0–0.5)
HCT: 26.1 % — ABNORMAL LOW (ref 34.8–46.6)
HGB: 8.2 g/dL — ABNORMAL LOW (ref 11.6–15.9)
LYMPH#: 0.6 10*3/uL — AB (ref 0.9–3.3)
LYMPH%: 4.7 % — AB (ref 14.0–49.7)
MCH: 30.1 pg (ref 25.1–34.0)
MCHC: 31.5 g/dL (ref 31.5–36.0)
MCV: 95.6 fL (ref 79.5–101.0)
MONO#: 1.4 10*3/uL — ABNORMAL HIGH (ref 0.1–0.9)
MONO%: 11.1 % (ref 0.0–14.0)
NEUT#: 10.3 10*3/uL — ABNORMAL HIGH (ref 1.5–6.5)
NEUT%: 83.4 % — AB (ref 38.4–76.8)
Platelets: 611 10*3/uL — ABNORMAL HIGH (ref 145–400)
RBC: 2.73 10*6/uL — AB (ref 3.70–5.45)
RDW: 19.1 % — ABNORMAL HIGH (ref 11.2–14.5)
WBC: 12.3 10*3/uL — ABNORMAL HIGH (ref 3.9–10.3)

## 2015-06-07 LAB — COMPREHENSIVE METABOLIC PANEL
ALBUMIN: 2.6 g/dL — AB (ref 3.5–5.0)
ALK PHOS: 86 U/L (ref 40–150)
ALT: 29 U/L (ref 0–55)
AST: 23 U/L (ref 5–34)
Anion Gap: 12 mEq/L — ABNORMAL HIGH (ref 3–11)
BUN: 20.6 mg/dL (ref 7.0–26.0)
CALCIUM: 9.6 mg/dL (ref 8.4–10.4)
CO2: 26 mEq/L (ref 22–29)
CREATININE: 0.9 mg/dL (ref 0.6–1.1)
Chloride: 93 mEq/L — ABNORMAL LOW (ref 98–109)
EGFR: 70 mL/min/{1.73_m2} — ABNORMAL LOW (ref >90)
GLUCOSE: 127 mg/dL (ref 70–140)
POTASSIUM: 3.9 meq/L (ref 3.5–5.1)
SODIUM: 131 meq/L — AB (ref 136–145)
Total Bilirubin: <0.3 mg/dL (ref 0.20–1.20)
Total Protein: 7.7 g/dL (ref 6.4–8.3)

## 2015-06-07 LAB — WET PREP, GENITAL
Clue Cells Wet Prep HPF POC: NONE SEEN
SPERM: NONE SEEN
TRICH WET PREP: NONE SEEN
YEAST WET PREP: NONE SEEN

## 2015-06-07 LAB — LACTATE DEHYDROGENASE: LDH: 143 U/L (ref 125–245)

## 2015-06-07 LAB — RETICULOCYTES (CHCC)
Immature Retic Fract: 17.8 % — ABNORMAL HIGH (ref 1.60–10.00)
RBC: 2.66 10*6/uL — ABNORMAL LOW (ref 3.70–5.45)
RETIC %: 3.23 % — AB (ref 0.70–2.10)
RETIC CT ABS: 85.92 10*3/uL (ref 33.70–90.70)

## 2015-06-07 MED ORDER — SODIUM CHLORIDE 0.9 % IV SOLN
INTRAVENOUS | Status: AC
  Administered 2015-06-07: 16:00:00 via INTRAVENOUS

## 2015-06-07 MED ORDER — METRONIDAZOLE 500 MG PO TABS: 500.0000 mg | ORAL_TABLET | Freq: Two times a day (BID) | ORAL | Status: DC

## 2015-06-07 MED ORDER — METRONIDAZOLE 500 MG PO TABS: 500.0000 mg | ORAL_TABLET | Freq: Three times a day (TID) | ORAL | Status: DC

## 2015-06-07 NOTE — Telephone Encounter (Signed)
TC from patient stating that her weakness is getting worse, that she is unable to even ambulate around her house, unable to go to work and is using a wheelchair.  She states she is dizzy and lightheaded.  As noted earlier in the month-she had 2 episodes of heavy vaginal bleeding. No labs since 05/02/15. Pt feeling poorly and wonders if she needs lab work/transfusion/visit with MD  Spoke with Barbaraann Share with [name]. Barbaraann Share will speak with Stacy John, NP with Dr. [name] for recommendations as [name] is not in the office this week.  Dr. [name] unable to see pt today d/t OR schedule.   POF for labs and visit in [facility] sent for today

## 2015-06-07 NOTE — Patient Instructions (Signed)

## 2015-06-08 ENCOUNTER — Ambulatory Visit (HOSPITAL_COMMUNITY)
Admission: RE | Admit: 2015-06-08 | Discharge: 2015-06-08 | Disposition: A | Discharge status: Home / Self Care | Payer: BC Managed Care – PPO | Source: Ambulatory Visit | Attending: Oncology | Admitting: Oncology

## 2015-06-08 ENCOUNTER — Telehealth: Payer: Self-pay | Admitting: Gynecologic Oncology

## 2015-06-08 VITALS — BP 147/82 | HR 96 | Temp 98.8°F | Resp 16

## 2015-06-08 DIAGNOSIS — N939 Abnormal uterine and vaginal bleeding, unspecified: Secondary | ICD-10-CM

## 2015-06-08 LAB — PREPARE RBC (CROSSMATCH)

## 2015-06-08 LAB — HAPTOGLOBIN: Haptoglobin: 543 mg/dL (ref 34–200)

## 2015-06-08 LAB — SAMPLE TO BLOOD BANK

## 2015-06-08 MED ORDER — ACETAMINOPHEN 325 MG PO TABS
650.0000 mg | ORAL_TABLET | Freq: Once | ORAL | Status: AC
  Administered 2015-06-08: 650 mg via ORAL
  Filled 2015-06-08: qty 2

## 2015-06-08 MED ORDER — DIPHENHYDRAMINE HCL 25 MG PO CAPS
25.0000 mg | ORAL_CAPSULE | Freq: Once | ORAL | Status: AC
  Administered 2015-06-08: 25 mg via ORAL
  Filled 2015-06-08: qty 1

## 2015-06-08 MED ORDER — SODIUM CHLORIDE 0.9 % IJ SOLN: 10.0000 mL | INTRAMUSCULAR | Status: DC | PRN

## 2015-06-08 MED ORDER — SODIUM CHLORIDE 0.9 % IV SOLN
250.0000 mL | Freq: Once | INTRAVENOUS | Status: AC
  Administered 2015-06-08: 250 mL via INTRAVENOUS

## 2015-06-08 MED ORDER — HEPARIN SOD (PORK) LOCK FLUSH 100 UNIT/ML IV SOLN: 500.0000 [IU] | Freq: Every day | INTRAVENOUS | Status: DC | PRN

## 2015-06-08 MED ORDER — HEPARIN SOD (PORK) LOCK FLUSH 100 UNIT/ML IV SOLN: 250.0000 [IU] | INTRAVENOUS | Status: DC | PRN

## 2015-06-08 MED ORDER — SODIUM CHLORIDE 0.9 % IJ SOLN: 3.0000 mL | INTRAMUSCULAR | Status: DC | PRN

## 2015-06-08 NOTE — Progress Notes (Signed)
Provider: Dr Evlyn Clines Diagnosis: Vaginal Bleeding Procedure: Patient received 2 units of PRBC's with no reaction to transfusion.  Patient ambulatory at discharge.

## 2015-06-08 NOTE — Telephone Encounter (Signed)
Attempted to call patient but unavailable.  Returned call to patient's friend who was with her today during the blood transfusion.  Her friend is reporting the patient as having moderate clear to yellowish vaginal discharge that is soaking through depends at nighttime.  She is voiding but "not much just little bits at at time."  CT in the am at 9 am.  Advised of Dr. Serita Grit recommendations for a transvaginal US to better eval the size of uterine collection "along with that if her uterus is draining then that is good. It becomes a problem if it stops draining."  Advised the friend that we would follow up on the results of the CT then order the [place] or proceed with whatever intervention is necessary.

## 2015-06-09 ENCOUNTER — Other Ambulatory Visit: Payer: Self-pay

## 2015-06-09 ENCOUNTER — Encounter: Payer: Self-pay | Admitting: Nurse Practitioner

## 2015-06-09 ENCOUNTER — Encounter (HOSPITAL_COMMUNITY): Payer: Self-pay

## 2015-06-09 ENCOUNTER — Ambulatory Visit (HOSPITAL_COMMUNITY)
Admission: RE | Admit: 2015-06-09 | Discharge: 2015-06-09 | Disposition: A | Discharge status: Home / Self Care | Payer: BC Managed Care – PPO | Source: Ambulatory Visit | Attending: Gynecologic Oncology | Admitting: Gynecologic Oncology

## 2015-06-09 ENCOUNTER — Telehealth: Payer: Self-pay | Admitting: *Deleted

## 2015-06-09 DIAGNOSIS — C530 Malignant neoplasm of endocervix: Secondary | ICD-10-CM | POA: Diagnosis not present

## 2015-06-09 DIAGNOSIS — Z923 Personal history of irradiation: Secondary | ICD-10-CM | POA: Diagnosis not present

## 2015-06-09 DIAGNOSIS — E8809 Other disorders of plasma-protein metabolism, not elsewhere classified: Secondary | ICD-10-CM | POA: Insufficient documentation

## 2015-06-09 DIAGNOSIS — D62 Acute posthemorrhagic anemia: Secondary | ICD-10-CM | POA: Insufficient documentation

## 2015-06-09 DIAGNOSIS — R188 Other ascites: Secondary | ICD-10-CM | POA: Insufficient documentation

## 2015-06-09 DIAGNOSIS — N261 Atrophy of kidney (terminal): Secondary | ICD-10-CM | POA: Diagnosis not present

## 2015-06-09 DIAGNOSIS — E46 Unspecified protein-calorie malnutrition: Secondary | ICD-10-CM | POA: Insufficient documentation

## 2015-06-09 DIAGNOSIS — K219 Gastro-esophageal reflux disease without esophagitis: Secondary | ICD-10-CM

## 2015-06-09 DIAGNOSIS — E860 Dehydration: Secondary | ICD-10-CM | POA: Insufficient documentation

## 2015-06-09 DIAGNOSIS — Z9689 Presence of other specified functional implants: Secondary | ICD-10-CM | POA: Diagnosis not present

## 2015-06-09 LAB — TYPE AND SCREEN
ABO/RH(D): A POS
ANTIBODY SCREEN: NEGATIVE
UNIT DIVISION: 0
UNIT DIVISION: 0

## 2015-06-09 MED ORDER — IOHEXOL 300 MG/ML  SOLN
100.0000 mL | Freq: Once | INTRAMUSCULAR | Status: AC | PRN
  Administered 2015-06-09: 100 mL via INTRAVENOUS

## 2015-06-09 MED ORDER — PANTOPRAZOLE SODIUM 40 MG PO TBEC: 40.0000 mg | DELAYED_RELEASE_TABLET | Freq: Every day | ORAL | Status: DC

## 2015-06-09 NOTE — Assessment & Plan Note (Signed)
Patient received her last Avastin therapy on 05/05/2015.  Patient reports increased vaginal bleeding with bright red blood within the past few days.  She also reports some gushing of clear odorous vaginal fluid as well.  She states that she is sometimes using 2 sanitary pads at once in changing the pads every hour.  She reports increased fatigue/weakness and shortness of breath.  Her maximum fever has been 99.8.  Exam today revealed no copious vaginal bleeding or fluid discharge with brief pelvic exam.  Wet prep.  Vaginal swab was obtained and pending results.  However, patient was noted to frequently be getting up to go to the bathroom and change her pads area.  Blood counts obtained today reveal a WBC of 12.3, ANC 10.3, hemoglobin 8.2, platelet count 611.  Patient will receive IV fluid rehydration today; and return tomorrow morning to receive 2 units packed red blood cells transfusion.  Reviewed all findings in detail with Dr. [name] this afternoon.  Patient will be prescribed Flagyl 500 mg 3 times per day for treatment of probable recurrent vaginal infection.  Patient is scheduled to return for a restaging CT on Thursday, 06/09/2015.  She will also follow-up with Joylene John, nurse practitioner and Dr. [name] as needed.  Patient is scheduled to return on 06/23/2015 for labs and a follow-up visit with [name].

## 2015-06-09 NOTE — Assessment & Plan Note (Signed)
Abdomen has decreased to 2.6.  Patient was crushed push protein in her diet is much as possible.

## 2015-06-09 NOTE — Progress Notes (Signed)
SYMPTOM MANAGEMENT CLINIC   HPI: Stacy Webster 55 y.o. female diagnosed with endometrial cancer.  Currently undergoing Avastin therapy.   Patient received her last Avastin therapy on 05/05/2015.  Patient reports increased vaginal bleeding with bright red blood within the past few days.  She also reports some gushing of clear odorous vaginal fluid as well.  She states that she is sometimes using 2 sanitary pads at once in changing the pads every hour.  She reports increased fatigue/weakness and shortness of breath.  Her maximum fever has been 99.8.  Exam today revealed no copious vaginal bleeding or fluid discharge with brief pelvic exam.  Wet prep.  Vaginal swab was obtained and pending results.  However, patient was noted to frequently be getting up to go to the bathroom and change her pads area.  Blood counts obtained today reveal a WBC of 12.3, ANC 10.3, hemoglobin 8.2, platelet count 611.  Patient will receive IV fluid rehydration today; and return tomorrow morning to receive 2 units packed red blood cells transfusion.  Reviewed all findings in detail with Dr. [name] this afternoon.  Patient will be prescribed Flagyl 500 mg 3 times per day for treatment of probable recurrent vaginal infection.  Patient is scheduled to return for a restaging CT on Thursday, 06/09/2015.  She will also follow-up with Joylene John, nurse practitioner and Dr. [name] as needed.  Patient is scheduled to return on 06/23/2015 for labs and a follow-up visit with [name].  HPI  ROS  Past Medical History  Diagnosis Date  . Hydronephrosis, right   . Ureteral stricture, right   . History of septic shock     05/ 2016  due to Peptostriptococcus/  right hydronephrosis and obstruction----  resolved  . 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 Mild intermittent asthma   . History of Clostridium difficile     10-15-2014  resolved  . Left rotator cuff tear arthropathy   . White coat  syndrome without hypertension   . GERD (gastroesophageal reflux disease)   . Frequency of urination   . Anemia associated with chemotherapy     Antineoplastic chemo  . Recurrent cervical cancer [facility]) oncologist-  dr Rossi/ dr Marko Plume    dx 12/ 2015---  Radiation and Chemo (07-06-2014 to 09-03-2014)  now recurrent ,  Stage IIB  w/ Mets to pelvic/ retroperitoneum---  CHEMOTHERAPY CURRENTLY  . 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 Cervical cancer [facility])     Past Surgical History  Procedure Laterality Date  . Cystoscopy w/ ureteral stent placement Right 10/13/2014    Procedure: CYSTOSCOPY WITH RETROGRADE PYELOGRAM/URETERAL STENT PLACEMENT;  Surgeon: Festus Aloe, MD;  Location: [facility];  Service: Urology;  Laterality: Right;  . Transthoracic echocardiogram  10-13-2014    mild LVH,  ef 55-60%/  mild TR  . Tonsillectomy and adenoidectomy  age 21    age 28--  redo-- Adenoidectomy  . Laparoscopic cholecystectomy  1990's  . Cystoscopy with ureteroscopy and stent placement Right 02/18/2015    Procedure: CYSTO WITH RIGHT URETERAL STENT EXCHANGE, RIGHT URETEROSCOPY ;  Surgeon: Festus Aloe, MD;  Location: [facility];  Service: Urology;  Laterality: Right;  . Cystoscopy with biopsy N/A 02/18/2015    Procedure: CYSTOSCOPY WITH  BLADDER  BIOPSY;  Surgeon: Festus Aloe, MD;  Location: [facility];  Service: Urology;  Laterality: N/A;  . Cystoscopy w/ retrogrades Right 02/18/2015    Procedure: CYSTOSCOPY WITH RETROGRADE PYELOGRAM;  Surgeon: Festus Aloe, MD;  Location: [facility];  Service: Urology;  Laterality: Right;  . 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    is allergic to morphine and related; zofran; codeine; hydrocodone; and peach.    Medication List       This list is accurate as of: 06/07/15 11:59 PM.  Always use your most recent med list.               acetaminophen 500 MG tablet  Commonly known as:  TYLENOL  Take 1,000 mg by mouth 2 (two) times daily as needed for moderate pain, fever or headache. Reported on 06/07/2015     albuterol 108 (90 Base) MCG/ACT inhaler  Commonly known as:  PROVENTIL HFA;VENTOLIN HFA  Inhale 2 puffs into the lungs every 6 (six) hours as needed for wheezing or shortness of breath. Reported on 05/26/2015     dexamethasone 4 MG tablet  Commonly known as:  DECADRON  Take 5 tablets with food 12 hrs and 6 hrs prior to Taxol Chemotherapy     diphenoxylate-atropine 2.5-0.025 MG tablet  Commonly known as:  LOMOTIL  Take 1 tablet by mouth as needed for diarrhea or loose stools. Reported on 06/07/2015     docusate sodium 100 MG capsule  Commonly known as:  COLACE  Take 100 mg by mouth daily as needed for mild constipation.     gabapentin 100 MG capsule  Commonly known as:  NEURONTIN  Take 1 capsule (100 mg total) by mouth at bedtime. Or as directed for pain. Will make you drowsy.     HEMOCYTE 324 (106  Fe) MG Tabs  Generic drug:  Ferrous Fumarate  Take 325 mg by mouth daily. Take 1 tablet daily on empty stomach with Vitamin C tablet     ibuprofen 200 MG tablet  Commonly known as:  ADVIL,MOTRIN  Take 400 mg by mouth every 6 (six) hours as needed.     lactose free nutrition Liqd  Take 237 mLs by mouth 3 (three) times daily with meals.     loratadine 10 MG tablet  Commonly known as:  CLARITIN  Take 10 mg by mouth daily as needed for allergies. Reported on 06/07/2015     LORazepam 1 MG tablet  Commonly known as:  ATIVAN  Place 1/2 - 1 tablet under the tongue or swallow evry 4-6 hrs as needed for nausea     Magnesium Oxide 500 MG Caps  Take 500 mg by mouth daily.     metroNIDAZOLE 500 MG tablet  Commonly known as:  FLAGYL  Take 1 tablet (500 mg total) by mouth 3 (three) times daily.     pantoprazole 40 MG tablet  Commonly known as:  PROTONIX  Take 1 tablet (40 mg total) by mouth daily.     potassium chloride 10 MEQ tablet  Commonly known as:  K-DUR,KLOR-CON  Take 10 mEq by mouth daily.     traMADol 50 MG tablet  Commonly known as:  ULTRAM  Take 1-2 tablets (50-100 mg total) by mouth every 8 (eight) hours as needed.     vitamin C 500 MG tablet  Commonly known as:  ASCORBIC ACID  Take 500 mg by mouth daily.         PHYSICAL EXAMINATION  Oncology Vitals 06/08/2015 06/08/2015  Height - -  Weight - -  Weight (lbs) - -  BMI (kg/m2) - -  Temp 98.8 98.5  Pulse 96 95  Resp 16 20  SpO2 100 99  BSA (m2) - -  Some encounter information is confidential and restricted. Go to Review Flowsheets activity to see all data.   BP Readings from Last 2 Encounters:  06/08/15 147/82  06/07/15 143/68    Physical Exam  Constitutional: She is oriented to person, place, and time. Vital signs are normal. She appears dehydrated. She appears unhealthy.  HENT:  Head: Normocephalic and atraumatic.  Eyes: Conjunctivae and EOM are normal. Pupils are equal, round, and reactive to light.  Right eye exhibits no discharge. Left eye exhibits no discharge. No scleral icterus.  Neck: Normal range of motion.  Pulmonary/Chest: Effort normal. No respiratory distress.  Abdominal: Soft. Bowel sounds are normal. She exhibits no distension and no mass. There is no tenderness. There is no rebound and no guarding.  Genitourinary: Vaginal discharge found.  Wet prep obtained; and there was a moderately foul vaginal odor on exam.  Musculoskeletal: Normal range of motion. She exhibits no edema or tenderness.  Neurological: She is alert and oriented to person, place, and time.  Skin: Skin is warm and dry. No rash noted. No erythema. There is pallor.  Psychiatric: Affect normal.  Nursing note and vitals reviewed.   LABORATORY DATA:. Hospital Outpatient Visit on 06/07/2015  Component Date Value Ref Range Status  . Yeast Wet Prep HPF POC 06/07/2015 NONE SEEN  NONE SEEN Final  . Trich, Wet Prep 06/07/2015 NONE SEEN  NONE SEEN Final  . Clue Cells Wet Prep HPF POC 06/07/2015 NONE SEEN  NONE SEEN Final  . WBC, Wet Prep HPF POC 06/07/2015 MODERATE* NONE SEEN Final  . Sperm 06/07/2015 NONE SEEN   Final  . Blood Bank Specimen 06/07/2015 SAMPLE AVAILABLE FOR TESTING   Final  . Sample Expiration 06/07/2015 06/10/2015   Final  . Order Confirmation 06/07/2015 ORDER PROCESSED BY BLOOD BANK   Final  . ABO/RH(D) 06/07/2015 A POS   Final  . Antibody Screen 06/07/2015 NEG   Final  . Sample Expiration 06/07/2015 06/10/2015   Final  . Unit Number 06/07/2015 [id number]   Final  . Blood Component Type 06/07/2015 RED CELLS,LR   Final  . Unit division 06/07/2015 00   Final  . Status of Unit 06/07/2015 ISSUED,FINAL   Final  . Transfusion Status 06/07/2015 OK TO TRANSFUSE   Final  . Crossmatch Result 06/07/2015 Compatible   Final  . Unit Number 06/07/2015 [id number]   Final  . Blood Component Type 06/07/2015 RED CELLS,LR   Final  . Unit division 06/07/2015 00   Final  . Status of Unit 06/07/2015  ISSUED,FINAL   Final  . Transfusion Status 06/07/2015 OK TO TRANSFUSE   Final  . Crossmatch Result 06/07/2015 Compatible   Final  Office Visit on 06/07/2015  Component Date Value Ref Range Status  . Haptoglobin 06/07/2015 543* 34 - 200 mg/dL Final  Appointment on 06/07/2015  Component Date Value Ref Range Status  . WBC 06/07/2015 12.3* 3.9 - 10.3 10e3/uL Final  . NEUT# 06/07/2015 10.3* 1.5 - 6.5 10e3/uL Final  . HGB 06/07/2015 8.2* 11.6 - 15.9 g/dL Final  . HCT 06/07/2015 26.1* 34.8 - 46.6 % Final  . Platelets 06/07/2015 611* 145 - 400 10e3/uL Final  . MCV 06/07/2015 95.6  79.5 - 101.0 fL Final  . MCH 06/07/2015 30.1  25.1 - 34.0 pg Final  . MCHC 06/07/2015 31.5  31.5 - 36.0 g/dL Final  . RBC 06/07/2015 2.73* 3.70 - 5.45 10e6/uL Final  . RDW 06/07/2015 19.1* 11.2 - 14.5 % Final  . lymph# 06/07/2015 0.6* 0.9 - 3.3 10e3/uL Final  . MONO# 06/07/2015 1.4* 0.1 - 0.9 10e3/uL Final  . Eosinophils Absolute 06/07/2015 0.0  0.0 - 0.5 10e3/uL Final  . Basophils Absolute 06/07/2015 0.1  0.0 - 0.1 10e3/uL Final  . NEUT% 06/07/2015 83.4* 38.4 - 76.8 % Final  . LYMPH% 06/07/2015 4.7* 14.0 - 49.7 % Final  . MONO% 06/07/2015 11.1  0.0 - 14.0 % Final  . EOS% 06/07/2015 0.4  0.0 - 7.0 % Final  . BASO% 06/07/2015 0.4  0.0 - 2.0 % Final  . Sodium 06/07/2015 131* 136 - 145 mEq/L Final  . Potassium 06/07/2015 3.9  3.5 - 5.1 mEq/L Final  . Chloride 06/07/2015 93* 98 - 109 mEq/L Final  . CO2 06/07/2015 26  22 - 29 mEq/L Final  . Glucose 06/07/2015 127  70 - 140 mg/dl Final   Glucose reference range is for nonfasting patients. Fasting glucose reference range is 70- 100.  Marland Kitchen BUN 06/07/2015 20.6  7.0 - 26.0 mg/dL Final  . Creatinine 06/07/2015 0.9  0.6 - 1.1 mg/dL Final  . Total Bilirubin 06/07/2015 <0.30  0.20 - 1.20 mg/dL Final  . Alkaline Phosphatase 06/07/2015 86  40 - 150 U/L Final  . AST 06/07/2015 23  5 - 34 U/L Final  . ALT 06/07/2015 29  0 - 55 U/L Final  . Total Protein 06/07/2015 7.7  6.4 - 8.3  g/dL Final  . Albumin 06/07/2015 2.6* 3.5 - 5.0 g/dL Final  . Calcium 06/07/2015 9.6  8.4 - 10.4 mg/dL Final  . Anion Gap 06/07/2015 12* 3 - 11 mEq/L Final  . EGFR 06/07/2015 70* >90 ml/min/1.73 m2 Final   eGFR is calculated using the CKD-EPI Creatinine Equation (2009)  . RBC 06/07/2015 2.66* 3.70 - 5.45 10e6/uL Final  . Retic % 06/07/2015 3.23* 0.70 - 2.10 % Final  . Retic Ct Abs 06/07/2015 85.92  33.70 - 90.70 10e3/uL Final  . Immature Retic Fract 06/07/2015 17.80* 1.60 - 10.00 % Final  . LDH 06/07/2015 143  125 - 245 U/L Final     RADIOGRAPHIC STUDIES: Ct Chest W Contrast  06/09/2015  CLINICAL DATA:  Cervical cancer diagnosed 12/15. Just finished chemotherapy. Right-sided ureteric stent. Status post radiation therapy. EXAM: CT CHEST, ABDOMEN, AND PELVIS WITH CONTRAST TECHNIQUE: Multidetector CT imaging of the chest, abdomen and pelvis was performed following the standard protocol during bolus administration of intravenous contrast. CONTRAST:  156m OMNIPAQUE IOHEXOL 300 MG/ML  SOLN COMPARISON:  03/23/2015 abdominal pelvic CT. No prior chest CT. PET of 11/04/2014 is reviewed. FINDINGS: CT CHEST Mediastinum/Nodes: No supraclavicular adenopathy. Normal heart size, without pericardial effusion. No central pulmonary embolism, on this non-dedicated study. No mediastinal or hilar adenopathy. Lungs/Pleura: No pleural fluid.  Clear lungs. Musculoskeletal: No acute osseous abnormality. CT ABDOMEN AND PELVIS Hepatobiliary: Normal liver. Cholecystectomy, without biliary ductal dilatation. Pancreas: Mild pancreatic atrophy involving the head and uncinate process. No duct dilatation or acute inflammation. Spleen: Normal in size, without focal abnormality. Adrenals/Urinary Tract: Normal adrenal glands. Mild left-sided caliectasis is unchanged. No hydroureter. Interpolar left renal lesion is too small to characterize. right renal atrophy. Right perinephric fluid collection is small and similar. Right ureteric  stent in place. This terminates in the urinary bladder. Moderate bladder wall thickening with surrounding edema. This is superimposed upon underdistention. significantly increased. Stomach/Bowel: Normal stomach, without wall thickening. Colonic stool burden suggests constipation. Normal terminal ileum and appendix. Slightly improved distal ileal wall thickening, including on image 102/series 2. Small bowel otherwise unremarkable. Vascular/Lymphatic: Normal caliber of the aorta and branch vessels. No retroperitoneal or retrocrural adenopathy. Right inguinal node measures 1.3 cm on image 110/series 2. 1.4 cm on the prior. Reproductive: Endometrial fluid is decreased. Decreased endometrial gas. Calcifications within and along the endometrium are likely related to fibroids. Residual soft tissue fullness within the uterine cervix with ill definition of surrounding fat planes. Felt to be similar, including on image 108/series 2. Air identified in the region of the vaginal fornices and endocervix including on images 112 and 109 of series 2 respectively. Similar. The vaginal fornix is intimately associated with the rectal wall, including on image 111/series 2. Right ovarian follicle or cyst. Other: No significant free fluid. Musculoskeletal: Sacroiliac joint sclerosis is likely degenerative. Mild superior endplate irregularity at L3 is chronic. IMPRESSION: CT CHEST IMPRESSION No acute process or evidence of metastatic disease in the chest. CT ABDOMEN AND PELVIS IMPRESSION 1. Similar soft tissue fullness within the lower uterine segment and cervix. Decreased endometrial fluid and gas, likely secondary. Gas within the endocervix and vaginal fornices is indeterminate. This could relate to necrosis. Especially given the close association with the rectum, fistulous communication cannot be excluded. 2. Right inguinal adenopathy is decreased minimally and suspicious. 3. Similar right perinephric fluid collection with renal atrophy  and ureteric stent in place. 4. Progressive bladder wall thickening, possibly related to radiation induced cystitis. 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 5. Similar mild left caliectasis, indeterminate. 6.  Possible constipation. Electronically Signed   By: Abigail Miyamoto M.D.   On: 06/09/2015 10:04   Ct Abdomen Pelvis W Contrast  06/09/2015  CLINICAL DATA:  Cervical cancer diagnosed 12/15. Just finished chemotherapy. Right-sided ureteric stent. Status post radiation therapy. EXAM: CT CHEST, ABDOMEN, AND PELVIS WITH CONTRAST TECHNIQUE: Multidetector CT imaging of the chest, abdomen and pelvis was performed following the standard protocol during bolus administration of intravenous contrast. CONTRAST:  153m OMNIPAQUE IOHEXOL 300 MG/ML  SOLN COMPARISON:  03/23/2015 abdominal pelvic CT. No prior chest CT. PET of 11/04/2014 is reviewed. FINDINGS: CT CHEST Mediastinum/Nodes: No supraclavicular adenopathy. Normal heart size, without pericardial effusion. No central pulmonary embolism, on this non-dedicated study. No mediastinal or hilar adenopathy. Lungs/Pleura: No pleural fluid.  Clear lungs. Musculoskeletal: No acute osseous abnormality. CT ABDOMEN AND PELVIS Hepatobiliary: Normal liver. Cholecystectomy, without biliary ductal dilatation. Pancreas: Mild pancreatic atrophy involving the head and uncinate process. No duct dilatation or acute inflammation. Spleen: Normal in size, without focal abnormality. Adrenals/Urinary Tract: Normal adrenal glands. Mild left-sided caliectasis is unchanged. No hydroureter. Interpolar left renal lesion is too small to characterize. right renal atrophy. Right perinephric fluid collection is small and similar. Right ureteric stent in place. This terminates in the urinary bladder. Moderate bladder wall thickening with surrounding edema. This  is superimposed upon underdistention. significantly increased. Stomach/Bowel: Normal stomach, without wall thickening. Colonic stool burden  suggests constipation. Normal terminal ileum and appendix. Slightly improved distal ileal wall thickening, including on image 102/series 2. Small bowel otherwise unremarkable. Vascular/Lymphatic: Normal caliber of the aorta and branch vessels. No retroperitoneal or retrocrural adenopathy. Right inguinal node measures 1.3 cm on image 110/series 2. 1.4 cm on the prior. Reproductive: Endometrial fluid is decreased. Decreased endometrial gas. Calcifications within and along the endometrium are likely related to fibroids. Residual soft tissue fullness within the uterine cervix with ill definition of surrounding fat planes. Felt to be similar, including on image 108/series 2. Air identified in the region of the vaginal fornices and endocervix including on images 112 and 109 of series 2 respectively. Similar. The vaginal fornix is intimately associated with the rectal wall, including on image 111/series 2. Right ovarian follicle or cyst. Other: No significant free fluid. Musculoskeletal: Sacroiliac joint sclerosis is likely degenerative. Mild superior endplate irregularity at L3 is chronic. IMPRESSION: CT CHEST IMPRESSION No acute process or evidence of metastatic disease in the chest. CT ABDOMEN AND PELVIS IMPRESSION 1. Similar soft tissue fullness within the lower uterine segment and cervix. Decreased endometrial fluid and gas, likely secondary. Gas within the endocervix and vaginal fornices is indeterminate. This could relate to necrosis. Especially given the close association with the rectum, fistulous communication cannot be excluded. 2. Right inguinal adenopathy is decreased minimally and suspicious. 3. Similar right perinephric fluid collection with renal atrophy and ureteric stent in place. 4. Progressive bladder wall thickening, possibly related to radiation induced cystitis. 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 5. Similar mild left caliectasis, indeterminate. 6.  Possible constipation. Electronically  Signed   By: Abigail Miyamoto M.D.   On: 06/09/2015 10:04    ASSESSMENT/PLAN:    Anemia due to acute blood loss Patient received her last Avastin therapy on 05/05/2015.  Patient reports increased vaginal bleeding with bright red blood within the past few days.  She also reports some gushing of clear odorous vaginal fluid as well.  She states that she is sometimes using 2 sanitary pads at once in changing the pads every hour.  She reports increased fatigue/weakness and shortness of breath.  Her maximum fever has been 99.8.  Exam today revealed no copious vaginal bleeding or fluid discharge with brief pelvic exam.  Wet prep.  Vaginal swab was obtained and pending results.  However, patient was noted to frequently be getting up to go to the bathroom and change her pads area.  Blood counts obtained today reveal a WBC of 12.3, ANC 10.3, hemoglobin 8.2, platelet count 611.  Patient will receive IV fluid rehydration today; and return tomorrow morning to receive 2 units packed red blood cells transfusion.  Reviewed all findings in detail with Dr. [name] this afternoon.  Patient will be prescribed Flagyl 500 mg 3 times per day for treatment of probable recurrent vaginal infection.  Patient is scheduled to return for a restaging CT on Thursday, 06/09/2015.  She will also follow-up with Joylene John, nurse practitioner and Dr. [name] as needed.  Patient is scheduled to return on 06/23/2015 for labs and a follow-up visit with [name].  Cancer associated pain Patient reports increased/progressive pain in the entire pelvic/vaginal region.  She feels it is hard to lie directly on her tailbone.  She typically lies on her side to avoid the pain.  She takes Ultram as directed.  Cervical cancer [facility]) Patient received her last Avastin therapy on 05/05/2015.  Patient reports increased vaginal bleeding with bright red blood within the past few days.  She also reports some gushing of clear odorous vaginal fluid  as well.  She states that she is sometimes using 2 sanitary pads at once in changing the pads every hour.  She reports increased fatigue/weakness and shortness of breath.  Her maximum fever has been 99.8.  Exam today revealed no copious vaginal bleeding or fluid discharge with brief pelvic exam.  Wet prep.  Vaginal swab was obtained and pending results.  However, patient was noted to frequently be getting up to go to the bathroom and change her pads area.  Blood counts obtained today reveal a WBC of 12.3, ANC 10.3, hemoglobin 8.2, platelet count 611.  Patient will receive IV fluid rehydration today; and return tomorrow morning to receive 2 units packed red blood cells transfusion.  Reviewed all findings in detail with Dr. [name] this afternoon.  Patient will be prescribed Flagyl 500 mg 3 times per day for treatment of probable recurrent vaginal infection.  Patient is scheduled to return for a restaging CT on Thursday, 06/09/2015.  She will also follow-up with Joylene John, nurse practitioner and Dr. [name] as needed.  Patient is scheduled to return on 06/23/2015 for labs and a follow-up visit with [name].  Dehydration Patient reports minimal appetite and poor oral intake.  She feels dehydrated today.  Sodium was 131 and chloride was 93.  Patient received approximately 1500 mL spiral saline IV fluid rehydration today.  She will return tomorrow to receive blood transfusion.  She was encouraged to push fluids at home as well.  Hypoalbuminemia due to protein-calorie malnutrition (HCC) Abdomen has decreased to 2.6.  Patient was crushed push protein in her diet is much as possible.  Patient stated understanding of all instructions; and was in agreement with this plan of care. The patient knows to call the clinic with any problems, questions or concerns.   Review/collaboration with Dr. [name] and Marko Plume regarding all aspects of patient's visit today.   Total time spent with patient was 40  minutes;  with greater than 75 percent of that time spent in face to face counseling regarding patient's symptoms,  and coordination of care and follow up.  Disclaimer:This dictation was prepared with Dragon/digital dictation along with [employer]. Any transcriptional errors that result from this process are unintentional.  Drue Second, NP 06/09/2015

## 2015-06-09 NOTE — Assessment & Plan Note (Signed)
Patient reports increased/progressive pain in the entire pelvic/vaginal region.  She feels it is hard to lie directly on her tailbone.  She typically lies on her side to avoid the pain.  She takes Ultram as directed.

## 2015-06-09 NOTE — Telephone Encounter (Signed)
Notified Dr. Charlies Constable nurse new CT results in. Pt to be contacted by Dr. [name] or Joylene John for further follow up. Pt confirmed appt with [name] 1/26.

## 2015-06-09 NOTE — Assessment & Plan Note (Signed)
Patient reports minimal appetite and poor oral intake.  She feels dehydrated today.  Sodium was 131 and chloride was 93.  Patient received approximately 1500 mL spiral saline IV fluid rehydration today.  She will return tomorrow to receive blood transfusion.  She was encouraged to push fluids at home as well.

## 2015-06-10 ENCOUNTER — Other Ambulatory Visit: Payer: Self-pay | Admitting: Oncology

## 2015-06-13 ENCOUNTER — Telehealth: Payer: Self-pay

## 2015-06-13 NOTE — Telephone Encounter (Signed)
Incoming call from a patient of Dr Terrence Dupont Rossi's , patient states she is experiencing increased "bladder" incontinence and is wondering if she should follow up with Dr Junious Silk [facility]: [phone] , fax [phone]  from urology . Updated Joylene John ,APNP , orders received to fax the results from the patient's recent CT scan of her chest , abdomen and pelvis obtained on 06/09/2015 . Orders faxed , fax transmission log report received back OK . Patient instructed to contact Dr Junious Silk office to schedule and appointment , patient agreed with plan and will contact his office , denies further questions at this time.

## 2015-06-16 ENCOUNTER — Encounter (HOSPITAL_BASED_OUTPATIENT_CLINIC_OR_DEPARTMENT_OTHER): Payer: Self-pay | Admitting: *Deleted

## 2015-06-16 ENCOUNTER — Other Ambulatory Visit: Payer: Self-pay | Admitting: Urology

## 2015-06-16 NOTE — Progress Notes (Signed)
NPO AFTER MN.  ARRIVE AT 0700.  CURRENT LAB RESULTS IN CHART AND EPIC.  

## 2015-06-17 ENCOUNTER — Other Ambulatory Visit: Payer: Self-pay | Admitting: Urology

## 2015-06-20 ENCOUNTER — Other Ambulatory Visit: Payer: Self-pay | Admitting: Urology

## 2015-06-20 NOTE — H&P (Signed)
Reason For Visit        History of Present Illness F/u - Onc Dr. Marko Webster; Gyn Onc Dr. 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 She has been under the care of Dr. Loralee Webster and Dr. Polly Webster receiving treatment with whole pelvic radiation therapy with chemosensitization completed April 2016. She developed fever and a septic like picture. CT scan revealed right hydronephrosis and a large right perinephric fluid collection which had been slowly progressing since January 2016. She underwent cystoscopy right retrograde pyelogram and right ureteral stent placement with dilation of the ureter after retrograde revealed a strictured 2-3 cm distal right ureter as it entered the bladder. On discharge her kidney function did return back to baseline with a BUN of 6 and creatinine 0.94.  -Aug 2016 -- Dr Stacy Webster receiving CDDP/taxol/avastin - BUN/Cr were 11/1.     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 I spoke with Dr. Orene Webster. She forwarded path to Stacy Webster for review.     3-right perinephric fluid collection-May 2016-as above patient had a right perinephric drain placed when she was septic. The drainage was scant and the fluid collection for the most part resolved on follow-up imaging. Therefore the drain was removed prior to discharge. CT imaging on 07/11 showed stable mild hydronephrosis and stable mild perinephric fluid collection.          January 2017 interval history  Patient returns and continued management of a right ureteral stricture and bladder wall thickening. I reviewed Epic notes -- Patient underwent a D&C October 2016 with Dr. Denman Webster. She completed Avastin Dec 2016.     January 2017 CT chest abdomen and pelvis images reviewed-revealed stable small right perinephric fluid, right renal atrophy without significant hydro-, right ureteral stent in good position. Impression noted of increased  bladder wall thickening and edema but the bladder was decompressed compared to prior CT. there was air in the vaginal vault and right fornix.    I discussed with Dr. Orene Webster in October 2016 the [facility] review of bladder biopsies revealed CIS versus atypia from radiation. Recommendation was to continue to monitor.    Today, she complains of incontinence almost continuous. She doesn't really get an urge to void and hasn't developed a good stream in a week or 2. Worse with sitting to staning she'll lose a large volume and then stay dry for only minutes at a time before she feels like she's leaking again. A couple months ago she had some blood and fluid per vagina after her procedure but that seemed to settle down. Over the last couple of weeks she's had more continuous incontinence. She's had to wear a diaper and its very wet. She changes them frequently. No gross hematuria or clots.       Past Medical History Problems  1. History of asthma (Z87.09) 2. History of cervical cancer (Z85.41) 3. History of esophageal reflux (Z87.19)  Surgical History Problems  1. History of Cesarean Section 2. History of Cholecystectomy 3. History of Cystoscopy With Fulguration Minor Lesion (Under 72mm) 4. History of Cystoscopy With Insertion Of Ureteral Stent Right 5. History of Cystoscopy With Insertion Of Ureteral Stent Right 6. History of Cystoscopy With Ureteroscopy Right 7. History of Cystourethroscopy W/ Ureteroscopy W/ Tx Of Ureteral Strict 8. History of Ear Surgery 9. History of Tonsillectomy With Adenoidectomy  Current Meds 1. Dexamethasone 4 MG Oral Tablet;  Therapy: (Recorded:22Aug2016) to Recorded 2. Docusate Sodium  100 MG Oral Capsule;  Therapy: (Recorded:22Aug2016) to Recorded 3. Hemocyte 324 (106 Fe) MG Oral Tablet;  Therapy: (Recorded:22Aug2016) to Recorded 4. LORazepam 1 MG Oral Tablet;  Therapy: (Recorded:01Jun2016) to Recorded 5. Magnesium TABS;  Therapy: (Recorded:22Aug2016) to  Recorded 6. MetroNIDAZOLE CAPS;  Therapy: (Recorded:18Jan2017) to Recorded 7. Nutritional Drink LIQD;  Therapy: (Recorded:22Aug2016) to Recorded 8. Pantoprazole Sodium 40 MG Oral Tablet Delayed Release;  Therapy: (Recorded:22Aug2016) to Recorded 9. Potassium Chloride ER 10 MEQ Oral Capsule Extended Release;  Therapy: (Recorded:22Aug2016) to Recorded 10. TraMADol HCl TABS;   Therapy: (Recorded:18Jan2017) to Recorded  Allergies Medication  1. Codeine Derivatives 2. hydrocodone 3. Morphine Derivatives  Family History Problems  1. No significant family history : Mother, Father  Social History Problems  1. Denied: History of Alcohol use 2. Married 3. Never smoker 4. Occupation   Pharmacist, hospital  Vitals Vital Signs [Data Includes: Last 1 Day]  Recorded: [id number] 10:26AM  Blood Pressure: 122 / 83 Temperature: 98.3 F Heart Rate: 116  Physical Exam Constitutional: Well nourished and well developed . No acute distress.  Genitourinary:  Chaperone Present: Stacy Webster.  Examination of the external genitalia shows vulvar atrophy, but normal female external genitalia and no lesions. The urethra is normal in appearance and not tender. There is no urethral mass. Vaginal exam demonstrates no abnormalities. The bladder is non tender and not distended. The anterior vaginal wall is rough and granulated. The anus is normal on inspection. The perineum is normal on inspection. A catheter was inserted per urethra and the bladder was empty. I irrigated her bladder and a copious amount of fluid came out around the catheter but also likely per vagina. Some fluid did drain the catheter. It was sent for culture. The vaginal canal is quite tight so is difficult to examine in the office.  Neuro/Psych:. Mood and affect are appropriate.    Results/Data  Old records or history reviewed: path, epic notes.  The following images/tracing/specimen were independently visualized:  CT Jan and Oct.  PVR: Cathed PVR 000  ml.    Assessment Assessed  1. Urinary incontinence with continuous leakage (N39.45) 2. Ureteral stricture (N13.5)  Plan Ureteral stricture  1. Follow-up Office  Follow-up  Status: Canceled - Date of Service 2. Follow-up Schedule Surgery Office  Follow-up  Status: Hold For - Appointment   Requested for: [id number] Urinary incontinence with continuous leakage  3. Cath for PVR/Specimen; Status:Complete;   Done: [id number] 4. URINE CULTURE; Status:Hold For - Specimen/Data Collection,Appointment; Requested  [id number];   Discussion/Summary right ureteral stricture - Will plan cysto, stent exchange, bilateral retrograde pyelograms poss bladder biopsy, URS and biopsy.     incontinence - PVR 0 ml. I suspect she has at least a vesicovaginal fistula. We'll evaluate as above to r/o a vesicovaginal or vesicoureteral fistula. We'll give her some Pyridium preop and instill some blue dye to look for leakage.    Bladder wall edema and wall thickening-we'll consider repeat biopsies.      Signatures Electronically signed by : Festus Aloe, M.D.; Jun 15 2015 11:25AM EST  Add: Urine cx grew 20k mixed.

## 2015-06-21 ENCOUNTER — Encounter (HOSPITAL_BASED_OUTPATIENT_CLINIC_OR_DEPARTMENT_OTHER): Payer: Self-pay | Admitting: *Deleted

## 2015-06-21 ENCOUNTER — Ambulatory Visit (HOSPITAL_BASED_OUTPATIENT_CLINIC_OR_DEPARTMENT_OTHER)
Admission: RE | Admit: 2015-06-21 | Discharge: 2015-06-21 | Disposition: A | Discharge status: Home / Self Care | Payer: BC Managed Care – PPO | Source: Ambulatory Visit | Attending: Urology | Admitting: Urology

## 2015-06-21 ENCOUNTER — Ambulatory Visit (HOSPITAL_BASED_OUTPATIENT_CLINIC_OR_DEPARTMENT_OTHER): Payer: BC Managed Care – PPO | Admitting: Anesthesiology

## 2015-06-21 ENCOUNTER — Encounter (HOSPITAL_BASED_OUTPATIENT_CLINIC_OR_DEPARTMENT_OTHER)
Admission: RE | Disposition: A | Discharge status: Home / Self Care | Payer: Self-pay | Source: Ambulatory Visit | Attending: Urology

## 2015-06-21 DIAGNOSIS — J45909 Unspecified asthma, uncomplicated: Secondary | ICD-10-CM | POA: Insufficient documentation

## 2015-06-21 DIAGNOSIS — R32 Unspecified urinary incontinence: Secondary | ICD-10-CM | POA: Insufficient documentation

## 2015-06-21 DIAGNOSIS — Z79899 Other long term (current) drug therapy: Secondary | ICD-10-CM | POA: Diagnosis not present

## 2015-06-21 DIAGNOSIS — Z8541 Personal history of malignant neoplasm of cervix uteri: Secondary | ICD-10-CM | POA: Insufficient documentation

## 2015-06-21 DIAGNOSIS — N820 Vesicovaginal fistula: Secondary | ICD-10-CM | POA: Diagnosis not present

## 2015-06-21 DIAGNOSIS — Z923 Personal history of irradiation: Secondary | ICD-10-CM | POA: Insufficient documentation

## 2015-06-21 DIAGNOSIS — N135 Crossing vessel and stricture of ureter without hydronephrosis: Secondary | ICD-10-CM | POA: Insufficient documentation

## 2015-06-21 DIAGNOSIS — K219 Gastro-esophageal reflux disease without esophagitis: Secondary | ICD-10-CM | POA: Insufficient documentation

## 2015-06-21 DIAGNOSIS — D649 Anemia, unspecified: Secondary | ICD-10-CM | POA: Insufficient documentation

## 2015-06-21 DIAGNOSIS — N3080 Other cystitis without hematuria: Secondary | ICD-10-CM | POA: Insufficient documentation

## 2015-06-21 DIAGNOSIS — N1330 Unspecified hydronephrosis: Secondary | ICD-10-CM | POA: Insufficient documentation

## 2015-06-21 HISTORY — DX: Other specified postprocedural states: R11.2

## 2015-06-21 HISTORY — PX: CYSTOSCOPY/RETROGRADE/URETEROSCOPY: SHX5316

## 2015-06-21 HISTORY — DX: Other constipation: K59.09

## 2015-06-21 HISTORY — DX: Unspecified urinary incontinence: R32

## 2015-06-21 HISTORY — DX: Other specified postprocedural states: Z98.890

## 2015-06-21 LAB — POCT HEMOGLOBIN-HEMACUE: HEMOGLOBIN: 10.7 g/dL — AB (ref 12.0–15.0)

## 2015-06-21 SURGERY — CYSTOSCOPY/RETROGRADE/URETEROSCOPY
Anesthesia: General

## 2015-06-21 MED ORDER — DEXAMETHASONE SODIUM PHOSPHATE 4 MG/ML IJ SOLN
INTRAMUSCULAR | Status: DC | PRN
  Administered 2015-06-21: 10 mg via INTRAVENOUS

## 2015-06-21 MED ORDER — PROMETHAZINE HCL 25 MG/ML IJ SOLN
6.2500 mg | INTRAMUSCULAR | Status: DC | PRN
  Filled 2015-06-21: qty 1

## 2015-06-21 MED ORDER — FENTANYL CITRATE (PF) 100 MCG/2ML IJ SOLN
INTRAMUSCULAR | Status: AC
  Filled 2015-06-21: qty 2

## 2015-06-21 MED ORDER — CEFAZOLIN SODIUM-DEXTROSE 2-3 GM-% IV SOLR
INTRAVENOUS | Status: AC
  Filled 2015-06-21: qty 50

## 2015-06-21 MED ORDER — IOHEXOL 350 MG/ML SOLN
INTRAVENOUS | Status: DC | PRN
  Administered 2015-06-21: 17 mL via URETHRAL

## 2015-06-21 MED ORDER — LIDOCAINE HCL (CARDIAC) 20 MG/ML IV SOLN
INTRAVENOUS | Status: DC | PRN
  Administered 2015-06-21: 50 mg via INTRAVENOUS

## 2015-06-21 MED ORDER — LIDOCAINE HCL (CARDIAC) 20 MG/ML IV SOLN
INTRAVENOUS | Status: AC
  Filled 2015-06-21: qty 5

## 2015-06-21 MED ORDER — PHENAZOPYRIDINE HCL 200 MG PO TABS
200.0000 mg | ORAL_TABLET | Freq: Once | ORAL | Status: AC
  Administered 2015-06-21: 200 mg via ORAL
  Filled 2015-06-21: qty 1

## 2015-06-21 MED ORDER — CEFAZOLIN SODIUM-DEXTROSE 2-3 GM-% IV SOLR
2.0000 g | Freq: Once | INTRAVENOUS | Status: AC
  Administered 2015-06-21: 2 g via INTRAVENOUS
  Filled 2015-06-21: qty 50

## 2015-06-21 MED ORDER — DEXAMETHASONE SODIUM PHOSPHATE 10 MG/ML IJ SOLN
INTRAMUSCULAR | Status: AC
  Filled 2015-06-21: qty 1

## 2015-06-21 MED ORDER — TRAMADOL HCL 50 MG PO TABS
ORAL_TABLET | ORAL | Status: AC
  Filled 2015-06-21: qty 1

## 2015-06-21 MED ORDER — PHENAZOPYRIDINE HCL 100 MG PO TABS
ORAL_TABLET | ORAL | Status: AC
  Filled 2015-06-21: qty 2

## 2015-06-21 MED ORDER — FENTANYL CITRATE (PF) 100 MCG/2ML IJ SOLN
INTRAMUSCULAR | Status: DC | PRN
  Administered 2015-06-21 (×6): 50 ug via INTRAVENOUS

## 2015-06-21 MED ORDER — LACTATED RINGERS IV SOLN
INTRAVENOUS | Status: DC
  Administered 2015-06-21: 08:00:00 via INTRAVENOUS
  Filled 2015-06-21: qty 1000

## 2015-06-21 MED ORDER — STERILE WATER FOR IRRIGATION IR SOLN
Status: DC | PRN
  Administered 2015-06-21 (×3): 3000 mL

## 2015-06-21 MED ORDER — PHENYLEPHRINE 40 MCG/ML (10ML) SYRINGE FOR IV PUSH (FOR BLOOD PRESSURE SUPPORT)
PREFILLED_SYRINGE | INTRAVENOUS | Status: AC
  Filled 2015-06-21: qty 10

## 2015-06-21 MED ORDER — FENTANYL CITRATE (PF) 100 MCG/2ML IJ SOLN
25.0000 ug | INTRAMUSCULAR | Status: DC | PRN
  Filled 2015-06-21: qty 1

## 2015-06-21 MED ORDER — SCOPOLAMINE 1 MG/3DAYS TD PT72
1.0000 | MEDICATED_PATCH | TRANSDERMAL | Status: DC
Start: 2015-06-21 — End: 2015-06-21
  Administered 2015-06-21: 1.5 mg via TRANSDERMAL
  Filled 2015-06-21: qty 1

## 2015-06-21 MED ORDER — DEXTROSE 5 % IV SOLN: 1.0000 g | Freq: Three times a day (TID) | INTRAVENOUS | Status: DC

## 2015-06-21 MED ORDER — TRAMADOL HCL 50 MG PO TABS
50.0000 mg | ORAL_TABLET | Freq: Four times a day (QID) | ORAL | Status: DC | PRN
  Administered 2015-06-21: 50 mg via ORAL
  Filled 2015-06-21: qty 1

## 2015-06-21 MED ORDER — SCOPOLAMINE 1 MG/3DAYS TD PT72
MEDICATED_PATCH | TRANSDERMAL | Status: AC
  Filled 2015-06-21: qty 1

## 2015-06-21 MED ORDER — PROPOFOL 10 MG/ML IV BOLUS
INTRAVENOUS | Status: AC
  Filled 2015-06-21: qty 20

## 2015-06-21 MED ORDER — PROPOFOL 10 MG/ML IV BOLUS
INTRAVENOUS | Status: DC | PRN
  Administered 2015-06-21: 160 mg via INTRAVENOUS
  Administered 2015-06-21: 50 mg via INTRAVENOUS

## 2015-06-21 SURGICAL SUPPLY — 40 items
ADAPTER CATH URET PLST 4-6FR (CATHETERS) IMPLANT
BAG DRAIN URO-CYSTO SKYTR STRL (DRAIN) ×4 IMPLANT
BAG URINE LEG 19OZ MD ST LTX (BAG) ×4 IMPLANT
BASKET LASER NITINOL 1.9FR (BASKET) IMPLANT
BASKET STNLS GEMINI 4WIRE 3FR (BASKET) IMPLANT
BASKET ZERO TIP NITINOL 2.4FR (BASKET) IMPLANT
CANISTER SUCT LVC 12 LTR MEDI- (MISCELLANEOUS) IMPLANT
CATH FOLEY 2WAY SLVR  5CC 16FR (CATHETERS) ×2
CATH FOLEY 2WAY SLVR 5CC 16FR (CATHETERS) ×2 IMPLANT
CATH INTERMIT  6FR 70CM (CATHETERS) ×4 IMPLANT
CATH URET 5FR 28IN CONE TIP (BALLOONS)
CATH URET 5FR 28IN OPEN ENDED (CATHETERS) IMPLANT
CATH URET 5FR 70CM CONE TIP (BALLOONS) IMPLANT
CATH URET DUAL LUMEN 6-10FR 50 (CATHETERS) IMPLANT
CLOTH BEACON ORANGE TIMEOUT ST (SAFETY) ×4 IMPLANT
ELECT REM PT RETURN 9FT ADLT (ELECTROSURGICAL) ×4
ELECTRODE REM PT RTRN 9FT ADLT (ELECTROSURGICAL) ×2 IMPLANT
GLOVE BIO SURGEON STRL SZ7.5 (GLOVE) ×4 IMPLANT
GOWN STRL REUS W/ TWL LRG LVL3 (GOWN DISPOSABLE) ×2 IMPLANT
GOWN STRL REUS W/ TWL XL LVL3 (GOWN DISPOSABLE) ×2 IMPLANT
GOWN STRL REUS W/TWL LRG LVL3 (GOWN DISPOSABLE) ×2
GOWN STRL REUS W/TWL XL LVL3 (GOWN DISPOSABLE) ×2
GUIDEWIRE 0.038 PTFE COATED (WIRE) IMPLANT
GUIDEWIRE ANG ZIPWIRE 038X150 (WIRE) IMPLANT
GUIDEWIRE STR DUAL SENSOR (WIRE) ×8 IMPLANT
KIT BALLIN UROMAX 15FX10 (LABEL) IMPLANT
KIT BALLN UROMAX 15FX4 (MISCELLANEOUS) IMPLANT
KIT BALLN UROMAX 26 75X4 (MISCELLANEOUS)
KIT ROOM TURNOVER WOR (KITS) ×4 IMPLANT
MANIFOLD NEPTUNE II (INSTRUMENTS) ×4 IMPLANT
NEEDLE HYPO 22GX1.5 SAFETY (NEEDLE) IMPLANT
NS IRRIG 500ML POUR BTL (IV SOLUTION) IMPLANT
PACK CYSTO (CUSTOM PROCEDURE TRAY) ×4 IMPLANT
SET HIGH PRES BAL DIL (LABEL)
SHEATH ACCESS URETERAL 38CM (SHEATH) IMPLANT
STENT URET 6FRX24 CONTOUR (STENTS) ×8 IMPLANT
SYRINGE IRR TOOMEY STRL 70CC (SYRINGE) IMPLANT
TUBE CONNECTING 12'X1/4 (SUCTIONS) ×1
TUBE CONNECTING 12X1/4 (SUCTIONS) ×3 IMPLANT
WATER STERILE IRR 3000ML UROMA (IV SOLUTION) ×12 IMPLANT

## 2015-06-21 NOTE — Transfer of Care (Signed)
Immediate Anesthesia Transfer of Care Note  Patient: Stacy Webster  Procedure(s) Performed: Procedure(s): CYSTOSCOPY; EXAM UNDER ANESTHESIA; BLADDER BIOPSIES WITH FULGARATION; BILATERAL RETROGRADE PYLOGRAM; RIGHT URETERAL STENT EXCHANGE AND LEFT URETERAL STENT PLACEMENT (Bilateral)  Patient Location: PACU  Anesthesia Type:General  Level of Consciousness: awake, alert  and oriented  Airway & Oxygen Therapy: Patient Spontanous Breathing and Patient connected to nasal cannula oxygen  Post-op Assessment: Report given to RN  Post vital signs: Reviewed and stable  Last Vitals: 144/88, 82,10,99% Filed Vitals:   06/21/15 0723  BP: 151/85  Pulse: 99  Temp: 36.9 C  Resp: 16    Complications: No apparent anesthesia complications

## 2015-06-21 NOTE — Discharge Instructions (Signed)
Foley Catheter Care, Adult A Foley catheter is a soft, flexible tube. This tube is placed into your bladder to drain pee (urine). If you go home with this catheter in place, follow the instructions below. TAKING CARE OF THE CATHETER 1. Wash your hands with soap and water. 2. Put soap and water on a clean washcloth.  Clean the skin where the tube goes into your body.  Clean away from the tube site.  Never wipe toward the tube.  Clean the area using a circular motion.  Remove all the soap. Pat the area dry with a clean towel. For males, reposition the skin that covers the end of the penis (foreskin). 3. Attach the tube to your leg with tape or a leg strap. Do not stretch the tube tight. If you are using tape, remove any stickiness left behind by past tape you used. 4. Keep the drainage bag below your hips. Keep it off the floor. 5. Check your tube during the day. Make sure it is working and draining. Make sure the tube does not curl, twist, or bend. 6. Do not pull on the tube or try to take it out. TAKING CARE OF THE DRAINAGE BAGS You will have a large overnight drainage bag and a small leg bag. You may wear the overnight bag any time. Never wear the small bag at night. Follow the directions below. Emptying the Drainage Bag Empty your drainage bag when it is  - full or at least 2-3 times a day. 1. Wash your hands with soap and water. 2. Keep the drainage bag below your hips. 3. Hold the dirty bag over the toilet or clean container. 4. Open the pour spout at the bottom of the bag. Empty the pee into the toilet or container. Do not let the pour spout touch anything. 5. Clean the pour spout with a gauze pad or cotton ball that has rubbing alcohol on it. 6. Close the pour spout. 7. Attach the bag to your leg with tape or a leg strap. 8. Wash your hands well. Changing the Drainage Bag Change your bag once a month or sooner if it starts to smell or look dirty.  1. Wash your hands with soap  and water. 2. Pinch the rubber tube so that pee does not spill out. 3. Disconnect the catheter tube from the drainage tube at the connection valve. Do not let the tubes touch anything. 4. Clean the end of the catheter tube with an alcohol wipe. Clean the end of a the drainage tube with a different alcohol wipe. 5. Connect the catheter tube to the drainage tube of the clean drainage bag. 6. Attach the new bag to the leg with tape or a leg strap. Avoid attaching the new bag too tightly. 7. Wash your hands well. Cleaning the Drainage Bag 1. Wash your hands with soap and water. 2. Wash the bag in warm, soapy water. 3. Rinse the bag with warm water. 4. Fill the bag with a mixture of white vinegar and water (1 cup vinegar to 1 quart warm water [.2 liter vinegar to 1 liter warm water]). Close the bag and soak it for 30 minutes in the solution. 5. Rinse the bag with warm water. 6. Hang the bag to dry with the pour spout open and hanging downward. 7. Store the clean bag (once it is dry) in a clean plastic bag. 8. Wash your hands well. PREVENT INFECTION  Wash your hands before and after touching your tube.  Take showers every day. Wash the skin where the tube enters your body. Do not take baths. Replace wet leg straps with dry ones, if this applies.  Do not use powders, sprays, or lotions on the genital area. Only use creams, lotions, or ointments as told by your doctor.  For females, wipe from front to back after going to the bathroom.  Drink enough fluids to keep your pee clear or pale yellow unless you are told not to have too much fluid (fluid restriction).  Do not let the drainage bag or tubing touch or lie on the floor.  Wear cotton underwear to keep the area dry. GET HELP IF:  Your pee is cloudy or smells unusually bad.  Your tube becomes clogged.  You are not draining pee into the bag or your bladder feels full.  Your tube starts to leak. GET HELP RIGHT AWAY IF:  You have  pain, puffiness (swelling), redness, or yellowish-white fluid (pus) where the tube enters the body.  You have pain in the belly (abdomen), legs, lower back, or bladder.  You have a fever.  You see blood fill the tube, or your pee is pink or red.  You feel sick to your stomach (nauseous), throw up (vomit), or have chills.  Your tube gets pulled out. MAKE SURE YOU:   Understand these instructions.  Will watch your condition.  Will get help right away if you are not doing well or get worse.   This information is not intended to replace advice given to you by your health care provider. Make sure you discuss any questions you have with your health care provider.   Document Released: 09/08/2012 Document Revised: 06/04/2014 Document Reviewed: 09/08/2012 Elsevier Interactive Patient Education 2016 [employer]      Ureteral Stent Implantation, Care After Refer to this sheet in the next few weeks. These instructions provide you with information on caring for yourself after your procedure. Your health care provider may also give you more specific instructions. Your treatment has been planned according to current medical practices, but problems sometimes occur. Call your health care provider if you have any problems or questions after your procedure. WHAT TO EXPECT AFTER THE PROCEDURE You should be back to normal activity within 48 hours after the procedure. Nausea and vomiting may occur and are commonly the result of anesthesia. It is common to experience sharp pain in the back or lower abdomen and penis with voiding. This is caused by movement of the ends of the stent with the act of urinating.It usually goes away within minutes after you have stopped urinating. HOME CARE INSTRUCTIONS Make sure to drink plenty of fluids. You may have small amounts of bleeding, causing your urine to be red. This is normal. Certain movements may trigger pain or a feeling that you need to urinate. You may be  given medicines to prevent infection or bladder spasms. Be sure to take all medicines as directed. Only take over-the-counter or prescription medicines for pain, discomfort, or fever as directed by your health care provider. Do not take aspirin, as this can make bleeding worse. Your stent will be left in until the blockage is resolved. This may take 2 weeks or longer, depending on the reason for stent implantation. You may have an X-ray exam to make sure your ureter is open and that the stent has not moved out of position (migrated). The stent can be removed by your health care provider in the office. Medicines may be given for  comfort while the stent is being removed. Be sure to keep all follow-up appointments so your health care provider can check that you are healing properly. SEEK MEDICAL CARE IF: 7. You experience increasing pain. 8. Your pain medicine is not working. SEEK IMMEDIATE MEDICAL CARE IF: 9. Your urine is dark red or has blood clots. 10. You are leaking urine (incontinent). 11. You have a fever, chills, feeling sick to your stomach (nausea), or vomiting. 12. Your pain is not relieved by pain medicine. 13. The end of the stent comes out of the urethra. 14. You are unable to urinate.   This information is not intended to replace advice given to you by your health care provider. Make sure you discuss any questions you have with your health care provider.   Document Released: 01/14/2013 Document Revised: 05/19/2013 Document Reviewed: 11/26/2014 Elsevier Interactive Patient Education 2016 [facility] Instructions  Activity: Get plenty of rest for the remainder of the day. A responsible adult should stay with you for 24 hours following the procedure.  For the next 24 hours, DO NOT: -Drive a car -Paediatric nurse -Drink alcoholic beverages -Take any medication unless instructed by your physician -Make any legal decisions or sign important  papers.  Meals: Start with liquid foods such as gelatin or soup. Progress to regular foods as tolerated. Avoid greasy, spicy, heavy foods. If nausea and/or vomiting occur, drink only clear liquids until the nausea and/or vomiting subsides. Call your physician if vomiting continues.  Special Instructions/Symptoms: Your throat may feel dry or sore from the anesthesia or the breathing tube placed in your throat during surgery. If this causes discomfort, gargle with warm salt water. The discomfort should disappear within 24 hours.  If you had a scopolamine patch placed behind your ear for the management of post- operative nausea and/or vomiting:  1. The medication in the patch is effective for 72 hours, after which it should be removed.  Wrap patch in a tissue and discard in the trash. Wash hands thoroughly with soap and water. 2. You may remove the patch earlier than 72 hours if you experience unpleasant side effects which may include dry mouth, dizziness or visual disturbances. 3. Avoid touching the patch. Wash your hands with soap and water after contact with the patch.

## 2015-06-21 NOTE — Anesthesia Preprocedure Evaluation (Addendum)
Anesthesia Evaluation  Patient identified by MRN, date of birth, ID band Patient awake    Reviewed: Allergy & Precautions, NPO status , Patient's Chart, lab work & pertinent test results  History of Anesthesia Complications (+) PONV and history of anesthetic complications  Airway Mallampati: II  TM Distance: >3 FB Neck ROM: Full    Dental no notable dental hx. (+) Chipped   Pulmonary asthma ,    Pulmonary exam normal breath sounds clear to auscultation       Cardiovascular negative cardio ROS Normal cardiovascular exam Rhythm:Regular Rate:Normal     Neuro/Psych negative neurological ROS  negative psych ROS   GI/Hepatic Neg liver ROS, GERD  Medicated and Controlled,  Endo/Other  negative endocrine ROS  Renal/GU Renal disease  Female GU complaint     Musculoskeletal negative musculoskeletal ROS (+)   Abdominal   Peds negative pediatric ROS (+)  Hematology  (+) anemia ,   Anesthesia Other Findings Cervical Ca w mets   Reproductive/Obstetrics negative OB ROS                            Anesthesia Physical Anesthesia Plan  ASA: II  Anesthesia Plan: General   Post-op Pain Management:    Induction: Intravenous  Airway Management Planned: LMA  Additional Equipment:   Intra-op Plan:   Post-operative Plan: Extubation in OR  Informed Consent: I have reviewed the patients History and Physical, chart, labs and discussed the procedure including the risks, benefits and alternatives for the proposed anesthesia with the patient or authorized representative who has indicated his/her understanding and acceptance.   Dental advisory given  Plan Discussed with: CRNA  Anesthesia Plan Comments:         Anesthesia Quick Evaluation

## 2015-06-21 NOTE — Anesthesia Procedure Notes (Signed)
Procedure Name: LMA Insertion Date/Time: 06/21/2015 9:56 AM Performed by: Bethena Roys T Pre-anesthesia Checklist: Patient identified, Emergency Drugs available, Suction available and Patient being monitored Patient Re-evaluated:Patient Re-evaluated prior to inductionOxygen Delivery Method: Circle System Utilized Preoxygenation: Pre-oxygenation with 100% oxygen Intubation Type: IV induction Ventilation: Mask ventilation without difficulty LMA: LMA inserted LMA Size: 4.0 Number of attempts: 1 Airway Equipment and Method: Bite block Placement Confirmation: positive ETCO2 Dental Injury: Teeth and Oropharynx as per pre-operative assessment

## 2015-06-21 NOTE — Op Note (Signed)
Preoperative diagnosis: Right ureteral stricture, incontinence Postoperative diagnosis: Right ureteral stricture, vesicovaginal fistula, left hydronephrosis  Procedure: Exam under anesthesia, cystoscopy, bladder biopsy and fulguration, right retrograde pyelogram, left retrograde pyelogram, left ureteral stent placement, right ureteral stent exchange, cystogram  Surgeon: Junious Silk  Anesthesia: Gen.  Indication for procedure: 55 year old with history of cervical cancer status post chemotherapy and radiation with incontinence and right ureteral stricture. She presents for exam under anesthesia and stent exchange.  Findings: On exam under anesthesia the proximal vagina and anterior vaginal wall or indurated without specific mass. I placed a Ray-Tec per vagina and noted orange drainage per vagina. With cystoscope in the bladder running water, water pours out from the top of the vagina.  On cystoscopy the urethra appeared normal, the trigone was not identified. About the area of the trigone and posteriorly was a large fistula which contained a large amount of white necrotic tissue. Further posteriorly the fistula went deep likely into the vagina as urine pours out of the vagina running irrigation in the bladder. I could not distend the bladder. With a finger in the vagina I can palpate the distal edge of the fistula but I could never get my finger proximally to see the finger with the cystoscope as the vagina there is I suppose near the cervix. The necrotic white fistula tract extended all the way over close to the left ureteral orifice.  Right retrograde pyelogram-this outlined a single ureter single collecting system unit without filling defect. There was narrowing in the distal ureter and I do not believe there is a ureteric fistula although as the 6 French catheter got past the distal stricture and near the intramural ureter some contrast comes out in the bladder immediately runs into the vagina making  evaluation of the very distal ureter difficult. However the proximal portion of the distal ureter and mid ureter were all normal.   Left retrograde pyelogram-this outlined a single ureter single collecting system unit there was some mild dilation of the collecting system and pelvis. There was no obvious stricture or fistula.  Cystogram-demonstrated immediate drainage per vagina without bladder distention although I could see the balloon in the expected location of the bladder neck.  Description of procedure: After consent was obtained patient brought to the operating room. After adequate anesthesia she was placed in lithotomy position and prepped and draped in the usual sterile fashion. A timeout was performed to confirm the patient and procedure. I placed a Ray-Tec sponge per vagina and noticed some orange drainage. I then passed the cystoscope the right ureteral stent was visualized and medial to this a large fistula extending back from the trigone involving most of the base of the bladder. I could not distend the bladder very well. I passed a Ray-Tec per vagina again, a finger and a 6 [place] opening catheter but could never visualize an object through the fistula in the vagina while watching with the cystoscope. A placed a sponge per vagina in hopes that it would hold some fluid in to distend the bladder.  The right ureteral stent was grasped and removed it was clean. A sensor wire was advanced through the stent and the stent removed. A 6 French open-ended catheter was advanced adjacent to the wire and retrograde injection of contrast was performed. No obvious fistula was noted but as soon as contrast came out of the UO it immediately drained into the fistula which made it difficult to see a very distal fistula at the UVJ or the intramural ureter  although it is possible. However the more proximal portion of the distal ureter in the mid ureter all appeared normal.  I biopsied the fistula tract #1 and the  edge of the bladder #2. These areas were fulgurated lightly.  I given her Pyridium and could see efflux from the left ureteral orifice but was surprised the fistula and necrosis at progressed all the way over to the left UO. Because that is her better kidney I thought it best to shoot a retrograde and location of the ureter could be very difficult in the future. Therefore the left ureteral orifice was cannulated with a sensor wire was some difficulty. A sensor wire was advanced into the collecting system the 6 [place] open-ended catheter advanced into the proximal left ureter and the wire removed. I was surprised at the brisk drainage suspicious of a hydronephrotic drip. A retrograde was performed which did show some mild dilation of the left collecting system and ureter. I pulled the 6 [place] open-ended all the way out on retrograde and noticed no fistula on the left side. The left ureteral orifice was recannulated with a sensor wire and a 6 x 24 cm stent was placed. The wire was removed with a good coil seen in the kidney and a good coil in the bladder.  The wire was backloaded on the right and a 6 x 24 cm stent was placed on the right side. The wire was removed again with a good coil seen in the kidney and a good coil in the bladder.   A 16 French Foley catheter was placed. The tissues are indurated it was difficult to palpate the balloon at the bladder neck. A cystogram was performed by instilling 120 mL of contrast which outlined the balloon in the area the bladder and it seemed to be mobile. He did not irrigated very well with irrigation coming through the vagina. It was left gravity drainage.  The sponge was removed from the vagina.She was awakened and taken to the recovery room in stable condition.   Complications: None   Blood loss: Minimal   Drains: Bilateral 6 x 24 cm ureteral stents, 16 French Foley   Specimens to pathology:  #1 vesicovaginal fistula tract  #2 vesicovaginal fistula  bladder edge

## 2015-06-21 NOTE — Interval H&P Note (Signed)
History and Physical Interval Note:  06/21/2015 8:30 AM  Stacy Webster  has presented today for surgery, with the diagnosis of RIGHT URETERAL STRICTURE, INCONTINENCE  The various methods of treatment have been discussed with the patient and family. After consideration of risks, benefits and other options for treatment, the patient has consented to  Procedure(s) with comments: CYSTOSCOPY/RETROGRADE/URETEROSCOPY (Bilateral) - EVALUATE TO RULE OUT VESICOVAGINAL OR VESICOURETERAL FISTULA  CYSTOGRAM (N/A) CYSTOSCOPY WITH BIOPSY (N/A) - POSS BLADDER BIOPSY as a surgical intervention. Patient reports she has been drier since I saw her last. Patient reports she gets an urge to void and she'll go and sit down but only a few drops will come out with a weak stream. She'll then stand up and urine will run out into her depends. She has had no fever dysuria. The patient's history has been reviewed, patient examined, no change in status, stable for surgery.  I have reviewed the patient's chart and labs. They ask if she had a fistula would a "neobladder" be a possibility. We discussed first off it's possible she has a ureterovesical fistula which could be managed with a nephrostomy tube, reimplant or nephrectomy. As far as a vesicovaginal fistula we discussed simple diversion versus pelvic exoneration utilizing a neobladder or ileal conduit poses significant risk. It's a major urologic and GI procedure. We discussed I discussed the patient with Dr. [name] and cancer recurrence is a possibility and therefore a temporizing measure with nephrostomy tubes might keep her drier in the short-term. Also discussed simply a Foley catheter. They will consider. Questions were answered to the patient's satisfaction. They will consider.   Stacy Webster

## 2015-06-21 NOTE — Anesthesia Postprocedure Evaluation (Signed)
Anesthesia Post Note  Patient: Stacy Webster  Procedure(s) Performed: Procedure(s) (LRB): CYSTOSCOPY; EXAM UNDER ANESTHESIA; BLADDER BIOPSIES WITH FULGARATION; BILATERAL RETROGRADE PYLOGRAM; RIGHT URETERAL STENT EXCHANGE AND LEFT URETERAL STENT PLACEMENT (Bilateral)  Patient location during evaluation: PACU Anesthesia Type: General Level of consciousness: awake and alert Pain management: pain level controlled Vital Signs Assessment: post-procedure vital signs reviewed and stable Respiratory status: spontaneous breathing, nonlabored ventilation, respiratory function stable and patient connected to nasal cannula oxygen Cardiovascular status: blood pressure returned to baseline and stable Postop Assessment: no signs of nausea or vomiting Anesthetic complications: no    Last Vitals:  Filed Vitals:   06/21/15 1045 06/21/15 1054  BP: 148/80   Pulse: 79   Temp:    Resp: 18 12    Last Pain: There were no vitals filed for this visit.               Verity Gilcrest JENNETTE

## 2015-06-22 ENCOUNTER — Other Ambulatory Visit: Payer: Self-pay | Admitting: Oncology

## 2015-06-22 ENCOUNTER — Encounter (HOSPITAL_BASED_OUTPATIENT_CLINIC_OR_DEPARTMENT_OTHER): Payer: Self-pay | Admitting: Urology

## 2015-06-22 DIAGNOSIS — C7989 Secondary malignant neoplasm of other specified sites: Secondary | ICD-10-CM

## 2015-06-22 DIAGNOSIS — D500 Iron deficiency anemia secondary to blood loss (chronic): Secondary | ICD-10-CM

## 2015-06-22 DIAGNOSIS — C539 Malignant neoplasm of cervix uteri, unspecified: Secondary | ICD-10-CM

## 2015-06-23 ENCOUNTER — Other Ambulatory Visit (HOSPITAL_BASED_OUTPATIENT_CLINIC_OR_DEPARTMENT_OTHER): Payer: BC Managed Care – PPO

## 2015-06-23 ENCOUNTER — Encounter: Payer: Self-pay | Admitting: Oncology

## 2015-06-23 ENCOUNTER — Ambulatory Visit (HOSPITAL_BASED_OUTPATIENT_CLINIC_OR_DEPARTMENT_OTHER): Payer: BC Managed Care – PPO | Admitting: Oncology

## 2015-06-23 VITALS — BP 129/82 | HR 122 | Temp 98.0°F | Resp 20 | Ht 64.0 in | Wt 139.7 lb

## 2015-06-23 DIAGNOSIS — C539 Malignant neoplasm of cervix uteri, unspecified: Secondary | ICD-10-CM | POA: Diagnosis not present

## 2015-06-23 DIAGNOSIS — C7989 Secondary malignant neoplasm of other specified sites: Secondary | ICD-10-CM | POA: Diagnosis not present

## 2015-06-23 DIAGNOSIS — D500 Iron deficiency anemia secondary to blood loss (chronic): Secondary | ICD-10-CM

## 2015-06-23 DIAGNOSIS — G620 Drug-induced polyneuropathy: Secondary | ICD-10-CM

## 2015-06-23 DIAGNOSIS — N135 Crossing vessel and stricture of ureter without hydronephrosis: Secondary | ICD-10-CM

## 2015-06-23 DIAGNOSIS — T451X5A Adverse effect of antineoplastic and immunosuppressive drugs, initial encounter: Secondary | ICD-10-CM

## 2015-06-23 DIAGNOSIS — N820 Vesicovaginal fistula: Secondary | ICD-10-CM

## 2015-06-23 DIAGNOSIS — C530 Malignant neoplasm of endocervix: Secondary | ICD-10-CM

## 2015-06-23 LAB — COMPREHENSIVE METABOLIC PANEL
ALBUMIN: 2.8 g/dL — AB (ref 3.5–5.0)
ALK PHOS: 58 U/L (ref 40–150)
ALT: <9 U/L (ref 0–55)
ANION GAP: 9 meq/L (ref 3–11)
AST: 11 U/L (ref 5–34)
BILIRUBIN TOTAL: 0.37 mg/dL (ref 0.20–1.20)
BUN: 17 mg/dL (ref 7.0–26.0)
CO2: 31 mEq/L — ABNORMAL HIGH (ref 22–29)
Calcium: 9.7 mg/dL (ref 8.4–10.4)
Chloride: 97 mEq/L — ABNORMAL LOW (ref 98–109)
Creatinine: 0.9 mg/dL (ref 0.6–1.1)
EGFR: 70 mL/min/{1.73_m2} — AB (ref >90)
GLUCOSE: 119 mg/dL (ref 70–140)
POTASSIUM: 3.4 meq/L — AB (ref 3.5–5.1)
SODIUM: 137 meq/L (ref 136–145)
TOTAL PROTEIN: 7.6 g/dL (ref 6.4–8.3)

## 2015-06-23 LAB — CBC WITH DIFFERENTIAL/PLATELET
BASO%: 0.8 % (ref 0.0–2.0)
Basophils Absolute: 0.1 10*3/uL (ref 0.0–0.1)
EOS%: 1.5 % (ref 0.0–7.0)
Eosinophils Absolute: 0.1 10*3/uL (ref 0.0–0.5)
HCT: 36.3 % (ref 34.8–46.6)
HGB: 11.9 g/dL (ref 11.6–15.9)
LYMPH%: 8.1 % — ABNORMAL LOW (ref 14.0–49.7)
MCH: 30 pg (ref 25.1–34.0)
MCHC: 32.9 g/dL (ref 31.5–36.0)
MCV: 91.1 fL (ref 79.5–101.0)
MONO#: 0.8 10*3/uL (ref 0.1–0.9)
MONO%: 9.5 % (ref 0.0–14.0)
NEUT#: 6.4 10*3/uL (ref 1.5–6.5)
NEUT%: 80.1 % — ABNORMAL HIGH (ref 38.4–76.8)
Platelets: 435 10*3/uL — ABNORMAL HIGH (ref 145–400)
RBC: 3.98 10*6/uL (ref 3.70–5.45)
RDW: 21.1 % — ABNORMAL HIGH (ref 11.2–14.5)
WBC: 8 10*3/uL (ref 3.9–10.3)
lymph#: 0.6 10*3/uL — ABNORMAL LOW (ref 0.9–3.3)

## 2015-06-23 LAB — IRON AND TIBC
%SAT: 16 % — ABNORMAL LOW (ref 21–57)
IRON: 31 ug/dL — AB (ref 41–142)
TIBC: 196 ug/dL — AB (ref 236–444)
UIBC: 166 ug/dL (ref 120–384)

## 2015-06-23 LAB — MAGNESIUM: Magnesium: 1.8 mg/dl (ref 1.5–2.5)

## 2015-06-23 MED ORDER — TRAMADOL HCL 50 MG PO TABS: 50.0000 mg | ORAL_TABLET | Freq: Three times a day (TID) | ORAL | Status: DC | PRN

## 2015-06-23 NOTE — Progress Notes (Signed)
OFFICE PROGRESS NOTE   June 23, 2015   Physicians: Toy Cookey, MD, Michel Bickers, Leonidas Romberg, Genia Del), Marguerita Merles, Janyth Pupa  INTERVAL HISTORY:  Patient is seen, together with sister in law, in follow up of recurrent cervical cancer, most recently treated with CDDP taxol avastin on 12-7 and 05-05-15. She saw Dr [name] on 05-26-15, then progressive urinary incontinence.  CT CAP on 06-09-15 had negative chest, essentially stable right inguinal node compared with 02-2015 and changes bladder/ vagina suggesting necrosis and possible fistula. She had cystoscopy by Dr Junious Silk on 06-21-15, with vesicovaginal fistula close to left ureteral orifice, with right stent change and placement of a left stent, and biopsies x2. She had foley placed at that procedure, which helped incontinence for first 24 hours, but no longer seems to be draining and is causing more discomfort.  Insurance denied PET. As next urology appointment scheduled for 07-05-15, I have spoken with that office now, with work in set up today.  Pain is at foley and in bladder area, using tramadol with improvement but not control. She is up every 2-3 hours changing depends thru night, is exhausted. Last bowel movement was 5 days ago, is using only stool softener. She dislikes miralax but agrees to SenokotS 2 tablets bid today and ongoing at least daily or twice daily after bowels are moving; if no BM by tomorrow, should try dulcolax or glycerin suppository in addition. Almost no bleeding. No fever. No vomiting. Appetite poor. No SOB or cough. No LE swelling.  Remainder of 10 point Review of Systems negative   Refused flu vaccine due to 3 years when had nausea after vaccines.  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  ONCOLOGIC HISTORY Patient had been in usual good health, with no previous abnormal PAP smears, when she developed  frequent urination and vaginal bleeding in late 2015. She had endometrial biopsy by Dr Janyth Pupa [phone]) with adenocarcinoma. PET in [place] system 0-98-11 had hypermetabolic uptake within the uterine cervix and lower uterine segment and metastatic adenopathy within the bilateral pelvis and periaortic retroperitoneum. Dr Genia Del and Dr Marguerita Merles treated with pelvic and para-aortic radiation given with 6 cycles of sensitizing CDDP, and HDR x5 from 07-06-14 thru 09-03-14. Patient had a difficult time with nausea, vomiting, diarrhea and hypokalemia/ hypomagnesemia thru treatment. Patient used ODT zofran and ativan during chemotherapy. She had a difficult time doing oral prehydration for CDDP. She had no peripheral neuropathy with the CDDP. There was concern by completion of the treatment that she did not have optimal response, with plan for repeat evaluation at 6 weeks from completion of the treatment. Prior to planned reevaluation, patient began having daily fevers for possibly 4 weeks, as high as 102 degrees x 2 weeks prior to 10-11-14; by presentation at ED she also had increased right flank pain. She had CT at [facility] on 10-08-14, this compared with PET of 05-2014 showed increase in subcapsular fluid collection right kidney and right hydronephrosis, increase in size of endometrial canal/ uterus/ right adnexa, and improvement in retroperitoneal, common iliac and bilateral pelvic sidewall adenopathy. She was admitted to [facility] ICU 5-16/ 10-12-14 with sepsis from peptostreptococcus. She had percutaneous drainage by IR of 635 cc dark thin fluid from right perinephric area on 10-12-14, with perinephric drain removed by urology on 10-18-14. She had right ureteral stent placement by Dr Junious Silk on 10-13-14. She developed C.difficile diarrhea,  begun on flagyl 10-15-14 x 2 weeks, also on ceftin during that time. She saw Dr [name] in consultation on 11-05-14, with cervix grossly  normal appearance, firm to palpation with right anterior extension, and purulent material from cervical os with manipulation, no palpable adnexal masses, induration R>L parametrium. PET 0-9-29 had hypermetabolic tissue thickening at cecum, + small right sided mesenteric and inguinal nodes, foci in myometriium and in region of cervix, small right perinephric fluid collection and dilated fluid filled endometrium. Dr [name] saw her following the PET, with recommendation for systemic treatment of the metastatic disease with CDDP, taxol and avastin. First CDDP Taxol 11-19-14, neutropenic day 14. Repeat CT showed essentially stable, minimal right perinephric fluid on 12-06-14, prior to cycle 2 CDDP taxol 7-14 with first avastin 12-10-14 and neulasta. CT AP 02-21-15 after 4 cycles CDDP taxol/ 3 cycles avastin showed improvement. Cystoscopy, bladder biopsy with fulguration, right diagnostic ureteroscopy,right retrograde pyelogram, and right ureteral stent exchange by Dr Junious Silk on 02-18-15. Biopsies of areas of erythema and bullous edema in bladder (reportedly representative of similar changes in distal right ureter) were reviewed at [facility], felt to be urothelial atypia possibly related to previous radiation. She was hospitalized with pyometria 02-2015, then recurrent symptoms treated outpatient 03-2015. She had cycle 7 taxol CDDP avastin on 12-7 and 05-05-15. She developed symptomatic vesicovaginal fistula in 05-2015.    Objective:  Vital signs in last 24 hours:  BP 129/82 mmHg  Pulse 122  Temp(Src) 98 F (36.7 C) (Oral)  Resp 20  Ht 5' 4"  (1.626 m)  Wt 139 lb 11.2 oz (63.368 kg)  BMI 23.97 kg/m2  SpO2 100%  LMP 11/05/2011 Weight down 12 lbs from early Dec Alert, oriented and appropriate. Looks uncomfortable seated in WC, respirations not labored RA No alopecia  HEENT:PERRL, sclerae not icteric. Oral mucosa moist without lesions, posterior pharynx clear. Periorbital edema mild.  Neck supple. No JVD.   Lymphatics:no cervical,supraclavicular adenopathy Resp: clear to auscultation bilaterally  Cardio: regular rate and rhythm. No gallop. GI: soft, nontender, full but not distended, no mass or organomegaly. No bowel sounds.  Musculoskeletal/ Extremities: without pitting edema, cords, tenderness Neuro: no change peripheral neuropathy. Otherwise nonfocal. PSYCH appropriate mood and affect Skin without rash, ecchymosis, petechiae Foley in, leg bag almost no urine. Wearing depends  Lab Results:  Results for orders placed or performed in visit on 06/23/15  CBC with Differential  Result Value Ref Range   WBC 8.0 3.9 - 10.3 10e3/uL   NEUT# 6.4 1.5 - 6.5 10e3/uL   HGB 11.9 11.6 - 15.9 g/dL   HCT 36.3 34.8 - 46.6 %   Platelets 435 (H) 145 - 400 10e3/uL   MCV 91.1 79.5 - 101.0 fL   MCH 30.0 25.1 - 34.0 pg   MCHC 32.9 31.5 - 36.0 g/dL   RBC 3.98 3.70 - 5.45 10e6/uL   RDW 21.1 (H) 11.2 - 14.5 %   lymph# 0.6 (L) 0.9 - 3.3 10e3/uL   MONO# 0.8 0.1 - 0.9 10e3/uL   Eosinophils Absolute 0.1 0.0 - 0.5 10e3/uL   Basophils Absolute 0.1 0.0 - 0.1 10e3/uL   NEUT% 80.1 (H) 38.4 - 76.8 %   LYMPH% 8.1 (L) 14.0 - 49.7 %   MONO% 9.5 0.0 - 14.0 %   EOS% 1.5 0.0 - 7.0 %   BASO% 0.8 0.0 - 2.0 %  Comprehensive metabolic panel  Result Value Ref Range   Sodium 137 136 - 145 mEq/L   Potassium 3.4 (L) 3.5 - 5.1  mEq/L   Chloride 97 (L) 98 - 109 mEq/L   CO2 31 (H) 22 - 29 mEq/L   Glucose 119 70 - 140 mg/dl   BUN 17.0 7.0 - 26.0 mg/dL   Creatinine 0.9 0.6 - 1.1 mg/dL   Total Bilirubin 0.37 0.20 - 1.20 mg/dL   Alkaline Phosphatase 58 40 - 150 U/L   AST 11 5 - 34 U/L   ALT <9 0 - 55 U/L   Total Protein 7.6 6.4 - 8.3 g/dL   Albumin 2.8 (L) 3.5 - 5.0 g/dL   Calcium 9.7 8.4 - 10.4 mg/dL   Anion Gap 9 3 - 11 mEq/L   EGFR 70 (L) >90 ml/min/1.73 m2  Magnesium - [facility]  Result Value Ref Range   Magnesium 1.8 1.5 - 2.5 mg/dl  Iron and TIBC  Result Value Ref Range   Iron 31 (L) 41 - 142 ug/dL   TIBC 196 (L) 236  - 444 ug/dL   UIBC 166 120 - 384 ug/dL   %SAT 16 (L) 21 - 57 %     Studies/Results: EXAM: CT CHEST, ABDOMEN, AND PELVIS WITH CONTRAST  TECHNIQUE: Multidetector CT imaging of the chest, abdomen and pelvis was performed following the standard protocol during bolus administration of intravenous contrast.  CONTRAST: 132m OMNIPAQUE IOHEXOL 300 MG/ML SOLN  COMPARISON: 03/23/2015 abdominal pelvic CT. No prior chest CT. PET of 11/04/2014 is reviewed.  FINDINGS: CT CHEST  Mediastinum/Nodes: No supraclavicular adenopathy. Normal heart size, without pericardial effusion. No central pulmonary embolism, on this non-dedicated study. No mediastinal or hilar adenopathy.  Lungs/Pleura: No pleural fluid. Clear lungs.  Musculoskeletal: No acute osseous abnormality.  CT ABDOMEN AND PELVIS  Hepatobiliary: Normal liver. Cholecystectomy, without biliary ductal dilatation.  Pancreas: Mild pancreatic atrophy involving the head and uncinate process. No duct dilatation or acute inflammation.  Spleen: Normal in size, without focal abnormality.  Adrenals/Urinary Tract: Normal adrenal glands. Mild left-sided caliectasis is unchanged. No hydroureter.  Interpolar left renal lesion is too small to characterize. right renal atrophy. Right perinephric fluid collection is small and similar. Right ureteric stent in place. This terminates in the urinary bladder.  Moderate bladder wall thickening with surrounding edema. This is superimposed upon underdistention. significantly increased.  Stomach/Bowel: Normal stomach, without wall thickening. Colonic stool burden suggests constipation. Normal terminal ileum and appendix. Slightly improved distal ileal wall thickening, including on image 102/series 2. Small bowel otherwise unremarkable.  Vascular/Lymphatic: Normal caliber of the aorta and branch vessels. No retroperitoneal or retrocrural adenopathy. Right inguinal node measures  1.3 cm on image 110/series 2. 1.4 cm on the prior.  Reproductive: Endometrial fluid is decreased. Decreased endometrial gas. Calcifications within and along the endometrium are likely related to fibroids. Residual soft tissue fullness within the uterine cervix with ill definition of surrounding fat planes. Felt to be similar, including on image 108/series 2. Air identified in the region of the vaginal fornices and endocervix including on images 112 and 109 of series 2 respectively. Similar. The vaginal fornix is intimately associated with the rectal wall, including on image 111/series 2.  Right ovarian follicle or cyst.  Other: No significant free fluid.  Musculoskeletal: Sacroiliac joint sclerosis is likely degenerative. Mild superior endplate irregularity at L3 is chronic.  IMPRESSION: CT CHEST IMPRESSION  No acute process or evidence of metastatic disease in the chest.  CT ABDOMEN AND PELVIS IMPRESSION  1. Similar soft tissue fullness within the lower uterine segment and cervix. Decreased endometrial fluid and gas, likely secondary. Gas within the endocervix  and vaginal fornices is indeterminate. This could relate to necrosis. Especially given the close association with the rectum, fistulous communication cannot be excluded. 2. Right inguinal adenopathy is decreased minimally and suspicious.  3. Similar right perinephric fluid collection with renal atrophy and ureteric stent in place. 4. Progressive bladder wall thickening, possibly related to radiation induced cystitis. 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 5. Similar mild left caliectasis, indeterminate. 6. Possible constipation  PACs images reviewed by MD    Surgical Path from bladder/ fistula biopsies ([id number]) Patient: Stacy Webster, Stacy Webster Collected: 06/21/2015 Client: [facility] Accession: [id number] Received: 06/21/2015 Georgette Dover, MDORT OF SURGICAL PATHOLOGY FINAL  DIAGNOSIS Diagnosis 1. Bladder, biopsy, vesicovaginal fistula - NECROINFLAMMATORY DEBRIS. - NO UROTHELIUM. - NO MALIGNANCY. 2. 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 - UNDERLYING MIXED INFLAMMATION AND FOCAL NECROSIS, SEE COMMENT. Microscopic Comment 2. On one side of the fragments is benign urothelium with cystitis glandularis. Underlying this is marked mixed inflammation which becomes necrotic toward the opposite edge. These findings would be consistent with the clinically stated vesicovaginal fistula. There is no malignancy.   Medications: I have reviewed the patient's current medications. Refill tramadol 50 mg  1-2 every 8 hrs prn pain # 60 Discussed low iron. She does not want to attempt IV access for IV iron, states she will try to take oral iron. Senokot S 2 tablets bid for now.   DISCUSSION  I spoke with urology office now, APP to see today, ? try to reposition foley vs remove as it does not seem to be diverting urine flow now from the large vesicovaginal fistula.   Last avastin was 05-04-15; recommendations are to wait at least 4 weeks from avastin before elective surgery, so OK from that standpoint if nephrostomies recommended. I have told her that nephrostomy tubes tend to be uncomfortable mostly at first, may be reasonable trade off for the urinary incontinence.   I have told them that she is not appropriate for further avastin given this fistula. As otherwise minimal apparent disease on CT now, will be best to hold off on systemic treatment until / unless the urologic situation is better managed. She is in full agreement with no systemic treatment now.   Path of bladder biopsy only necrotic/ inflammatory tissue, without malignancy  Assessment/Plan: 1.1. 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 Treated with 7 cycles of CDDP taxol avastin thru 05-05-15, with stable right inguinal node by CT CAP 06-09-15. Hold further systemic treatment with vesicovaginal fistula for now, not candidate for further avastin. 2. 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 Appreciate Dr Lyndal Rainbow help, to be seen as work in at urology today to evaluate foley. She is out >4 weeks from last avastin, so OK for procedure such as diverting nephrostomy tubes if recommended. 3.constipation: add Senokot S as above. She refuses miralax 4.progressive weight loss, poor po intake related to above 5.difficult peripheral IV access, has not wanted central catheter 6.iron deficiency related to ongoing blood loss from stents, blood draws. Declines IV iron due to IV access difficulty.  7.history of urosepsis and C diff in 2016 8.refused flu vaccine. 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   All questions answered and she should let us know if needed prior to next appointment. Will let gyn oncology and other MDs know situation  and set up follow up. Time spent 30 min including >50% counseling and coordination of care. Cc Drs [name], Kathi Ludwig, MD   06/23/2015, 10:47 AM

## 2015-06-25 ENCOUNTER — Other Ambulatory Visit: Payer: Self-pay | Admitting: Oncology

## 2015-06-25 DIAGNOSIS — N820 Vesicovaginal fistula: Secondary | ICD-10-CM | POA: Insufficient documentation

## 2015-06-25 DIAGNOSIS — N135 Crossing vessel and stricture of ureter without hydronephrosis: Secondary | ICD-10-CM | POA: Insufficient documentation

## 2015-06-28 ENCOUNTER — Telehealth: Payer: Self-pay | Admitting: *Deleted

## 2015-06-28 ENCOUNTER — Telehealth: Payer: Self-pay | Admitting: Oncology

## 2015-06-28 NOTE — Telephone Encounter (Signed)
Left message on Voicemail with new appointment details. Pt has a appointment scheduled 09/02/2015 @ 3:00pm.

## 2015-06-28 NOTE — Telephone Encounter (Signed)
Left a message with ?daughter and advised appointments made and can view on mychart   anne

## 2015-07-17 ENCOUNTER — Other Ambulatory Visit: Payer: Self-pay | Admitting: Oncology

## 2015-07-21 ENCOUNTER — Encounter: Payer: Self-pay | Admitting: Oncology

## 2015-07-21 ENCOUNTER — Other Ambulatory Visit (HOSPITAL_BASED_OUTPATIENT_CLINIC_OR_DEPARTMENT_OTHER): Payer: BC Managed Care – PPO

## 2015-07-21 ENCOUNTER — Ambulatory Visit (HOSPITAL_BASED_OUTPATIENT_CLINIC_OR_DEPARTMENT_OTHER): Payer: BC Managed Care – PPO | Admitting: Oncology

## 2015-07-21 VITALS — BP 125/73 | HR 113 | Temp 98.9°F | Resp 18 | Ht 64.0 in | Wt 136.8 lb

## 2015-07-21 DIAGNOSIS — D500 Iron deficiency anemia secondary to blood loss (chronic): Secondary | ICD-10-CM | POA: Diagnosis not present

## 2015-07-21 DIAGNOSIS — C530 Malignant neoplasm of endocervix: Secondary | ICD-10-CM

## 2015-07-21 DIAGNOSIS — G893 Neoplasm related pain (acute) (chronic): Secondary | ICD-10-CM | POA: Diagnosis not present

## 2015-07-21 DIAGNOSIS — N820 Vesicovaginal fistula: Secondary | ICD-10-CM

## 2015-07-21 DIAGNOSIS — C539 Malignant neoplasm of cervix uteri, unspecified: Secondary | ICD-10-CM

## 2015-07-21 DIAGNOSIS — R634 Abnormal weight loss: Secondary | ICD-10-CM

## 2015-07-21 DIAGNOSIS — C7989 Secondary malignant neoplasm of other specified sites: Secondary | ICD-10-CM

## 2015-07-21 LAB — COMPREHENSIVE METABOLIC PANEL
ALT: 37 U/L (ref 0–55)
AST: 30 U/L (ref 5–34)
Albumin: 2.1 g/dL — ABNORMAL LOW (ref 3.5–5.0)
Alkaline Phosphatase: 78 U/L (ref 40–150)
Anion Gap: 9 mEq/L (ref 3–11)
BUN: 17.3 mg/dL (ref 7.0–26.0)
CALCIUM: 9.1 mg/dL (ref 8.4–10.4)
CHLORIDE: 95 meq/L — AB (ref 98–109)
CO2: 30 meq/L — AB (ref 22–29)
CREATININE: 0.9 mg/dL (ref 0.6–1.1)
EGFR: 75 mL/min/{1.73_m2} — ABNORMAL LOW (ref >90)
GLUCOSE: 131 mg/dL (ref 70–140)
Potassium: 4.5 mEq/L (ref 3.5–5.1)
SODIUM: 135 meq/L — AB (ref 136–145)
Total Bilirubin: <0.3 mg/dL (ref 0.20–1.20)
Total Protein: 7.1 g/dL (ref 6.4–8.3)

## 2015-07-21 LAB — CBC WITH DIFFERENTIAL/PLATELET
BASO%: 0.4 % (ref 0.0–2.0)
BASOS ABS: 0 10*3/uL (ref 0.0–0.1)
EOS%: 1.1 % (ref 0.0–7.0)
Eosinophils Absolute: 0.1 10*3/uL (ref 0.0–0.5)
HEMATOCRIT: 30.4 % — AB (ref 34.8–46.6)
HGB: 10 g/dL — ABNORMAL LOW (ref 11.6–15.9)
LYMPH%: 5.9 % — AB (ref 14.0–49.7)
MCH: 29.2 pg (ref 25.1–34.0)
MCHC: 32.7 g/dL (ref 31.5–36.0)
MCV: 89.4 fL (ref 79.5–101.0)
MONO#: 1 10*3/uL — ABNORMAL HIGH (ref 0.1–0.9)
MONO%: 9.1 % (ref 0.0–14.0)
NEUT#: 9.1 10*3/uL — ABNORMAL HIGH (ref 1.5–6.5)
NEUT%: 83.5 % — AB (ref 38.4–76.8)
Platelets: 555 10*3/uL — ABNORMAL HIGH (ref 145–400)
RBC: 3.4 10*6/uL — AB (ref 3.70–5.45)
RDW: 20 % — ABNORMAL HIGH (ref 11.2–14.5)
WBC: 10.8 10*3/uL — ABNORMAL HIGH (ref 3.9–10.3)
lymph#: 0.6 10*3/uL — ABNORMAL LOW (ref 0.9–3.3)

## 2015-07-21 MED ORDER — LORAZEPAM 1 MG PO TABS: ORAL_TABLET | ORAL | Status: DC

## 2015-07-21 MED ORDER — TRAMADOL HCL 50 MG PO TABS: 50.0000 mg | ORAL_TABLET | Freq: Three times a day (TID) | ORAL | Status: DC | PRN

## 2015-07-21 NOTE — Progress Notes (Signed)
OFFICE PROGRESS NOTE   July 21, 2015   Physicians: Toy Cookey, MD, Michel Bickers, Leonidas Romberg, Genia Del), Marguerita Merles, Janyth Pupa  INTERVAL HISTORY:   Patient is seen, together with sister in law, in scheduled follow up of recurrent cervical cancer. 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 Foley was removed by urology on 06-24-15, as that was uncomfortable and not helpful. Next scheduled appointment with Dr Junious Silk  She is to see Dr [name] next on 09-02-15.   Patient has had no improvement apparent in the urinary incontinence, but is tolerating Depends. There is more odor, only occasional pink tinge to urine. Temps at night ~ 99, no shaking chills. Pain has improved somewhat, uses only occasional tramadol and ibuprofen, most of pain in coccyx region.  She cannot tell any specific discomfort related to bilateral ureteral stents. Appetite is slightly better, tho very specific preferences, no vomiting, "can either drink fluids or eat". Bowels move normally. She is "shaky in AMs" and does not tolerate much activity, generally up for < 1 hr at a time. No other bleeding. No increased SOB with present activity level.  No other localizing symptoms of infection   Refused flu vaccine due to 3 years when had nausea after vaccines.  No central line BIlateral ureteral stents 06-21-15  ONCOLOGIC HISTORY Patient had been in usual good health, with no previous abnormal PAP smears, when she developed frequent urination and vaginal bleeding in late 2015. She had endometrial biopsy by Dr Janyth Pupa [id number]) with adenocarcinoma. PET in [place] system 8-00-34 had hypermetabolic uptake within the uterine cervix and lower uterine segment and metastatic adenopathy within the bilateral pelvis and periaortic retroperitoneum. Dr Genia Del and Dr Marguerita Merles treated with pelvic and para-aortic radiation given with  6 cycles of sensitizing CDDP, and HDR x5 from 07-06-14 thru 09-03-14. Patient had a difficult time with nausea, vomiting, diarrhea and hypokalemia/ hypomagnesemia thru treatment. Patient used ODT zofran and ativan during chemotherapy. She had a difficult time doing oral prehydration for CDDP. She had no peripheral neuropathy with the CDDP. There was concern by completion of the treatment that she did not have optimal response, with plan for repeat evaluation at 6 weeks from completion of the treatment. Prior to planned reevaluation, patient began having daily fevers for possibly 4 weeks, as high as 102 degrees x 2 weeks prior to 10-11-14; by presentation at ED she also had increased right flank pain. She had [facility] on 10-08-14, this compared with PET of 05-2014 showed increase in subcapsular fluid collection right kidney and right hydronephrosis, increase in size of endometrial canal/ uterus/ right adnexa, and improvement in retroperitoneal, common iliac and bilateral pelvic sidewall adenopathy. She was admitted to [facility] ICU 5-16/ 10-12-14 with sepsis from peptostreptococcus. She had percutaneous drainage by IR of 635 cc dark thin fluid from right perinephric area on 10-12-14, with perinephric drain removed by urology on 10-18-14. She had right ureteral stent placement by Dr Junious Silk on 10-13-14. She developed C.difficile diarrhea, begun on flagyl 10-15-14 x 2 weeks, also on ceftin during that time. She saw Dr [name] in consultation on 11-05-14, with cervix grossly normal appearance, firm to palpation with right anterior extension, and purulent material from cervical os with manipulation, no palpable adnexal masses, induration R>L parametrium. PET 01-26-78 had hypermetabolic tissue thickening at cecum, + small right sided mesenteric and inguinal nodes, foci in myometriium and in region of cervix, small right  perinephric fluid collection and dilated fluid filled endometrium. Dr [name] saw her  following the PET, with recommendation for systemic treatment of the metastatic disease with CDDP, taxol and avastin. First CDDP Taxol 11-19-14, neutropenic day 14. Repeat CT showed essentially stable, minimal right perinephric fluid on 12-06-14, prior to cycle 2 CDDP taxol 7-14 with first avastin 12-10-14 and neulasta. CT AP 02-21-15 after 4 cycles CDDP taxol/ 3 cycles avastin showed improvement. Cystoscopy, bladder biopsy with fulguration, right diagnostic ureteroscopy,right retrograde pyelogram, and right ureteral stent exchange by Dr Junious Silk on 02-18-15. 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 She was hospitalized with pyometria 02-2015, then recurrent symptoms treated outpatient 03-2015. She had cycle 7 taxol CDDP avastin on 12-7 and 05-05-15. She developed symptomatic vesicovaginal fistula in 05-2015.   Objective:  Vital signs in last 24 hours:  BP 125/73 mmHg  Pulse 113  Temp(Src) 98.9 F (37.2 C) (Oral)  Resp 18  Ht 5' 4"  (1.626 m)  Wt 136 lb 12.8 oz (62.052 kg)  BMI 23.47 kg/m2  SpO2 100%  LMP 11/05/2011 Weight down 3 lbs from late Jan. Alert, oriented and appropriate. Ambulatory chair to wheelchair. Looks chronically ill but not acutely uncomfortable. Respirations not labored RA. No alopecia  HEENT:PERRL, sclerae not icteric. Oral mucosa moist without lesions, posterior pharynx clear.  Neck supple. No JVD.  Lymphatics:no cervical,supraclavicular, axillary or inguinal adenopathy Resp: clear to auscultation bilaterally and normal percussion bilaterally Cardio: regular rate and rhythm. No gallop. GI: soft, nontender, not distended, no mass or organomegaly. Some bowel sounds. Musculoskeletal/ Extremities: without pitting edema, cords, tenderness Neuro: no change peripheral neuropathy. Otherwise  nonfocal. PSYCH appropriate mood and affect Skin without rash, ecchymosis, petechiae   Lab Results:  Results for orders placed or performed in visit on 07/21/15  CBC with Differential  Result Value Ref Range   WBC 10.8 (H) 3.9 - 10.3 10e3/uL   NEUT# 9.1 (H) 1.5 - 6.5 10e3/uL   HGB 10.0 (L) 11.6 - 15.9 g/dL   HCT 30.4 (L) 34.8 - 46.6 %   Platelets 555 (H) 145 - 400 10e3/uL   MCV 89.4 79.5 - 101.0 fL   MCH 29.2 25.1 - 34.0 pg   MCHC 32.7 31.5 - 36.0 g/dL   RBC 3.40 (L) 3.70 - 5.45 10e6/uL   RDW 20.0 (H) 11.2 - 14.5 %   lymph# 0.6 (L) 0.9 - 3.3 10e3/uL   MONO# 1.0 (H) 0.1 - 0.9 10e3/uL   Eosinophils Absolute 0.1 0.0 - 0.5 10e3/uL   Basophils Absolute 0.0 0.0 - 0.1 10e3/uL   NEUT% 83.5 (H) 38.4 - 76.8 %   LYMPH% 5.9 (L) 14.0 - 49.7 %   MONO% 9.1 0.0 - 14.0 %   EOS% 1.1 0.0 - 7.0 %   BASO% 0.4 0.0 - 2.0 %  Comprehensive metabolic panel  Result Value Ref Range   Sodium 135 (L) 136 - 145 mEq/L   Potassium 4.5 3.5 - 5.1 mEq/L   Chloride 95 (L) 98 - 109 mEq/L   CO2 30 (H) 22 - 29 mEq/L   Glucose 131 70 - 140 mg/dl   BUN 17.3 7.0 - 26.0 mg/dL   Creatinine 0.9 0.6 - 1.1 mg/dL   Total Bilirubin <0.30 0.20 - 1.20 mg/dL   Alkaline Phosphatase 78 40 - 150 U/L   AST 30 5 - 34 U/L  ALT 37 0 - 55 U/L   Total Protein 7.1 6.4 - 8.3 g/dL   Albumin 2.1 (L) 3.5 - 5.0 g/dL   Calcium 9.1 8.4 - 10.4 mg/dL   Anion Gap 9 3 - 11 mEq/L   EGFR 75 (L) >90 ml/min/1.73 m2   iron studies 05-2015 serum iron 31, 16% sat  Studies/Results:  No results found.  Medications: I have reviewed the patient's current medications. I do not believe she is taking the oral iron; she has previously declined IV iron due to difficult IV access Gyn oncology recommends peroxide douche for odor thought related to necrotic tissue, dilute at least 3:1 with tap water, peri bottle given.   DISCUSSION Encouraged patient to continue to attempt good nutrition and good hydration. She is not requesting and does not appear  appropriate for additional chemotherapy, and certainly not more avastin.  Patient has not seemed anxious to consider nephrostomy tubes for urinary diversion. Per my discussion with Dr [name] also today, possibly pelvic exenteration later if otherwise stable and out further from previous treatment.  She will keep appointment with Dr [name] in early April. Will recheck labs at that visit. I am glad to see her back at any time if she or other MDs request, but have not scheduled another visit yet.  Discussed ongoing blood loss with ureteral stents, encouraged oral iron as possible.   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 Treated with 7 cycles of CDDP taxol avastin thru 05-05-15, with stable right inguinal node by CT CAP 06-09-15.  2. 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 Appreciate Dr Lyndal Rainbow help, to be seen as work in at urology today to evaluate foley. She is out almost 12 weeks from last avastin, so OK for procedure such as diverting nephrostomy tubes if recommended (tho next scheduled urology apt not until June) 3.constipation: add Senokot S as above. She refuses miralax 4.progressive weight loss, poor po intake related to above 5.difficult peripheral IV access, has not wanted central catheter 6.iron deficiency related to ongoing blood loss from stents, blood draws. Declines IV iron due to IV access difficulty. Dislikes oral iron.  7.history of urosepsis and C diff in 2016 8.refused flu vaccine. 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  Time spent 25 min including >50% counseling and coordination of care. Cc Dr  Junious Silk, Dr Erlene Quan, and routed thru [facility] to PCP   Gordy Levan, MD   07/21/2015, 4:06 PM

## 2015-07-26 ENCOUNTER — Telehealth: Payer: Self-pay | Admitting: Oncology

## 2015-07-26 NOTE — Telephone Encounter (Signed)
S/w pt, gave apt 4/7 @ 2.30p.

## 2015-07-31 ENCOUNTER — Encounter: Payer: Self-pay | Admitting: Gynecologic Oncology

## 2015-08-01 ENCOUNTER — Emergency Department (HOSPITAL_COMMUNITY): Payer: BC Managed Care – PPO

## 2015-08-01 ENCOUNTER — Ambulatory Visit (HOSPITAL_BASED_OUTPATIENT_CLINIC_OR_DEPARTMENT_OTHER): Payer: BC Managed Care – PPO | Admitting: Nurse Practitioner

## 2015-08-01 ENCOUNTER — Other Ambulatory Visit: Payer: Self-pay

## 2015-08-01 ENCOUNTER — Other Ambulatory Visit: Payer: Self-pay | Admitting: Oncology

## 2015-08-01 ENCOUNTER — Other Ambulatory Visit: Payer: Self-pay | Admitting: *Deleted

## 2015-08-01 ENCOUNTER — Telehealth: Payer: Self-pay | Admitting: Nurse Practitioner

## 2015-08-01 ENCOUNTER — Telehealth: Payer: Self-pay

## 2015-08-01 ENCOUNTER — Other Ambulatory Visit (HOSPITAL_BASED_OUTPATIENT_CLINIC_OR_DEPARTMENT_OTHER): Payer: BC Managed Care – PPO

## 2015-08-01 ENCOUNTER — Inpatient Hospital Stay (HOSPITAL_COMMUNITY)
Admission: EM | Admit: 2015-08-01 | Discharge: 2015-08-08 | DRG: 853 | Disposition: A | Discharge status: Home Health | Payer: BC Managed Care – PPO | Attending: Internal Medicine | Admitting: Internal Medicine

## 2015-08-01 ENCOUNTER — Encounter (HOSPITAL_COMMUNITY): Payer: Self-pay | Admitting: Emergency Medicine

## 2015-08-01 VITALS — BP 122/71 | HR 136 | Temp 98.7°F | Resp 18 | Ht 64.0 in | Wt 136.2 lb

## 2015-08-01 DIAGNOSIS — C786 Secondary malignant neoplasm of retroperitoneum and peritoneum: Secondary | ICD-10-CM | POA: Diagnosis present

## 2015-08-01 DIAGNOSIS — C7989 Secondary malignant neoplasm of other specified sites: Secondary | ICD-10-CM | POA: Diagnosis not present

## 2015-08-01 DIAGNOSIS — Z91018 Allergy to other foods: Secondary | ICD-10-CM | POA: Diagnosis not present

## 2015-08-01 DIAGNOSIS — N760 Acute vaginitis: Secondary | ICD-10-CM | POA: Diagnosis present

## 2015-08-01 DIAGNOSIS — N321 Vesicointestinal fistula: Secondary | ICD-10-CM

## 2015-08-01 DIAGNOSIS — E43 Unspecified severe protein-calorie malnutrition: Secondary | ICD-10-CM | POA: Diagnosis present

## 2015-08-01 DIAGNOSIS — N820 Vesicovaginal fistula: Secondary | ICD-10-CM

## 2015-08-01 DIAGNOSIS — Z6823 Body mass index (BMI) 23.0-23.9, adult: Secondary | ICD-10-CM | POA: Diagnosis not present

## 2015-08-01 DIAGNOSIS — N390 Urinary tract infection, site not specified: Secondary | ICD-10-CM | POA: Diagnosis present

## 2015-08-01 DIAGNOSIS — R310 Gross hematuria: Secondary | ICD-10-CM | POA: Diagnosis present

## 2015-08-01 DIAGNOSIS — R110 Nausea: Secondary | ICD-10-CM | POA: Diagnosis not present

## 2015-08-01 DIAGNOSIS — K520 Gastroenteritis and colitis due to radiation: Secondary | ICD-10-CM | POA: Diagnosis present

## 2015-08-01 DIAGNOSIS — Z9114 Patient's other noncompliance with medication regimen: Secondary | ICD-10-CM | POA: Diagnosis not present

## 2015-08-01 DIAGNOSIS — Z923 Personal history of irradiation: Secondary | ICD-10-CM | POA: Diagnosis not present

## 2015-08-01 DIAGNOSIS — E876 Hypokalemia: Secondary | ICD-10-CM | POA: Diagnosis present

## 2015-08-01 DIAGNOSIS — Y842 Radiological procedure and radiotherapy as the cause of abnormal reaction of the patient, or of later complication, without mention of misadventure at the time of the procedure: Secondary | ICD-10-CM | POA: Diagnosis present

## 2015-08-01 DIAGNOSIS — C539 Malignant neoplasm of cervix uteri, unspecified: Secondary | ICD-10-CM

## 2015-08-01 DIAGNOSIS — T451X5A Adverse effect of antineoplastic and immunosuppressive drugs, initial encounter: Secondary | ICD-10-CM | POA: Diagnosis present

## 2015-08-01 DIAGNOSIS — C7951 Secondary malignant neoplasm of bone: Secondary | ICD-10-CM | POA: Diagnosis present

## 2015-08-01 DIAGNOSIS — D509 Iron deficiency anemia, unspecified: Secondary | ICD-10-CM | POA: Diagnosis present

## 2015-08-01 DIAGNOSIS — D649 Anemia, unspecified: Secondary | ICD-10-CM | POA: Diagnosis not present

## 2015-08-01 DIAGNOSIS — K219 Gastro-esophageal reflux disease without esophagitis: Secondary | ICD-10-CM | POA: Diagnosis present

## 2015-08-01 DIAGNOSIS — Z79899 Other long term (current) drug therapy: Secondary | ICD-10-CM | POA: Diagnosis not present

## 2015-08-01 DIAGNOSIS — N3000 Acute cystitis without hematuria: Secondary | ICD-10-CM | POA: Diagnosis not present

## 2015-08-01 DIAGNOSIS — C774 Secondary and unspecified malignant neoplasm of inguinal and lower limb lymph nodes: Secondary | ICD-10-CM | POA: Diagnosis present

## 2015-08-01 DIAGNOSIS — Z2821 Immunization not carried out because of patient refusal: Secondary | ICD-10-CM | POA: Diagnosis not present

## 2015-08-01 DIAGNOSIS — N823 Fistula of vagina to large intestine: Secondary | ICD-10-CM

## 2015-08-01 DIAGNOSIS — N151 Renal and perinephric abscess: Secondary | ICD-10-CM | POA: Diagnosis present

## 2015-08-01 DIAGNOSIS — G620 Drug-induced polyneuropathy: Secondary | ICD-10-CM | POA: Diagnosis present

## 2015-08-01 DIAGNOSIS — K5909 Other constipation: Secondary | ICD-10-CM | POA: Diagnosis present

## 2015-08-01 DIAGNOSIS — Z885 Allergy status to narcotic agent status: Secondary | ICD-10-CM

## 2015-08-01 DIAGNOSIS — A419 Sepsis, unspecified organism: Secondary | ICD-10-CM | POA: Diagnosis present

## 2015-08-01 DIAGNOSIS — Z888 Allergy status to other drugs, medicaments and biological substances status: Secondary | ICD-10-CM | POA: Diagnosis not present

## 2015-08-01 DIAGNOSIS — E46 Unspecified protein-calorie malnutrition: Secondary | ICD-10-CM | POA: Diagnosis not present

## 2015-08-01 DIAGNOSIS — E871 Hypo-osmolality and hyponatremia: Secondary | ICD-10-CM | POA: Diagnosis present

## 2015-08-01 DIAGNOSIS — R32 Unspecified urinary incontinence: Secondary | ICD-10-CM | POA: Diagnosis present

## 2015-08-01 DIAGNOSIS — Z9049 Acquired absence of other specified parts of digestive tract: Secondary | ICD-10-CM

## 2015-08-01 DIAGNOSIS — D6481 Anemia due to antineoplastic chemotherapy: Secondary | ICD-10-CM | POA: Diagnosis present

## 2015-08-01 DIAGNOSIS — Z933 Colostomy status: Secondary | ICD-10-CM

## 2015-08-01 DIAGNOSIS — M129 Arthropathy, unspecified: Secondary | ICD-10-CM | POA: Diagnosis present

## 2015-08-01 DIAGNOSIS — J4520 Mild intermittent asthma, uncomplicated: Secondary | ICD-10-CM | POA: Diagnosis present

## 2015-08-01 DIAGNOSIS — C7982 Secondary malignant neoplasm of genital organs: Secondary | ICD-10-CM | POA: Diagnosis not present

## 2015-08-01 DIAGNOSIS — R509 Fever, unspecified: Secondary | ICD-10-CM | POA: Diagnosis present

## 2015-08-01 DIAGNOSIS — E869 Volume depletion, unspecified: Secondary | ICD-10-CM | POA: Diagnosis present

## 2015-08-01 DIAGNOSIS — D500 Iron deficiency anemia secondary to blood loss (chronic): Secondary | ICD-10-CM

## 2015-08-01 DIAGNOSIS — E860 Dehydration: Secondary | ICD-10-CM | POA: Diagnosis not present

## 2015-08-01 LAB — COMPREHENSIVE METABOLIC PANEL
ALBUMIN: 2.2 g/dL — AB (ref 3.5–5.0)
ALT: 24 U/L (ref 0–55)
AST: 20 U/L (ref 5–34)
Alkaline Phosphatase: 86 U/L (ref 40–150)
Anion Gap: 12 mEq/L — ABNORMAL HIGH (ref 3–11)
BUN: 16.9 mg/dL (ref 7.0–26.0)
CO2: 28 meq/L (ref 22–29)
Calcium: 9.3 mg/dL (ref 8.4–10.4)
Chloride: 95 mEq/L — ABNORMAL LOW (ref 98–109)
Creatinine: 0.9 mg/dL (ref 0.6–1.1)
EGFR: 69 mL/min/{1.73_m2} — ABNORMAL LOW (ref >90)
GLUCOSE: 148 mg/dL — AB (ref 70–140)
Potassium: 4 mEq/L (ref 3.5–5.1)
SODIUM: 135 meq/L — AB (ref 136–145)
TOTAL PROTEIN: 7.5 g/dL (ref 6.4–8.3)

## 2015-08-01 LAB — CBC WITH DIFFERENTIAL/PLATELET
BASO%: 0.2 % (ref 0.0–2.0)
Basophils Absolute: 0 10*3/uL (ref 0.0–0.1)
EOS ABS: 0.1 10*3/uL (ref 0.0–0.5)
EOS%: 0.5 % (ref 0.0–7.0)
HCT: 30.1 % — ABNORMAL LOW (ref 34.8–46.6)
HEMOGLOBIN: 9.6 g/dL — AB (ref 11.6–15.9)
LYMPH%: 4 % — AB (ref 14.0–49.7)
MCH: 27.9 pg (ref 25.1–34.0)
MCHC: 31.8 g/dL (ref 31.5–36.0)
MCV: 87.7 fL (ref 79.5–101.0)
MONO#: 1 10*3/uL — ABNORMAL HIGH (ref 0.1–0.9)
MONO%: 6.6 % (ref 0.0–14.0)
NEUT%: 88.7 % — ABNORMAL HIGH (ref 38.4–76.8)
NEUTROS ABS: 13.5 10*3/uL — AB (ref 1.5–6.5)
Platelets: 640 10*3/uL — ABNORMAL HIGH (ref 145–400)
RBC: 3.44 10*6/uL — AB (ref 3.70–5.45)
RDW: 20.3 % — AB (ref 11.2–14.5)
WBC: 15.2 10*3/uL — AB (ref 3.9–10.3)
lymph#: 0.6 10*3/uL — ABNORMAL LOW (ref 0.9–3.3)

## 2015-08-01 LAB — APTT: APTT: 32 s (ref 24–37)

## 2015-08-01 LAB — PROTIME-INR
INR: 1.19 (ref 0.00–1.49)
Prothrombin Time: 15.3 seconds — ABNORMAL HIGH (ref 11.6–15.2)

## 2015-08-01 LAB — I-STAT CG4 LACTIC ACID, ED: LACTIC ACID, VENOUS: 0.93 mmol/L (ref 0.5–2.0)

## 2015-08-01 LAB — PROCALCITONIN: Procalcitonin: 0.23 ng/mL

## 2015-08-01 LAB — LACTIC ACID, PLASMA: Lactic Acid, Venous: 1 mmol/L (ref 0.5–2.0)

## 2015-08-01 MED ORDER — ENOXAPARIN SODIUM 40 MG/0.4ML ~~LOC~~ SOLN
40.0000 mg | SUBCUTANEOUS | Status: DC
  Administered 2015-08-01 – 2015-08-02 (×2): 40 mg via SUBCUTANEOUS
  Filled 2015-08-01 (×3): qty 0.4

## 2015-08-01 MED ORDER — DOCUSATE SODIUM 100 MG PO CAPS: 100.0000 mg | ORAL_CAPSULE | Freq: Every day | ORAL | Status: DC | PRN

## 2015-08-01 MED ORDER — ACETAMINOPHEN 325 MG PO TABS: 650.0000 mg | ORAL_TABLET | Freq: Four times a day (QID) | ORAL | Status: DC | PRN

## 2015-08-01 MED ORDER — MAGNESIUM OXIDE 400 (241.3 MG) MG PO TABS
400.0000 mg | ORAL_TABLET | Freq: Every day | ORAL | Status: DC
  Administered 2015-08-01 – 2015-08-08 (×7): 400 mg via ORAL
  Filled 2015-08-01 (×8): qty 1

## 2015-08-01 MED ORDER — ALUM & MAG HYDROXIDE-SIMETH 200-200-20 MG/5ML PO SUSP: 30.0000 mL | Freq: Four times a day (QID) | ORAL | Status: DC | PRN

## 2015-08-01 MED ORDER — GABAPENTIN 100 MG PO CAPS
100.0000 mg | ORAL_CAPSULE | Freq: Every day | ORAL | Status: DC | PRN
  Filled 2015-08-01: qty 1

## 2015-08-01 MED ORDER — TRAMADOL HCL 50 MG PO TABS
50.0000 mg | ORAL_TABLET | Freq: Four times a day (QID) | ORAL | Status: DC
  Administered 2015-08-01 – 2015-08-04 (×9): 50 mg via ORAL
  Filled 2015-08-01 (×12): qty 1

## 2015-08-01 MED ORDER — IOHEXOL 300 MG/ML  SOLN
50.0000 mL | Freq: Once | INTRAMUSCULAR | Status: DC | PRN
  Administered 2015-08-01: 50 mL via ORAL
  Filled 2015-08-01: qty 50

## 2015-08-01 MED ORDER — METRONIDAZOLE IN NACL 5-0.79 MG/ML-% IV SOLN
500.0000 mg | Freq: Three times a day (TID) | INTRAVENOUS | Status: DC
  Administered 2015-08-01 – 2015-08-03 (×5): 500 mg via INTRAVENOUS
  Filled 2015-08-01 (×5): qty 100

## 2015-08-01 MED ORDER — ACETAMINOPHEN 325 MG PO TABS
650.0000 mg | ORAL_TABLET | Freq: Once | ORAL | Status: AC
  Administered 2015-08-01: 650 mg via ORAL
  Filled 2015-08-01: qty 2

## 2015-08-01 MED ORDER — DIPHENOXYLATE-ATROPINE 2.5-0.025 MG PO TABS: 1.0000 | ORAL_TABLET | ORAL | Status: DC | PRN

## 2015-08-01 MED ORDER — ACETAMINOPHEN 650 MG RE SUPP: 650.0000 mg | Freq: Four times a day (QID) | RECTAL | Status: DC | PRN

## 2015-08-01 MED ORDER — PANTOPRAZOLE SODIUM 40 MG PO TBEC
40.0000 mg | DELAYED_RELEASE_TABLET | Freq: Every evening | ORAL | Status: DC
  Administered 2015-08-01 – 2015-08-02 (×2): 40 mg via ORAL
  Filled 2015-08-01 (×2): qty 1

## 2015-08-01 MED ORDER — TRAMADOL HCL 50 MG PO TABS
100.0000 mg | ORAL_TABLET | Freq: Four times a day (QID) | ORAL | Status: DC | PRN
  Administered 2015-08-07: 100 mg via ORAL
  Filled 2015-08-01 (×2): qty 2

## 2015-08-01 MED ORDER — SODIUM CHLORIDE 0.9 % IV BOLUS (SEPSIS)
1000.0000 mL | INTRAVENOUS | Status: AC
  Administered 2015-08-01 (×2): 1000 mL via INTRAVENOUS

## 2015-08-01 MED ORDER — POTASSIUM CHLORIDE CRYS ER 10 MEQ PO TBCR
10.0000 meq | EXTENDED_RELEASE_TABLET | Freq: Every day | ORAL | Status: DC
  Administered 2015-08-02 (×2): 10 meq via ORAL
  Filled 2015-08-01: qty 0.5

## 2015-08-01 MED ORDER — METRONIDAZOLE IN NACL 5-0.79 MG/ML-% IV SOLN
500.0000 mg | Freq: Once | INTRAVENOUS | Status: AC
  Administered 2015-08-01: 500 mg via INTRAVENOUS
  Filled 2015-08-01: qty 100

## 2015-08-01 MED ORDER — CEFTRIAXONE SODIUM 1 G IJ SOLR
1.0000 g | Freq: Once | INTRAMUSCULAR | Status: AC
  Administered 2015-08-01: 1 g via INTRAVENOUS

## 2015-08-01 MED ORDER — SODIUM CHLORIDE 0.9 % IV SOLN
INTRAVENOUS | Status: DC
  Administered 2015-08-01: 1000 mL via INTRAVENOUS

## 2015-08-01 MED ORDER — BOOST PLUS PO LIQD
237.0000 mL | Freq: Three times a day (TID) | ORAL | Status: DC
Start: 2015-08-02 — End: 2015-08-08
  Administered 2015-08-02 (×3): 237 mL via ORAL
  Filled 2015-08-01 (×21): qty 237

## 2015-08-01 MED ORDER — DEXTROSE 5 % IV SOLN
1.0000 g | INTRAVENOUS | Status: DC
  Administered 2015-08-02: 1 g via INTRAVENOUS
  Filled 2015-08-01 (×2): qty 10

## 2015-08-01 MED ORDER — HYDROMORPHONE HCL 1 MG/ML IJ SOLN
0.5000 mg | INTRAMUSCULAR | Status: DC | PRN
  Administered 2015-08-01: 1 mg via INTRAVENOUS
  Administered 2015-08-05: 0.5 mg via INTRAVENOUS
  Administered 2015-08-05 – 2015-08-07 (×4): 1 mg via INTRAVENOUS
  Filled 2015-08-01 (×6): qty 1

## 2015-08-01 NOTE — ED Notes (Signed)
Bed: RESB Expected date:  Expected time:  Means of arrival:  Comments: Pt from [facility] 

## 2015-08-01 NOTE — ED Notes (Signed)
BLOOD OBTAINED IN CANCER CENTER.

## 2015-08-01 NOTE — ED Notes (Signed)
Pt brought over from cancer center, tachycardic, with fever. Pt went to cancer center today c/o weakness and stool coming from urethra since Wednesday. Pt's last chemo treatment was in December. A&Ox4 and ambulatory.

## 2015-08-01 NOTE — ED Notes (Signed)
Was unable to collect urine sample from pt. Doctor has been informed

## 2015-08-01 NOTE — ED Notes (Signed)
Bed: WA13 Expected date:  Expected time:  Means of arrival:  Comments: RES  b 

## 2015-08-01 NOTE — Telephone Encounter (Signed)
Returning pt call from earlier to [facility]. She has rectovaginal fistula. Temp was 101 with chills yesterday. She says none today but "they usually occur in the afternoon". Per Dr Edwyna Shell will set up appt w/labs and [facility]. Pt is aware and expecting a call from the scheduler

## 2015-08-01 NOTE — ED Notes (Signed)
EDP MADE AWARE OF BLOOD COLLECTION IN [facility]CER CENTER

## 2015-08-01 NOTE — ED Notes (Addendum)
Pt being sent by [facility] r/o urosepsis.  C/o tachycardia and fever starting last night, leaking occult positive stool through urethra x 5 days, and bladder incontinence x 6 weeks.  Hx of cervical CA w/ mets to pelvis.  Last chemo 12/7.   [facility] performed CBC and CMP.

## 2015-08-01 NOTE — H&P (Signed)
[facility] Admission History and Physical       Stacy Webster [id number] DOB: 05/06/1961 DOA: 08/01/2015  Referring physician: EDP PCP: [name], MD  Specialists:   Chief Complaint:  Fever  HPI: Stacy Webster is a 55 y.o. female with a history of Metastatic Cervical Cancer (dx 04/2014) S/P Radiation and Chemo Rx who was sent from the [facility] to the ED due to Fever to 101.3, and low grade fevers x 1 month.  She reports that she started passing fecal matter from her vagina 3 days ago.    She was tachycardic in the 140's on arrival to the ED and was febrile and had 8/10 Pelvic pain.   A Sepsis workup was initiated, and she was placed on IV Rocephin and Metronidazole.   Her tachycardia improved, and Urology was consulted to see her.   She was referred for admission   Review of Systems:  Constitutional: No Weight Loss, No Weight Gain, Night Sweats, +Fevers, +Chills, Dizziness, Light Headedness, Fatigue, or Generalized Weakness HEENT: No Headaches, Difficulty Swallowing,Tooth/Dental Problems,Sore Throat,  No Sneezing, Rhinitis, Ear Ache, Nasal Congestion, or Post Nasal Drip,  Cardio-vascular:  No Chest pain, Orthopnea, PND, Edema in Lower Extremities, Anasarca, Dizziness, Palpitations  Resp: No Dyspnea, No DOE, No Productive Cough, No Non-Productive Cough, No Hemoptysis, No Wheezing.    GI: No Heartburn, Indigestion, +Abdominal Pain, Nausea, Vomiting, Diarrhea, Constipation, Hematemesis, Hematochezia, Melena, Change in Bowel Habits,  +Loss of Appetite  GU: No Dysuria, No Change in Color of Urine, No Urgency or Urinary Frequency, No Flank pain.  Musculoskeletal: No Joint Pain or Swelling, No Decreased Range of Motion, No Back Pain.  Neurologic: No Syncope, No Seizures, Muscle Weakness, Paresthesia, Vision Disturbance or Loss, No Diplopia, No Vertigo, No Difficulty Walking,  Skin: No Rash or Lesions. Psych: No Change in Mood or Affect, No Depression or Anxiety, No  Memory loss, No Confusion, or Hallucinations   Past Medical History  Diagnosis Date  . Hydronephrosis, right   . Ureteral stricture, right   . History of septic shock     05/ 2016  due to Peptostriptococcus/  right hydronephrosis and obstruction----  resolved  . 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 Mild intermittent asthma   . History of Clostridium difficile     10-15-2014  resolved  . Left rotator cuff tear arthropathy   . White coat syndrome without hypertension   . GERD (gastroesophageal reflux disease)   . Frequency of urination   . Anemia associated with chemotherapy     Antineoplastic chemo  . Recurrent cervical cancer [facility]) oncologist-  dr Rossi/ dr Marko Plume    dx 12/ 2015---  Radiation and Chemo (07-06-2014 to 09-03-2014)  now recurrent ,  Stage IIB  w/ Mets to pelvic/ retroperitoneum---  CHEMOTHERAPY CURRENTLY  . 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 Cervical cancer ([place])   . Chronic constipation   . Urinary incontinence   . PONV (postoperative nausea and vomiting)     remote history -not recent     Past Surgical History  Procedure Laterality Date  . Cystoscopy w/ ureteral stent placement Right 10/13/2014    Procedure: CYSTOSCOPY WITH RETROGRADE PYELOGRAM/URETERAL STENT PLACEMENT;  Surgeon: Festus Aloe, MD;  Location: [facility];  Service: Urology;  Laterality: Right;  . Transthoracic echocardiogram  10-13-2014    mild LVH,  ef 55-60%/  mild TR  . Tonsillectomy and adenoidectomy  age 64    age 79--  redo--  Adenoidectomy  . Laparoscopic cholecystectomy  1990's  . Cystoscopy with ureteroscopy and stent placement Right 02/18/2015    Procedure: CYSTO WITH RIGHT URETERAL STENT EXCHANGE, RIGHT URETEROSCOPY ;  Surgeon: Festus Aloe, MD;  Location: [facility];  Service: Urology;  Laterality: Right;  . Cystoscopy with biopsy N/A 02/18/2015    Procedure: CYSTOSCOPY WITH  BLADDER  BIOPSY;  Surgeon: Festus Aloe, MD;  Location:  [facility];  Service: Urology;  Laterality: N/A;  . Cystoscopy w/ retrogrades Right 02/18/2015    Procedure: CYSTOSCOPY WITH RETROGRADE PYELOGRAM;  Surgeon: Festus Aloe, MD;  Location: [facility];  Service: Urology;  Laterality: Right;  . Dilation and curettage of uterus N/A 03/25/2015    Procedure: DILATATION AND CURETTAGE WITH ULTRASOUND;  Surgeon: Everitt Amber, MD;  Location: [facility];  Service: Gynecology;  Laterality: N/A;  . Cystoscopy/retrograde/ureteroscopy Bilateral 06/21/2015    Procedure: CYSTOSCOPY; EXAM UNDER ANESTHESIA; BLADDER BIOPSIES WITH FULGARATION; BILATERAL RETROGRADE PYLOGRAM; RIGHT URETERAL STENT EXCHANGE AND LEFT URETERAL STENT PLACEMENT;  Surgeon: Festus Aloe, MD;  Location: [facility];  Service: Urology;  Laterality: Bilateral;      Prior to Admission medications   Medication Sig Start Date End Date Taking? Authorizing Provider  albuterol (PROVENTIL HFA;VENTOLIN HFA) 108 (90 BASE) MCG/ACT inhaler Inhale 2 puffs into the lungs every 6 (six) hours as needed for wheezing or shortness of breath. Reported on 07/21/2015   Yes Historical Provider, MD  diphenoxylate-atropine (LOMOTIL) 2.5-0.025 MG per tablet Take 1 tablet by mouth as needed for diarrhea or loose stools. Reported on 07/21/2015 08/10/14  Yes Historical Provider, MD  docusate sodium (COLACE) 100 MG capsule Take 100 mg by mouth daily as needed for mild constipation. Reported on 07/21/2015 11/23/14  Yes Historical Provider, MD  gabapentin (NEURONTIN) 100 MG capsule Take 100 mg by mouth daily as needed (pain).   Yes Historical Provider, MD  HEMOCYTE 324 (106 FE) MG TABS Take 325 mg by mouth daily. Take 1 tablet daily on empty stomach with Vitamin C tablet 11/18/14  Yes Lennis Marion Downer, MD  ibuprofen (ADVIL,MOTRIN) 200 MG tablet Take 400 mg by mouth every 6 (six) hours as needed. Reported on 06/23/2015   Yes Historical Provider, MD  lactose free nutrition (BOOST PLUS) LIQD  Take 237 mLs by mouth 3 (three) times daily with meals. 10/18/14  Yes Thurnell Lose, MD  LORazepam (ATIVAN) 1 MG tablet Place 1/2 - 1 tablet under the tongue or swallow evry 4-6 hrs as needed for nausea 07/21/15  Yes Lennis Marion Downer, MD  Magnesium Oxide 500 MG CAPS Take 500 mg by mouth daily.   Yes Historical Provider, MD  pantoprazole (PROTONIX) 40 MG tablet Take 1 tablet (40 mg total) by mouth daily. Patient taking differently: Take 40 mg by mouth every evening.  06/09/15  Yes Lennis Marion Downer, MD  potassium chloride (K-DUR,KLOR-CON) 10 MEQ tablet Take 10 mEq by mouth daily.   Yes Historical Provider, MD  traMADol (ULTRAM) 50 MG tablet Take 1-2 tablets (50-100 mg total) by mouth every 8 (eight) hours as needed. 07/21/15  Yes Lennis Marion Downer, MD  vitamin C (ASCORBIC ACID) 500 MG tablet Take 500 mg by mouth daily.   Yes Historical Provider, MD     Allergies  Allergen Reactions  . Morphine And Related Rash    Per pt, morphine also makes her vomit  . Zofran [Ondansetron Hcl] Rash    Veins with erythema after zofran infused.  . Codeine Nausea Only  .  Hydrocodone Nausea And Vomiting  . Peach [Prunus Persica] Swelling and Rash    Social History:  reports that she has never smoked. She has never used smokeless tobacco. She reports that she does not drink alcohol or use illicit drugs.    No family history on file.     Physical Exam:  GEN:  Pleasant Thin  55 y.o. Caucasian female examined and in no acute distress; cooperative with exam Filed Vitals:   08/01/15 1430 08/01/15 1444 08/01/15 1500 08/01/15 1821  BP: 114/66  114/71 121/64  Pulse: 99  95 97  Temp:  100.4 F (38 C)  98.5 F (36.9 C)  TempSrc:  Oral  Oral  Resp: 14  14 14   Height:      Weight:      SpO2: 97%  100% 100%   Blood pressure 121/64, pulse 97, temperature 98.5 F (36.9 C), temperature source Oral, resp. rate 14, height 5\' 4"  (1.626 m), weight 61.689 kg (136 lb), last menstrual period 11/05/2011, SpO2 100  %. PSYCH: She is alert and oriented x4; does not appear anxious does not appear depressed; affect is normal HEENT: Normocephalic and Atraumatic, Mucous membranes pink; PERRLA; EOM intact; Fundi:  Benign;  No scleral icterus, Nares: Patent, Oropharynx: Clear, Fair Dentition,    Neck:  FROM, No Cervical Lymphadenopathy nor Thyromegaly or Carotid Bruit; No JVD; Breasts:: Not examined CHEST WALL: No tenderness CHEST: Normal respiration, clear to auscultation bilaterally HEART: Regular rate and rhythm; no murmurs rubs or gallops BACK: No kyphosis or scoliosis; No CVA tenderness ABDOMEN: Positive Bowel Sounds, Scaphoid, Lower ABD Tenderness, No Rebound or Guarding; No Masses, No Organomegaly Rectal Exam: Not done EXTREMITIES: No Cyanosis, Clubbing, or Edema; No Ulcerations. Genitalia: not examined PULSES: 2+ and symmetric SKIN: Normal hydration no rash or ulceration CNS:  Alert and Oriented x 4, No Focal Deficits Vascular: pulses palpable throughout    Labs on Admission:  Basic Metabolic Panel:  Recent Labs Lab 08/01/15 1020  NA 135*  K 4.0  CO2 28  GLUCOSE 148*  BUN 16.9  CREATININE 0.9  CALCIUM 9.3   Liver Function Tests:  Recent Labs Lab 08/01/15 1020  AST 20  ALT 24  ALKPHOS 86  BILITOT <0.30  PROT 7.5  ALBUMIN 2.2*   No results for input(s): LIPASE, AMYLASE in the last 168 hours. No results for input(s): AMMONIA in the last 168 hours. CBC:  Recent Labs Lab 08/01/15 1019  WBC 15.2*  NEUTROABS 13.5*  HGB 9.6*  HCT 30.1*  MCV 87.7  PLT 640*   Cardiac Enzymes: No results for input(s): CKTOTAL, CKMB, CKMBINDEX, TROPONINI in the last 168 hours.  BNP (last 3 results) No results for input(s): BNP in the last 8760 hours.  ProBNP (last 3 results) No results for input(s): PROBNP in the last 8760 hours.  CBG: No results for input(s): GLUCAP in the last 168 hours.  Radiological Exams on Admission: Ct Abdomen Pelvis Wo Contrast  08/01/2015  CLINICAL DATA:   Sepsis, high grade fever, passing fecal material per urethra, personal history of cervical cancer with chemotherapy and radiation as well as vaginal fistula, personal history of bilateral ureteral stents, concern for colo vesicular fistula EXAM: CT ABDOMEN AND PELVIS WITHOUT CONTRAST TECHNIQUE: Multidetector CT imaging of the abdomen and pelvis was performed following the standard protocol without IV contrast. COMPARISON:  06/09/2015 FINDINGS: Lower chest: The visualized portions of the lung bases and heart are clear. Hepatobiliary: Status post cholecystectomy. The liver appears normal. Pancreas: Normal Spleen: Normal  Adrenals/Urinary Tract: Adrenal glands are normal. Right kidney is smaller than the left. Perinephric right fluid collection unchanged. Bilateral ureteral stents present. Bladder is decompressed with wall thickening and there is air within the bladder. Stomach/Bowel: The stomach is normal. Small bowel is normal except for stable wall thickening of the distal ileum. There is significant diffuse rectal wall thickening, slightly more prominent when compared to the prior study. Wall thickening also involves nearly the entire sigmoid colon, which represents a change from prior study. Vascular/Lymphatic: No evidence of aortic dilatation. No significant retroperitoneal adenopathy. Index right inguinal lymph node previously measured 13 mm and measures 13 mm once again today. Left inguinal lymph node series 2, image number 84 is larger, measuring 16 mm on the current examination. It was not enlarged previously. On image number 78 left inguinal adenopathy measures 18 mm in diameter as opposed to 7 mm previously. Reproductive: There are unchanged calcifications involving the endometrium and are again likely related to fibroids. When compared to the prior study there is increased air within the endometrial canal. There is also again air in the region of the vaginal fornices and cervical canal. Oral contrast is  also seen into the endocervical canal. Seen best on sagittal images, there is a thin tract of contrast extending anteriorly from the cervix to the bladder. This is seen best on sagittal series image number 60. Stable bilateral ovarian cysts or follicles. Other: No significant ascites Musculoskeletal: No acute musculoskeletal findings IMPRESSION: 1.  Increased inguinal adenopathy 2.  Stable radiation enteritis 3. Stable right perinephric fluid and ureteral stents. No hydronephrosis. 4. The presence of abnormal air is noted within the endometrium and within the bladder, implying the possibility of infection and also of fistula to both structures. On this examination which was performed with oral contrast only, the presence of contrast within the cervical canal confirms known rectovaginal fistula. Seen best on the sagittal images, there also appears to be fistula extending anteriorly from the cervical region to the bladder. 5. Increased wall thickening of the rectum and most of the sigmoid colon, suggesting possibility of radiation colitis. Electronically Signed   By: Skipper Cliche M.D.   On: 08/01/2015 17:33   Dg Chest 2 View  08/01/2015  CLINICAL DATA:  Fever for 3 weeks getting progressively worse with weakness EXAM: CHEST  2 VIEW COMPARISON:  03/23/2015 FINDINGS: The heart size and mediastinal contours are within normal limits. Both lungs are clear. The visualized skeletal structures are unremarkable. Lordotic positioning and mild rotation appears to cause subtle prominence of the ascending aorta. IMPRESSION: No active cardiopulmonary disease. Electronically Signed   By: Skipper Cliche M.D.   On: 08/01/2015 14:03       Assessment/Plan:      55 y.o. female with  Principal Problem:    Sepsis ([place])    IV Rocephin and Metronidazole Rx    IVFs    Urology consulted to see tonight      Active Problems:    Cervical cancer ([place]) Metastatic cancer to pelvis [facility])    Consult Oncology in AM        Protein calorie malnutrition [facility])    Nutrition consult for caloric Needs    Consider Appetite Stimulant Rx      Fistula    Urology consulted for surgical Option      Anemia    Continue Hemocyte Rx    DVT Prophylaxis    Lovenox      Code Status:     FULL CODE  Family Communication:   Family at Bedside    Disposition Plan:    Inpatient  Status        Time spent: [facility] Pager [phone]   If [place] Please Contact the Day Rounding Team MD for [facility]  If 7PM-7AM, Please Contact Night-Floor Coverage  [email or web address] Password [email or web address] 08/01/2015, 6:44 PM     ADDENDUM:   Patient was seen and examined on 08/01/2015

## 2015-08-01 NOTE — Consult Note (Signed)
Urology Consult   Physician requesting consult: Dr. Arnoldo Morale  Reason for consult: Vesicovaginal fistula  History of Present Illness: Stacy Webster is a 55 y.o. female with a history of recurrent cervical cancer s/p chemoradiation for initial therapy under the care of Dr. [name] and subsequent systemic chemotherapy (last cycle completed in early December with no evidence of disease recurrence). She is followed by [name] for bilateral ureteral obstruction s/p bilateral stent placement (last performed on 06/21/15) and a known large vesicovaginal fistula.  Biopsies from fistula tract on 1/24 were benign. She began noticing stool from her vagina last Wednesday and she developed progressively increased fever to 101 today and was evaluated at the cancer center and sent to the [facility] ED for admission to [facility]. She also complains of bilateral pain along her iliac bones. 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 No hydronephrosis and bilateral stents are in appropriate position.  She has been hemodynamically stable.  To my knowledge, General Surgery has not been consulted yet.     Past Medical History  Diagnosis Date  . Hydronephrosis, right   . Ureteral stricture, right   . History of septic shock     05/ 2016  due to Peptostriptococcus/  right hydronephrosis and obstruction----  resolved  . 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 Mild intermittent asthma   . History of Clostridium difficile     10-15-2014  resolved  . Left rotator cuff tear arthropathy   . White coat syndrome without hypertension   . GERD (gastroesophageal reflux disease)   . Frequency of urination   . Anemia associated with chemotherapy     Antineoplastic chemo  . Recurrent cervical cancer [facility]) oncologist-  dr Rossi/ dr Marko Plume    dx 12/ 2015---  Radiation and Chemo (07-06-2014 to  09-03-2014)  now recurrent ,  Stage IIB  w/ Mets to pelvic/ retroperitoneum---  CHEMOTHERAPY CURRENTLY  . 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 Cervical cancer ([place])   . Chronic constipation   . Urinary incontinence   . PONV (postoperative nausea and vomiting)     remote history -not recent    Past Surgical History  Procedure Laterality Date  . Cystoscopy w/ ureteral stent placement Right 10/13/2014    Procedure: CYSTOSCOPY WITH RETROGRADE PYELOGRAM/URETERAL STENT PLACEMENT;  Surgeon: Festus Aloe, MD;  Location: [facility];  Service: Urology;  Laterality: Right;  . Transthoracic echocardiogram  10-13-2014    mild LVH,  ef 55-60%/  mild TR  . Tonsillectomy and adenoidectomy  age 35    age 75--  [facility]o-- Adenoidectomy  . Laparoscopic cholecystectomy  1990's  . Cystoscopy with ureteroscopy and stent placement Right 02/18/2015    Procedure: CYSTO WITH RIGHT URETERAL STENT EXCHANGE, RIGHT URETEROSCOPY ;  Surgeon: Festus Aloe, MD;  Location: [facility];  Service: Urology;  Laterality: Right;  . Cystoscopy with biopsy N/A 02/18/2015    Procedure: CYSTOSCOPY WITH  BLADDER  BIOPSY;  Surgeon: Festus Aloe, MD;  Location: [facility];  Service: Urology;  Laterality: N/A;  . Cystoscopy w/ retrogrades Right 02/18/2015    Procedure: CYSTOSCOPY WITH RETROGRADE PYELOGRAM;  Surgeon: Festus Aloe, MD;  Location: [facility];  Service: Urology;  Laterality: Right;  . Dilation and curettage of uterus N/A 03/25/2015    Procedure: DILATATION AND CURETTAGE WITH ULTRASOUND;  Surgeon: Everitt Amber, MD;  Location: [facility];  Service: Gynecology;  Laterality: N/A;  . Cystoscopy/retrograde/ureteroscopy Bilateral 06/21/2015    Procedure: CYSTOSCOPY; EXAM UNDER ANESTHESIA; BLADDER BIOPSIES WITH FULGARATION; BILATERAL RETROGRADE PYLOGRAM; RIGHT URETERAL STENT EXCHANGE AND LEFT URETERAL STENT PLACEMENT;  Surgeon: Festus Aloe, MD;  Location: [facility];  Service: Urology;  Laterality: Bilateral;     Current Hospital Medications:  Home meds:    Medication List    ASK your doctor about these medications        albuterol 108 (90 Base) MCG/ACT inhaler  Commonly known as:  PROVENTIL HFA;VENTOLIN HFA  Inhale 2 puffs into the lungs every 6 (six) hours as needed for wheezing or shortness of breath. Reported on 07/21/2015     diphenoxylate-atropine 2.5-0.025 MG tablet  Commonly known as:  LOMOTIL  Take 1 tablet by mouth as needed for diarrhea or loose stools. Reported on 07/21/2015     docusate sodium 100 MG capsule  Commonly known as:  COLACE  Take 100 mg by mouth daily as needed for mild constipation. Reported on 07/21/2015     gabapentin 100 MG capsule  Commonly known as:  NEURONTIN  Take 100 mg by mouth daily as needed (pain).     HEMOCYTE 324 (106 Fe) MG Tabs tablet  Generic drug:  Ferrous Fumarate  Take 325 mg by mouth daily. Take 1 tablet daily on empty stomach with Vitamin C tablet     ibuprofen 200 MG tablet  Commonly known as:  ADVIL,MOTRIN  Take 400 mg by mouth every 6 (six) hours as needed. Reported on 06/23/2015     lactose free nutrition Liqd  Take 237 mLs by mouth 3 (three) times daily with meals.     LORazepam 1 MG tablet  Commonly known as:  ATIVAN  Place 1/2 - 1 tablet under the tongue or swallow evry 4-6 hrs as needed for nausea     Magnesium Oxide 500 MG Caps  Take 500 mg by mouth daily.     pantoprazole 40 MG tablet  Commonly known as:  PROTONIX  Take 1 tablet (40 mg total) by mouth daily.     potassium chloride 10 MEQ tablet  Commonly known as:  K-DUR,KLOR-CON  Take 10 mEq by mouth daily.     traMADol 50 MG tablet  Commonly known as:  ULTRAM  Take 1-2 tablets (50-100 mg total) by mouth every 8 (eight) hours as needed.     vitamin C 500 MG tablet  Commonly known as:  ASCORBIC ACID  Take 500 mg by mouth daily.        Scheduled Meds: . [START ON 08/02/2015] cefTRIAXone (ROCEPHIN)  IV  1 g  Intravenous Q24H  . enoxaparin (LOVENOX) injection  40 mg Subcutaneous Q24H  . [START ON 08/02/2015] lactose free nutrition  237 mL Oral TID WC  . magnesium oxide  400 mg Oral Daily  . metronidazole  500 mg Intravenous Q8H  . pantoprazole  40 mg Oral QPM  . potassium chloride  10 mEq Oral Daily  . traMADol  50 mg Oral 4 times per day   Continuous Infusions: . sodium chloride 1,000 mL (08/01/15 2039)   PRN Meds:.acetaminophen **OR** acetaminophen, alum & mag hydroxide-simeth, diphenoxylate-atropine, docusate sodium, gabapentin, HYDROmorphone (DILAUDID) injection, iohexol, traMADol  Allergies:  Allergies  Allergen Reactions  . Morphine And Related Rash    Per pt, morphine also makes her vomit  . Zofran [Ondansetron Hcl] Rash    Veins with erythema after zofran infused.  . Codeine Nausea Only  .  Hydrocodone Nausea And Vomiting  . Peach [Prunus Persica] Swelling and Rash    No family history on file.  Social History:  reports that she has never smoked. She has never used smokeless tobacco. She reports that she does not drink alcohol or use illicit drugs.  ROS: A complete review of systems was performed.  All systems are negative except for pertinent findings as noted.  Physical Exam:  Vital signs in last 24 hours: Temp:  [98.5 F (36.9 C)-101.3 F (38.5 C)] 99.8 F (37.7 C) (03/06 2022) Pulse Rate:  [95-136] 110 (03/06 2022) Resp:  [14-18] 18 (03/06 2022) BP: (106-124)/(63-72) 108/63 mmHg (03/06 2022) SpO2:  [97 %-100 %] 99 % (03/06 2022) Weight:  [61.689 kg (136 lb)-61.78 kg (136 lb 3.2 oz)] 61.689 kg (136 lb) (03/06 1330) Constitutional:  Alert and oriented, No acute distress Cardiovascular: Regular rate and rhythm, No JVD Respiratory: Normal respiratory effort, Lungs clear bilaterally GI: Abdomen is soft, nontender, nondistended, no abdominal masses GU: No CVA tenderness Lymphatic: No lymphadenopathy Neurologic: Grossly intact, no focal deficits Psychiatric: Normal  mood and affect  Laboratory Data:   Recent Labs  08/01/15 1019  WBC 15.2*  HGB 9.6*  HCT 30.1*  PLT 640*     Recent Labs  08/01/15 1020  NA 135*  K 4.0  GLUCOSE 148*  BUN 16.9  CALCIUM 9.3  CREATININE 0.9     Results for orders placed or performed during the hospital encounter of 08/01/15 (from the past 24 hour(s))  I-Stat CG4 Lactic Acid, ED     Status: None   Collection Time: 08/01/15  1:33 PM  Result Value Ref Range   Lactic Acid, Venous 0.93 0.5 - 2.0 mmol/L   No results found for this or any previous visit (from the past 240 hour(s)).  Renal Function:  Recent Labs  08/01/15 1020  CREATININE 0.9   Estimated Creatinine Clearance: 61.7 mL/min (by C-G formula based on Cr of 0.9).  Radiologic Imaging: Ct Abdomen Pelvis Wo Contrast  08/01/2015  CLINICAL DATA:  Sepsis, high grade fever, passing fecal material per urethra, personal history of cervical cancer with chemotherapy and radiation as well as vaginal fistula, personal history of bilateral ureteral stents, concern for colo vesicular fistula EXAM: CT ABDOMEN AND PELVIS WITHOUT CONTRAST TECHNIQUE: Multidetector CT imaging of the abdomen and pelvis was performed following the standard protocol without IV contrast. COMPARISON:  06/09/2015 FINDINGS: Lower chest: The visualized portions of the lung bases and heart are clear. Hepatobiliary: Status post cholecystectomy. The liver appears normal. Pancreas: Normal Spleen: Normal Adrenals/Urinary Tract: Adrenal glands are normal. Right kidney is smaller than the left. Perinephric right fluid collection unchanged. Bilateral ureteral stents present. Bladder is decompressed with wall thickening and there is air within the bladder. Stomach/Bowel: The stomach is normal. Small bowel is normal except for stable wall thickening of the distal ileum. There is significant diffuse rectal wall thickening, slightly more prominent when compared to the prior study. Wall thickening also involves  nearly the entire sigmoid colon, which represents a change from prior study. Vascular/Lymphatic: No evidence of aortic dilatation. No significant retroperitoneal adenopathy. Index right inguinal lymph node previously measured 13 mm and measures 13 mm once again today. Left inguinal lymph node series 2, image number 84 is larger, measuring 16 mm on the current examination. It was not enlarged previously. On image number 78 left inguinal adenopathy measures 18 mm in diameter as opposed to 7 mm previously. Reproductive: There are unchanged calcifications involving the endometrium and  are again likely related to fibroids. When compared to the prior study there is increased air within the endometrial canal. There is also again air in the region of the vaginal fornices and cervical canal. Oral contrast is also seen into the endocervical canal. Seen best on sagittal images, there is a thin tract of contrast extending anteriorly from the cervix to the bladder. This is seen best on sagittal series image number 60. Stable bilateral ovarian cysts or follicles. Other: No significant ascites Musculoskeletal: No acute musculoskeletal findings IMPRESSION: 1.  Increased inguinal adenopathy 2.  Stable radiation enteritis 3. Stable right perinephric fluid and ureteral stents. No hydronephrosis. 4. The presence of abnormal air is noted within the endometrium and within the bladder, implying the possibility of infection and also of fistula to both structures. On this examination which was performed with oral contrast only, the presence of contrast within the cervical canal confirms known rectovaginal fistula. Seen best on the sagittal images, there also appears to be fistula extending anteriorly from the cervical region to the bladder. 5. Increased wall thickening of the rectum and most of the sigmoid colon, suggesting possibility of radiation colitis. Electronically Signed   By: Skipper Cliche M.D.   On: 08/01/2015 17:33   Dg Chest  2 View  08/01/2015  CLINICAL DATA:  Fever for 3 weeks getting progressively worse with weakness EXAM: CHEST  2 VIEW COMPARISON:  03/23/2015 FINDINGS: The heart size and mediastinal contours are within normal limits. Both lungs are clear. The visualized skeletal structures are unremarkable. Lordotic positioning and mild rotation appears to cause subtle prominence of the ascending aorta. IMPRESSION: No active cardiopulmonary disease. Electronically Signed   By: Skipper Cliche M.D.   On: 08/01/2015 14:03    I independently reviewed the above imaging studies.  Impression/Recommendation: 1. Bilateral ureteral obstruction: Ureteral stents in appropriate position and recently changed.  No indication for stent change currently.   2. Vesicovaginal fistula: No acute intervention indicated at this time.  Patient will likely ultimately need pelvic exenteration and urinary diversion for definitive therapy in the future pending her prognosis regarding her malignancy and stability/optimization of her medical situation.   3. Probable rectovaginal fistula: General surgery should be consulted for evaluation and management.  I will notify [name] of patient's admission for follow up of this consult.  Micha Erck,LES 08/01/2015, 9:18 PM  Pryor Curia. MD   CC: Dr. Arnoldo Morale

## 2015-08-01 NOTE — ED Notes (Signed)
MD at bedside. SPEAKING WITH PT AND FAMILY

## 2015-08-01 NOTE — Telephone Encounter (Signed)
Spoke with patient re [place] at 10 am.

## 2015-08-01 NOTE — ED Notes (Addendum)
CODE SEPSIS -EDP  NANAVATI MADE AWARE

## 2015-08-01 NOTE — ED Provider Notes (Signed)
CSN: [id number]     Arrival date & time 08/01/15  1242 History   First MD Initiated Contact with Patient 08/01/15 1335     Chief Complaint  Patient presents with  . Weakness     (Consider location/radiation/quality/duration/timing/severity/associated sxs/prior Treatment) HPI Comments: Pt with cervical CA hx and vaginal fistula comes in with cc of weakness. Pt sent here from the cancer center for concerns of sepsis. Per patient, she has been feeling weaker the last few days and started having high grade fever on weekend. Pt reports that she has been having urinary problems for a while now, but starting last Wednesday, she has been having fecal material per urethra. Pt has some buring around the vaginal area. She is s/p chemo and radiation.   ROS 10 Systems reviewed and are negative for acute change except as noted in the HPI.     Patient is a 55 y.o. female presenting with weakness. The history is provided by the patient.  Weakness    Past Medical History  Diagnosis Date  . Hydronephrosis, right   . Ureteral stricture, right   . History of septic shock     05/ 2016  due to Peptostriptococcus/  right hydronephrosis and obstruction----  resolved  . 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 Mild intermittent asthma   . History of Clostridium difficile     10-15-2014  resolved  . Left rotator cuff tear arthropathy   . White coat syndrome without hypertension   . GERD (gastroesophageal reflux disease)   . Frequency of urination   . Anemia associated with chemotherapy     Antineoplastic chemo  . Recurrent cervical cancer [facility]) oncologist-  dr Rossi/ dr Marko Plume    dx 12/ 2015---  Radiation and Chemo (07-06-2014 to 09-03-2014)  now recurrent ,  Stage IIB  w/ Mets to pelvic/ retroperitoneum---  CHEMOTHERAPY CURRENTLY  . 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 Cervical cancer ([place])   . Chronic constipation   . Urinary incontinence   . PONV (postoperative nausea  and vomiting)     remote history -not recent   Past Surgical History  Procedure Laterality Date  . Cystoscopy w/ ureteral stent placement Right 10/13/2014    Procedure: CYSTOSCOPY WITH RETROGRADE PYELOGRAM/URETERAL STENT PLACEMENT;  Surgeon: Festus Aloe, MD;  Location: [facility];  Service: Urology;  Laterality: Right;  . Transthoracic echocardiogram  10-13-2014    mild LVH,  ef 55-60%/  mild TR  . Tonsillectomy and adenoidectomy  age 32    age 49--  [facility]-- Adenoidectomy  . Laparoscopic cholecystectomy  1990's  . Cystoscopy with ureteroscopy and stent placement Right 02/18/2015    Procedure: CYSTO WITH RIGHT URETERAL STENT EXCHANGE, RIGHT URETEROSCOPY ;  Surgeon: Festus Aloe, MD;  Location[facility];  Service: Urology;  Laterality: Right;  . Cystoscopy with biopsy N/A 02/18/2015    Procedure: CYSTOSCOPY WITH  BLADDER  BIOPSY;  Surgeon: Festus Aloe, MD;  Location: [facility];  Service: Urology;  Laterality: N/A;  . Cystoscopy w/ retrogrades Right 02/18/2015    Procedure: CYSTOSCOPY WITH RETROGRADE PYELOGRAM;  Surgeon: Festus Aloe, MD;  Location: [facility];  Service: Urology;  Laterality: Right;  . Dilation and curettage of uterus N/A 03/25/2015    Procedure: DILATATION AND CURETTAGE WITH ULTRASOUND;  Surgeon: Everitt Amber, MD;  Location: [facility];  Service: Gynecology;  Laterality: N/A;  . Cystoscopy/retrograde/ureteroscopy Bilateral 06/21/2015    Procedure: CYSTOSCOPY; EXAM UNDER ANESTHESIA;  BLADDER BIOPSIES WITH FULGARATION; BILATERAL RETROGRADE PYLOGRAM; RIGHT URETERAL STENT EXCHANGE AND LEFT URETERAL STENT PLACEMENT;  Surgeon: Festus Aloe, MD;  Location: [facility];  Service: Urology;  Laterality: Bilateral;   No family history on file. Social History  Substance Use Topics  . Smoking status: Never Smoker   . Smokeless tobacco: Never Used  . Alcohol Use: No   OB History    No data available     Review of  Systems  Neurological: Positive for weakness.      Allergies  Morphine and related; Zofran; Codeine; Hydrocodone; and Peach  Home Medications   Prior to Admission medications   Medication Sig Start Date End Date Taking? Authorizing Provider  albuterol (PROVENTIL HFA;VENTOLIN HFA) 108 (90 BASE) MCG/ACT inhaler Inhale 2 puffs into the lungs every 6 (six) hours as needed for wheezing or shortness of breath. Reported on 07/21/2015   Yes Historical Provider, MD  diphenoxylate-atropine (LOMOTIL) 2.5-0.025 MG per tablet Take 1 tablet by mouth as needed for diarrhea or loose stools. Reported on 07/21/2015 08/10/14  Yes Historical Provider, MD  docusate sodium (COLACE) 100 MG capsule Take 100 mg by mouth daily as needed for mild constipation. Reported on 07/21/2015 11/23/14  Yes Historical Provider, MD  gabapentin (NEURONTIN) 100 MG capsule Take 100 mg by mouth daily as needed (pain).   Yes Historical Provider, MD  HEMOCYTE 324 (106 FE) MG TABS Take 325 mg by mouth daily. Take 1 tablet daily on empty stomach with Vitamin C tablet 11/18/14  Yes Lennis Marion Downer, MD  ibuprofen (ADVIL,MOTRIN) 200 MG tablet Take 400 mg by mouth every 6 (six) hours as needed. Reported on 06/23/2015   Yes Historical Provider, MD  lactose free nutrition (BOOST PLUS) LIQD Take 237 mLs by mouth 3 (three) times daily with meals. 10/18/14  Yes Thurnell Lose, MD  LORazepam (ATIVAN) 1 MG tablet Place 1/2 - 1 tablet under the tongue or swallow evry 4-6 hrs as needed for nausea 07/21/15  Yes Lennis Marion Downer, MD  Magnesium Oxide 500 MG CAPS Take 500 mg by mouth daily.   Yes Historical Provider, MD  pantoprazole (PROTONIX) 40 MG tablet Take 1 tablet (40 mg total) by mouth daily. Patient taking differently: Take 40 mg by mouth every evening.  06/09/15  Yes Lennis Marion Downer, MD  potassium chloride (K-DUR,KLOR-CON) 10 MEQ tablet Take 10 mEq by mouth daily.   Yes Historical Provider, MD  traMADol (ULTRAM) 50 MG tablet Take 1-2 tablets (50-100  mg total) by mouth every 8 (eight) hours as needed. 07/21/15  Yes Lennis Marion Downer, MD  vitamin C (ASCORBIC ACID) 500 MG tablet Take 500 mg by mouth daily.   Yes Historical Provider, MD   BP 114/71 mmHg  Pulse 95  Temp(Src) 100.4 F (38 C) (Oral)  Resp 14  Ht 5\' 4"  (1.626 m)  Wt 136 lb (61.689 kg)  BMI 23.33 kg/m2  SpO2 100%  LMP 11/05/2011 Physical Exam  Constitutional: She is oriented to person, place, and time. She appears well-developed.  HENT:  Head: Normocephalic and atraumatic.  Eyes: Conjunctivae and EOM are normal. Pupils are equal, round, and reactive to light.  Neck: Normal range of motion. Neck supple.  Cardiovascular: Regular rhythm and normal heart sounds.   Pulmonary/Chest: Effort normal and breath sounds normal. No respiratory distress.  Abdominal: Soft. Bowel sounds are normal. She exhibits no distension. There is tenderness. There is no rebound and no guarding.  Suprapubic area tenderness  Musculoskeletal: She exhibits no  edema.  Neurological: She is alert and oriented to person, place, and time.  Skin: Skin is warm and dry.  Nursing note and vitals reviewed.   ED Course  Procedures (including critical care time) Labs Review Labs Reviewed  URINE CULTURE  URINALYSIS, ROUTINE W REFLEX MICROSCOPIC (NOT AT [facility])  I-STAT CG4 LACTIC ACID, ED    Imaging Review Dg Chest 2 View  08/01/2015  CLINICAL DATA:  Fever for 3 weeks getting progressively worse with weakness EXAM: CHEST  2 VIEW COMPARISON:  03/23/2015 FINDINGS: The heart size and mediastinal contours are within normal limits. Both lungs are clear. The visualized skeletal structures are unremarkable. Lordotic positioning and mild rotation appears to cause subtle prominence of the ascending aorta. IMPRESSION: No active cardiopulmonary disease. Electronically Signed   By: Skipper Cliche M.D.   On: 08/01/2015 14:03   I have personally reviewed and evaluated these images and lab results as part of my medical  decision-making.   EKG Interpretation None      MDM   Final diagnoses:  None    Pt comes in with cc of abd pain, weakness and fecal material per urine. Hx of cervical CA s/p radiation and chemo and pt has a bladder fistula. Now it seems like she is septic, and has colo-vesicular fistula. We will start antibiotics and fluids. Lactate is normal. CT ordered.     Varney Biles, MD 08/01/15 [phone]

## 2015-08-01 NOTE — ED Notes (Signed)
Patient transported to CT 

## 2015-08-01 NOTE — ED Notes (Signed)
Pt aware of need for urine sample. Pt attempted to obtain. Sample was not enough to collect

## 2015-08-01 NOTE — ED Notes (Signed)
Boost given

## 2015-08-01 NOTE — Progress Notes (Signed)
Utilization Review completed.  Kamarian Sahakian RN CM  

## 2015-08-01 NOTE — Telephone Encounter (Signed)
Incoming call , the called to report that she has had some "urinary" changes since Wednesday July 27, 2015 . Patient states she has noticed "feces" coming out of her vaginal area and now has a fever of "101" with chills. Patient states she last seen by Dr Marko Plume last week and her "counts" are low and wants to know what to do. Writer to update Dr [name] with these new changes.

## 2015-08-02 ENCOUNTER — Encounter: Payer: Self-pay | Admitting: Nurse Practitioner

## 2015-08-02 DIAGNOSIS — C539 Malignant neoplasm of cervix uteri, unspecified: Secondary | ICD-10-CM

## 2015-08-02 DIAGNOSIS — C7989 Secondary malignant neoplasm of other specified sites: Secondary | ICD-10-CM

## 2015-08-02 DIAGNOSIS — R509 Fever, unspecified: Secondary | ICD-10-CM

## 2015-08-02 DIAGNOSIS — C7982 Secondary malignant neoplasm of genital organs: Secondary | ICD-10-CM

## 2015-08-02 DIAGNOSIS — N321 Vesicointestinal fistula: Secondary | ICD-10-CM

## 2015-08-02 DIAGNOSIS — C786 Secondary malignant neoplasm of retroperitoneum and peritoneum: Secondary | ICD-10-CM

## 2015-08-02 DIAGNOSIS — E871 Hypo-osmolality and hyponatremia: Secondary | ICD-10-CM

## 2015-08-02 LAB — CBC
HCT: 23.5 % — ABNORMAL LOW (ref 36.0–46.0)
HEMOGLOBIN: 7.5 g/dL — AB (ref 12.0–15.0)
MCH: 28.5 pg (ref 26.0–34.0)
MCHC: 31.9 g/dL (ref 30.0–36.0)
MCV: 89.4 fL (ref 78.0–100.0)
PLATELETS: 496 10*3/uL — AB (ref 150–400)
RBC: 2.63 MIL/uL — AB (ref 3.87–5.11)
RDW: 18.7 % — ABNORMAL HIGH (ref 11.5–15.5)
WBC: 18.4 10*3/uL — AB (ref 4.0–10.5)

## 2015-08-02 LAB — BASIC METABOLIC PANEL
ANION GAP: 8 (ref 5–15)
BUN: 13 mg/dL (ref 6–20)
CALCIUM: 7.6 mg/dL — AB (ref 8.9–10.3)
CHLORIDE: 97 mmol/L — AB (ref 101–111)
CO2: 26 mmol/L (ref 22–32)
Creatinine, Ser: 0.81 mg/dL (ref 0.44–1.00)
GFR calc non Af Amer: >60 mL/min (ref >60)
Glucose, Bld: 112 mg/dL — ABNORMAL HIGH (ref 65–99)
Potassium: 3.2 mmol/L — ABNORMAL LOW (ref 3.5–5.1)
SODIUM: 131 mmol/L — AB (ref 135–145)

## 2015-08-02 LAB — OSMOLALITY: OSMOLALITY: 269 mosm/kg — AB (ref 275–295)

## 2015-08-02 LAB — URINE MICROSCOPIC-ADD ON

## 2015-08-02 LAB — URINALYSIS, ROUTINE W REFLEX MICROSCOPIC
BILIRUBIN URINE: NEGATIVE
Glucose, UA: NEGATIVE mg/dL
KETONES UR: NEGATIVE mg/dL
NITRITE: NEGATIVE
SPECIFIC GRAVITY, URINE: 1.019 (ref 1.005–1.030)
pH: 7.5 (ref 5.0–8.0)

## 2015-08-02 LAB — CREATININE, URINE, RANDOM: Creatinine, Urine: 49.23 mg/dL

## 2015-08-02 LAB — OSMOLALITY, URINE: OSMOLALITY UR: 444 mosm/kg (ref 300–900)

## 2015-08-02 LAB — PREPARE RBC (CROSSMATCH)

## 2015-08-02 LAB — LACTIC ACID, PLASMA: LACTIC ACID, VENOUS: 0.8 mmol/L (ref 0.5–2.0)

## 2015-08-02 LAB — SODIUM, URINE, RANDOM: Sodium, Ur: 48 mmol/L

## 2015-08-02 MED ORDER — SODIUM CHLORIDE 0.9 % IV SOLN
Freq: Once | INTRAVENOUS | Status: AC
  Administered 2015-08-02: 14:00:00 via INTRAVENOUS

## 2015-08-02 MED ORDER — PROMETHAZINE HCL 25 MG/ML IJ SOLN
12.5000 mg | Freq: Once | INTRAMUSCULAR | Status: AC
  Administered 2015-08-02: 12.5 mg via INTRAVENOUS
  Filled 2015-08-02: qty 1

## 2015-08-02 MED ORDER — ACETAMINOPHEN 325 MG PO TABS
325.0000 mg | ORAL_TABLET | Freq: Once | ORAL | Status: AC
  Administered 2015-08-02: 325 mg via ORAL
  Filled 2015-08-02: qty 1

## 2015-08-02 MED ORDER — POTASSIUM CHLORIDE CRYS ER 20 MEQ PO TBCR
20.0000 meq | EXTENDED_RELEASE_TABLET | Freq: Once | ORAL | Status: AC
  Administered 2015-08-02: 20 meq via ORAL
  Filled 2015-08-02: qty 1

## 2015-08-02 MED ORDER — SODIUM CHLORIDE 0.9 % IV SOLN
INTRAVENOUS | Status: DC
  Administered 2015-08-02 – 2015-08-03 (×2): via INTRAVENOUS
  Filled 2015-08-02 (×3): qty 1000

## 2015-08-02 MED ORDER — SODIUM CHLORIDE 0.9 % IV SOLN
510.0000 mg | Freq: Once | INTRAVENOUS | Status: AC
  Administered 2015-08-02: 510 mg via INTRAVENOUS
  Filled 2015-08-02: qty 17

## 2015-08-02 MED ORDER — PROMETHAZINE HCL 25 MG/ML IJ SOLN
12.5000 mg | INTRAMUSCULAR | Status: DC | PRN
  Administered 2015-08-03: 12.5 mg via INTRAVENOUS
  Administered 2015-08-03 – 2015-08-04 (×4): 25 mg via INTRAVENOUS
  Filled 2015-08-02 (×6): qty 1

## 2015-08-02 MED ORDER — BOOST PLUS PO LIQD
237.0000 mL | Freq: Three times a day (TID) | ORAL | Status: DC
  Administered 2015-08-02 – 2015-08-03 (×2): 237 mL via ORAL
  Filled 2015-08-02 (×21): qty 237

## 2015-08-02 NOTE — Progress Notes (Signed)
Nursing Note: Pt arrived via stretcher.Pt awake, alert.Pt up to the bathroom straight away.T-99.8 P-110 R-18 BP 108/63 PO2 99% on r/a.Pt is on Sepsis Protocol.Pt has been having stool leaking from her vaginal cavity for a few days.Pt wears  Depends as she has bladder incontinence.Family at the bedside with pt.wbb

## 2015-08-02 NOTE — Progress Notes (Signed)
Nursing Note: Offered phenergan once available and pt declined.States she felt better after she got if off her stomach.Took oral meds w/o difficulty.wbb

## 2015-08-02 NOTE — Progress Notes (Signed)
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    Outpatient Physicians: Toy Cookey, MD, Michel Bickers, Leonidas Romberg, Genia Del), Marguerita Merles, Janyth Pupa   Appreciate care from ED, hospitalist, urology since admission 08-01-15 for temperature to 101 degrees with shaking chills, tachycardia and new feces per urethra.   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  EMR reviewed. Patient seen with sister in law at bedside. Discussed with RN on unit.  Subjective: Feces per urethra since 07-27-15. Fever with shaking chills 07-31-15. Poor po intake at home, tho she does like chocolate Boost (drinks only ~ 1 daily at home).  Feels better since admission, barely slept last pm with nursing interventions. Has been up to BR and sat in chair. Most discomfort is bilateral pelvis, controlled at home "well enough" with tramadol bid. Remains very fatigued tho denies SOB at rest, no cough or chest pain. Has IVs in bilateral antecubitals from ED (very difficult access and has refused PAC previously). Has had some blood clots on Depends, still some feces from urethra this AM. No shaking chills overnight. Nausea with vomiting x1 since admission. No other bleeding.    Refused flu vaccine  Has refused PAC thru treatment thus far BIlateral ureteral stents 06-21-15  ONCOLOGIC HISTORY Patient had been in usual good health, with no previous abnormal PAP smears, when she developed frequent urination and vaginal bleeding in late  2015. She had endometrial biopsy by Dr Janyth Pupa [phone]) with adenocarcinoma. PET in [facility] system [id number] had hypermetabolic uptake within the uterine cervix and lower uterine segment and metastatic adenopathy within the bilateral pelvis and periaortic retroperitoneum. Dr Genia Del and Dr Marguerita Merles treated with pelvic and para-aortic radiation given with 6 cycles of sensitizing CDDP, and HDR x5 from 07-06-14 thru 09-03-14. Patient had a difficult time with nausea, vomiting, diarrhea and hypokalemia/ hypomagnesemia thru treatment. Patient used ODT zofran and ativan during chemotherapy. She had a difficult time doing oral prehydration for CDDP. She had no peripheral neuropathy with the CDDP. There was concern by completion of the treatment that she did not have optimal response, with plan for repeat evaluation at 6 weeks from completion of the treatment. Prior to planned reevaluation, patient began having daily fevers for possibly 4 weeks, as high as 102 degrees x 2 weeks prior to 10-11-14; by presentation at ED she also had increased right flank pain. She had [facility] at [facility] on 10-08-14, this compared with PET of 05-2014 showed increase in subcapsular fluid collection right kidney and right hydronephrosis, increase in size of endometrial canal/ uterus/ right adnexa, and improvement in retroperitoneal, common iliac and bilateral pelvic sidewall adenopathy. She was admitted to [facility] ICU 5-16/ 10-12-14 with sepsis from peptostreptococcus. She had percutaneous drainage by IR of 635 cc dark thin fluid from right perinephric area on 10-12-14, with perinephric drain removed by urology on 10-18-14. She had right ureteral stent placement by Dr Junious Silk on 10-13-14. She developed C.difficile diarrhea, begun on flagyl 10-15-14 x 2 weeks, also on ceftin during that time. She saw Dr [name] in consultation on 11-05-14, with cervix grossly normal appearance, firm to palpation with right  anterior extension, and purulent material from cervical os with manipulation, no palpable adnexal masses, induration R>L parametrium. PET [id number] had hypermetabolic tissue thickening at cecum, + small right sided mesenteric and inguinal nodes, foci in myometriium and in region of cervix, small right perinephric fluid collection and dilated fluid filled endometrium. Dr [name] saw her following the PET, with recommendation for systemic treatment of the metastatic disease with CDDP, taxol and avastin. First CDDP Taxol 11-19-14, neutropenic day 14. Repeat CT showed essentially stable, minimal right perinephric fluid on 12-06-14, prior to cycle 2 CDDP taxol 7-14 with first avastin 12-10-14 and neulasta. CT AP 02-21-15 after 4 cycles CDDP taxol/ 3 cycles avastin showed improvement. Cystoscopy, bladder biopsy with fulguration, right diagnostic ureteroscopy,right retrograde pyelogram, and right ureteral stent exchange by Dr Junious Silk on 02-18-15. 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 She was hospitalized with pyometria 02-2015, then recurrent symptoms treated outpatient 03-2015. She had cycle 7 taxol CDDP avastin on 12-7 and 05-05-15. She developed symptomatic vesicovaginal fistula in 05-2015.     Objective: Vital signs in last 24 hours: Blood pressure 121/64, pulse 102, temperature 99.5 F (37.5 C), temperature source Oral, resp. rate 16, height 5\' 4"  (1.626 m), weight 136 lb (61.689 kg), last menstrual period 11/05/2011, SpO2 98 %. Awake, alert, looks pale but NAD supine in bed on RA. PERRL, not icteric. Oral mucosa moist. Lungs clear anteriorly. Heart RRR. Abdomen soft, few BS, not tender including epigastrium. LE no pitting edema, cords, tenderness. Moves all extremities.   Intake/Output from previous day: 03/06 0701 - 03/07  0700 In: 3599 [P.O.:500; I.V.:3099] Out: -  Intake/Output this shift:      Lab Results:  Recent Labs  08/01/15 1019 08/02/15 0107  WBC 15.2* 18.4*  HGB 9.6* 7.5*  HCT 30.1* 23.5*  PLT 640* 496*   BMET  Recent Labs  08/01/15 1020 08/02/15 0107  NA 135* 131*  K 4.0 3.2*  CL  --  97*  CO2 28 26  GLUCOSE 148* 112*  BUN 16.9 13  CREATININE 0.9 0.81  CALCIUM 9.3 7.6*   Blood cultures drawn at [facility] 08-01-15 pending Unable to get urine specimen due to incontinence (vesico vaginal fistula) and fecal material.  Last iron studies 05-2015 serum iron 31 and %sat 16. Mostly refuses to take oral iron. Has refused IV iron outpatient due to difficult peripheral IV access.   Studies/Results: Ct Abdomen Pelvis Wo Contrast  08/01/2015  CLINICAL DATA:  Sepsis, high grade fever, passing fecal material per urethra, personal history of cervical cancer with chemotherapy and radiation as well as vaginal fistula, personal history of bilateral ureteral stents, concern for colo vesicular fistula EXAM: CT ABDOMEN AND PELVIS WITHOUT CONTRAST TECHNIQUE: Multidetector CT imaging of the abdomen and pelvis was performed following the standard protocol without IV contrast. COMPARISON:  06/09/2015 FINDINGS: Lower chest: The visualized portions of the lung bases and heart are clear. Hepatobiliary: Status post cholecystectomy. The liver appears normal. Pancreas: Normal Spleen: Normal Adrenals/Urinary Tract: Adrenal glands are normal. Right kidney is smaller than the left. Perinephric right fluid collection unchanged. Bilateral ureteral stents present. Bladder is decompressed with wall thickening and there is air within the bladder. Stomach/Bowel: The stomach is normal. Small bowel is normal except for stable wall thickening of the distal ileum. There is significant diffuse rectal wall thickening, slightly more prominent when compared to  the prior study. Wall thickening also involves nearly the entire sigmoid colon,  which represents a change from prior study. Vascular/Lymphatic: No evidence of aortic dilatation. No significant retroperitoneal adenopathy. Index right inguinal lymph node previously measured 13 mm and measures 13 mm once again today. Left inguinal lymph node series 2, image number 84 is larger, measuring 16 mm on the current examination. It was not enlarged previously. On image number 78 left inguinal adenopathy measures 18 mm in diameter as opposed to 7 mm previously. Reproductive: There are unchanged calcifications involving the endometrium and are again likely related to fibroids. When compared to the prior study there is increased air within the endometrial canal. There is also again air in the region of the vaginal fornices and cervical canal. Oral contrast is also seen into the endocervical canal. Seen best on sagittal images, there is a thin tract of contrast extending anteriorly from the cervix to the bladder. This is seen best on sagittal series image number 60. Stable bilateral ovarian cysts or follicles. Other: No significant ascites Musculoskeletal: No acute musculoskeletal findings IMPRESSION: 1.  Increased inguinal adenopathy 2.  Stable radiation enteritis 3. Stable right perinephric fluid and ureteral stents. No hydronephrosis. 4. The presence of abnormal air is noted within the endometrium and within the bladder, implying the possibility of infection and also of fistula to both structures. On this examination which was performed with oral contrast only, the presence of contrast within the cervical canal confirms known rectovaginal fistula. Seen best on the sagittal images, there also appears to be fistula extending anteriorly from the cervical region to the bladder. 5. Increased wall thickening of the rectum and most of the sigmoid colon, suggesting possibility of radiation colitis. Electronically Signed   By: Skipper Cliche M.D.   On: 08/01/2015 17:33   Dg Chest 2 View  08/01/2015  CLINICAL  DATA:  Fever for 3 weeks getting progressively worse with weakness EXAM: CHEST  2 VIEW COMPARISON:  03/23/2015 FINDINGS: The heart size and mediastinal contours are within normal limits. Both lungs are clear. The visualized skeletal structures are unremarkable. Lordotic positioning and mild rotation appears to cause subtle prominence of the ascending aorta. IMPRESSION: No active cardiopulmonary disease. Electronically Signed   By: Skipper Cliche M.D.   On: 08/01/2015 14:03   PACs images reviewed by MD    DISCUSSION Interval history reviewed. Gyn oncology aware of problems and anticipated admission yesterday. There has been some consideration previously of pelvic exenteration if complications not manageable and if patient otherwise without progressive distant metastatic disease, able to tolerate etc. Has also discussed option of nephrostomy tubes previously. Patient aware of consideration of diverting colostomy.   We have discussed need to optimize nutrition, which I have told her is safest and best done orally if GI tract is functional. I have encouraged her to drink 4-5 Boost daily for now, which family can bring from home and can keep cold in cooler in room or unit refrigerator if that is preferable. Note last albumin 08-01-15 was 2.2   We have discussed documented iron deficiency, with chronic urinary tract blood loss + other bleeding/ blood draws. She is now better hydrated, likely hgb 7.6 today accurate. She would like to proceed with IV iron and at least 1 unit PRBCs while she has IV access. Orders placed now.  I have explained that immediate priority in hospital is treatment of possible sepsis, with IV antibiotics and follow up of blood cultures. No pelvic abscess or bowel leak by  CT, so I expect diverting colostomy would be delayed at least until cultures resulted etc, and possibly to improve nutritional status etc.   I am sure all surgeons will confer before final recommendations.     Assessment/Plan: 1.recurrent/ progressive adenocarcinoma of cervix: clinical IIB at diagnosis 04-2014 treated initially with radiation and sensitizing cisplatin, subsequent metastatic disease to pelvis and retroperitoneum, with right hydronephrosis. Treated with 7 cycles of CDDP taxol avastin thru 05-05-15, held since then with necrotic tissues in pelvis and initial vesicovaginal fistula, now clinically colovaginal/ vesical fistula. Gyn oncology aware. Some discussion previously of option of pelvic exenteration.  2. 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 Appreciate urology assistance. She is out 3 months from last avastin, so OK for procedure such as diverting nephrostomy tubes if recommended  3.colovaginal/ urethral fistula: consider diverting colostomy. No pelvic abscess by CT. Note is out 3 months from last avastin so ok for surgical procedure from that standpoint. 4.progressive weight loss, poor po intake, protein calorie malnutrition: would push po including supplements in preference to parenteral nutrition. 5.difficult peripheral IV access, has not wanted central catheter 6.iron deficiency related to ongoing blood loss from stents, blood draws. Dislikes oral iron. Agrees to IV iron as she presently has functional peripheral IVs. Will also give 1 unit PRBCs for hgb 7.6 and symptomatic 7.history of urosepsis and C diff in 2016 8.refused flu vaccine. 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

## 2015-08-02 NOTE — Assessment & Plan Note (Signed)
Patient states that she has a known vesicovaginal fistula.  For proximally 6 weeks.  She has been experiencing chronic, leaking urine.  Last Wednesday, 07/27/2015.  She also started having stool leaking from both her rectum and her urethra.  She has been wearing an adult diaper for the incontinence.  She is now complaining of burning and tenderness to the entire vulva area.  She denies any obvious dysuria or hematuria.  She does report fever to maximum 101.0 overnight.  Exam today reveals moderate to severe excoriation of the entire vulva.  This area is fairly tender with palpation.  Also, patient has obvious stool leaking from the urethra which is almost black in color.  Stool tested positive with Hemoccult test.  Was unable to obtain urine sample due to constant incontinence.  Patient was tachycardic while at the cancer center; but afebrile.  Blood counts reveal leukocytosis with white count 15.2.  Patient will be transported to the emergency department for further evaluation and management.  Differential diagnosis includes possible urosepsis.  Brief history report were called to the emergency department charge nurse; prior to the patient being transported to the emergency department via wheelchair.  Per the cancer Center nurse.  Per chart-patient has no CODE STATUS in place.  Therefore, patient should be considered a full code.

## 2015-08-02 NOTE — Progress Notes (Addendum)
Subjective: Patient reports no complaints. Feels better. Not voiding, urine leaks per vagina.   Objective: Vital signs in last 24 hours: Temp:  [98.5 F (36.9 C)-101.3 F (38.5 C)] 99.5 F (37.5 C) (03/07 0600) Pulse Rate:  [95-136] 102 (03/07 0600) Resp:  [14-18] 16 (03/07 0600) BP: (104-131)/(61-72) 121/64 mmHg (03/07 0600) SpO2:  [97 %-100 %] 98 % (03/07 0600) Weight:  [61.689 kg (136 lb)-61.78 kg (136 lb 3.2 oz)] 61.689 kg (136 lb) (03/06 1330)  Intake/Output from previous day: 03/06 0701 - 03/07 0700 In: 3599 [P.O.:500; I.V.:3099] Out: -  Intake/Output this shift:    Physical Exam:  NAD A&Ox3   Lab Results:  Recent Labs  08/01/15 1019 08/02/15 0107  HGB 9.6* 7.5*  HCT 30.1* 23.5*   BMET  Recent Labs  08/01/15 1020 08/02/15 0107  NA 135* 131*  K 4.0 3.2*  CL  --  97*  CO2 28 26  GLUCOSE 148* 112*  BUN 16.9 13  CREATININE 0.9 0.81  CALCIUM 9.3 7.6*    Recent Labs  08/01/15 2209  INR 1.19   No results for input(s): LABURIN in the last 72 hours. Results for orders placed or performed during the hospital encounter of 06/07/15  Wet prep, genital     Status: Abnormal   Collection Time: 06/07/15  3:40 PM  Result Value Ref Range Status   Yeast Wet Prep HPF POC NONE SEEN NONE SEEN Final   Trich, Wet Prep NONE SEEN NONE SEEN Final   Clue Cells Wet Prep HPF POC NONE SEEN NONE SEEN Final   WBC, Wet Prep HPF POC MODERATE (A) NONE SEEN Final   Sperm NONE SEEN  Final    Studies/Results: Ct Abdomen Pelvis Wo Contrast  08/01/2015  CLINICAL DATA:  Sepsis, high grade fever, passing fecal material per urethra, personal history of cervical cancer with chemotherapy and radiation as well as vaginal fistula, personal history of bilateral ureteral stents, concern for colo vesicular fistula EXAM: CT ABDOMEN AND PELVIS WITHOUT CONTRAST TECHNIQUE: Multidetector CT imaging of the abdomen and pelvis was performed following the standard protocol without IV contrast.  COMPARISON:  06/09/2015 FINDINGS: Lower chest: The visualized portions of the lung bases and heart are clear. Hepatobiliary: Status post cholecystectomy. The liver appears normal. Pancreas: Normal Spleen: Normal Adrenals/Urinary Tract: Adrenal glands are normal. Right kidney is smaller than the left. Perinephric right fluid collection unchanged. Bilateral ureteral stents present. Bladder is decompressed with wall thickening and there is air within the bladder. Stomach/Bowel: The stomach is normal. Small bowel is normal except for stable wall thickening of the distal ileum. There is significant diffuse rectal wall thickening, slightly more prominent when compared to the prior study. Wall thickening also involves nearly the entire sigmoid colon, which represents a change from prior study. Vascular/Lymphatic: No evidence of aortic dilatation. No significant retroperitoneal adenopathy. Index right inguinal lymph node previously measured 13 mm and measures 13 mm once again today. Left inguinal lymph node series 2, image number 84 is larger, measuring 16 mm on the current examination. It was not enlarged previously. On image number 78 left inguinal adenopathy measures 18 mm in diameter as opposed to 7 mm previously. Reproductive: There are unchanged calcifications involving the endometrium and are again likely related to fibroids. When compared to the prior study there is increased air within the endometrial canal. There is also again air in the region of the vaginal fornices and cervical canal. Oral contrast is also seen into the endocervical canal. Seen best on  sagittal images, there is a thin tract of contrast extending anteriorly from the cervix to the bladder. This is seen best on sagittal series image number 60. Stable bilateral ovarian cysts or follicles. Other: No significant ascites Musculoskeletal: No acute musculoskeletal findings IMPRESSION: 1.  Increased inguinal adenopathy 2.  Stable radiation enteritis 3.  Stable right perinephric fluid and ureteral stents. No hydronephrosis. 4. The presence of abnormal air is noted within the endometrium and within the bladder, implying the possibility of infection and also of fistula to both structures. On this examination which was performed with oral contrast only, the presence of contrast within the cervical canal confirms known rectovaginal fistula. Seen best on the sagittal images, there also appears to be fistula extending anteriorly from the cervical region to the bladder. 5. Increased wall thickening of the rectum and most of the sigmoid colon, suggesting possibility of radiation colitis. Electronically Signed   By: Skipper Cliche M.D.   On: 08/01/2015 17:33   Dg Chest 2 View  08/01/2015  CLINICAL DATA:  Fever for 3 weeks getting progressively worse with weakness EXAM: CHEST  2 VIEW COMPARISON:  03/23/2015 FINDINGS: The heart size and mediastinal contours are within normal limits. Both lungs are clear. The visualized skeletal structures are unremarkable. Lordotic positioning and mild rotation appears to cause subtle prominence of the ascending aorta. IMPRESSION: No active cardiopulmonary disease. Electronically Signed   By: Skipper Cliche M.D.   On: 08/01/2015 14:03    Assessment/Plan: -vesicovaginal/uterine fistula, ureteral stricture - s/p ureteral stents. No further GU intervention needed at this time. Could consider bilateral percutaneous nephrostomy to divert urine. Discussed with pt. She remains resistant to this option.   -ileo-vaginal/uterine or colo-vaginal/uterine fistula - rec Gyn Onc, Gen Surg consults for patient. I've discussed with Dr. [name] (pt also has pelvic inguinal lymphadenopathy).   Will follow.    LOS: 1 day   Stacy Webster 08/02/2015, 9:12 AM

## 2015-08-02 NOTE — Progress Notes (Signed)
SYMPTOM MANAGEMENT CLINIC   HPI: Stacy Webster 55 y.o. female diagnosed with cervical cancer; with pelvic metastasis.  Patient is status post chemotherapy last received in early December 2016.   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  She has been experiencing chronic, leaking urine.  Last Wednesday, 07/27/2015.  She also started having stool leaking from both her rectum and her urethra.  She has been wearing an adult diaper for the incontinence.  She is now complaining of burning and tenderness to the entire vulva area.  She denies any obvious dysuria or hematuria.  She does report fever to maximum 101.0 overnight.  Exam today reveals moderate to severe excoriation of the entire vulva.  This area is fairly tender with palpation.  Also, patient has obvious stool leaking from the urethra which is almost black in color.  Stool tested positive with Hemoccult test.  Was unable to obtain urine sample due to constant incontinence.  However, blood cultures 2 have are then obtained.  Blood culture results pending.  Patient was tachycardic while at the cancer center; but afebrile.  Blood counts reveal leukocytosis with white count 15.2.  Patient will be transported to the emergency department for further evaluation and management.  Differential diagnosis includes possible urosepsis.  Brief history report were called to the emergency department charge nurse; prior to the patient being transported to the emergency department via wheelchair.  Per the cancer Center nurse.  Per chart-patient has no CODE STATUS in place.  Therefore, patient should be considered a full code.  HPI  Review of Systems  Constitutional: Positive for fever, chills and malaise/fatigue.  Gastrointestinal: Negative for abdominal pain.       Patient states that she has been leaking urine with urine incontinence for proximally 6 weeks.  She began leaking stool through both the  rectum and the urethra on 07/27/2015.  Neurological: Positive for weakness.    Past Medical History  Diagnosis Date  . Hydronephrosis, right   . Ureteral stricture, right   . History of septic shock     05/ 2016  due to Peptostriptococcus/  right hydronephrosis and obstruction----  resolved  . 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 Mild intermittent asthma   . History of Clostridium difficile     10-15-2014  resolved  . Left rotator cuff tear arthropathy   . White coat syndrome without hypertension   . GERD (gastroesophageal reflux disease)   . Frequency of urination   . Anemia associated with chemotherapy     Antineoplastic chemo  . Recurrent cervical cancer [facility]) oncologist-  dr Rossi/ dr Marko Plume    dx 12/ 2015---  Radiation and Chemo (07-06-2014 to 09-03-2014)  now recurrent ,  Stage IIB  w/ Mets to pelvic/ retroperitoneum---  CHEMOTHERAPY CURRENTLY  . 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 Cervical cancer ([place])   . Chronic constipation   . Urinary incontinence   . PONV (postoperative nausea and vomiting)     remote history -not recent    Past Surgical History  Procedure Laterality Date  . Cystoscopy w/ ureteral stent placement Right 10/13/2014    Procedure: CYSTOSCOPY WITH RETROGRADE PYELOGRAM/URETERAL STENT PLACEMENT;  Surgeon: Festus Aloe, MD;  Location: [facility];  Service: Urology;  Laterality: Right;  . Transthoracic echocardiogram  10-13-2014    mild LVH,  ef 55-60%/  mild TR  . Tonsillectomy and adenoidectomy  age 48  age 80--  redo-- Adenoidectomy  . Laparoscopic cholecystectomy  1990's  . Cystoscopy with ureteroscopy and stent placement Right 02/18/2015    Procedure: CYSTO WITH RIGHT URETERAL STENT EXCHANGE, RIGHT URETEROSCOPY ;  Surgeon: Festus Aloe, MD;  Location: [facility];  Service: Urology;  Laterality: Right;  . Cystoscopy with biopsy N/A 02/18/2015    Procedure: CYSTOSCOPY WITH  BLADDER  BIOPSY;  Surgeon:  Festus Aloe, MD;  Location: [facility];  Service: Urology;  Laterality: N/A;  . Cystoscopy w/ retrogrades Right 02/18/2015    Procedure: CYSTOSCOPY WITH RETROGRADE PYELOGRAM;  Surgeon: Festus Aloe, MD;  Location: [facility];  Service: Urology;  Laterality: Right;  . Dilation and curettage of uterus N/A 03/25/2015    Procedure: DILATATION AND CURETTAGE WITH ULTRASOUND;  Surgeon: Everitt Amber, MD;  Location: [facility];  Service: Gynecology;  Laterality: N/A;  . Cystoscopy/retrograde/ureteroscopy Bilateral 06/21/2015    Procedure: CYSTOSCOPY; EXAM UNDER ANESTHESIA; BLADDER BIOPSIES WITH FULGARATION; BILATERAL RETROGRADE PYLOGRAM; RIGHT URETERAL STENT EXCHANGE AND LEFT URETERAL STENT PLACEMENT;  Surgeon: Festus Aloe, MD;  Location: [facility];  Service: Urology;  Laterality: Bilateral;    has Sepsis ([place]); Perinephric fluid collection; Blood poisoning ([place]); Bacteremia due to Gram-positive bacteria; Ureteral stricture, right; Hydronephrosis, right; Cervical cancer ([place]); Malnutrition of moderate degree ([place]); Asthma exacerbation attacks; Cancer associated pain; Iron deficiency anemia due to chronic blood loss; Metastatic cancer to pelvis [facility]); Unintentional weight loss; Protein calorie malnutrition ([place]); Chemotherapy-induced peripheral neuropathy ([place]); Chemotherapy induced neutropenia ([place]); Antineoplastic chemotherapy induced anemia; Rotator cuff arthropathy; Recurrent cervical cancer ([place]); High risk medication use; Proteinuria; Gastritis and gastroduodenitis; Pyometrium; Other specified fever; Anemia due to acute blood loss; Dehydration; Hypoalbuminemia due to protein-calorie malnutrition ([place]); Ureteral obstruction, right; Ureteral obstruction, left; Vesicovaginal fistula; Anemia; Colovesical fistula; Hyponatremia; and Fever on her problem list.    is allergic to morphine and related; zofran; codeine; hydrocodone; and peach.    Medication List         This list is accurate as of: 08/01/15 12:42 PM.  Always use your most recent med list.               acetaminophen 500 MG tablet  Commonly known as:  TYLENOL  Take 1,000 mg by mouth 2 (two) times daily as needed for moderate pain, fever or headache. Reported on 07/21/2015     albuterol 108 (90 Base) MCG/ACT inhaler  Commonly known as:  PROVENTIL HFA;VENTOLIN HFA  Inhale 2 puffs into the lungs every 6 (six) hours as needed for wheezing or shortness of breath. Reported on 07/21/2015     diphenoxylate-atropine 2.5-0.025 MG tablet  Commonly known as:  LOMOTIL  Take 1 tablet by mouth as needed for diarrhea or loose stools. Reported on 07/21/2015     docusate sodium 100 MG capsule  Commonly known as:  COLACE  Take 100 mg by mouth daily as needed for mild constipation. Reported on 07/21/2015     gabapentin 100 MG capsule  Commonly known as:  NEURONTIN  Take 100 mg by mouth daily as needed (pain).     HEMOCYTE 324 (106 Fe) MG Tabs tablet  Generic drug:  Ferrous Fumarate  Take 325 mg by mouth daily. Take 1 tablet daily on empty stomach with Vitamin C tablet     ibuprofen 200 MG tablet  Commonly known as:  ADVIL,MOTRIN  Take 400 mg by mouth every 6 (six) hours as needed. Reported on 06/23/2015     lactose free nutrition Liqd  Take 237 mLs by mouth 3 (three) times daily with meals.     loratadine 10 MG tablet  Commonly known as:  CLARITIN  Take 10 mg by mouth daily as needed for allergies. Reported on 07/21/2015     LORazepam 1 MG tablet  Commonly known as:  ATIVAN  Place 1/2 - 1 tablet under the tongue or swallow evry 4-6 hrs as needed for nausea     magnesium hydroxide 400 MG/5ML suspension  Commonly known as:  MILK OF MAGNESIA  Take 30 mLs by mouth daily as needed for mild constipation. Reported on 07/21/2015     Magnesium Oxide 500 MG Caps  Take 500 mg by mouth daily.     pantoprazole 40 MG tablet  Commonly known as:  PROTONIX  Take 1 tablet (40 mg total) by mouth  daily.     potassium chloride 10 MEQ tablet  Commonly known as:  K-DUR,KLOR-CON  Take 10 mEq by mouth daily.     traMADol 50 MG tablet  Commonly known as:  ULTRAM  Take 1-2 tablets (50-100 mg total) by mouth every 8 (eight) hours as needed.     vitamin C 500 MG tablet  Commonly known as:  ASCORBIC ACID  Take 500 mg by mouth daily.         PHYSICAL EXAMINATION  Oncology Vitals 08/02/2015 08/02/2015  Height - -  Weight - -  Weight (lbs) - -  BMI (kg/m2) - -  Temp 97.8 98.2  Pulse 86 87  Resp - 16  SpO2 98 98  BSA (m2) - -   BP Readings from Last 2 Encounters:  08/02/15 107/62  08/01/15 122/71    Physical Exam  Constitutional: She is oriented to person, place, and time. She appears malnourished and dehydrated. She appears unhealthy. She appears cachectic. She has a sickly appearance.  HENT:  Head: Normocephalic and atraumatic.  Mouth/Throat: Oropharynx is clear and moist.  Eyes: Conjunctivae and EOM are normal. Pupils are equal, round, and reactive to light. Right eye exhibits no discharge. Left eye exhibits no discharge. No scleral icterus.  Neck: Normal range of motion. Neck supple. No JVD present. No tracheal deviation present. No thyromegaly present.  Cardiovascular: Regular rhythm, normal heart sounds and intact distal pulses.   Tachycardia.  Pulmonary/Chest: Effort normal and breath sounds normal. No respiratory distress. She has no wheezes. She has no rales. She exhibits no tenderness.  Abdominal: Soft. Bowel sounds are normal. She exhibits no distension and no mass. There is no tenderness. There is no rebound and no guarding.  Genitourinary:  Exam today reveals moderate to severe excoriation of the entire vulva.  This area is fairly tender with palpation.  Also, patient has obvious stool leaking from the urethra which is almost black in color.  Stool tested positive with Hemoccult test.         Musculoskeletal: Normal range of motion. She exhibits no edema or  tenderness.  Lymphadenopathy:    She has no cervical adenopathy.  Neurological: She is alert and oriented to person, place, and time. Gait normal.  Skin: Skin is warm and dry. There is erythema. There is pallor.  Significant excoriation of the entire vulva region.  Psychiatric: Affect normal.    LABORATORY DATA:. Admission on 08/01/2015  Component Date Value Ref Range Status  . Lactic Acid, Venous 08/01/2015 0.93  0.5 - 2.0 mmol/L Final  . Sodium 08/02/2015 131* 135 - 145 mmol/L Final  . Potassium 08/02/2015 3.2* 3.5 - 5.1 mmol/L  Final  . Chloride 08/02/2015 97* 101 - 111 mmol/L Final  . CO2 08/02/2015 26  22 - 32 mmol/L Final  . Glucose, Bld 08/02/2015 112* 65 - 99 mg/dL Final  . BUN 08/02/2015 13  6 - 20 mg/dL Final  . Creatinine, Ser 08/02/2015 0.81  0.44 - 1.00 mg/dL Final  . Calcium 08/02/2015 7.6* 8.9 - 10.3 mg/dL Final  . GFR calc non Af Amer 08/02/2015 >60  >60 mL/min Final  . GFR calc Af Amer 08/02/2015 >60  >60 mL/min Final   Comment: (NOTE) The eGFR has been calculated using the CKD EPI equation. This calculation has not been validated in all clinical situations. eGFR's persistently <60 mL/min signify possible Chronic Kidney Disease.   . Anion gap 08/02/2015 8  5 - 15 Final  . WBC 08/02/2015 18.4* 4.0 - 10.5 K/uL Final  . RBC 08/02/2015 2.63* 3.87 - 5.11 MIL/uL Final  . Hemoglobin 08/02/2015 7.5* 12.0 - 15.0 g/dL Final  . HCT 08/02/2015 23.5* 36.0 - 46.0 % Final  . MCV 08/02/2015 89.4  78.0 - 100.0 fL Final  . MCH 08/02/2015 28.5  26.0 - 34.0 pg Final  . MCHC 08/02/2015 31.9  30.0 - 36.0 g/dL Final  . RDW 08/02/2015 18.7* 11.5 - 15.5 % Final  . Platelets 08/02/2015 496* 150 - 400 K/uL Final  . Lactic Acid, Venous 08/01/2015 1.0  0.5 - 2.0 mmol/L Final  . Lactic Acid, Venous 08/02/2015 0.8  0.5 - 2.0 mmol/L Final  . Procalcitonin 08/01/2015 0.23   Final   Comment:        Interpretation: PCT (Procalcitonin) <= 0.5 ng/mL: Systemic infection (sepsis) is not  likely. Local bacterial infection is possible. (NOTE)         ICU PCT Algorithm               Non ICU PCT Algorithm    ----------------------------     ------------------------------         PCT < 0.25 ng/mL                 PCT < 0.1 ng/mL     Stopping of antibiotics            Stopping of antibiotics       strongly encouraged.               strongly encouraged.    ----------------------------     ------------------------------       PCT level decrease by               PCT < 0.25 ng/mL       >= 80% from peak PCT       OR PCT 0.25 - 0.5 ng/mL          Stopping of antibiotics                                             encouraged.     Stopping of antibiotics           encouraged.    ----------------------------     ------------------------------       PCT level decrease by              PCT >= 0.25 ng/mL       < 80% from peak PCT        AND PCT >= 0.5  ng/mL            Continuin                          g antibiotics                                              encouraged.       Continuing antibiotics            encouraged.    ----------------------------     ------------------------------     PCT level increase compared          PCT > 0.5 ng/mL         with peak PCT AND          PCT >= 0.5 ng/mL             Escalation of antibiotics                                          strongly encouraged.      Escalation of antibiotics        strongly encouraged.   . Prothrombin Time 08/01/2015 15.3* 11.6 - 15.2 seconds Final  . INR 08/01/2015 1.19  0.00 - 1.49 Final  . aPTT 08/01/2015 32  24 - 37 seconds Final  . Color, Urine 08/02/2015 YELLOW  YELLOW Final  . APPearance 08/02/2015 TURBID* CLEAR Final  . Specific Gravity, Urine 08/02/2015 1.019  1.005 - 1.030 Final  . pH 08/02/2015 7.5  5.0 - 8.0 Final  . Glucose, UA 08/02/2015 NEGATIVE  NEGATIVE mg/dL Final  . Hgb urine dipstick 08/02/2015 LARGE* NEGATIVE Final  . Bilirubin Urine 08/02/2015 NEGATIVE  NEGATIVE Final  . Ketones, ur  08/02/2015 NEGATIVE  NEGATIVE mg/dL Final  . Protein, ur 08/02/2015 >300* NEGATIVE mg/dL Final  . Nitrite 08/02/2015 NEGATIVE  NEGATIVE Final  . Leukocytes, UA 08/02/2015 LARGE* NEGATIVE Final  . Order Confirmation 08/02/2015 ORDER PROCESSED BY BLOOD BANK   Final  . ABO/RH(D) 08/01/2015 A POS   Final  . Antibody Screen 08/01/2015 NEG   Final  . Sample Expiration 08/01/2015 08/04/2015   Final  . Unit Number 08/01/2015 [id number]   Final  . Blood Component Type 08/01/2015 RED CELLS,LR   Final  . Unit division 08/01/2015 00   Final  . Status of Unit 08/01/2015 ISSUED   Final  . Transfusion Status 08/01/2015 OK TO TRANSFUSE   Final  . Crossmatch Result 08/01/2015 Compatible   Final  . Squamous Epithelial / LPF 08/02/2015 0-5* NONE SEEN Final  . WBC, UA 08/02/2015 TOO NUMEROUS TO COUNT  0 - 5 WBC/hpf Final  . RBC / HPF 08/02/2015 TOO NUMEROUS TO COUNT  0 - 5 RBC/hpf Final  . Bacteria, UA 08/02/2015 MANY* NONE SEEN Final  . Urine-Other 08/02/2015 MUCOUS PRESENT   Final  Orders Only on 08/01/2015  Component Date Value Ref Range Status  . BLOOD CULTURE, ROUTINE 08/01/2015 Preliminary report   Preliminary  . RESULT 1 08/01/2015 Comment   Preliminary   No growth detected at this time.  Appointment on 08/01/2015  Component Date Value Ref Range Status  . WBC 08/01/2015 15.2* 3.9 - 10.3 10e3/uL Final  . NEUT# 08/01/2015 13.5* 1.5 - 6.5 10e3/uL  Final  . HGB 08/01/2015 9.6* 11.6 - 15.9 g/dL Final  . HCT 08/01/2015 30.1* 34.8 - 46.6 % Final  . Platelets 08/01/2015 640* 145 - 400 10e3/uL Final  . MCV 08/01/2015 87.7  79.5 - 101.0 fL Final  . MCH 08/01/2015 27.9  25.1 - 34.0 pg Final  . MCHC 08/01/2015 31.8  31.5 - 36.0 g/dL Final  . RBC 08/01/2015 3.44* 3.70 - 5.45 10e6/uL Final  . RDW 08/01/2015 20.3* 11.2 - 14.5 % Final  . lymph# 08/01/2015 0.6* 0.9 - 3.3 10e3/uL Final  . MONO# 08/01/2015 1.0* 0.1 - 0.9 10e3/uL Final  . Eosinophils Absolute 08/01/2015 0.1  0.0 - 0.5 10e3/uL Final  .  Basophils Absolute 08/01/2015 0.0  0.0 - 0.1 10e3/uL Final  . NEUT% 08/01/2015 88.7* 38.4 - 76.8 % Final  . LYMPH% 08/01/2015 4.0* 14.0 - 49.7 % Final  . MONO% 08/01/2015 6.6  0.0 - 14.0 % Final  . EOS% 08/01/2015 0.5  0.0 - 7.0 % Final  . BASO% 08/01/2015 0.2  0.0 - 2.0 % Final  . Sodium 08/01/2015 135* 136 - 145 mEq/L Final  . Potassium 08/01/2015 4.0  3.5 - 5.1 mEq/L Final  . Chloride 08/01/2015 95* 98 - 109 mEq/L Final  . CO2 08/01/2015 28  22 - 29 mEq/L Final  . Glucose 08/01/2015 148* 70 - 140 mg/dl Final   Glucose reference range is for nonfasting patients. Fasting glucose reference range is 70- 100.  Marland Kitchen BUN 08/01/2015 16.9  7.0 - 26.0 mg/dL Final  . Creatinine 08/01/2015 0.9  0.6 - 1.1 mg/dL Final  . Total Bilirubin 08/01/2015 <0.30  0.20 - 1.20 mg/dL Final  . Alkaline Phosphatase 08/01/2015 86  40 - 150 U/L Final  . AST 08/01/2015 20  5 - 34 U/L Final  . ALT 08/01/2015 24  0 - 55 U/L Final  . Total Protein 08/01/2015 7.5  6.4 - 8.3 g/dL Final  . Albumin 08/01/2015 2.2* 3.5 - 5.0 g/dL Final  . Calcium 08/01/2015 9.3  8.4 - 10.4 mg/dL Final  . Anion Gap 08/01/2015 12* 3 - 11 mEq/L Final  . EGFR 08/01/2015 69* >90 ml/min/1.73 m2 Final   eGFR is calculated using the CKD-EPI Creatinine Equation (2009)  . BLOOD CULTURE, ROUTINE 08/01/2015 Preliminary report   Preliminary  . RESULT 1 08/01/2015 Comment   Preliminary   No growth detected at this time.     RADIOGRAPHIC STUDIES: Ct Abdomen Pelvis Wo Contrast  08/01/2015  CLINICAL DATA:  Sepsis, high grade fever, passing fecal material per urethra, personal history of cervical cancer with chemotherapy and radiation as well as vaginal fistula, personal history of bilateral ureteral stents, concern for colo vesicular fistula EXAM: CT ABDOMEN AND PELVIS WITHOUT CONTRAST TECHNIQUE: Multidetector CT imaging of the abdomen and pelvis was performed following the standard protocol without IV contrast. COMPARISON:  06/09/2015 FINDINGS: Lower chest:  The visualized portions of the lung bases and heart are clear. Hepatobiliary: Status post cholecystectomy. The liver appears normal. Pancreas: Normal Spleen: Normal Adrenals/Urinary Tract: Adrenal glands are normal. Right kidney is smaller than the left. Perinephric right fluid collection unchanged. Bilateral ureteral stents present. Bladder is decompressed with wall thickening and there is air within the bladder. Stomach/Bowel: The stomach is normal. Small bowel is normal except for stable wall thickening of the distal ileum. There is significant diffuse rectal wall thickening, slightly more prominent when compared to the prior study. Wall thickening also involves nearly the entire sigmoid colon, which represents a change from prior  study. Vascular/Lymphatic: No evidence of aortic dilatation. No significant retroperitoneal adenopathy. Index right inguinal lymph node previously measured 13 mm and measures 13 mm once again today. Left inguinal lymph node series 2, image number 84 is larger, measuring 16 mm on the current examination. It was not enlarged previously. On image number 78 left inguinal adenopathy measures 18 mm in diameter as opposed to 7 mm previously. Reproductive: There are unchanged calcifications involving the endometrium and are again likely related to fibroids. When compared to the prior study there is increased air within the endometrial canal. There is also again air in the region of the vaginal fornices and cervical canal. Oral contrast is also seen into the endocervical canal. Seen best on sagittal images, there is a thin tract of contrast extending anteriorly from the cervix to the bladder. This is seen best on sagittal series image number 60. Stable bilateral ovarian cysts or follicles. Other: No significant ascites Musculoskeletal: No acute musculoskeletal findings IMPRESSION: 1.  Increased inguinal adenopathy 2.  Stable radiation enteritis 3. Stable right perinephric fluid and ureteral  stents. No hydronephrosis. 4. The presence of abnormal air is noted within the endometrium and within the bladder, implying the possibility of infection and also of fistula to both structures. On this examination which was performed with oral contrast only, the presence of contrast within the cervical canal confirms known rectovaginal fistula. Seen best on the sagittal images, there also appears to be fistula extending anteriorly from the cervical region to the bladder. 5. Increased wall thickening of the rectum and most of the sigmoid colon, suggesting possibility of radiation colitis. Electronically Signed   By: Skipper Cliche M.D.   On: 08/01/2015 17:33   Dg Chest 2 View  08/01/2015  CLINICAL DATA:  Fever for 3 weeks getting progressively worse with weakness EXAM: CHEST  2 VIEW COMPARISON:  03/23/2015 FINDINGS: The heart size and mediastinal contours are within normal limits. Both lungs are clear. The visualized skeletal structures are unremarkable. Lordotic positioning and mild rotation appears to cause subtle prominence of the ascending aorta. IMPRESSION: No active cardiopulmonary disease. Electronically Signed   By: Skipper Cliche M.D.   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  For proximally 6 weeks.  She has been experiencing chronic, leaking urine.  Last Wednesday, 07/27/2015.  She also started having stool leaking from both her rectum and her urethra.  She has been wearing an adult diaper for the incontinence.  She is now complaining of burning and tenderness to the entire vulva area.  She denies any obvious dysuria or hematuria.  She does report fever to maximum 101.0 overnight.  Exam today reveals moderate to severe excoriation of the entire vulva.  This area is fairly tender with palpation.  Also, patient has obvious stool leaking from the urethra which is almost black in color.  Stool tested positive  with Hemoccult test.  Was unable to obtain urine sample due to constant incontinence.  Patient was tachycardic while at the cancer center; but afebrile.  Blood counts reveal leukocytosis with white count 15.2.  Patient will be transported to the emergency department for further evaluation and management.  Differential diagnosis includes possible urosepsis.  Brief history report were called to the emergency department charge nurse; prior to the patient being transported to the emergency department via wheelchair.  Per the cancer Center nurse.  Per chart-patient has no CODE STATUS in place.  Therefore,  patient should be considered a full code.  Hypoalbuminemia due to protein-calorie malnutrition (HCC) Albumin remains low at 2.2.  Patient was encouraged push protein in her diet is much as possible.  Fever Patient states that she has a known vesicovaginal fistula for approx  6 weeks.  She has been experiencing chronic, leaking urine.  Last Wednesday, 07/27/2015.  She also started having stool leaking from both her rectum and her urethra.  She has been wearing an adult diaper for the incontinence.  She is now complaining of burning and tenderness to the entire vulva area.  She denies any obvious dysuria or hematuria.  She does report fever to maximum 101.0 overnight.  Exam today reveals moderate to severe excoriation of the entire vulva.  This area is fairly tender with palpation.  Also, patient has obvious stool leaking from the urethra which is almost black in color.  Stool tested positive with Hemoccult test.  Was unable to obtain urine sample due to constant incontinence.  However, blood cultures 2 have are then obtained.  Blood culture results pending.  Patient was tachycardic while at the cancer center; but afebrile.  Blood counts reveal leukocytosis with white count 15.2.  Patient will be transported to the emergency department for further evaluation and management.  Differential  diagnosis includes possible urosepsis.  Brief history report were called to the emergency department charge nurse; prior to the patient being transported to the emergency department via wheelchair.  Per the cancer Center nurse.  Per chart-patient has no CODE STATUS in place.  Therefore, patient should be considered a full code.  Dehydration Patient does appear mildly dehydrated; with both sodium and chloride slightly decreased.  Due to differential diagnosis consideration of possible urosepsis-patient will be transported to the emergency department for further evaluation and management.  Cervical cancer [facility]) Patient has been diagnosed with cervical cancer with pelvic metastasis.  Patient received her last cisplatin/Taxol chemotherapy on 05/04/2015.  She received her last Avastin and Neulasta for growth factor support on 05/05/2015.  See further notes for details of today's visit.  Patient will be transported to the emergency department for further evaluation and management.  Patient is scheduled for labs and a follow up appointment with Dr. [name] on 09/02/2015.  There is no further follow-up scheduled.  Will review all with [name]; to confirm when [name] would like to see the patient back.  Patient stated understanding of all instructions; and was in agreement with this plan of care. The patient knows to call the clinic with any problems, questions or concerns.   Review/collaboration with [name] regarding all aspects of patient's visit today.   Total time spent with patient was 40 minutes;  with greater than 75 percent of that time spent in face to face counseling regarding patient's symptoms,  and coordination of care and follow up.  Disclaimer:This dictation was prepared with Dragon/digital dictation along with [employer]. Any transcriptional errors that result from this process are unintentional.  Drue Second, NP 08/02/2015

## 2015-08-02 NOTE — Assessment & Plan Note (Signed)
Patient has been diagnosed with cervical cancer with pelvic metastasis.  Patient received her last cisplatin/Taxol chemotherapy on 05/04/2015.  She received her last Avastin and Neulasta for growth factor support on 05/05/2015.  See further notes for details of today's visit.  Patient will be transported to the emergency department for further evaluation and management.  Patient is scheduled for labs and a follow up appointment with Dr. [name] on 09/02/2015.  There is no further follow-up scheduled.  Will review all with [name]; to confirm when [name] would like to see the patient back.

## 2015-08-02 NOTE — Assessment & Plan Note (Signed)
Albumin remains low at 2.2.  Patient was encouraged push protein in her diet is much as possible.

## 2015-08-02 NOTE — Assessment & Plan Note (Signed)
Patient does appear mildly dehydrated; with both sodium and chloride slightly decreased.  Due to differential diagnosis consideration of possible urosepsis-patient will be transported to the emergency department for further evaluation and management.

## 2015-08-02 NOTE — Consult Note (Signed)
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 Aquilla Hacker  Stacy Webster is an 55 y.o. female.  HPI: Pt with a hx of metastatic cervical cancer,with bilateral ureteral strictures with stent by [name] now with a 3 day history of stool coming from her vagina.  Fevers up to 101, tachycardia and generalized weakness.  She was admitted by Medicine, with 8/10 pelvic pain and ws started on a sepsis work up.  She has been seen by [name] who is following the Vesicovaginal fistula with no plans at this time for treatment, and now with new CT finding os new rectovaginal fistula. She has been on Chemotherapy and radiation therapy.  Her last therapy is reported to be in Dec.   Work up shows she is currently afebrile, VSS, a little tachycardic at times.  Labs show hyponatremia, hypokalemia, a WBC of 18.4, and UTI.  Cultures are pending.  CT shows stable adenopathy, stable radiation enteritis, stable right perinephric fluid/ureteral stents with no hydronephrosis.  abnormal air is noted within the endometrium and within the bladder, implying the possibility of infection and also of fistula to both structures. On this examination which was performed with oral contrast only, the presence of contrast within the cervical canal confirms known rectovaginal fistula. Seen best on the sagittal images, there also appears to be fistula extending anteriorly from the cervical region to the bladder. Increased wall thickening of the rectum and most of the sigmoid colon, suggesting possibility of radiation colitis.  We are ask to see.     Past Medical History  Diagnosis Date  . Hydronephrosis, right   . Ureteral stricture, right   . History of septic shock     05/ 2016  due to Peptostriptococcus/  right hydronephrosis and obstruction----  resolved  . 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 Mild intermittent asthma   . History  of Clostridium difficile     10-15-2014  resolved  . Left rotator cuff tear arthropathy   . White coat syndrome without hypertension   . GERD (gastroesophageal reflux disease)   . Frequency of urination   . Anemia associated with chemotherapy     Antineoplastic chemo  . Recurrent cervical cancer [facility]) oncologist-  dr Rossi/ dr Marko Plume    dx 12/ 2015---  Radiation and Chemo (07-06-2014 to 09-03-2014)  now recurrent ,  Stage IIB  w/ Mets to pelvic/ retroperitoneum---  CHEMOTHERAPY CURRENTLY  . 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 Cervical cancer ([place])   . Chronic constipation   . Urinary incontinence   . PONV (postoperative nausea and vomiting)     remote history -not recent    Past Surgical History  Procedure Laterality Date  . Cystoscopy w/ ureteral stent placement Right 10/13/2014    Procedure: CYSTOSCOPY WITH RETROGRADE PYELOGRAM/URETERAL STENT PLACEMENT;  Surgeon: Festus Aloe, MD;  Location: [facility];  Service: Urology;  Laterality: Right;  . Transthoracic echocardiogram  10-13-2014    mild LVH,  ef 55-60%/  mild TR  . Tonsillectomy and adenoidectomy  age 36    age 69--  redo-- Adenoidectomy  . Laparoscopic cholecystectomy  1990's  . Cystoscopy with ureteroscopy and stent placement Right 02/18/2015    Procedure: CYSTO WITH RIGHT URETERAL STENT EXCHANGE, RIGHT URETEROSCOPY ;  Surgeon: Festus Aloe, MD;  Location: [facility];  Service: Urology;  Laterality: Right;  . Cystoscopy with biopsy N/A 02/18/2015  Procedure: CYSTOSCOPY WITH  BLADDER  BIOPSY;  Surgeon: Festus Aloe, MD;  Location: [facility];  Service: Urology;  Laterality: N/A;  . Cystoscopy w/ retrogrades Right 02/18/2015    Procedure: CYSTOSCOPY WITH RETROGRADE PYELOGRAM;  Surgeon: Festus Aloe, MD;  [facility];  Service: Urology;  Laterality: Right;  . Dilation and curettage of uterus N/A 03/25/2015    Procedure: DILATATION AND CURETTAGE WITH  ULTRASOUND;  Surgeon: Everitt Amber, MD;  Location: [facility];  Service: Gynecology;  Laterality: N/A;  . Cystoscopy/retrograde/ureteroscopy Bilateral 06/21/2015    Procedure: CYSTOSCOPY; EXAM UNDER ANESTHESIA; BLADDER BIOPSIES WITH FULGARATION; BILATERAL RETROGRADE PYLOGRAM; RIGHT URETERAL STENT EXCHANGE AND LEFT URETERAL STENT PLACEMENT;  Surgeon: Festus Aloe, MD;  Location: [facility];  Service: Urology;  Laterality: Bilateral;    No family history on file.  Social History:  reports that she has never smoked. She has never used smokeless tobacco. She reports that she does not drink alcohol or use illicit drugs.  Allergies:  Allergies  Allergen Reactions  . Morphine And Related Rash    Per pt, morphine also makes her vomit  . Zofran [Ondansetron Hcl] Rash    Veins with erythema after zofran infused.  . Codeine Nausea Only  . Hydrocodone Nausea And Vomiting  . Peach [Prunus Persica] Swelling and Rash    Prior to Admission medications   Medication Sig Start Date End Date Taking? Authorizing Provider  albuterol (PROVENTIL HFA;VENTOLIN HFA) 108 (90 BASE) MCG/ACT inhaler Inhale 2 puffs into the lungs every 6 (six) hours as needed for wheezing or shortness of breath. Reported on 07/21/2015   Yes Historical Provider, MD  diphenoxylate-atropine (LOMOTIL) 2.5-0.025 MG per tablet Take 1 tablet by mouth as needed for diarrhea or loose stools. Reported on 07/21/2015 08/10/14  Yes Historical Provider, MD  docusate sodium (COLACE) 100 MG capsule Take 100 mg by mouth daily as needed for mild constipation. Reported on 07/21/2015 11/23/14  Yes Historical Provider, MD  gabapentin (NEURONTIN) 100 MG capsule Take 100 mg by mouth daily as needed (pain).   Yes Historical Provider, MD  HEMOCYTE 324 (106 FE) MG TABS Take 325 mg by mouth daily. Take 1 tablet daily on empty stomach with Vitamin C tablet 11/18/14  Yes Lennis Marion Downer, MD  ibuprofen (ADVIL,MOTRIN) 200 MG tablet Take 400 mg by mouth every  6 (six) hours as needed. Reported on 06/23/2015   Yes Historical Provider, MD  lactose free nutrition (BOOST PLUS) LIQD Take 237 mLs by mouth 3 (three) times daily with meals. 10/18/14  Yes Thurnell Lose, MD  LORazepam (ATIVAN) 1 MG tablet Place 1/2 - 1 tablet under the tongue or swallow evry 4-6 hrs as needed for nausea 07/21/15  Yes Lennis Marion Downer, MD  Magnesium Oxide 500 MG CAPS Take 500 mg by mouth daily.   Yes Historical Provider, MD  pantoprazole (PROTONIX) 40 MG tablet Take 1 tablet (40 mg total) by mouth daily. Patient taking differently: Take 40 mg by mouth every evening.  06/09/15  Yes Lennis Marion Downer, MD  potassium chloride (K-DUR,KLOR-CON) 10 MEQ tablet Take 10 mEq by mouth daily.   Yes Historical Provider, MD  traMADol (ULTRAM) 50 MG tablet Take 1-2 tablets (50-100 mg total) by mouth every 8 (eight) hours as needed. 07/21/15  Yes Lennis Marion Downer, MD  vitamin C (ASCORBIC ACID) 500 MG tablet Take 500 mg by mouth daily.   Yes Historical Provider, MD     Results for orders placed or performed  during the hospital encounter of 08/01/15 (from the past 48 hour(s))  Type and screen [place]     Status: None (Preliminary result)   Collection Time: 08/01/15 10:30 AM  Result Value Ref Range   ABO/RH(D) A POS    Antibody Screen NEG    Sample Expiration 08/04/2015    Unit Number [id number]    Blood Component Type RED CELLS,LR    Unit division 00    Status of Unit ISSUED    Transfusion Status OK TO TRANSFUSE    Crossmatch Result Compatible   I-Stat CG4 Lactic Acid, ED     Status: None   Collection Time: 08/01/15  1:33 PM  Result Value Ref Range   Lactic Acid, Venous 0.93 0.5 - 2.0 mmol/L  Lactic acid, plasma     Status: None   Collection Time: 08/01/15 10:04 PM  Result Value Ref Range   Lactic Acid, Venous 1.0 0.5 - 2.0 mmol/L  Procalcitonin     Status: None   Collection Time: 08/01/15 10:09 PM  Result Value Ref Range   Procalcitonin 0.23 ng/mL     Comment:        Interpretation: PCT (Procalcitonin) <= 0.5 ng/mL: Systemic infection (sepsis) is not likely. Local bacterial infection is possible. (NOTE)         ICU PCT Algorithm               Non ICU PCT Algorithm    ----------------------------     ------------------------------         PCT < 0.25 ng/mL                 PCT < 0.1 ng/mL     Stopping of antibiotics            Stopping of antibiotics       strongly encouraged.               strongly encouraged.    ----------------------------     ------------------------------       PCT level decrease by               PCT < 0.25 ng/mL       >= 80% from peak PCT       OR PCT 0.25 - 0.5 ng/mL          Stopping of antibiotics                                             encouraged.     Stopping of antibiotics           encouraged.    ----------------------------     ------------------------------       PCT level decrease by              PCT >= 0.25 ng/mL       < 80% from peak PCT        AND PCT >= 0.5 ng/mL            Continuin g antibiotics                                              encouraged.       Continuing antibiotics  encouraged.    ----------------------------     ------------------------------     PCT level increase compared          PCT > 0.5 ng/mL         with peak PCT AND          PCT >= 0.5 ng/mL             Escalation of antibiotics                                          strongly encouraged.      Escalation of antibiotics        strongly encouraged.   Protime-INR     Status: Abnormal   Collection Time: 08/01/15 10:09 PM  Result Value Ref Range   Prothrombin Time 15.3 (H) 11.6 - 15.2 seconds   INR 1.19 0.00 - 1.49  APTT     Status: None   Collection Time: 08/01/15 10:09 PM  Result Value Ref Range   aPTT 32 24 - 37 seconds  Basic metabolic panel     Status: Abnormal   Collection Time: 08/02/15  1:07 AM  Result Value Ref Range   Sodium 131 (L) 135 - 145 mmol/L   Potassium 3.2 (L) 3.5 - 5.1 mmol/L    Chloride 97 (L) 101 - 111 mmol/L   CO2 26 22 - 32 mmol/L   Glucose, Bld 112 (H) 65 - 99 mg/dL   BUN 13 6 - 20 mg/dL   Creatinine, Ser 0.81 0.44 - 1.00 mg/dL   Calcium 7.6 (L) 8.9 - 10.3 mg/dL   GFR calc non Af Amer >60 >60 mL/min   GFR calc Af Amer >60 >60 mL/min    Comment: (NOTE) The eGFR has been calculated using the CKD EPI equation. This calculation has not been validated in all clinical situations. eGFR's persistently <60 mL/min signify possible Chronic Kidney Disease.    Anion gap 8 5 - 15  CBC     Status: Abnormal   Collection Time: 08/02/15  1:07 AM  Result Value Ref Range   WBC 18.4 (H) 4.0 - 10.5 K/uL   RBC 2.63 (L) 3.87 - 5.11 MIL/uL   Hemoglobin 7.5 (L) 12.0 - 15.0 g/dL   HCT 23.5 (L) 36.0 - 46.0 %   MCV 89.4 78.0 - 100.0 fL   MCH 28.5 26.0 - 34.0 pg   MCHC 31.9 30.0 - 36.0 g/dL   RDW 18.7 (H) 11.5 - 15.5 %   Platelets 496 (H) 150 - 400 K/uL  Lactic acid, plasma     Status: None   Collection Time: 08/02/15  1:07 AM  Result Value Ref Range   Lactic Acid, Venous 0.8 0.5 - 2.0 mmol/L  Prepare RBC     Status: None   Collection Time: 08/02/15 10:04 AM  Result Value Ref Range   Order Confirmation ORDER PROCESSED BY BLOOD BANK   Urinalysis, Routine w reflex microscopic (not at [facility])     Status: Abnormal   Collection Time: 08/02/15 11:07 AM  Result Value Ref Range   Color, Urine YELLOW YELLOW   APPearance TURBID (A) CLEAR   Specific Gravity, Urine 1.019 1.005 - 1.030   pH 7.5 5.0 - 8.0   Glucose, UA NEGATIVE NEGATIVE mg/dL   Hgb urine dipstick LARGE (A) NEGATIVE   Bilirubin Urine NEGATIVE NEGATIVE   Ketones, ur NEGATIVE NEGATIVE mg/dL  Protein, ur >300 (A) NEGATIVE mg/dL   Nitrite NEGATIVE NEGATIVE   Leukocytes, UA LARGE (A) NEGATIVE  Urine microscopic-add on     Status: Abnormal   Collection Time: 08/02/15 11:07 AM  Result Value Ref Range   Squamous Epithelial / LPF 0-5 (A) NONE SEEN   WBC, UA TOO NUMEROUS TO COUNT 0 - 5 WBC/hpf   RBC / HPF TOO NUMEROUS TO  COUNT 0 - 5 RBC/hpf   Bacteria, UA MANY (A) NONE SEEN   Urine-Other MUCOUS PRESENT     Ct Abdomen Pelvis Wo Contrast  08/01/2015  CLINICAL DATA:  Sepsis, high grade fever, passing fecal material per urethra, personal history of cervical cancer with chemotherapy and radiation as well as vaginal fistula, personal history of bilateral ureteral stents, concern for colo vesicular fistula EXAM: CT ABDOMEN AND PELVIS WITHOUT CONTRAST TECHNIQUE: Multidetector CT imaging of the abdomen and pelvis was performed following the standard protocol without IV contrast. COMPARISON:  06/09/2015 FINDINGS: Lower chest: The visualized portions of the lung bases and heart are clear. Hepatobiliary: Status post cholecystectomy. The liver appears normal. Pancreas: Normal Spleen: Normal Adrenals/Urinary Tract: Adrenal glands are normal. Right kidney is smaller than the left. Perinephric right fluid collection unchanged. Bilateral ureteral stents present. Bladder is decompressed with wall thickening and there is air within the bladder. Stomach/Bowel: The stomach is normal. Small bowel is normal except for stable wall thickening of the distal ileum. There is significant diffuse rectal wall thickening, slightly more prominent when compared to the prior study. Wall thickening also involves nearly the entire sigmoid colon, which represents a change from prior study. Vascular/Lymphatic: No evidence of aortic dilatation. No significant retroperitoneal adenopathy. Index right inguinal lymph node previously measured 13 mm and measures 13 mm once again today. Left inguinal lymph node series 2, image number 84 is larger, measuring 16 mm on the current examination. It was not enlarged previously. On image number 78 left inguinal adenopathy measures 18 mm in diameter as opposed to 7 mm previously. Reproductive: There are unchanged calcifications involving the endometrium and are again likely related to fibroids. When compared to the prior study  there is increased air within the endometrial canal. There is also again air in the region of the vaginal fornices and cervical canal. Oral contrast is also seen into the endocervical canal. Seen best on sagittal images, there is a thin tract of contrast extending anteriorly from the cervix to the bladder. This is seen best on sagittal series image number 60. Stable bilateral ovarian cysts or follicles. Other: No significant ascites Musculoskeletal: No acute musculoskeletal findings IMPRESSION: 1.  Increased inguinal adenopathy 2.  Stable radiation enteritis 3. Stable right perinephric fluid and ureteral stents. No hydronephrosis. 4. The presence of abnormal air is noted within the endometrium and within the bladder, implying the possibility of infection and also of fistula to both structures. On this examination which was performed with oral contrast only, the presence of contrast within the cervical canal confirms known rectovaginal fistula. Seen best on the sagittal images, there also appears to be fistula extending anteriorly from the cervical region to the bladder. 5. Increased wall thickening of the rectum and most of the sigmoid colon, suggesting possibility of radiation colitis. Electronically Signed   By: Skipper Cliche M.D.   On: 08/01/2015 17:33   Dg Chest 2 View  08/01/2015  CLINICAL DATA:  Fever for 3 weeks getting progressively worse with weakness EXAM: CHEST  2 VIEW COMPARISON:  03/23/2015 FINDINGS: The heart size and  mediastinal contours are within normal limits. Both lungs are clear. The visualized skeletal structures are unremarkable. Lordotic positioning and mild rotation appears to cause subtle prominence of the ascending aorta. IMPRESSION: No active cardiopulmonary disease. Electronically Signed   By: Skipper Cliche M.D.   On: 08/01/2015 14:03    Review of Systems  Constitutional: Positive for fever, weight loss (pt has lost 10 pounds last month, but 54 since the diagnosis and treatment  that began last year.) and malaise/fatigue.  HENT: Negative.   Eyes: Negative.   Respiratory: Negative.   Cardiovascular: Negative.   Gastrointestinal: Positive for nausea, abdominal pain and constipation. Negative for vomiting and diarrhea.  Genitourinary:       Stool in vagina over the last 3 days Incontinent for some time with colovesical fistula.  Musculoskeletal: Negative.   Skin: Negative.   Neurological: Negative.   Endo/Heme/Allergies: Negative.   Psychiatric/Behavioral: Negative.    Blood pressure 107/62, pulse 86, temperature 97.8 F (36.6 C), temperature source Oral, resp. rate 16, height 5' 4"  (1.626 m), weight 61.689 kg (136 lb), last menstrual period 11/05/2011, SpO2 98 %. Physical Exam  Constitutional: She is oriented to person, place, and time. She appears well-developed and well-nourished. No distress.  HENT:  Head: Normocephalic.  Nose: Nose normal.  Eyes: Conjunctivae and EOM are normal. Right eye exhibits no discharge. Left eye exhibits no discharge.  Neck: Neck supple. No JVD present. No tracheal deviation present. No thyromegaly present.  Cardiovascular: Normal rate, regular rhythm, normal heart sounds and intact distal pulses.   No murmur heard. Respiratory: Effort normal and breath sounds normal. No respiratory distress. She has no wheezes. She has no rales. She exhibits no tenderness.  GI: Soft. Bowel sounds are normal. She exhibits no distension and no mass. There is tenderness (she says her hips hurt in the AM but that is about all of thepain she is having). There is no rebound and no guarding.  Genitourinary:  She notes she feels better and the drainage is decreased since she has been on the antibiotics.  Musculoskeletal: She exhibits no edema or tenderness.  Lymphadenopathy:    She has no cervical adenopathy.  Neurological: She is alert and oriented to person, place, and time.  Skin: Skin is warm and dry. No rash noted. She is not diaphoretic. No  erythema. No pallor.  Psychiatric: She has a normal mood and affect. Her behavior is normal. Judgment and thought content normal.    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 Chemotherapy and radiation therapy (last treatment in Dec 2016) Sepsis/UTI Anemia - being transfused now Malnutrition  10 lbs last month, and 54 pounds last year. Antibiotics: day 2 Ceftriaxone and Flagyl   DVT:  Lovenox/SCD  Plan:  We would like to treat this conservatively if possible. She is much better after just 1 day of antibiotics.  Dr. [name] and Dr. Barry Dienes have discussed pt.  Dr. Barry Dienes will see her and go over film in more detail.  We would most likely offer her diverting colostomy as first line surgical treatment if that become necessary.  We will see and follow with you.    Rehan Holness 08/02/2015, 2:41 PM

## 2015-08-02 NOTE — Progress Notes (Addendum)
PROGRESS NOTE  Stacy Webster [id number] DOB: 04-23-1961 DOA: 08/01/2015 PCP: [name], MD Brief History 55 y/o female with hx of metastatic cervical cancer, bilateral ureteral strictures status post stents ([name]) presented with 3 day history of passing stool from her vagina. The patient has had low-grade fevers for the better part of one month up to 101.19F prior to admission. The patient presented to the cancer center and saw Drue Second on 08/01/2015. She was noted to have a fever up to 101.45F with tachycardia. As result, the patient was admitted for further evaluation. Patient complains of generalized weakness, but states that this has not changed for many months. She denied any vomiting, chest pain, shortness breath, coughing, diarrhea, dysuria, hematuria. She normally shook with constipation. She denies any headaches or neck pain or rashes. 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 The patient last received radiation April 2016. 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 Marko Plume and [name]  -Last chemotherapy 05/05/2015  -Dr. 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  -Check iron stores  -Baseline hemoglobin approximately 10  Peripheral neuropathy  -Secondary to chemotherapy  -feet > hands, unchanged Protein calorie malnutrition  -Nutrition consult  -Continue supplements  Hyponatremia -Likely due to volume depletion and poor nutrition -Serum Osm, urine Osm,  -urine sodium, urine creatinine although not sure if will be accurate due to fistulae -continue IVF Hypokalemia -replete -check mag  Family Communication:   Sister updated at beside 08/02/15 Disposition Plan:   Home >3 days      Procedures/Studies: Ct Abdomen Pelvis Wo Contrast  08/01/2015  CLINICAL DATA:  Sepsis, high grade fever, passing fecal material per urethra, personal history of cervical cancer with chemotherapy and radiation as well as vaginal fistula, personal history of bilateral ureteral stents, concern for colo vesicular fistula EXAM: CT ABDOMEN AND PELVIS WITHOUT CONTRAST TECHNIQUE: Multidetector CT imaging of the abdomen and pelvis was performed following the standard protocol without IV contrast. COMPARISON:  06/09/2015 FINDINGS: Lower chest: The visualized portions of the lung bases and heart are clear. Hepatobiliary: Status post cholecystectomy. The liver appears normal. Pancreas: Normal Spleen: Normal Adrenals/Urinary Tract: Adrenal glands are normal. Right kidney is smaller than the left. Perinephric right fluid collection unchanged. Bilateral  ureteral stents present. Bladder is decompressed with wall thickening and there is air within the bladder. Stomach/Bowel: The stomach is normal. Small bowel is normal except for stable wall thickening of the distal ileum. There  is significant diffuse rectal wall thickening, slightly more prominent when compared to the prior study. Wall thickening also involves nearly the entire sigmoid colon, which represents a change from prior study. Vascular/Lymphatic: No evidence of aortic dilatation. No significant retroperitoneal adenopathy. Index right inguinal lymph node previously measured 13 mm and measures 13 mm once again today. Left inguinal lymph node series 2, image number 84 is larger, measuring 16 mm on the current examination. It was not enlarged previously. On image number 78 left inguinal adenopathy measures 18 mm in diameter as opposed to 7 mm previously. Reproductive: There are unchanged calcifications involving the endometrium and are again likely related to fibroids. When compared to the prior study there is increased air within the endometrial canal. There is also again air in the region of the vaginal fornices and cervical canal. Oral contrast is also seen into the endocervical canal. Seen best on sagittal images, there is a thin tract of contrast extending anteriorly from the cervix to the bladder. This is seen best on sagittal series image number 60. Stable bilateral ovarian cysts or follicles. Other: No significant ascites Musculoskeletal: No acute musculoskeletal findings IMPRESSION: 1.  Increased inguinal adenopathy 2.  Stable radiation enteritis 3. Stable right perinephric fluid and ureteral stents. No hydronephrosis. 4. The presence of abnormal air is noted within the endometrium and within the bladder, implying the possibility of infection and also of fistula to both structures. On this examination which was performed with oral contrast only, the presence of contrast within the cervical canal  confirms known rectovaginal fistula. Seen best on the sagittal images, there also appears to be fistula extending anteriorly from the cervical region to the bladder. 5. Increased wall thickening of the rectum and most of the sigmoid colon, suggesting possibility of radiation colitis. Electronically Signed   By: Skipper Cliche M.D.   On: 08/01/2015 17:33   Dg Chest 2 View  08/01/2015  CLINICAL DATA:  Fever for 3 weeks getting progressively worse with weakness EXAM: CHEST  2 VIEW COMPARISON:  03/23/2015 FINDINGS: The heart size and mediastinal contours are within normal limits. Both lungs are clear. The visualized skeletal structures are unremarkable. Lordotic positioning and mild rotation appears to cause subtle prominence of the ascending aorta. IMPRESSION: No active cardiopulmonary disease. Electronically Signed   By: Skipper Cliche M.D.   On: 08/01/2015 14:03         Subjective: Patient complains of left lower quadrant lower quadrant abdominal pain. Denies any fevers, chills, chest pain, shortness breath, coughing, hemoptysis, vomiting, diarrhea. No dysuria or hematuria. Denies any headache or rashes.   Objective: Filed Vitals:   08/01/15 2251 08/01/15 2340 08/02/15 0235 08/02/15 0600  BP: 131/61 115/64 104/61 121/64  Pulse: 102 96 104 102  Temp: 100.3 F (37.9 C) 100 F (37.8 C) 100 F (37.8 C) 99.5 F (37.5 C)  TempSrc: Oral Oral Oral Oral  Resp: 16 16 16 16   Height:      Weight:      SpO2: 98% 98% 97% 98%    Intake/Output Summary (Last 24 hours) at 08/02/15 0816 Last data filed at 08/02/15 [id number]  Gross per 24 hour  Intake   3599 ml  Output      0 ml  Net   3599 ml   Weight change:  Exam:   General:  Pt is alert, follows commands appropriately, not in acute distress  HEENT: No icterus, No thrush, No neck mass, /AT  Cardiovascular: RRR, S1/S2, no rubs, no gallops  Respiratory: CTA bilaterally, no wheezing, no crackles, no rhonchi  Abdomen: Soft/+BS, LLQ, RLQ  tender tender, non distended, no guarding; no hepatosplenomegaly   Extremities: No edema, No lymphangitis, No petechiae, No rashes, no synovitis; no cyanosis or clubbing   Data Reviewed: Basic Metabolic Panel:  Recent Labs Lab 08/01/15 1020 08/02/15 0107  NA 135* 131*  K 4.0 3.2*  CL  --  97*  CO2 28 26  GLUCOSE 148* 112*  BUN 16.9 13  CREATININE 0.9 0.81  CALCIUM 9.3 7.6*   Liver Function Tests:  Recent Labs Lab 08/01/15 1020  AST 20  ALT 24  ALKPHOS 86  BILITOT <0.30  PROT 7.5  ALBUMIN 2.2*   No results for input(s): LIPASE, AMYLASE in the last 168 hours. No results for input(s): AMMONIA in the last 168 hours. CBC:  Recent Labs Lab 08/01/15 1019 08/02/15 0107  WBC 15.2* 18.4*  NEUTROABS 13.5*  --   HGB 9.6* 7.5*  HCT 30.1* 23.5*  MCV 87.7 89.4  PLT 640* 496*   Cardiac Enzymes: No results for input(s): CKTOTAL, CKMB, CKMBINDEX, TROPONINI in the last 168 hours. BNP: Invalid input(s): POCBNP CBG: No results for input(s): GLUCAP in the last 168 hours.  No results found for this or any previous visit (from the past 240 hour(s)).   Scheduled Meds: . cefTRIAXone (ROCEPHIN)  IV  1 g Intravenous Q24H  . enoxaparin (LOVENOX) injection  40 mg Subcutaneous Q24H  . lactose free nutrition  237 mL Oral TID WC  . magnesium oxide  400 mg Oral Daily  . metronidazole  500 mg Intravenous Q8H  . pantoprazole  40 mg Oral QPM  . potassium chloride  10 mEq Oral Daily  . traMADol  50 mg Oral 4 times per day   Continuous Infusions: . sodium chloride 1,000 mL (08/01/15 2039)     Marlissa Emerick, DO  [facility] Pager [phone]  If 7PM-7AM, please contact night-coverage [email or web address] Password [email or web address] 08/02/2015, 8:16 AM   LOS: 1 day

## 2015-08-02 NOTE — Progress Notes (Signed)
Nursing Note: Pt had moderate amount of vomitus.Emptied.Pt requesting med for nausea.A: Page sent.wbb

## 2015-08-02 NOTE — Progress Notes (Signed)
Initial Nutrition Assessment  DOCUMENTATION CODES:   Severe malnutrition in context of chronic illness  INTERVENTION:   Monitor magnesium, potassium, and phosphorus daily for at least 3 days, MD to replete as needed, as pt is at risk for refeeding syndrome given severe malnutrition and poor PO intake, Low K levels.  -May need to consider an appetite stimulant when appropriate -Continue to provide Boost Plus TID, each supplement provides 360 kcal and 14 grams of protein. -Patient prefers Boost High Protein to Boost Plus, family may bring this from home (provides 15g protein and 240 kcal) -Encourage PO intake -RD to continue to monitor  NUTRITION DIAGNOSIS:   Malnutrition related to chronic illness as evidenced by percent weight loss, energy intake < or equal to 75% for > or equal to 1 month, mild depletion of muscle mass.  GOAL:   Patient will meet greater than or equal to 90% of their needs  MONITOR:   PO intake, Supplement acceptance, Labs, Weight trends, I & O's  REASON FOR ASSESSMENT:   Malnutrition Screening Tool    ASSESSMENT:   55 y.o. female with a history of recurrent cervical cancer s/p chemoradiation for initial therapy under the care of Dr. [name] and subsequent systemic chemotherapy (last cycle completed in early December with no evidence of disease recurrence). She is followed by [name] for bilateral ureteral obstruction s/p bilateral stent placement (last performed on 06/21/15) and a known large vesicovaginal fistula. Biopsies from fistula tract on 1/24 were benign. She began noticing stool from her vagina last Wednesday and she developed progressively increased fever to 101 today and was evaluated at the cancer center and sent to the [facility] ED for admission to [facility].  Patient reports poor appetite that has persisted over the past year since cancer diagnosis and treatments. Pt had some N/V last night but feels this was from taking medications without  having eaten anything all day. D/t poor PO intake, pt tries to consume 2-3 Boost High Protein drinks a day at home, this does not always happen. She does not like prefer Boost Plus which is provided from our formulary. Family states they will provide the supplements from home when they can, will leave the Boost Plus order as backup. Recommended TID. Pt states that her MD at the [facility] was considering an appetite stimulant for her prior to her symptoms which prompted her to be admitted. May need to consider. RD provided a toothbrush and toothpaste per pt request.  Pt reports 60 lb of weight loss over the past year. Per weight history, pt has lost 17 lb since November 2016 (11% wt loss x 3 months, significant for time frame). Pt with mild-moderate muscle depletion and no fat depletion.  Medications: Mag-Ox tablet daily, K-DUR tablet daily  Labs reviewed: Low Na, K  Diet Order:  Diet regular Room service appropriate?: Yes; Fluid consistency:: Thin  Skin:  Reviewed, no issues  Last BM:  3/6  Height:   Ht Readings from Last 1 Encounters:  08/01/15 5\' 4"  (1.626 m)    Weight:   Wt Readings from Last 1 Encounters:  08/01/15 136 lb (61.689 kg)    Ideal Body Weight:  54.5 kg  BMI:  Body mass index is 23.33 kg/(m^2).  Estimated Nutritional Needs:   Kcal:  1850-2050  Protein:  95-105g  Fluid:  2L/day  EDUCATION NEEDS:   No education needs identified at this time  Clayton Bibles, MS, RD, LDN Pager: [phone] After Hours Pager: [phone]

## 2015-08-02 NOTE — Consult Note (Signed)
Consult Note: [name]  Consult was requested by [name] and Dr Tat for the evaluation of Stacy Webster 55 y.o. 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  CC:  Chief Complaint  Patient presents with  . Weakness    Assessment/Plan:  Ms. WINNETTE GILLES  is a 55 y.o.  year old with advanced cervical cancer (last dose of chemotherapy with cddp, taxol and bevacizumab was December, 2016) and sepsis likely secondary to ascending urinary tract infection from mixing of enteric contents in uterus with urinary stents from UVF.   She has radiographic evidence concerning for recurrence in bilateral groins (enlarged nodes).   I am recommending that she be considered for palliative GI diversion (ileostomy vs colostomy pending operative findings regarding source of fistula). I discussed with the patient and Dr Barry Dienes that GI diversion would be permanent for her as she is not a candidate for resection of the pelvic structures and reanastamosis (history of recurrent disease at the sidewall soon after completing primary therapy).   I offered GU diversion with perc neph tubes, however the patient declined this as she is concerned about managing the bags.   I am very concerned that her inguinal adenopathy may represent recurrence. I am recommending biopsy to rule this out. An alternative explanation may be reactive nodes. If she has recurrence, this would be associated with a very poor prognosis as she has exhausted best options for chemotherapy, and would have to consider less effective chemotherapeutic regimens.  HPI: Stacy Webster is a 55 year old woman with a history of stage II B cervical adenocarcinoma. She was diagnosed with this condition in December of 2015. She was seen initially by Dr Genia Del in [place]. At the time of diagnosis she had positive lymph nodes in the pelvis on CT imaging. She was  treated with primary chemo-radiation which was completed on April 8th. The patient reports completing the course of therapy on schedule with no delays.  She reports that when she completed therapy there was some concern by her treating physicians that her response had been incomplete. They had planned to wait until she was further out from therapy before obtaining a PET scan to evaluate for response.   In May, 2016 she developed febrile morbidity and manifestionas of sepsis. She presented to the [facility] ER and was admitted and noted to have a urinary tract infection and right ureteral obstruction which was new. She was treated with pressor support, IV antibiotics, and a right perinephric drain for the right perinephric abscess.   On June 9th, 2016 she underwent PET/CT to evaluate for recurrent disease as the cause of her new right ureteral obstruction (CT had not clearly shown recurrence/progression). It demonstrated:  There is irregular soft tissue thickening along the posterior wall of the cecum which is hypermetabolic. SUV max is 6.8. This is worrisome for tumor infiltration. There are also metabolically active small mesenteric lymph nodes near the right colon (largest node measures 7 mm). The uterus remains enlarged with marked dilatation of the endometrial canal which is filled with fluid. This is likely due to cervical obstruction from the patient's cervical cancer. Several foci of hypermetabolism are noted in the myometrium. Could not exclude tumor implants. This could be residual tumor or radiation change. There are small pelvic sidewall lymph nodes but they are not hypermetabolic. There are borderline enlarged inguinal lymph nodes which are hypermetabolic.   She was started on salvage chemotherapy  with cisplatin, paclitaxel and bevacizumab on 11/19/14.   She has completed 3 cycles of cddp/taxol/bev before repeat imaging with CT scan of the chest abdomen and pelvis performed on 02/21/2015  revealed no new lesions, mild prominence and enhancement of the cervix consistent with treatment effect, small residual ileocolic and right abdominal mesenteric lymph nodes measuring 4-6 mm mildly improved. No distant metastasis. Right renal atrophy with resolving subcapsular seroma and hematoma.   She went on to receive an additional 3 cycles of cddp/taxol/bevacizumab with the last cycle on December 2016.   She underwent right ureteral stent change on September 29th, 2016. She developed symptoms of low pelvic pain in early October and was empirically treated with cipro with no resolution. She also began noticing vaginal discharge.  She was admitted to [facility] via the ED on 03/23/15 with presumed sepsis from complicated urinary tract infection. She was febrile with lower cramping abdominal pain and fevers to 102. Urine and blood cultures on admission were negative for growth (though she was on cipro at the time).  CT of the abdo/pelvis on admission 03/23/15 showed: Stable post treatment appearance of the uterus, cervix and vagina compared to September. Stable all appearance of the at adnexa and right gonadal vein. Sigmoid colon remains within normal limits. Retained stool throughout the low left colon and transverse colon. Oral contrast has reached the right colon. There is continued circumferential wall thickening in the distal ileum looped in the right lower quadrant located adjacent to the uterus and right adnexa. The terminal ileum itself appears relatively spared. No dilated small bowel. Decompressed stomach and duodenum.  During that admission in October 2016 she was taken to the operating room for a suction evacuation of presumed pyometrium. Biopsies from the cervix and uterus at that time were negative.  On 05/05/15 she completed her salvage course of 6 cycles of bev/cddp/taxol and a CT abdo/pelvis was performed on 06/09/15 which showed no measurable disease. She had been experiencing leakage of urine  vaginally and this CT confirmed a vesicovaginal/uterine fistula. This was confirmed at the time of cystoscopy with Dr Junious Silk on 06/21/15 when ureteral stents were placed. She was offered diversion with PCN's but declined.    Interval History:  On 07/27/15 she began passing liquid stool from her vagina. It was occasionally tar colored and with flecks of blood clots. She has also appreciated a tender groin mass on the left since February 2017. She began experiencing fevers and chills and was admitted to [facility] on 08/01/15 at which time a CT of the abdomen and pelvis was performed. It showed enteric contrast communicating within the uterine cavity. There were enlarged (and increased in dimension from prior CT) lymph nodes in bilateral inguinal regions.  Current Meds:  Current Facility-Administered Medications on File Prior to Encounter  Medication Dose Route Frequency Provider Last Rate Last Dose  . dextrose 5 % and 0.9% NaCl 1,000 mL with potassium chloride 20 mEq, magnesium sulfate 12 mEq, mannitol 12.5 g infusion   Intravenous Continuous Lennis Marion Downer, MD   Stopped at 03/01/15 1758  . dextrose 5 % and 0.9% NaCl 1,000 mL with potassium chloride 20 mEq, magnesium sulfate 12 mEq, mannitol 12.5 g infusion   Intravenous Continuous Lennis Marion Downer, MD   Stopped at 04/05/15 1845   Current Outpatient Prescriptions on File Prior to Encounter  Medication Sig Dispense Refill  . albuterol (PROVENTIL HFA;VENTOLIN HFA) 108 (90 BASE) MCG/ACT inhaler Inhale 2 puffs into the lungs every 6 (six) hours as needed for  wheezing or shortness of breath. Reported on 07/21/2015    . diphenoxylate-atropine (LOMOTIL) 2.5-0.025 MG per tablet Take 1 tablet by mouth as needed for diarrhea or loose stools. Reported on 07/21/2015  0  . docusate sodium (COLACE) 100 MG capsule Take 100 mg by mouth daily as needed for mild constipation. Reported on 07/21/2015    . HEMOCYTE 324 (106 FE) MG TABS Take 325 mg by mouth daily. Take 1 tablet  daily on empty stomach with Vitamin C tablet 30 tablet 3  . ibuprofen (ADVIL,MOTRIN) 200 MG tablet Take 400 mg by mouth every 6 (six) hours as needed. Reported on 06/23/2015    . lactose free nutrition (BOOST PLUS) LIQD Take 237 mLs by mouth 3 (three) times daily with meals. 60 Can 0  . LORazepam (ATIVAN) 1 MG tablet Place 1/2 - 1 tablet under the tongue or swallow evry 4-6 hrs as needed for nausea 30 tablet 0  . Magnesium Oxide 500 MG CAPS Take 500 mg by mouth daily.    . pantoprazole (PROTONIX) 40 MG tablet Take 1 tablet (40 mg total) by mouth daily. (Patient taking differently: Take 40 mg by mouth every evening. ) 30 tablet 3  . potassium chloride (K-DUR,KLOR-CON) 10 MEQ tablet Take 10 mEq by mouth daily.    . traMADol (ULTRAM) 50 MG tablet Take 1-2 tablets (50-100 mg total) by mouth every 8 (eight) hours as needed. 60 tablet 0  . vitamin C (ASCORBIC ACID) 500 MG tablet Take 500 mg by mouth daily.       Allergy:  Allergies  Allergen Reactions  . Morphine And Related Rash    Per pt, morphine also makes her vomit  . Zofran [Ondansetron Hcl] Rash    Veins with erythema after zofran infused.  . Codeine Nausea Only  . Hydrocodone Nausea And Vomiting  . Peach [Prunus Persica] Swelling and Rash    Social Hx:   Social History   Social History  . Marital Status: Married    Spouse Name: N/A  . Number of Children: N/A  . Years of Education: N/A   Occupational History  . Not on file.   Social History Main Topics  . Smoking status: Never Smoker   . Smokeless tobacco: Never Used  . Alcohol Use: No  . Drug Use: No  . Sexual Activity: Not on file   Other Topics Concern  . Not on file   Social History Narrative    Past Surgical Hx:  Past Surgical History  Procedure Laterality Date  . Cystoscopy w/ ureteral stent placement Right 10/13/2014    Procedure: CYSTOSCOPY WITH RETROGRADE PYELOGRAM/URETERAL STENT PLACEMENT;  Surgeon: Festus Aloe, MD;  Location: [facility];  Service:  Urology;  Laterality: Right;  . Transthoracic echocardiogram  10-13-2014    mild LVH,  ef 55-60%/  mild TR  . Tonsillectomy and adenoidectomy  age 15    age 41--  redo-- Adenoidectomy  . Laparoscopic cholecystectomy  1990's  . Cystoscopy with ureteroscopy and stent placement Right 02/18/2015    Procedure: CYSTO WITH RIGHT URETERAL STENT EXCHANGE, RIGHT URETEROSCOPY ;  Surgeon: Festus Aloe, MD;  Location: [facility];  Service: Urology;  Laterality: Right;  . Cystoscopy with biopsy N/A 02/18/2015    Procedure: CYSTOSCOPY WITH  BLADDER  BIOPSY;  Surgeon: Festus Aloe, MD;  Location: [facility];  Service: Urology;  Laterality: N/A;  . Cystoscopy w/ retrogrades Right 02/18/2015    Procedure: CYSTOSCOPY WITH RETROGRADE PYELOGRAM;  Surgeon: Festus Aloe, MD;  Location: [facility];  Service: Urology;  Laterality: Right;  . Dilation and curettage of uterus N/A 03/25/2015    Procedure: DILATATION AND CURETTAGE WITH ULTRASOUND;  Surgeon: Everitt Amber, MD;  Location: [facility];  Service: Gynecology;  Laterality: N/A;  . Cystoscopy/retrograde/ureteroscopy Bilateral 06/21/2015    Procedure: CYSTOSCOPY; EXAM UNDER ANESTHESIA; BLADDER BIOPSIES WITH FULGARATION; BILATERAL RETROGRADE PYLOGRAM; RIGHT URETERAL STENT EXCHANGE AND LEFT URETERAL STENT PLACEMENT;  Surgeon: Festus Aloe, MD;  Location: [facility];  Service: Urology;  Laterality: Bilateral;    Past Medical Hx:  Past Medical History  Diagnosis Date  . Hydronephrosis, right   . Ureteral stricture, right   . History of septic shock     05/ 2016  due to Peptostriptococcus/  right hydronephrosis and obstruction----  resolved  . 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 Mild intermittent asthma   . History of Clostridium difficile     10-15-2014  resolved  . Left rotator cuff tear arthropathy   . White coat syndrome without hypertension   . GERD  (gastroesophageal reflux disease)   . Frequency of urination   . Anemia associated with chemotherapy     Antineoplastic chemo  . Recurrent cervical cancer [facility]) oncologist-  dr Jazzmon Prindle/ dr Marko Plume    dx 12/ 2015---  Radiation and Chemo (07-06-2014 to 09-03-2014)  now recurrent ,  Stage IIB  w/ Mets to pelvic/ retroperitoneum---  CHEMOTHERAPY CURRENTLY  . 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 Cervical cancer ([place])   . Chronic constipation   . Urinary incontinence   . PONV (postoperative nausea and vomiting)     remote history -not recent    Past Gynecological History: cervical cancer Patient's last menstrual period was 11/05/2011.  Family Hx: No family history on file.  Review of Systems:  Constitutional  Feels generally unwell with discomfort and malaise  ENT Normal appearing ears and nares bilaterally Skin/Breast  No rash, sores, jaundice, itching, dryness Cardiovascular  No chest pain, shortness of breath, or edema  Pulmonary  No cough or wheeze.  Gastro Intestinal  No N&V. no abdominal pains. No diarrhea. + flatus Genito Urinary  + passage of stool and urine from vagina Musculo Skeletal  No myalgia, arthralgia, joint swelling or pain  Neurologic  No weakness, numbness, change in gait,  Psychology  No depression, anxiety, insomnia.   Vitals:  Blood pressure 107/62, pulse 86, temperature 97.8 F (36.6 C), temperature source Oral, resp. rate 16, height 5\' 4"  (1.626 m), weight 136 lb (61.689 kg), last menstrual period 11/05/2011, SpO2 98 %.  Physical Exam: WD in NAD Neck  Supple NROM, without any enlargements.  Lymph Node Survey Bilateral inguinal lymphadenopathy Left > right. Firm 2cm node on left that is minimally mobile. Skin  No rash/lesions/breakdown  Psychiatry  Alert and oriented to person, place, and time  Abdomen  Normoactive bowel sounds, abdomen soft, non-tender and thin without evidence of hernia. No masses appreciated. Back No CVA  tenderness Genito Urinary  Deferred as patient was examined in hospital bed without capability for Gyn exam. Extremities  No bilateral cyanosis, clubbing or edema.  45 minutes was spent in face to face counseling with the patient. Additional time was spent reviewing CT images and discusing her care plan with Dr's [facility] and Dr Tat.  Donaciano Eva, MD  08/02/2015, 4:40 PM

## 2015-08-02 NOTE — Assessment & Plan Note (Addendum)
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  She has been experiencing chronic, leaking urine.  Last Wednesday, 07/27/2015.  She also started having stool leaking from both her rectum and her urethra.  She has been wearing an adult diaper for the incontinence.  She is now complaining of burning and tenderness to the entire vulva area.  She denies any obvious dysuria or hematuria.  She does report fever to maximum 101.0 overnight.  Exam today reveals moderate to severe excoriation of the entire vulva.  This area is fairly tender with palpation.  Also, patient has obvious stool leaking from the urethra which is almost black in color.  Stool tested positive with Hemoccult test.  Was unable to obtain urine sample due to constant incontinence.  However, blood cultures 2 have are then obtained.  Blood culture results pending.  Patient was tachycardic while at the cancer center; but afebrile.  Blood counts reveal leukocytosis with white count 15.2.  Patient will be transported to the emergency department for further evaluation and management.  Differential diagnosis includes possible urosepsis.  Brief history report were called to the emergency department charge nurse; prior to the patient being transported to the emergency department via wheelchair.  Per the cancer Center nurse.  Per chart-patient has no CODE STATUS in place.  Therefore, patient should be considered a full code.

## 2015-08-03 ENCOUNTER — Inpatient Hospital Stay (HOSPITAL_COMMUNITY): Payer: BC Managed Care – PPO

## 2015-08-03 DIAGNOSIS — A419 Sepsis, unspecified organism: Principal | ICD-10-CM

## 2015-08-03 DIAGNOSIS — N823 Fistula of vagina to large intestine: Secondary | ICD-10-CM

## 2015-08-03 DIAGNOSIS — E876 Hypokalemia: Secondary | ICD-10-CM

## 2015-08-03 DIAGNOSIS — N820 Vesicovaginal fistula: Secondary | ICD-10-CM

## 2015-08-03 DIAGNOSIS — E871 Hypo-osmolality and hyponatremia: Secondary | ICD-10-CM

## 2015-08-03 DIAGNOSIS — N3000 Acute cystitis without hematuria: Secondary | ICD-10-CM

## 2015-08-03 LAB — BASIC METABOLIC PANEL
Anion gap: 9 (ref 5–15)
BUN: 11 mg/dL (ref 6–20)
CHLORIDE: 99 mmol/L — AB (ref 101–111)
CO2: 25 mmol/L (ref 22–32)
Calcium: 8 mg/dL — ABNORMAL LOW (ref 8.9–10.3)
Creatinine, Ser: 0.79 mg/dL (ref 0.44–1.00)
GFR calc Af Amer: >60 mL/min (ref >60)
GFR calc non Af Amer: >60 mL/min (ref >60)
GLUCOSE: 92 mg/dL (ref 65–99)
POTASSIUM: 3.4 mmol/L — AB (ref 3.5–5.1)
Sodium: 133 mmol/L — ABNORMAL LOW (ref 135–145)

## 2015-08-03 LAB — TYPE AND SCREEN
ABO/RH(D): A POS
Antibody Screen: NEGATIVE
UNIT DIVISION: 0

## 2015-08-03 LAB — CBC
HEMATOCRIT: 30.3 % — AB (ref 36.0–46.0)
Hemoglobin: 9.5 g/dL — ABNORMAL LOW (ref 12.0–15.0)
MCH: 28.4 pg (ref 26.0–34.0)
MCHC: 31.4 g/dL (ref 30.0–36.0)
MCV: 90.4 fL (ref 78.0–100.0)
Platelets: 506 10*3/uL — ABNORMAL HIGH (ref 150–400)
RBC: 3.35 MIL/uL — ABNORMAL LOW (ref 3.87–5.11)
RDW: 18.3 % — AB (ref 11.5–15.5)
WBC: 11.9 10*3/uL — ABNORMAL HIGH (ref 4.0–10.5)

## 2015-08-03 LAB — PREALBUMIN: Prealbumin: 5 mg/dL — ABNORMAL LOW (ref 18–38)

## 2015-08-03 LAB — MAGNESIUM: Magnesium: 1.8 mg/dL (ref 1.7–2.4)

## 2015-08-03 MED ORDER — NALOXONE HCL 0.4 MG/ML IJ SOLN
INTRAMUSCULAR | Status: AC
  Filled 2015-08-03: qty 1

## 2015-08-03 MED ORDER — MIDAZOLAM HCL 2 MG/2ML IJ SOLN
INTRAMUSCULAR | Status: AC | PRN
  Administered 2015-08-03: 1 mg via INTRAVENOUS
  Administered 2015-08-03: 0.5 mg via INTRAVENOUS

## 2015-08-03 MED ORDER — PIPERACILLIN-TAZOBACTAM 3.375 G IVPB
3.3750 g | Freq: Three times a day (TID) | INTRAVENOUS | Status: DC
  Administered 2015-08-03 – 2015-08-08 (×16): 3.375 g via INTRAVENOUS
  Filled 2015-08-03 (×18): qty 50

## 2015-08-03 MED ORDER — POTASSIUM CHLORIDE 10 MEQ/100ML IV SOLN
10.0000 meq | INTRAVENOUS | Status: AC
  Administered 2015-08-03 (×3): 10 meq via INTRAVENOUS
  Filled 2015-08-03 (×3): qty 100

## 2015-08-03 MED ORDER — SODIUM CHLORIDE 0.9 % IV SOLN
INTRAVENOUS | Status: AC
  Administered 2015-08-03: 18:00:00 via INTRAVENOUS
  Filled 2015-08-03 (×2): qty 1000

## 2015-08-03 MED ORDER — FENTANYL CITRATE (PF) 100 MCG/2ML IJ SOLN
INTRAMUSCULAR | Status: AC
  Filled 2015-08-03: qty 2

## 2015-08-03 MED ORDER — LORAZEPAM 2 MG/ML PO CONC
0.5000 mg | Freq: Four times a day (QID) | ORAL | Status: DC | PRN
  Administered 2015-08-04: 0.5 mg via SUBLINGUAL
  Filled 2015-08-03: qty 1

## 2015-08-03 MED ORDER — PANTOPRAZOLE SODIUM 40 MG IV SOLR
40.0000 mg | INTRAVENOUS | Status: DC
  Administered 2015-08-03 – 2015-08-06 (×4): 40 mg via INTRAVENOUS
  Filled 2015-08-03 (×5): qty 40

## 2015-08-03 MED ORDER — FENTANYL CITRATE (PF) 100 MCG/2ML IJ SOLN
INTRAMUSCULAR | Status: AC | PRN
  Administered 2015-08-03: 50 ug via INTRAVENOUS
  Administered 2015-08-03: 25 ug via INTRAVENOUS

## 2015-08-03 MED ORDER — FLUMAZENIL 0.5 MG/5ML IV SOLN
INTRAVENOUS | Status: AC
  Filled 2015-08-03: qty 5

## 2015-08-03 MED ORDER — MIDAZOLAM HCL 2 MG/2ML IJ SOLN
INTRAMUSCULAR | Status: AC
  Filled 2015-08-03: qty 4

## 2015-08-03 NOTE — Sedation Documentation (Signed)
Patient denies pain and is resting comfortably.  

## 2015-08-03 NOTE — Progress Notes (Signed)
Subjective: She is having more abdominal pain and some nausea, she has some inguinal nodes and there is a plan to biopsy the larger one on the left today also.    Objective: Vital signs in last 24 hours: Temp:  [97.8 F (36.6 C)-99 F (37.2 C)] 98.7 F (37.1 C) (03/08 0635) Pulse Rate:  [86-97] 86 (03/08 0635) Resp:  [16-17] 17 (03/08 0635) BP: (106-135)/(55-69) 114/60 mmHg (03/08 0635) SpO2:  [98 %-100 %] 100 % (03/08 0635) Last BM Date: 08/02/15 360 PO  NPO Voided x 6 BM x 3 Afebrile, VSS Na 133, K+ 3.4 WBC down to 11.9 For BE today Intake/Output from previous day: 03/07 0701 - 03/08 0700 In: 1545 [P.O.:360; I.V.:850; Blood:335] Out: -  Intake/Output this shift:    General appearance: alert, cooperative, no distress and some nausea and more abdominal pain this AM.  GI: soft, a little more pain this AM, not distended.  Nodes inguinal areas and the one on the left is tender.  Lab Results:   Recent Labs  08/02/15 0107 08/03/15 0814  WBC 18.4* 11.9*  HGB 7.5* 9.5*  HCT 23.5* 30.3*  PLT 496* 506*    BMET  Recent Labs  08/02/15 0107 08/03/15 0814  NA 131* 133*  K 3.2* 3.4*  CL 97* 99*  CO2 26 25  GLUCOSE 112* 92  BUN 13 11  CREATININE 0.81 0.79  CALCIUM 7.6* 8.0*   PT/INR  Recent Labs  08/01/15 2209  LABPROT 15.3*  INR 1.19     Recent Labs Lab 08/01/15 1020  AST 20  ALT 24  ALKPHOS 86  BILITOT <0.30  PROT 7.5  ALBUMIN 2.2*     Lipase  No results found for: LIPASE   Studies/Results: Ct Abdomen Pelvis Wo Contrast  08/01/2015  CLINICAL DATA:  Sepsis, high grade fever, passing fecal material per urethra, personal history of cervical cancer with chemotherapy and radiation as well as vaginal fistula, personal history of bilateral ureteral stents, concern for colo vesicular fistula EXAM: CT ABDOMEN AND PELVIS WITHOUT CONTRAST TECHNIQUE: Multidetector CT imaging of the abdomen and pelvis was performed following the standard protocol without IV  contrast. COMPARISON:  06/09/2015 FINDINGS: Lower chest: The visualized portions of the lung bases and heart are clear. Hepatobiliary: Status post cholecystectomy. The liver appears normal. Pancreas: Normal Spleen: Normal Adrenals/Urinary Tract: Adrenal glands are normal. Right kidney is smaller than the left. Perinephric right fluid collection unchanged. Bilateral ureteral stents present. Bladder is decompressed with wall thickening and there is air within the bladder. Stomach/Bowel: The stomach is normal. Small bowel is normal except for stable wall thickening of the distal ileum. There is significant diffuse rectal wall thickening, slightly more prominent when compared to the prior study. Wall thickening also involves nearly the entire sigmoid colon, which represents a change from prior study. Vascular/Lymphatic: No evidence of aortic dilatation. No significant retroperitoneal adenopathy. Index right inguinal lymph node previously measured 13 mm and measures 13 mm once again today. Left inguinal lymph node series 2, image number 84 is larger, measuring 16 mm on the current examination. It was not enlarged previously. On image number 78 left inguinal adenopathy measures 18 mm in diameter as opposed to 7 mm previously. Reproductive: There are unchanged calcifications involving the endometrium and are again likely related to fibroids. When compared to the prior study there is increased air within the endometrial canal. There is also again air in the region of the vaginal fornices and cervical canal. Oral contrast is also seen  into the endocervical canal. Seen best on sagittal images, there is a thin tract of contrast extending anteriorly from the cervix to the bladder. This is seen best on sagittal series image number 60. Stable bilateral ovarian cysts or follicles. Other: No significant ascites Musculoskeletal: No acute musculoskeletal findings IMPRESSION: 1.  Increased inguinal adenopathy 2.  Stable radiation  enteritis 3. Stable right perinephric fluid and ureteral stents. No hydronephrosis. 4. The presence of abnormal air is noted within the endometrium and within the bladder, implying the possibility of infection and also of fistula to both structures. On this examination which was performed with oral contrast only, the presence of contrast within the cervical canal confirms known rectovaginal fistula. Seen best on the sagittal images, there also appears to be fistula extending anteriorly from the cervical region to the bladder. 5. Increased wall thickening of the rectum and most of the sigmoid colon, suggesting possibility of radiation colitis. Electronically Signed   By: Skipper Cliche M.D.   On: 08/01/2015 17:33   Dg Chest 2 View  08/01/2015  CLINICAL DATA:  Fever for 3 weeks getting progressively worse with weakness EXAM: CHEST  2 VIEW COMPARISON:  03/23/2015 FINDINGS: The heart size and mediastinal contours are within normal limits. Both lungs are clear. The visualized skeletal structures are unremarkable. Lordotic positioning and mild rotation appears to cause subtle prominence of the ascending aorta. IMPRESSION: No active cardiopulmonary disease. Electronically Signed   By: Skipper Cliche M.D.   On: 08/01/2015 14:03    Medications: . enoxaparin (LOVENOX) injection  40 mg Subcutaneous Q24H  . lactose free nutrition  237 mL Oral TID WC  . lactose free nutrition  237 mL Oral TID BM  . magnesium oxide  400 mg Oral Daily  . pantoprazole  40 mg Oral QPM  . piperacillin-tazobactam (ZOSYN)  IV  3.375 g Intravenous Q8H  . potassium chloride  10 mEq Oral Daily  . 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 Chemotherapy and radiation therapy (last treatment in Dec 2016) Sepsis/UTI Anemia - being transfused now Malnutrition  10 lbs last month, and 54 pounds last year. Antibiotics: day 2 Ceftriaxone and Flagyl; converted to Zosyn 08/03/15 DVT: Lovenox/SCD  Plan:  Node biopsy by IR and barium enema are ordered for today.      LOS: 2 days    Stacy Webster 08/03/2015

## 2015-08-03 NOTE — Progress Notes (Signed)
Patient ID: Stacy Webster, female   DOB: 05/15/1961, 55 y.o.   MRN: [id number]    Referring Physician(s): Rossi,Emma  Supervising Physician: Sandi Mariscal  Chief Complaint: Cervical cancer, inguinal adenopathy   Subjective: Patient familiar to IR service from prior CT-guided drainage of a right perinephric abscess on 10/12/14. She was recently admitted to the hospital with sepsis, high-grade fever and passage of fecal material per urethra. She has history of advanced cervical cancer with prior chemotherapy and radiation as well as a vaginal fistula, and prior bilateral ureteral stents. Recent CT of the abdomen and pelvis on 3/6 revealed increased inguinal adenopathy, stable radiation enteritis, stable right perinephric fluid and ureteral stents. There was abnormal air noted within the endometrium and within the bladder raising the possibility of infection and also fistula to both structures. There was contrast noted within the cervical canal confirming known rectovaginal fistula. There was also what appeared to be a fistula extending anteriorly from the cervical region to the bladder. 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 Request has now been received from GYN oncology for ultrasound-guided left inguinal lymph node biopsy. Patient currently denies fever, chest pain, dyspnea, back pain. She continues to have abdominal/pelvic pain, occasional nausea/vomiting and some reports of bleeding/stool from vagina and rectum.   Allergies: Morphine and related; Zofran; Codeine; Hydrocodone; and Peach  Medications: Prior to Admission medications   Medication Sig Start Date End Date Taking? Authorizing Provider  albuterol (PROVENTIL HFA;VENTOLIN HFA) 108 (90 BASE) MCG/ACT inhaler Inhale 2 puffs into the lungs every 6 (six) hours as needed for wheezing or shortness of breath. Reported on 07/21/2015   Yes Historical Provider, MD  diphenoxylate-atropine (LOMOTIL) 2.5-0.025 MG per tablet Take 1 tablet  by mouth as needed for diarrhea or loose stools. Reported on 07/21/2015 08/10/14  Yes Historical Provider, MD  docusate sodium (COLACE) 100 MG capsule Take 100 mg by mouth daily as needed for mild constipation. Reported on 07/21/2015 11/23/14  Yes Historical Provider, MD  gabapentin (NEURONTIN) 100 MG capsule Take 100 mg by mouth daily as needed (pain).   Yes Historical Provider, MD  HEMOCYTE 324 (106 FE) MG TABS Take 325 mg by mouth daily. Take 1 tablet daily on empty stomach with Vitamin C tablet 11/18/14  Yes Lennis Marion Downer, MD  ibuprofen (ADVIL,MOTRIN) 200 MG tablet Take 400 mg by mouth every 6 (six) hours as needed. Reported on 06/23/2015   Yes Historical Provider, MD  lactose free nutrition (BOOST PLUS) LIQD Take 237 mLs by mouth 3 (three) times daily with meals. 10/18/14  Yes Thurnell Lose, MD  LORazepam (ATIVAN) 1 MG tablet Place 1/2 - 1 tablet under the tongue or swallow evry 4-6 hrs as needed for nausea 07/21/15  Yes Lennis Marion Downer, MD  Magnesium Oxide 500 MG CAPS Take 500 mg by mouth daily.   Yes Historical Provider, MD  pantoprazole (PROTONIX) 40 MG tablet Take 1 tablet (40 mg total) by mouth daily. Patient taking differently: Take 40 mg by mouth every evening.  06/09/15  Yes Lennis Marion Downer, MD  potassium chloride (K-DUR,KLOR-CON) 10 MEQ tablet Take 10 mEq by mouth daily.   Yes Historical Provider, MD  traMADol (ULTRAM) 50 MG tablet Take 1-2 tablets (50-100 mg total) by mouth every 8 (eight) hours as needed. 07/21/15  Yes Lennis Marion Downer, MD  vitamin C (ASCORBIC ACID) 500 MG tablet Take 500 mg by mouth daily.   Yes Historical Provider, MD     Vital Signs: BP 114/60 mmHg  Pulse  86  Temp(Src) 98.7 F (37.1 C) (Oral)  Resp 17  Ht 5\' 4"  (1.626 m)  Wt 136 lb (61.689 kg)  BMI 23.33 kg/m2  SpO2 100%  LMP 11/05/2011  Physical Exam patient awake, alert; Chest with clear breath sounds bilaterally. Heart with regular rate and rhythm.  Abdomen soft, positive bowel sounds, diffuse  tenderness with noted inguinal adenopathy, left greater than right; lower extremities with no edema  Imaging: Ct Abdomen Pelvis Wo Contrast  08/01/2015  CLINICAL DATA:  Sepsis, high grade fever, passing fecal material per urethra, personal history of cervical cancer with chemotherapy and radiation as well as vaginal fistula, personal history of bilateral ureteral stents, concern for colo vesicular fistula EXAM: CT ABDOMEN AND PELVIS WITHOUT CONTRAST TECHNIQUE: Multidetector CT imaging of the abdomen and pelvis was performed following the standard protocol without IV contrast. COMPARISON:  06/09/2015 FINDINGS: Lower chest: The visualized portions of the lung bases and heart are clear. Hepatobiliary: Status post cholecystectomy. The liver appears normal. Pancreas: Normal Spleen: Normal Adrenals/Urinary Tract: Adrenal glands are normal. Right kidney is smaller than the left. Perinephric right fluid collection unchanged. Bilateral ureteral stents present. Bladder is decompressed with wall thickening and there is air within the bladder. Stomach/Bowel: The stomach is normal. Small bowel is normal except for stable wall thickening of the distal ileum. There is significant diffuse rectal wall thickening, slightly more prominent when compared to the prior study. Wall thickening also involves nearly the entire sigmoid colon, which represents a change from prior study. Vascular/Lymphatic: No evidence of aortic dilatation. No significant retroperitoneal adenopathy. Index right inguinal lymph node previously measured 13 mm and measures 13 mm once again today. Left inguinal lymph node series 2, image number 84 is larger, measuring 16 mm on the current examination. It was not enlarged previously. On image number 78 left inguinal adenopathy measures 18 mm in diameter as opposed to 7 mm previously. Reproductive: There are unchanged calcifications involving the endometrium and are again likely related to fibroids. When compared to  the prior study there is increased air within the endometrial canal. There is also again air in the region of the vaginal fornices and cervical canal. Oral contrast is also seen into the endocervical canal. Seen best on sagittal images, there is a thin tract of contrast extending anteriorly from the cervix to the bladder. This is seen best on sagittal series image number 60. Stable bilateral ovarian cysts or follicles. Other: No significant ascites Musculoskeletal: No acute musculoskeletal findings IMPRESSION: 1.  Increased inguinal adenopathy 2.  Stable radiation enteritis 3. Stable right perinephric fluid and ureteral stents. No hydronephrosis. 4. The presence of abnormal air is noted within the endometrium and within the bladder, implying the possibility of infection and also of fistula to both structures. On this examination which was performed with oral contrast only, the presence of contrast within the cervical canal confirms known rectovaginal fistula. Seen best on the sagittal images, there also appears to be fistula extending anteriorly from the cervical region to the bladder. 5. Increased wall thickening of the rectum and most of the sigmoid colon, suggesting possibility of radiation colitis. Electronically Signed   By: Skipper Cliche M.D.   On: 08/01/2015 17:33   Dg Chest 2 View  08/01/2015  CLINICAL DATA:  Fever for 3 weeks getting progressively worse with weakness EXAM: CHEST  2 VIEW COMPARISON:  03/23/2015 FINDINGS: The heart size and mediastinal contours are within normal limits. Both lungs are clear. The visualized skeletal structures are unremarkable. Lordotic positioning  and mild rotation appears to cause subtle prominence of the ascending aorta. IMPRESSION: No active cardiopulmonary disease. Electronically Signed   By: Skipper Cliche M.D.   On: 08/01/2015 14:03    Labs:  CBC:  Recent Labs  07/21/15 1511 08/01/15 1019 08/02/15 0107 08/03/15 0814  WBC 10.8* 15.2* 18.4* 11.9*  HGB  10.0* 9.6* 7.5* 9.5*  HCT 30.4* 30.1* 23.5* 30.3*  PLT 555* 640* 496* 506*    COAGS:  Recent Labs  10/12/14 0500 08/01/15 2209  INR 1.42 1.19  APTT 35 32    BMP:  Recent Labs  03/26/15 0558 03/28/15 0428  07/21/15 1511 08/01/15 1020 08/02/15 0107 08/03/15 0814  NA 139 140  < > 135* 135* 131* 133*  K 4.3 3.6  < > 4.5 4.0 3.2* 3.4*  CL 107 105  --   --   --  97* 99*  CO2 26 27  < > 30* 28 26 25   GLUCOSE 181* 91  < > 131 148* 112* 92  BUN 14 11  < > 17.3 16.9 13 11   CALCIUM 8.2* 8.3*  < > 9.1 9.3 7.6* 8.0*  CREATININE 1.00 1.12*  < > 0.9 0.9 0.81 0.79  GFRNONAA >60 55*  --   --   --  >60 >60  GFRAA >60 >60  --   --   --  >60 >60  < > = values in this interval not displayed.  LIVER FUNCTION TESTS:  Recent Labs  06/07/15 1259 06/23/15 0937 07/21/15 1511 08/01/15 1020  BILITOT <0.30 0.37 <0.30 <0.30  AST 23 11 30 20   ALT 29 <9 37 24  ALKPHOS 86 58 78 86  PROT 7.7 7.6 7.1 7.5  ALBUMIN 2.6* 2.8* 2.1* 2.2*    Assessment and Plan: Pt with history of advanced cervical cancer with prior chemotherapy and radiation as well as a vaginal fistula, and prior bilateral ureteral stents. Recent CT of the abdomen and pelvis on 3/6 revealed increased inguinal adenopathy, stable radiation enteritis, stable right perinephric fluid and ureteral stents. There was abnormal air noted within the endometrium and within the bladder raising the possibility of infection and also fistula to both structures. There was contrast noted within the cervical canal confirming known rectovaginal fistula. There was also what appeared to be a fistula extending anteriorly from the cervical region to the bladder. 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 Request has now been received from GYN oncology for ultrasound-guided left inguinal lymph node biopsy.Imaging studies have been reviewed by Dr. Pascal Lux. Plan at this time is for ultrasound-guided left inguinal lymph node biopsy later today. Risks and  benefits discussed with the patient including, but not limited to bleeding, infection, damage to adjacent structures or low yield requiring additional tests.All of the patient's questions were answered, patient is agreeable to proceed.Consent signed and in chart.     Electronically Signed: D. Rowe Robert 08/03/2015, 11:06 AM   I spent a total of 20 minutes at the the patient's bedside AND on the patient's hospital floor or unit, greater than 50% of which was counseling/coordinating care for ultrasound-guided left inguinal lymph node biopsy

## 2015-08-03 NOTE — Progress Notes (Signed)
MEDICATION RELATED CONSULT NOTE   IR Procedure Consult - Anticoagulant/Antiplatelet PTA/Inpatient Med List Review by Pharmacist    Procedure:   US guided biopsy of dominant left inguinal lymph node Completed: 3/8 1730 Post-Procedural bleeding risk per IR MD assessment:  Standard  Antithrombotic medications on inpatient or PTA profile prior to procedure:   Lovenox 40 mg SQ q24h    Recommended restart time per IR Post-Procedure Guidelines:  Day + 1 (next AM)   Plan:     Delay next dose of Lovenox 40 mg SQ q24h to tomorrow morning.  Stacy Webster, PharmD, BCPS Pager: [phone] 08/03/2015 8:02 PM

## 2015-08-03 NOTE — Progress Notes (Signed)
0.5mg  Versed and 23mcg Fentanyl was wasted with Dr. Pascal Lux.

## 2015-08-03 NOTE — Progress Notes (Signed)
   Patient is waiting for inguinal lymph node biopsy.  She has some nausea.  Her kidney function remains normal.  Plan is also for barium enema to determine site of bowel fistula.  GU available if needed.  Please call with any questions or concerns.

## 2015-08-03 NOTE — Procedures (Signed)
Technically successful US guided biopsy of dominant left inguinal lymph node.   EBL: Minimal   No immediate complications.   Ronny Bacon, MD Pager #: [phone]

## 2015-08-03 NOTE — Progress Notes (Signed)
PROGRESS NOTE    Stacy Webster  [id number]  DOB: Aug 11, 1960  DOA: 08/01/2015 PCP: [name], MD Outpatient Specialists:   Hospital course: 55 y/o female with hx of metastatic cervical cancer, bilateral ureteral strictures status post stents ([name]) presented with 3 day history of passing stool from her vagina. The patient has had low-grade fevers for the better part of one month up to 101.41F prior to admission. The patient presented to the cancer center and saw Drue Second on 08/01/2015. She was noted to have a fever up to 101.39F with tachycardia. As result, the patient was admitted for further evaluation. 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 The patient last received radiation April 2016.    Assessment & Plan:   Sepsis -Present at the time of admission--she presented with fever and tachycardia -Likely due to colitis/rectovaginal fistula/vaginitis - Lactic acid 1.0, Procalcitonin 0.23 - Blood cultures 2: Negative to date. Urine culture: Shows 50 K colonies of enterococcus-sensitivities pending. - Treated with IV fluids and empiric IV Rocephin and Flagyl. Due to persistent nausea and dry heaves, antibiotics changed to Zosyn on 3/8.  - Sepsis physiology improved.  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 surgery consulted and evaluating for possible diverting colostomy. As per oncology note, 3 months out from last Avastin so okay for surgical procedure from that standpoint  Vesicovaginal fistula/uterine  fistula, ureteral stricture- status post bilateral ureteral stents 06/21/15, & ? UTI -Follows [name] -06/21/2015 fistula biopsy was benign -06/21/2015 Right ureteral stent change, new left ureteral stent - Urology input appreciated. Patient declined bilateral percutaneous nephrostomy.  Recurrent/progressive adenocarcinoma of the cervix -Drs. Bridget Hartshorn input appreciated -Last chemotherapy 05/05/2015  - GYN oncology have requested lymph node biopsy by IR to determine extent of disease.  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 We'll reduce IV fluids.  Hypokalemia -replete IV and follow. - Magnesium 1.8.  DVT prophylaxis: Lovenox Code Status: Full Family Communication: Discussed at length with patient's parents and sister at bedside on 3/8 Disposition Plan: DC home when medically stable.   Consultants:  Urology  General surgery  GYN oncology  Medical oncology  Interventional radiology  Procedures:  None  Antimicrobials:  IV Rocephin 3/6 > 3/7  IV Flagyl 3/6 > 3/7  IV Zosyn 3/8 >   Subjective: Persistent nausea and dry heaves.? Metallic taste. Abdominal bloating. Had BM. Overall feels much better, stronger.  Objective: Filed Vitals:   08/02/15 1424 08/02/15 1635 08/02/15 2112 08/03/15 0635  BP: 107/62 106/64 135/69 114/60  Pulse: 86  86 97  86  Temp: 97.8 F (36.6 C) 98.9 F (37.2 C) 99 F (37.2 C) 98.7 F (37.1 C)  TempSrc: Oral Oral Oral Oral  Resp:   17 17  Height:      Weight:      SpO2: 98% 99% 100% 100%    Intake/Output Summary (Last 24 hours) at 08/03/15 1432 Last data filed at 08/03/15 0700  Gross per 24 hour  Intake   1545 ml  Output      0 ml  Net   1545 ml   Filed Weights   08/01/15 1330  Weight: 61.689 kg (136 lb)    Exam:  General exam: Pleasant middle-aged female sitting up comfortably in bed this morning. Did not appear septic or toxic. Respiratory system: Clear. No increased work of breathing. Cardiovascular system: S1 & S2 heard, RRR. No JVD, murmurs, gallops, clicks or pedal edema. Gastrointestinal system: Abdomen is mildly distended, soft but nontender. Normal bowel sounds heard. Central nervous system: Alert and oriented. No focal neurological deficits. Extremities: Symmetric 5 x 5 power.   Data Reviewed: Basic Metabolic Panel:  Recent Labs Lab 08/01/15 1020 08/02/15 0107 08/03/15 0814  NA 135* 131* 133*  K 4.0 3.2* 3.4*  CL  --  97* 99*  CO2 28 26 25   GLUCOSE 148* 112* 92  BUN 16.9 13 11   CREATININE 0.9 0.81 0.79  CALCIUM 9.3 7.6* 8.0*  MG  --   --  1.8   Liver Function Tests:  Recent Labs Lab 08/01/15 1020  AST 20  ALT 24  ALKPHOS 86  BILITOT <0.30  PROT 7.5  ALBUMIN 2.2*   No results for input(s): LIPASE, AMYLASE in the last 168 hours. No results for input(s): AMMONIA in the last 168 hours. CBC:  Recent Labs Lab 08/01/15 1019 08/02/15 0107 08/03/15 0814  WBC 15.2* 18.4* 11.9*  NEUTROABS 13.5*  --   --   HGB 9.6* 7.5* 9.5*  HCT 30.1* 23.5* 30.3*  MCV 87.7 89.4 90.4  PLT 640* 496* 506*   Cardiac Enzymes: No results for input(s): CKTOTAL, CKMB, CKMBINDEX, TROPONINI in the last 168 hours. BNP (last 3 results) No results for input(s): PROBNP in the last 8760 hours. CBG: No results for input(s): GLUCAP in the last 168 hours.  Recent Results  (from the past 240 hour(s))  Culture, Blood     Status: None (Preliminary result)   Collection Time: 08/01/15 10:20 AM  Result Value Ref Range Status   BLOOD CULTURE, ROUTINE Preliminary report  Preliminary   RESULT 1 Comment  Preliminary    Comment: No growth in 36 - 48 hours.  Culture, Blood     Status: None (Preliminary result)   Collection Time: 08/01/15 10:51 AM  Result Value Ref Range Status   BLOOD CULTURE, ROUTINE Preliminary report  Preliminary   RESULT 1 Comment  Preliminary    Comment: No growth in 36 - 48 hours.  Urine culture     Status: None (Preliminary result)   Collection Time: 08/02/15 11:03 AM  Result Value Ref Range Status   Specimen Description URINE, CLEAN CATCH  Final   Special Requests NONE  Final   Culture   Final    50,000 COLONIES/mL ENTEROCOCCUS SPECIES Performed at [facility]    Report Status PENDING  Incomplete         Studies: Ct Abdomen Pelvis Wo Contrast  08/01/2015  CLINICAL DATA:  Sepsis, high grade fever, passing fecal material per urethra, personal history of cervical cancer with  chemotherapy and radiation as well as vaginal fistula, personal history of bilateral ureteral stents, concern for colo vesicular fistula EXAM: CT ABDOMEN AND PELVIS WITHOUT CONTRAST TECHNIQUE: Multidetector CT imaging of the abdomen and pelvis was performed following the standard protocol without IV contrast. COMPARISON:  06/09/2015 FINDINGS: Lower chest: The visualized portions of the lung bases and heart are clear. Hepatobiliary: Status post cholecystectomy. The liver appears normal. Pancreas: Normal Spleen: Normal Adrenals/Urinary Tract: Adrenal glands are normal. Right kidney is smaller than the left. Perinephric right fluid collection unchanged. Bilateral ureteral stents present. Bladder is decompressed with wall thickening and there is air within the bladder. Stomach/Bowel: The stomach is normal. Small bowel is normal except for stable wall thickening of the  distal ileum. There is significant diffuse rectal wall thickening, slightly more prominent when compared to the prior study. Wall thickening also involves nearly the entire sigmoid colon, which represents a change from prior study. Vascular/Lymphatic: No evidence of aortic dilatation. No significant retroperitoneal adenopathy. Index right inguinal lymph node previously measured 13 mm and measures 13 mm once again today. Left inguinal lymph node series 2, image number 84 is larger, measuring 16 mm on the current examination. It was not enlarged previously. On image number 78 left inguinal adenopathy measures 18 mm in diameter as opposed to 7 mm previously. Reproductive: There are unchanged calcifications involving the endometrium and are again likely related to fibroids. When compared to the prior study there is increased air within the endometrial canal. There is also again air in the region of the vaginal fornices and cervical canal. Oral contrast is also seen into the endocervical canal. Seen best on sagittal images, there is a thin tract of contrast extending anteriorly from the cervix to the bladder. This is seen best on sagittal series image number 60. Stable bilateral ovarian cysts or follicles. Other: No significant ascites Musculoskeletal: No acute musculoskeletal findings IMPRESSION: 1.  Increased inguinal adenopathy 2.  Stable radiation enteritis 3. Stable right perinephric fluid and ureteral stents. No hydronephrosis. 4. The presence of abnormal air is noted within the endometrium and within the bladder, implying the possibility of infection and also of fistula to both structures. On this examination which was performed with oral contrast only, the presence of contrast within the cervical canal confirms known rectovaginal fistula. Seen best on the sagittal images, there also appears to be fistula extending anteriorly from the cervical region to the bladder. 5. Increased wall thickening of the rectum and  most of the sigmoid colon, suggesting possibility of radiation colitis. Electronically Signed   By: Skipper Cliche M.D.   On: 08/01/2015 17:33        Scheduled Meds: . enoxaparin (LOVENOX) injection  40 mg Subcutaneous Q24H  . lactose free nutrition  237 mL Oral TID WC  . lactose free nutrition  237 mL Oral TID BM  . magnesium oxide  400 mg Oral Daily  . pantoprazole  40 mg Oral QPM  . piperacillin-tazobactam (ZOSYN)  IV  3.375 g Intravenous Q8H  . potassium chloride  10 mEq Oral Daily  . traMADol  50 mg Oral 4 times per day   Continuous Infusions: . 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    Vernell Leep, MD, FACP, FHM. [facility] Pager [phone]  If 7PM-7AM, please contact night-coverage [email or web address] Password [email or web address] 08/03/2015, 2:32 PM    LOS: 2 days

## 2015-08-04 ENCOUNTER — Inpatient Hospital Stay (HOSPITAL_COMMUNITY): Payer: BC Managed Care – PPO

## 2015-08-04 DIAGNOSIS — D509 Iron deficiency anemia, unspecified: Secondary | ICD-10-CM

## 2015-08-04 DIAGNOSIS — R110 Nausea: Secondary | ICD-10-CM

## 2015-08-04 DIAGNOSIS — R634 Abnormal weight loss: Secondary | ICD-10-CM

## 2015-08-04 DIAGNOSIS — N828 Other female genital tract fistulae: Secondary | ICD-10-CM

## 2015-08-04 LAB — BASIC METABOLIC PANEL
ANION GAP: 10 (ref 5–15)
BUN: 10 mg/dL (ref 6–20)
CALCIUM: 8.1 mg/dL — AB (ref 8.9–10.3)
CO2: 26 mmol/L (ref 22–32)
Chloride: 102 mmol/L (ref 101–111)
Creatinine, Ser: 0.86 mg/dL (ref 0.44–1.00)
GLUCOSE: 91 mg/dL (ref 65–99)
POTASSIUM: 3.2 mmol/L — AB (ref 3.5–5.1)
Sodium: 138 mmol/L (ref 135–145)

## 2015-08-04 LAB — CBC
HEMATOCRIT: 27.5 % — AB (ref 36.0–46.0)
Hemoglobin: 8.8 g/dL — ABNORMAL LOW (ref 12.0–15.0)
MCH: 28.8 pg (ref 26.0–34.0)
MCHC: 32 g/dL (ref 30.0–36.0)
MCV: 89.9 fL (ref 78.0–100.0)
PLATELETS: 525 10*3/uL — AB (ref 150–400)
RBC: 3.06 MIL/uL — AB (ref 3.87–5.11)
RDW: 18.4 % — ABNORMAL HIGH (ref 11.5–15.5)
WBC: 9 10*3/uL (ref 4.0–10.5)

## 2015-08-04 LAB — URINE CULTURE: Culture: 50000

## 2015-08-04 LAB — SURGICAL PCR SCREEN
MRSA, PCR: NEGATIVE
Staphylococcus aureus: NEGATIVE

## 2015-08-04 MED ORDER — IOHEXOL 300 MG/ML  SOLN: 125.0000 mL | Freq: Once | INTRAMUSCULAR | Status: DC | PRN

## 2015-08-04 MED ORDER — POTASSIUM CHLORIDE CRYS ER 10 MEQ PO TBCR
30.0000 meq | EXTENDED_RELEASE_TABLET | ORAL | Status: AC
  Administered 2015-08-04 (×2): 30 meq via ORAL
  Filled 2015-08-04 (×2): qty 3

## 2015-08-04 MED ORDER — FLEET ENEMA 7-19 GM/118ML RE ENEM
1.0000 | ENEMA | Freq: Once | RECTAL | Status: AC
  Administered 2015-08-04: 1 via RECTAL
  Filled 2015-08-04: qty 1

## 2015-08-04 MED ORDER — SODIUM CHLORIDE 0.9 % IV SOLN
INTRAVENOUS | Status: DC
  Administered 2015-08-05: 11:00:00 via INTRAVENOUS
  Filled 2015-08-04 (×2): qty 1000

## 2015-08-04 MED ORDER — ALVIMOPAN 12 MG PO CAPS
12.0000 mg | ORAL_CAPSULE | Freq: Once | ORAL | Status: AC
  Administered 2015-08-05: 12 mg via ORAL
  Filled 2015-08-04: qty 1

## 2015-08-04 MED ORDER — ALVIMOPAN 12 MG PO CAPS: 12.0000 mg | ORAL_CAPSULE | Freq: Once | ORAL | Status: DC

## 2015-08-04 MED ORDER — IOHEXOL 300 MG/ML  SOLN
150.0000 mL | Freq: Once | INTRAMUSCULAR | Status: AC | PRN
  Administered 2015-08-04: 450 mL via INTRAVENOUS

## 2015-08-04 NOTE — Progress Notes (Signed)
PROGRESS NOTE    Stacy Webster  [id number]  DOB: 1961-02-04  DOA: 08/01/2015 PCP: [name], MD Outpatient Specialists:   Hospital course: 55 y/o female with hx of metastatic cervical cancer, bilateral ureteral strictures status post stents ([name]) presented with 3 day history of passing stool from her vagina. The patient has had low-grade fevers for the better part of one month up to 101.62F prior to admission. The patient presented to the cancer center and saw Drue Second on 08/01/2015. She was noted to have a fever up to 101.73F with tachycardia. As result, the patient was admitted for further evaluation. 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 The patient last received radiation April 2016. Patient declined percutaneous nephrostomies and urology signed off. Oncology/GYN oncology continue to follow. General surgery consulting and working up new rectovaginal fistula for possible divertion procedure.   Assessment & Plan:   Sepsis -Present at the time of admission--she presented with fever and tachycardia -Likely due to colitis/rectovaginal fistula/vaginitis - Lactic acid 1.0, Procalcitonin 0.23 - Blood cultures 2: Negative to date. Urine culture: Shows 50 K colonies of enterococcus-? Fecal contamination - Initially treated with IV fluids and empiric IV Rocephin and Flagyl. Due to persistent nausea and dry heaves, antibiotics changed to Zosyn on 3/8.  - Sepsis physiology improved.  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 surgery consulted and evaluating for possible diverting colostomy. As per oncology note, 3 months out from last Avastin so okay for surgical procedure from that standpoint - Patient planned for barium enema 3/9.  Vesicovaginal fistula/uterine fistula, ureteral stricture- status post bilateral ureteral stents 06/21/15, & ? UTI -Follows [name] -06/21/2015 fistula biopsy was benign -06/21/2015 Right ureteral stent change, new left ureteral stent - Urology input appreciated. Patient declined bilateral percutaneous nephrostomy.  Recurrent/progressive adenocarcinoma of the cervix -Drs. Bridget Hartshorn input appreciated -Last chemotherapy 05/05/2015  - Status post inguinal lymph node biopsy 3/8-results pending.  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 Hemoglobin 8.8 on 3/9-follow CBCs.  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 We'll reduce IV fluids.  Hypokalemia -Replace and follow. - Magnesium 1.8.  DVT prophylaxis: Lovenox Code Status: Full Family Communication: Discussed with patient's daughter at bedside on 3/9 Disposition Plan: DC home when medically stable.   Consultants:  Urology  General surgery  GYN oncology  Medical oncology  Interventional radiology  Procedures:  US guided biopsy of dominant left inguinal lymph node on 3/8 by  IR.Marland Kitchen  Antimicrobials:  IV Rocephin 3/6 > 3/7  IV Flagyl 3/6 > 3/7  IV Zosyn 3/8 >  Subjective: Overall feels much better compared to 3/8. Decreased abdominal bloating. Nausea and vomiting improved. States that she has BM every time she urinates. No abdominal pain.  Objective: Filed Vitals:   08/03/15 1726 08/03/15 1745 08/03/15 2128 08/04/15 0445  BP: 106/69 120/66 138/66 109/58  Pulse: 90 85 87 90  Temp:  98.6 F (37 C) 99 F (37.2 C) 98.4 F (36.9 C)  TempSrc:   Oral Oral  Resp: 14 16 16 16   Height:      Weight:      SpO2: 100% 100% 100% 100%   No intake or output data in the 24 hours ending 08/04/15 1246 Filed Weights   08/01/15 1330  Weight: 61.689 kg (136 lb)    Exam:  General exam: Pleasant middle-aged female lying comfortably supine in bed. Did not appear septic or toxic. Respiratory system: Clear. No increased work of breathing. Cardiovascular system: S1 & S2 heard, RRR. No JVD, murmurs, gallops, clicks or pedal edema. Gastrointestinal system: Abdomen is non- distended, soft & nontender. Normal bowel sounds heard. Central nervous system: Alert and oriented. No focal neurological deficits. Extremities: Symmetric 5 x 5 power.   Data Reviewed: Basic Metabolic Panel:  Recent Labs Lab 08/01/15 1020 08/02/15 0107 08/03/15 0814 08/04/15 0333  NA 135* 131* 133* 138  K 4.0 3.2* 3.4* 3.2*  CL  --  97* 99* 102  CO2 28 26 25 26   GLUCOSE 148* 112* 92 91  BUN 16.9 13 11 10   CREATININE 0.9 0.81 0.79 0.86  CALCIUM 9.3 7.6* 8.0* 8.1*  MG  --   --  1.8  --    Liver Function Tests:  Recent Labs Lab 08/01/15 1020  AST 20  ALT 24  ALKPHOS 86  BILITOT <0.30  PROT 7.5  ALBUMIN 2.2*   No results for input(s): LIPASE, AMYLASE in the last 168 hours. No results for input(s): AMMONIA in the last 168 hours. CBC:  Recent Labs Lab 08/01/15 1019 08/02/15 0107 08/03/15 0814 08/04/15 0333  WBC 15.2* 18.4* 11.9* 9.0  NEUTROABS 13.5*  --   --   --   HGB  9.6* 7.5* 9.5* 8.8*  HCT 30.1* 23.5* 30.3* 27.5*  MCV 87.7 89.4 90.4 89.9  PLT 640* 496* 506* 525*   Cardiac Enzymes: No results for input(s): CKTOTAL, CKMB, CKMBINDEX, TROPONINI in the last 168 hours. BNP (last 3 results) No results for input(s): PROBNP in the last 8760 hours. CBG: No results for input(s): GLUCAP in the last 168 hours.  Recent Results (from the past 240 hour(s))  Culture, Blood     Status: None (Preliminary result)   Collection Time: 08/01/15 10:20 AM  Result Value Ref Range Status   BLOOD CULTURE, ROUTINE Preliminary report  Preliminary   RESULT 1 Comment  Preliminary    Comment: No growth in 36 - 48 hours.  Culture, Blood     Status: None (Preliminary result)   Collection Time: 08/01/15 10:51 AM  Result Value Ref Range Status   BLOOD CULTURE, ROUTINE Preliminary report  Preliminary   RESULT 1 Comment  Preliminary    Comment: No growth in 36 - 48 hours.  Culture, blood (routine x 2)     Status: None (Preliminary result)   Collection Time: 08/01/15 10:04 PM  Result Value Ref Range Status   Specimen Description BLOOD RIGHT HAND  Final   Special Requests BOTTLES DRAWN AEROBIC AND ANAEROBIC 6 CC  Final   Culture   Final    NO GROWTH 1 DAY  Performed at [facility]    Report Status PENDING  Incomplete  Culture, blood (routine x 2)     Status: None (Preliminary result)   Collection Time: 08/01/15 10:08 PM  Result Value Ref Range Status   Specimen Description BLOOD LEFT HAND  Final   Special Requests BOTTLES DRAWN AEROBIC AND ANAEROBIC 6 ML  Final   Culture   Final    NO GROWTH 1 DAY Performed at [facility]    Report Status PENDING  Incomplete  Urine culture     Status: None   Collection Time: 08/02/15 11:03 AM  Result Value Ref Range Status   Specimen Description URINE, CLEAN CATCH  Final   Special Requests NONE  Final   Culture   Final    50,000 COLONIES/mL ENTEROCOCCUS SPECIES Performed at [facility]    Report Status  08/04/2015 FINAL  Final   Organism ID, Bacteria ENTEROCOCCUS SPECIES  Final      Susceptibility   Enterococcus species - MIC*    AMPICILLIN <=2 SENSITIVE Sensitive     LEVOFLOXACIN 1 SENSITIVE Sensitive     NITROFURANTOIN <=16 SENSITIVE Sensitive     VANCOMYCIN 1 SENSITIVE Sensitive     * 50,000 COLONIES/mL ENTEROCOCCUS SPECIES         Studies: US Biopsy  08/03/2015  INDICATION: History of cervical cancer, now with bilateral inguinal lymphadenopathy. Previous from ultrasound-guided biopsy for tissue diagnostic purposes. EXAM: ULTRASOUND BIOPSY CORE LIVER COMPARISON:  CT abdomen pelvis -08/01/2015 MEDICATIONS: None ANESTHESIA/SEDATION: Moderate (conscious) sedation was employed during this procedure. A total of Versed 1.5 mg and Fentanyl 75 mcg was administered intravenously. Moderate Sedation Time: 10 minutes. The patient's level of consciousness and vital signs were monitored continuously by radiology nursing throughout the procedure under my direct supervision. COMPLICATIONS: None immediate. TECHNIQUE: Informed written consent was obtained from the patient after a discussion of the risks, benefits and alternatives to treatment. Questions regarding the procedure were encouraged and answered. Initial ultrasound scanning demonstrated an enlarged approximately 1.7 x 3.4 cm left inguinal lymph node which correlates with the dominant lymph node seen on prior CT of the abdomen pelvis (image 78, series 2). An ultrasound image was saved for documentation purposes. The procedure was planned. A timeout was performed prior to the initiation of the procedure. The operative was prepped and draped in the usual sterile fashion, and a sterile drape was applied covering the operative field. A timeout was performed prior to the initiation of the procedure. Local anesthesia was provided with 1% lidocaine with epinephrine. Under direct ultrasound guidance, an 18 gauge core needle device was utilized to obtain to obtain  5 core needle biopsied of the dominant left inguinal lymph node. The samples were placed in saline and submitted to pathology. The needle was removed and hemostasis was achieved with manual compression. Post procedure scan was negative for significant hematoma. A dressing was placed. The patient tolerated the procedure well without immediate postprocedural complication. IMPRESSION: Technically successful ultrasound guided left inguinal lymph node biopsy. Electronically Signed   By: Sandi Mariscal M.D.   On: 08/03/2015 17:53        Scheduled Meds: . enoxaparin (LOVENOX) injection  40 mg Subcutaneous Q24H  . lactose free nutrition  237 mL Oral TID WC  . lactose free nutrition  237 mL Oral TID BM  . magnesium oxide  400 mg Oral Daily  . pantoprazole (PROTONIX) IV  40 mg Intravenous Q24H  . piperacillin-tazobactam (ZOSYN)  IV  3.375 g  Intravenous Q8H  . potassium chloride  30 mEq Oral Q4H  . 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    Vernell Leep, MD, FACP, FHM. [facility] Pager [phone]  If 7PM-7AM, please contact night-coverage [email or web address] Password [email or web address] 08/04/2015, 12:46 PM    LOS: 3 days

## 2015-08-04 NOTE — Progress Notes (Signed)
Subjective: Some nausea, would like to eat this Am, but waiting to see if it is OK with everyone.  I have ask them to call radiology and check.  She is going to get the BE today.    Objective: Vital signs in last 24 hours: Temp:  [98.4 F (36.9 C)-99 F (37.2 C)] 98.4 F (36.9 C) (03/09 0445) Pulse Rate:  [85-95] 90 (03/09 0445) Resp:  [10-16] 16 (03/09 0445) BP: (106-138)/(58-72) 109/58 mmHg (03/09 0445) SpO2:  [99 %-100 %] 100 % (03/09 0445) Last BM Date: 08/03/15 Voided x 13 Stools x 10 Afebrile, VSS K+ 3.2,  Normal cbc today, H/H coming down S/p lymph node bx yesterday Intake/Output from previous day:   Intake/Output this shift:    General appearance: alert, cooperative and no distress GI: soft, less tender this AM.    Lab Results:   Recent Labs  08/03/15 0814 08/04/15 0333  WBC 11.9* 9.0  HGB 9.5* 8.8*  HCT 30.3* 27.5*  PLT 506* 525*    BMET  Recent Labs  08/03/15 0814 08/04/15 0333  NA 133* 138  K 3.4* 3.2*  CL 99* 102  CO2 25 26  GLUCOSE 92 91  BUN 11 10  CREATININE 0.79 0.86  CALCIUM 8.0* 8.1*   PT/INR  Recent Labs  08/01/15 2209  LABPROT 15.3*  INR 1.19     Recent Labs Lab 08/01/15 1020  AST 20  ALT 24  ALKPHOS 86  BILITOT <0.30  PROT 7.5  ALBUMIN 2.2*     Lipase  No results found for: LIPASE   Studies/Results: US Biopsy  08/03/2015  INDICATION: History of cervical cancer, now with bilateral inguinal lymphadenopathy. Previous from ultrasound-guided biopsy for tissue diagnostic purposes. EXAM: ULTRASOUND BIOPSY CORE LIVER COMPARISON:  CT abdomen pelvis -08/01/2015 MEDICATIONS: None ANESTHESIA/SEDATION: Moderate (conscious) sedation was employed during this procedure. A total of Versed 1.5 mg and Fentanyl 75 mcg was administered intravenously. Moderate Sedation Time: 10 minutes. The patient's level of consciousness and vital signs were monitored continuously by radiology nursing throughout the procedure under my direct  supervision. COMPLICATIONS: None immediate. TECHNIQUE: Informed written consent was obtained from the patient after a discussion of the risks, benefits and alternatives to treatment. Questions regarding the procedure were encouraged and answered. Initial ultrasound scanning demonstrated an enlarged approximately 1.7 x 3.4 cm left inguinal lymph node which correlates with the dominant lymph node seen on prior CT of the abdomen pelvis (image 78, series 2). An ultrasound image was saved for documentation purposes. The procedure was planned. A timeout was performed prior to the initiation of the procedure. The operative was prepped and draped in the usual sterile fashion, and a sterile drape was applied covering the operative field. A timeout was performed prior to the initiation of the procedure. Local anesthesia was provided with 1% lidocaine with epinephrine. Under direct ultrasound guidance, an 18 gauge core needle device was utilized to obtain to obtain 5 core needle biopsied of the dominant left inguinal lymph node. The samples were placed in saline and submitted to pathology. The needle was removed and hemostasis was achieved with manual compression. Post procedure scan was negative for significant hematoma. A dressing was placed. The patient tolerated the procedure well without immediate postprocedural complication. IMPRESSION: Technically successful ultrasound guided left inguinal lymph node biopsy. Electronically Signed   By: Sandi Mariscal M.D.   On: 08/03/2015 17:53    Medications: . enoxaparin (LOVENOX) injection  40 mg Subcutaneous Q24H  . lactose free nutrition  237  mL Oral TID WC  . lactose free nutrition  237 mL Oral TID BM  . magnesium oxide  400 mg Oral Daily  . pantoprazole (PROTONIX) IV  40 mg Intravenous Q24H  . piperacillin-tazobactam (ZOSYN)  IV  3.375 g Intravenous Q8H  . potassium chloride  30 mEq Oral Q4H  . traMADol  50 mg Oral 4 times per day    Assessment/Plan Recurrent cervical  cancer with metastasis  New rectovaginal fistula Known vesicovaginal fistula Bilateral ureteral strictures with bilateral stents, [name] Chemotherapy and radiation therapy (last treatment in Dec 2016) Sepsis/UTI Anemia - being transfused now Malnutrition 10 lbs last month, and 54 pounds last year. Antibiotics: day 2 Ceftriaxone and Flagyl; converted to Zosyn Now day 2 (antibiotics 4 days total) DVT: Lovenox/SCD   Plan:  BE today. I just put the order in so there may be a prep involved.       LOS: 3 days    Stacy Webster 08/04/2015

## 2015-08-04 NOTE — Care Management Note (Signed)
Case Management Note  Patient Details  Name: Stacy Webster MRN: [id number] Date of Birth: [birth year]  Subjective/Objective:      55 yo admitted with Sepsis.               Action/Plan: From home with family.  Expected Discharge Date:   (unknown)               Expected Discharge Plan:  Home/Self Care  In-House Referral:     Discharge planning Services  CM Consult  Post Acute Care Choice:    Choice offered to:     DME Arranged:    DME Agency:     HH Arranged:    HH Agency:     Status of Service:  In process, will continue to follow  Medicare Important Message Given:    Date Medicare IM Given:    Medicare IM give by:    Date Additional Medicare IM Given:    Additional Medicare Important Message give by:     If discussed at [place] of Stay Meetings, dates discussed:    Additional Comments: Chart reviewed and no CM needs identified or communicated at this time. CM will continue to follow. Marney Doctor RN,BSN,NCM [id number] Lynnell Catalan, RN 08/04/2015, 1:52 PM

## 2015-08-04 NOTE — Progress Notes (Signed)
MEDICAL ONCOLOGY August 04, 2015, 8:25 AM  Hospital day 4 Antibiotics: zosyn  Chemotherapy: last CDDP taxol avastin 05-05-15  Outpatient Physicians: Stacy Cookey, MD, Stacy Webster, Stacy Webster, Stacy Del), Stacy Webster, Stacy Webster. Stacy Webster    EMR reviewed. Patient seen with husband here For barium enema today  Subjective: More nausea in last 24 hrs, phenergan lasting ~ 2 hrs. No change in feces per vagina. Denies SOB at rest. NPO for barium enema.  No shaking chills. No overt bleeding. Tolerated IV iron and PRBCs 3-7 without incident.   Objective: Vital signs in last 24 hours: Blood pressure 109/58, pulse 90, temperature 98.4 F (36.9 C), temperature source Oral, resp. rate 16, height 5\' 4"  (1.626 m), weight 136 lb (61.689 kg), last menstrual period 11/05/2011, SpO2 100 %.  Physical exam: looks generally comfortable supine in bed on RA. Oral mucosa moist. PERRL. Peripheral IV sites ok. Heart RRR. Lungs clear ant/ lat. Abdomen soft, mildly distended, quiet, not tender. LE no pitting edema, cords, tenderness. Moves extremities easily.   Lab Results:  Recent Labs  08/03/15 0814 08/04/15 0333  WBC 11.9* 9.0  HGB 9.5* 8.8*  HCT 30.3* 27.5*  PLT 506* 525*   BMET  Recent Labs  08/03/15 0814 08/04/15 0333  NA 133* 138  K 3.4* 3.2*  CL 99* 102  CO2 25 26  GLUCOSE 92 91  BUN 11 10  CREATININE 0.79 0.86  CALCIUM 8.0* 8.1*   Blood cultures from admission negative to date   PATHOLOGY pending From aspiration of left inguinal LN 08-03-15  Studies/Results: US Biopsy  08/03/2015  INDICATION: History of cervical cancer, now with bilateral inguinal lymphadenopathy. Previous from ultrasound-guided biopsy for tissue diagnostic purposes. EXAM: ULTRASOUND BIOPSY CORE LIVER COMPARISON:  CT abdomen pelvis -08/01/2015 MEDICATIONS: None ANESTHESIA/SEDATION: Moderate (conscious) sedation was employed during this procedure. A total of Versed 1.5 mg and Fentanyl  75 mcg was administered intravenously. Moderate Sedation Time: 10 minutes. The patient's level of consciousness and vital signs were monitored continuously by radiology nursing throughout the procedure under my direct supervision. COMPLICATIONS: None immediate. TECHNIQUE: Informed written consent was obtained from the patient after a discussion of the risks, benefits and alternatives to treatment. Questions regarding the procedure were encouraged and answered. Initial ultrasound scanning demonstrated an enlarged approximately 1.7 x 3.4 cm left inguinal lymph node which correlates with the dominant lymph node seen on prior CT of the abdomen pelvis (image 78, series 2). An ultrasound image was saved for documentation purposes. The procedure was planned. A timeout was performed prior to the initiation of the procedure. The operative was prepped and draped in the usual sterile fashion, and a sterile drape was applied covering the operative field. A timeout was performed prior to the initiation of the procedure. Local anesthesia was provided with 1% lidocaine with epinephrine. Under direct ultrasound guidance, an 18 gauge core needle device was utilized to obtain to obtain 5 core needle biopsied of the dominant left inguinal lymph node. The samples were placed in saline and submitted to pathology. The needle was removed and hemostasis was achieved with manual compression. Post procedure scan was negative for significant hematoma. A dressing was placed. The patient tolerated the procedure well without immediate postprocedural complication. IMPRESSION: Technically successful ultrasound guided left inguinal lymph node biopsy. Electronically Signed   By: Sandi Mariscal M.D.   On: 08/03/2015 17:53    Medications reviewed: had irritation of peripheral IVs with zofran x2 at [facility], not used since then.  Assessment/Plan: 1.recurrent/ progressive adenocarcinoma of cervix: clinical IIB at diagnosis 04-2014 treated  initially with radiation and sensitizing cisplatin, subsequent metastatic disease to pelvis and retroperitoneum, with right hydronephrosis. Treated with 7 cycles of CDDP taxol avastin thru 05-05-15, held since then with necrotic tissues in pelvis and initial vesicovaginal fistula, now clinically colovaginal/ vesical fistula. Cytology from left inguinal LN pending. For Ba enema today, plan diverting colostomy vs ileostomy when stable.  2. 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 Dr Junious Silk involved. She is out 3 months from last avastin, so OK for procedure such as diverting nephrostomy tubes if recommended  3.colovaginal/ urethral fistula: likely diverting colostomy vs ileostomy. Note is out 3 months from last avastin so ok for surgical procedure from that standpoint. 4.progressive weight loss, poor po intake, protein calorie malnutrition: would push po including supplements in preference to parenteral nutrition. 5.difficult peripheral IV access, has not wanted central catheter 6.iron deficiency related to ongoing blood loss from stents, blood draws and noncompliance with oral iron. Had first dose feraheme  + 1 unit PRBCs on 08-03-15.  7.history of urosepsis and C diff in 2016 8.refused flu vaccine 9.history of asthma 10.chemo peripheral neuropathy feet > hands, unchanged 11.presumed intolerance to zofran, with IV irritation    LIVESAY,Stacy Webster Pager [phone]

## 2015-08-05 ENCOUNTER — Inpatient Hospital Stay (HOSPITAL_COMMUNITY): Payer: BC Managed Care – PPO | Admitting: Certified Registered Nurse Anesthetist

## 2015-08-05 ENCOUNTER — Encounter (HOSPITAL_COMMUNITY): Payer: Self-pay | Admitting: Certified Registered Nurse Anesthetist

## 2015-08-05 ENCOUNTER — Encounter (HOSPITAL_COMMUNITY)
Admission: EM | Disposition: A | Discharge status: Home Health | Payer: Self-pay | Source: Home / Self Care | Attending: Internal Medicine

## 2015-08-05 DIAGNOSIS — Z933 Colostomy status: Secondary | ICD-10-CM

## 2015-08-05 HISTORY — PX: LAPAROTOMY: SHX154

## 2015-08-05 LAB — CBC
HCT: 26.2 % — ABNORMAL LOW (ref 36.0–46.0)
HEMOGLOBIN: 8.4 g/dL — AB (ref 12.0–15.0)
MCH: 28 pg (ref 26.0–34.0)
MCHC: 32.1 g/dL (ref 30.0–36.0)
MCV: 87.3 fL (ref 78.0–100.0)
PLATELETS: 520 10*3/uL — AB (ref 150–400)
RBC: 3 MIL/uL — AB (ref 3.87–5.11)
RDW: 18.2 % — ABNORMAL HIGH (ref 11.5–15.5)
WBC: 8.8 10*3/uL (ref 4.0–10.5)

## 2015-08-05 LAB — BASIC METABOLIC PANEL
ANION GAP: 5 (ref 5–15)
BUN: 8 mg/dL (ref 6–20)
CALCIUM: 7.6 mg/dL — AB (ref 8.9–10.3)
CHLORIDE: 100 mmol/L — AB (ref 101–111)
CO2: 26 mmol/L (ref 22–32)
CREATININE: 1.01 mg/dL — AB (ref 0.44–1.00)
GFR calc non Af Amer: >60 mL/min (ref >60)
Glucose, Bld: 90 mg/dL (ref 65–99)
Potassium: 4.1 mmol/L (ref 3.5–5.1)
SODIUM: 138 mmol/L (ref 135–145)

## 2015-08-05 LAB — TYPE AND SCREEN
ABO/RH(D): A POS
Antibody Screen: NEGATIVE

## 2015-08-05 SURGERY — LAPAROTOMY, EXPLORATORY
Anesthesia: General

## 2015-08-05 MED ORDER — FENTANYL CITRATE (PF) 250 MCG/5ML IJ SOLN
INTRAMUSCULAR | Status: AC
  Filled 2015-08-05: qty 5

## 2015-08-05 MED ORDER — SUGAMMADEX SODIUM 200 MG/2ML IV SOLN
INTRAVENOUS | Status: AC
  Filled 2015-08-05: qty 2

## 2015-08-05 MED ORDER — PHENYLEPHRINE HCL 10 MG/ML IJ SOLN
INTRAMUSCULAR | Status: DC | PRN
  Administered 2015-08-05 (×3): 80 ug via INTRAVENOUS

## 2015-08-05 MED ORDER — DEXAMETHASONE SODIUM PHOSPHATE 10 MG/ML IJ SOLN
INTRAMUSCULAR | Status: AC
  Filled 2015-08-05: qty 1

## 2015-08-05 MED ORDER — SUGAMMADEX SODIUM 200 MG/2ML IV SOLN
INTRAVENOUS | Status: DC | PRN
  Administered 2015-08-05: 140 mg via INTRAVENOUS

## 2015-08-05 MED ORDER — HYDROMORPHONE HCL 1 MG/ML IJ SOLN: 0.2500 mg | INTRAMUSCULAR | Status: DC | PRN

## 2015-08-05 MED ORDER — HYDROMORPHONE HCL 2 MG/ML IJ SOLN
INTRAMUSCULAR | Status: AC
  Filled 2015-08-05: qty 1

## 2015-08-05 MED ORDER — DEXAMETHASONE SODIUM PHOSPHATE 4 MG/ML IJ SOLN
INTRAMUSCULAR | Status: DC | PRN
  Administered 2015-08-05: 10 mg via INTRAVENOUS

## 2015-08-05 MED ORDER — LIDOCAINE HCL (CARDIAC) 20 MG/ML IV SOLN
INTRAVENOUS | Status: DC | PRN
  Administered 2015-08-05: 50 mg via INTRAVENOUS

## 2015-08-05 MED ORDER — BUPIVACAINE-EPINEPHRINE (PF) 0.25% -1:200000 IJ SOLN
INTRAMUSCULAR | Status: AC
  Filled 2015-08-05: qty 30

## 2015-08-05 MED ORDER — METOCLOPRAMIDE HCL 5 MG/ML IJ SOLN
INTRAMUSCULAR | Status: AC
  Filled 2015-08-05: qty 2

## 2015-08-05 MED ORDER — LIDOCAINE HCL 1 % IJ SOLN
INTRAMUSCULAR | Status: DC | PRN
  Administered 2015-08-05: 9 mL

## 2015-08-05 MED ORDER — ROCURONIUM BROMIDE 100 MG/10ML IV SOLN
INTRAVENOUS | Status: AC
  Filled 2015-08-05: qty 1

## 2015-08-05 MED ORDER — LIDOCAINE HCL (CARDIAC) 20 MG/ML IV SOLN
INTRAVENOUS | Status: AC
  Filled 2015-08-05: qty 5

## 2015-08-05 MED ORDER — POTASSIUM CHLORIDE IN NACL 20-0.9 MEQ/L-% IV SOLN
INTRAVENOUS | Status: AC
  Filled 2015-08-05: qty 1000

## 2015-08-05 MED ORDER — FENTANYL CITRATE (PF) 100 MCG/2ML IJ SOLN
INTRAMUSCULAR | Status: DC | PRN
  Administered 2015-08-05 (×5): 50 ug via INTRAVENOUS

## 2015-08-05 MED ORDER — ENOXAPARIN SODIUM 40 MG/0.4ML ~~LOC~~ SOLN
40.0000 mg | SUBCUTANEOUS | Status: DC
  Administered 2015-08-06 – 2015-08-08 (×3): 40 mg via SUBCUTANEOUS
  Filled 2015-08-05 (×4): qty 0.4

## 2015-08-05 MED ORDER — PROPOFOL 10 MG/ML IV BOLUS
INTRAVENOUS | Status: DC | PRN
  Administered 2015-08-05: 150 mg via INTRAVENOUS
  Administered 2015-08-05: 30 mg via INTRAVENOUS

## 2015-08-05 MED ORDER — METOCLOPRAMIDE HCL 5 MG/ML IJ SOLN
INTRAMUSCULAR | Status: DC | PRN
  Administered 2015-08-05: 10 mg via INTRAVENOUS

## 2015-08-05 MED ORDER — MEPERIDINE HCL 50 MG/ML IJ SOLN: 6.2500 mg | INTRAMUSCULAR | Status: DC | PRN

## 2015-08-05 MED ORDER — MIDAZOLAM HCL 2 MG/2ML IJ SOLN
INTRAMUSCULAR | Status: AC
  Filled 2015-08-05: qty 2

## 2015-08-05 MED ORDER — PROMETHAZINE HCL 25 MG/ML IJ SOLN: 6.2500 mg | INTRAMUSCULAR | Status: DC | PRN

## 2015-08-05 MED ORDER — BUPIVACAINE-EPINEPHRINE 0.25% -1:200000 IJ SOLN
INTRAMUSCULAR | Status: DC | PRN
  Administered 2015-08-05: 9 mL

## 2015-08-05 MED ORDER — ROCURONIUM BROMIDE 100 MG/10ML IV SOLN
INTRAVENOUS | Status: DC | PRN
  Administered 2015-08-05: 20 mg via INTRAVENOUS
  Administered 2015-08-05: 50 mg via INTRAVENOUS

## 2015-08-05 MED ORDER — LIDOCAINE HCL 1 % IJ SOLN
INTRAMUSCULAR | Status: AC
  Filled 2015-08-05: qty 20

## 2015-08-05 MED ORDER — LACTATED RINGERS IV SOLN
INTRAVENOUS | Status: DC | PRN
  Administered 2015-08-05 (×2): via INTRAVENOUS

## 2015-08-05 MED ORDER — METHOCARBAMOL 500 MG PO TABS: 1000.0000 mg | ORAL_TABLET | Freq: Three times a day (TID) | ORAL | Status: DC | PRN

## 2015-08-05 MED ORDER — MIDAZOLAM HCL 5 MG/5ML IJ SOLN
INTRAMUSCULAR | Status: DC | PRN
  Administered 2015-08-05: 2 mg via INTRAVENOUS

## 2015-08-05 MED ORDER — POTASSIUM CHLORIDE IN NACL 20-0.9 MEQ/L-% IV SOLN
INTRAVENOUS | Status: DC
  Administered 2015-08-05 – 2015-08-07 (×3): via INTRAVENOUS
  Filled 2015-08-05 (×6): qty 1000

## 2015-08-05 MED ORDER — PROPOFOL 10 MG/ML IV BOLUS
INTRAVENOUS | Status: AC
  Filled 2015-08-05: qty 20

## 2015-08-05 MED ORDER — HYDROMORPHONE HCL 1 MG/ML IJ SOLN
INTRAMUSCULAR | Status: DC | PRN
  Administered 2015-08-05: 0.5 mg via INTRAVENOUS

## 2015-08-05 MED ORDER — PHENYLEPHRINE 40 MCG/ML (10ML) SYRINGE FOR IV PUSH (FOR BLOOD PRESSURE SUPPORT)
PREFILLED_SYRINGE | INTRAVENOUS | Status: AC
  Filled 2015-08-05: qty 10

## 2015-08-05 MED ORDER — 0.9 % SODIUM CHLORIDE (POUR BTL) OPTIME
TOPICAL | Status: DC | PRN
  Administered 2015-08-05: 1000 mL

## 2015-08-05 SURGICAL SUPPLY — 78 items
APPLIER CLIP 5 13 M/L LIGAMAX5 (MISCELLANEOUS)
APPLIER CLIP ROT 10 11.4 M/L (STAPLE)
BARRIER SKIN 2 1/4 (WOUND CARE) ×2 IMPLANT
BLADE EXTENDED COATED 6.5IN (ELECTRODE) ×2 IMPLANT
BLADE HEX COATED 2.75 (ELECTRODE) ×4 IMPLANT
CABLE HIGH FREQUENCY MONO STRZ (ELECTRODE) IMPLANT
CATH KIT ON-Q SILVERSOAK 7.5IN (CATHETERS) IMPLANT
CELLS DAT CNTRL 66122 CELL SVR (MISCELLANEOUS) IMPLANT
CLIP APPLIE 5 13 M/L LIGAMAX5 (MISCELLANEOUS) IMPLANT
CLIP APPLIE ROT 10 11.4 M/L (STAPLE) IMPLANT
COUNTER NEEDLE 20 DBL MAG RED (NEEDLE) IMPLANT
COVER SURGICAL LIGHT HANDLE (MISCELLANEOUS) IMPLANT
DECANTER SPIKE VIAL GLASS SM (MISCELLANEOUS) ×2 IMPLANT
DRAIN CHANNEL 19F RND (DRAIN) IMPLANT
DRAPE LAPAROSCOPIC ABDOMINAL (DRAPES) ×2 IMPLANT
DRAPE UTILITY XL STRL (DRAPES) ×2 IMPLANT
DRSG OPSITE POSTOP 4X10 (GAUZE/BANDAGES/DRESSINGS) IMPLANT
DRSG OPSITE POSTOP 4X6 (GAUZE/BANDAGES/DRESSINGS) IMPLANT
DRSG OPSITE POSTOP 4X8 (GAUZE/BANDAGES/DRESSINGS) IMPLANT
DRSG TEGADERM 4X4.75 (GAUZE/BANDAGES/DRESSINGS) IMPLANT
ELECT REM PT RETURN 9FT ADLT (ELECTROSURGICAL) ×2
ELECTRODE REM PT RTRN 9FT ADLT (ELECTROSURGICAL) ×1 IMPLANT
ENDOLOOP SUT PDS II  0 18 (SUTURE)
ENDOLOOP SUT PDS II 0 18 (SUTURE) IMPLANT
GAUZE SPONGE 4X4 12PLY STRL (GAUZE/BANDAGES/DRESSINGS) IMPLANT
GLOVE BIO SURGEON STRL SZ 6 (GLOVE) ×4 IMPLANT
GLOVE INDICATOR 6.5 STRL GRN (GLOVE) ×4 IMPLANT
GOWN STRL REUS W/ TWL XL LVL3 (GOWN DISPOSABLE) ×3 IMPLANT
GOWN STRL REUS W/TWL XL LVL3 (GOWN DISPOSABLE) ×3
HANDLE STAPLE EGIA 4 XL (STAPLE) ×2 IMPLANT
LEGGING LITHOTOMY PAIR STRL (DRAPES) IMPLANT
LIQUID BAND (GAUZE/BANDAGES/DRESSINGS) ×2 IMPLANT
LUBRICANT JELLY K Y 4OZ (MISCELLANEOUS) IMPLANT
PACK COLON (CUSTOM PROCEDURE TRAY) ×2 IMPLANT
PAD POSITIONING PINK XL (MISCELLANEOUS) ×2 IMPLANT
POSITIONER SURGICAL ARM (MISCELLANEOUS) IMPLANT
POUCH OSTOMY 2 PC DRNBL 2.25 (WOUND CARE) ×1 IMPLANT
POUCH OSTOMY DRNBL 2 1/4 (WOUND CARE) ×1
RELOAD EGIA 60 MED/THCK PURPLE (STAPLE) ×2 IMPLANT
RTRCTR WOUND ALEXIS 18CM MED (MISCELLANEOUS)
SCISSORS LAP 5X35 DISP (ENDOMECHANICALS) ×2 IMPLANT
SEALER TISSUE G2 STRG ARTC 35C (ENDOMECHANICALS) ×2 IMPLANT
SET IRRIG TUBING LAPAROSCOPIC (IRRIGATION / IRRIGATOR) ×2 IMPLANT
SHEARS HARMONIC ACE PLUS 36CM (ENDOMECHANICALS) IMPLANT
SLEEVE SURGEON STRL (DRAPES) IMPLANT
SLEEVE XCEL OPT CAN 5 100 (ENDOMECHANICALS) ×4 IMPLANT
SOLUTION ANTI FOG 6CC (MISCELLANEOUS) IMPLANT
STAPLER VISISTAT 35W (STAPLE) IMPLANT
SUT MNCRL AB 4-0 PS2 18 (SUTURE) ×4 IMPLANT
SUT PDS AB 1 CTX 36 (SUTURE) IMPLANT
SUT PDS AB 1 TP1 96 (SUTURE) IMPLANT
SUT PROLENE 2 0 KS (SUTURE) IMPLANT
SUT SILK 2 0 (SUTURE)
SUT SILK 2 0 SH CR/8 (SUTURE) IMPLANT
SUT SILK 2-0 18XBRD TIE 12 (SUTURE) IMPLANT
SUT SILK 3 0 (SUTURE)
SUT SILK 3 0 SH CR/8 (SUTURE) IMPLANT
SUT SILK 3-0 18XBRD TIE 12 (SUTURE) IMPLANT
SUT VIC AB 2-0 SH 18 (SUTURE) IMPLANT
SUT VIC AB 3-0 SH 18 (SUTURE) IMPLANT
SUT VIC AB 3-0 SH 8-18 (SUTURE) ×4 IMPLANT
SUT VICRYL 2 0 18  UND BR (SUTURE)
SUT VICRYL 2 0 18 UND BR (SUTURE) IMPLANT
SUT VICRYL 3 0 BR 18  UND (SUTURE)
SUT VICRYL 3 0 BR 18 UND (SUTURE) IMPLANT
SYS LAPSCP GELPORT 120MM (MISCELLANEOUS)
SYSTEM LAPSCP GELPORT 120MM (MISCELLANEOUS) IMPLANT
TAPE CLOTH 4X10 WHT NS (GAUZE/BANDAGES/DRESSINGS) IMPLANT
TOWEL OR NON WOVEN STRL DISP B (DISPOSABLE) ×2 IMPLANT
TRAY FOLEY W/METER SILVER 14FR (SET/KITS/TRAYS/PACK) ×2 IMPLANT
TRAY FOLEY W/METER SILVER 16FR (SET/KITS/TRAYS/PACK) IMPLANT
TROCAR BLADELESS OPT 5 100 (ENDOMECHANICALS) ×2 IMPLANT
TROCAR XCEL 12X100 BLDLESS (ENDOMECHANICALS) ×2 IMPLANT
TROCAR XCEL BLUNT TIP 100MML (ENDOMECHANICALS) IMPLANT
TROCAR XCEL NON-BLD 11X100MML (ENDOMECHANICALS) IMPLANT
TUBING CONNECTING 10 (TUBING) IMPLANT
TUBING INSUF HEATED (TUBING) ×2 IMPLANT
TUNNELER SHEATH ON-Q 16GX12 DP (PAIN MANAGEMENT) IMPLANT

## 2015-08-05 NOTE — Progress Notes (Signed)
Day of Surgery  Subjective: Having a lot of incontinence.  Objective: Vital signs in last 24 hours: Temp:  [98.6 F (37 C)-99.1 F (37.3 C)] 98.6 F (37 C) (03/10 1040) Pulse Rate:  [81-87] 87 (03/10 1040) Resp:  [16] 16 (03/10 1040) BP: (118-138)/(60-62) 138/61 mmHg (03/10 1040) SpO2:  [98 %-100 %] 100 % (03/10 1040) Last BM Date: 08/04/15 Voided x 13 Stools x 10 Afebrile, VSS K+ 3.2,  Normal cbc today, H/H coming down S/p lymph node bx yesterday Intake/Output from previous day: 03/09 0701 - 03/10 0700 In: 1282.5 [P.O.:595; I.V.:437.5; IV Piggyback:250] Out: -  Intake/Output this shift: Total I/O In: 515.8 [I.V.:465.8; IV Piggyback:50] Out: -   General appearance: alert, cooperative and no distress GI: soft, less tender this AM.    Lab Results:   Recent Labs  08/04/15 0333 08/05/15 0320  WBC 9.0 8.8  HGB 8.8* 8.4*  HCT 27.5* 26.2*  PLT 525* 520*    BMET  Recent Labs  08/04/15 0333 08/05/15 0320  NA 138 138  K 3.2* 4.1  CL 102 100*  CO2 26 26  GLUCOSE 91 90  BUN 10 8  CREATININE 0.86 1.01*  CALCIUM 8.1* 7.6*   PT/INR No results for input(s): LABPROT, INR in the last 72 hours.   Recent Labs Lab 08/01/15 1020  AST 20  ALT 24  ALKPHOS 86  BILITOT <0.30  PROT 7.5  ALBUMIN 2.2*     Lipase  No results found for: LIPASE   Studies/Results: US Biopsy  08/03/2015  INDICATION: History of cervical cancer, now with bilateral inguinal lymphadenopathy. Previous from ultrasound-guided biopsy for tissue diagnostic purposes. EXAM: ULTRASOUND BIOPSY CORE LIVER COMPARISON:  CT abdomen pelvis -08/01/2015 MEDICATIONS: None ANESTHESIA/SEDATION: Moderate (conscious) sedation was employed during this procedure. A total of Versed 1.5 mg and Fentanyl 75 mcg was administered intravenously. Moderate Sedation Time: 10 minutes. The patient's level of consciousness and vital signs were monitored continuously by radiology nursing throughout the procedure under my direct  supervision. COMPLICATIONS: None immediate. TECHNIQUE: Informed written consent was obtained from the patient after a discussion of the risks, benefits and alternatives to treatment. Questions regarding the procedure were encouraged and answered. Initial ultrasound scanning demonstrated an enlarged approximately 1.7 x 3.4 cm left inguinal lymph node which correlates with the dominant lymph node seen on prior CT of the abdomen pelvis (image 78, series 2). An ultrasound image was saved for documentation purposes. The procedure was planned. A timeout was performed prior to the initiation of the procedure. The operative was prepped and draped in the usual sterile fashion, and a sterile drape was applied covering the operative field. A timeout was performed prior to the initiation of the procedure. Local anesthesia was provided with 1% lidocaine with epinephrine. Under direct ultrasound guidance, an 18 gauge core needle device was utilized to obtain to obtain 5 core needle biopsied of the dominant left inguinal lymph node. The samples were placed in saline and submitted to pathology. The needle was removed and hemostasis was achieved with manual compression. Post procedure scan was negative for significant hematoma. A dressing was placed. The patient tolerated the procedure well without immediate postprocedural complication. IMPRESSION: Technically successful ultrasound guided left inguinal lymph node biopsy. Electronically Signed   By: Sandi Mariscal M.D.   On: 08/03/2015 17:53   Dg Colon W/water Sol Cm  08/04/2015  CLINICAL DATA:  History a cervical cancer. Evaluate for rectovaginal an and recto vesicle fistula EXAM: COLON WITH WATER SOLUTION CONTRAST COMPARISON:  CT from 08/01/2015. FINDINGS: On the scout radiograph bilateral nephro ureteral stents are identified. Enteric contrast material is identified within the colon up to the mid descending colon. Absence of contrast from within the distal colon, rectum, and vagina  is noted. Enema tip was placed into the rectum. This when directly through a large defect within the ventral wall of the rectum into the vagina. Under gravity water-soluble contrast material was infused via the enema tube. Initially, this opacified the vagina. Reflux into the rectum via the rectovaginal fistula was identified as well as opacification of the urinary bladder via a fistula between the anterior wall of the vagina and the posterior wall of bladder. IMPRESSION: 1. 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 2. These results were called by telephone at the time of interpretation on 08/04/2015 at 3:45 pm to Dr. Barry Dienes , who verbally acknowledged these results. Electronically Signed   By: Kerby Moors M.D.   On: 08/04/2015 15:46    Medications: . [MAR Hold] lactose free nutrition  237 mL Oral TID WC  . [MAR Hold] lactose free nutrition  237 mL Oral TID BM  . [MAR Hold] magnesium oxide  400 mg Oral Daily  . [MAR Hold] pantoprazole (PROTONIX) IV  40 mg Intravenous Q24H  . [MAR Hold] piperacillin-tazobactam (ZOSYN)  IV  3.375 g Intravenous Q8H  . 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 Chemotherapy and radiation therapy (last treatment in Dec 2016) Sepsis/UTI Anemia - being transfused now Malnutrition 10 lbs last month, and 54 pounds last year. Antibiotics: day 2 Ceftriaxone and Flagyl; converted to Zosyn Now day 2 (antibiotics 4 days total) DVT: Lovenox/SCD  To OR today for diverting ostomy     LOS: 4 days    [facility] 08/05/2015

## 2015-08-05 NOTE — Consult Note (Signed)
WOC ostomy consult note Presurgical marking for both colostomy and Ileostomy.  Written material is provided.  Sister at bedside.  Explained the difference between the two diversions and that education would resume postoperatively on self care.  Patient is understanding of this and appreciative.  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  Abdominal plane is assessed sitting, lying and standing.  Creasing at umbilicus, otherwise no other factors affecting stomal site marking.  Sites are marked in LLQ and RLQ and patient understands the rationale for both.  She is able to visualize these sites easily. LLQ 3 cm left and 3 cm below umbilicus RLQ 3 cm right and 3 cm below umbilicus Both are within the rectus muscle and the patient's field of vision.   [place] team will see again on Monday for pouch change and to instruct on self care.  Domenic Moras RN BSN [place] Pager [phone]

## 2015-08-05 NOTE — Anesthesia Preprocedure Evaluation (Signed)
Anesthesia Evaluation  Patient identified by MRN, date of birth, ID band Patient awake    Reviewed: Allergy & Precautions, NPO status , Patient's Chart, lab work & pertinent test results  History of Anesthesia Complications (+) PONV and history of anesthetic complications  Airway Mallampati: II  TM Distance: >3 FB Neck ROM: Full    Dental no notable dental hx.    Pulmonary asthma ,    Pulmonary exam normal breath sounds clear to auscultation       Cardiovascular negative cardio ROS Normal cardiovascular exam Rhythm:Regular Rate:Normal     Neuro/Psych negative neurological ROS  negative psych ROS   GI/Hepatic Neg liver ROS, GERD  Medicated,  Endo/Other  negative endocrine ROS  Renal/GU Renal disease     Musculoskeletal negative musculoskeletal ROS (+)   Abdominal   Peds  Hematology negative hematology ROS (+) anemia ,   Anesthesia Other Findings   Reproductive/Obstetrics negative OB ROS                             Anesthesia Physical  Anesthesia Plan  ASA: II  Anesthesia Plan: General   Post-op Pain Management:    Induction: Intravenous  Airway Management Planned: LMA  Additional Equipment:   Intra-op Plan:   Post-operative Plan: Extubation in OR  Informed Consent: I have reviewed the patients History and Physical, chart, labs and discussed the procedure including the risks, benefits and alternatives for the proposed anesthesia with the patient or authorized representative who has indicated his/her understanding and acceptance.   Dental advisory given  Plan Discussed with: CRNA  Anesthesia Plan Comments:         Anesthesia Quick Evaluation

## 2015-08-05 NOTE — Op Note (Signed)
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  FINDINGS:  Sigmoid colon plastered to pelvic sidewall and anterior abdominal wall.  NOT mobile.    EBL: min  PROCEDURE:  Patient was identified in the holding area and taken to the OR where she was placed supine on the OR table.  General anesthesia was induced.  Arms were tucked.  The abdomen was prepped and draped.  A timeout was performed according to the surgical safety checklist.  When all was correct, we continued.  The patient was placed into reverse trendelenburg position and rotated to the right.  The left subcostal margin was anesthetized with local anesthetic.  The 5 mm Optiview trocar was placed under direct visualization.  Pneumoperitoneum was achieved.  Two additional trocars were placed in the marked ostomy sites.  It was apparent that the sigmoid colon was immobile.  An additional right sided 5 mm trocar was placed.  The descending colon was taken down with the enseal.  The left sided trocar was then upsized to a 12 mm trocar.  The colon was divided at the junction of the descending and sigmoid colon.  Adequate adhesions were taken down to make sure the colon was mobile enough to reach the skin.  Once this was achieved, the stump was grasped and elevated toward the trocar.    Pneumoperitoneum was allowed to evacuate.  The left ostomy site was opened and the subcutaneous tissues were taken down with cautery.  A  cruciate incision was made on the fascia.  The [employer] retractors were used to stretch the ostomy site.  The colon was pulled up through the abdominal wall and it was made sure that adequate length was obtained.  This was brooked to the abdominal wall.  The other trocars were removed.  The ostomy site was then created by opening the colon stump.  3-0 interrupted vicryls were used to create the stoma.  This was digitalized to make sure it was patent.    The port sites were then closed with 4-0 monocryl subcuticular sutures.  The wounds were cleaned, dried, and dressed with dermabond.  The ostomy appliance was placed.  The patient was allowed to emerge from anesthesia and was taken to the PACU in stable condition.  Needle, sponge, and instrument counts were correct x 2.

## 2015-08-05 NOTE — Anesthesia Postprocedure Evaluation (Signed)
Anesthesia Post Note  Patient: Stacy Webster  Procedure(s) Performed: Procedure(s) (LRB): LAPAROSCOPY AND DIVERTING OSTOMY (N/A)  Patient location during evaluation: PACU Anesthesia Type: General Level of consciousness: sedated and patient cooperative Pain management: pain level controlled Vital Signs Assessment: post-procedure vital signs reviewed and stable Respiratory status: spontaneous breathing Cardiovascular status: stable Anesthetic complications: no    Last Vitals:  Filed Vitals:   08/05/15 1630 08/05/15 1645  BP: 136/75 147/67  Pulse: 88 87  Temp: 36.4 C 36.6 C  Resp: 12 12    Last Pain:  Filed Vitals:   08/05/15 1646  PainSc: 2                  Nolon Nations

## 2015-08-05 NOTE — Transfer of Care (Signed)
Immediate Anesthesia Transfer of Care Note  Patient: Stacy Webster  Procedure(s) Performed: Procedure(s): LAPAROSCOPY AND DIVERTING OSTOMY (N/A)  Patient Location: PACU  Anesthesia Type:General  Level of Consciousness: awake, alert , oriented and patient cooperative  Airway & Oxygen Therapy: Patient Spontanous Breathing and Patient connected to face mask oxygen  Post-op Assessment: Report given to RN, Post -op Vital signs reviewed and stable and Patient moving all extremities  Post vital signs: Reviewed and stable  Last Vitals:  Filed Vitals:   08/05/15 0535 08/05/15 1040  BP: 123/62 138/61  Pulse: 84 87  Temp: 37.1 C 37 C  Resp: 16 16    Complications: No apparent anesthesia complications

## 2015-08-05 NOTE — Progress Notes (Signed)
PROGRESS NOTE    NOTNAMED PAYNE  [id number]  DOB: [birth year]  DOA: 08/01/2015 PCP: [name], MD Outpatient Specialists:   Hospital course: 55 y/o female with hx of metastatic cervical cancer, bilateral ureteral strictures status post stents ([name]) presented with 3 day history of passing stool from her vagina. The patient has had low-grade fevers for the better part of one month up to 101.1F prior to admission. The patient presented to the cancer center and saw Drue Second on 08/01/2015. She was noted to have a fever up to 101.59F with tachycardia. As result, the patient was admitted for further evaluation. 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 The patient last received radiation April 2016. Patient declined percutaneous nephrostomies and urology signed off. Oncology/GYN oncology continue to follow. General surgery consulting and working up new rectovaginal fistula for possible divertion procedure 3/10.   Assessment & Plan:   Sepsis -Present at the time of admission--she presented with fever and tachycardia -Likely due to colitis/rectovaginal fistula/vaginitis - Lactic acid 1.0, Procalcitonin 0.23 - Blood cultures 2: Negative to date. Urine culture: Shows 50 K colonies of enterococcus-? Fecal contamination - Initially treated with IV fluids and empiric IV Rocephin and Flagyl. Due to persistent nausea and dry heaves, antibiotics changed to Zosyn on 3/8.  - Sepsis physiology resolved.  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 surgery consulted and evaluating for possible diverting colostomy. As per oncology note, 3 months out from last Avastin so okay for surgical procedure from that standpoint - Barium enema 3/9 confirmed rectovaginal and vesicovaginal fistula. Surgery plans diverting ostomy on 3/10. - Based on available data, patient is at low risk for perioperative CV events and may proceed with indicated surgery with usual close perioperative monitoring and management.  Vesicovaginal fistula/uterine fistula, ureteral stricture- status post bilateral ureteral stents 06/21/15, & ? UTI -Follows [name] -06/21/2015 fistula biopsy was benign -06/21/2015 Right ureteral stent change, new left ureteral stent - Urology input appreciated. Patient declined bilateral percutaneous nephrostomy.  Recurrent/progressive adenocarcinoma of the cervix -Drs. Bridget Hartshorn input appreciated -Last chemotherapy 05/05/2015  - Status post inguinal lymph node biopsy 3/8-results pending.  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 Hemoglobin 8 gradually dropping again over the last 48 hours to 8.4 today. Follow CBC in a.m. and transfuse if hemoglobin <8 g per DL.  Peripheral neuropathy  -Secondary to chemotherapy  -feet > hands, unchanged  Protein calorie malnutrition  -Nutrition consult  -Continue supplements   Hyponatremia/possible SIADH -Likely due to volume depletion and poor nutrition - Serum osmolarity: 269, urine osmolarity 444 -urine sodium, urine creatinine although not sure if will be accurate due to fistulae - Stable  Hypokalemia -Replace and follow. - Magnesium 1.8.  DVT prophylaxis: Lovenox Code Status: Full Family Communication: Discussed with  patient's sister at bedside on 3/10 Disposition Plan: DC home when medically stable.   Consultants:  Urology  General surgery  GYN oncology  Medical oncology  Interventional radiology  Procedures:  US guided biopsy of dominant left inguinal lymph node on 3/8 by IR.Marland Kitchen  Antimicrobials:  IV Rocephin 3/6 > 3/7  IV Flagyl 3/6 > 3/7  IV Zosyn 3/8 >   Subjective: Seen this morning prior to surgery. Denied abdominal distention or pain. Nausea and vomiting resolved. Continues to have stool incontinence. Denies history of HTN, DM, MI, CAD, CHF. Denies history of dyspnea, chest pain or palpitations. Physically active until December 2016.  Objective: Filed Vitals:   08/04/15 0445 08/04/15 1336 08/04/15 2050 08/05/15 0535  BP: 109/58 122/61 118/60 123/62  Pulse: 90 86 81 84  Temp: 98.4 F (36.9 C) 99.1 F (37.3 C) 98.6 F (37 C) 98.8 F (37.1 C)  TempSrc: Oral Oral Oral Oral  Resp: 16 16 16 16   Height:      Weight:      SpO2: 100% 98% 99% 100%    Intake/Output Summary (Last 24 hours) at 08/05/15 0720 Last data filed at 08/05/15 0704  Gross per 24 hour  Intake 1798.33 ml  Output      0 ml  Net 1798.33 ml   Filed Weights   08/01/15 1330  Weight: 61.689 kg (136 lb)    Exam:  General exam: Pleasant middle-aged female lying comfortably supine in bed. Did not appear septic or toxic. Respiratory system: Clear. No increased work of breathing. Cardiovascular system: S1 & S2 heard, RRR. No JVD, murmurs, gallops, clicks or pedal edema. Gastrointestinal system: Abdomen is non- distended, soft & nontender. Normal bowel sounds heard. Central nervous system: Alert and oriented. No focal neurological deficits. Extremities: Symmetric 5 x 5 power.   Data Reviewed: Basic Metabolic Panel:  Recent Labs Lab 08/01/15 1020 08/02/15 0107 08/03/15 0814 08/04/15 0333 08/05/15 0320  NA 135* 131* 133* 138 131*  K 4.0 3.2* 3.4* 3.2* 4.1  CL  --  97* 99* 102 100*  CO2 28 26 25  26 26   GLUCOSE 148* 112* 92 91 90  BUN 16.9 13 11 10 8   CREATININE 0.9 0.81 0.79 0.86 1.01*  CALCIUM 9.3 7.6* 8.0* 8.1* 7.6*  MG  --   --  1.8  --   --    Liver Function Tests:  Recent Labs Lab 08/01/15 1020  AST 20  ALT 24  ALKPHOS 86  BILITOT <0.30  PROT 7.5  ALBUMIN 2.2*   No results for input(s): LIPASE, AMYLASE in the last 168 hours. No results for input(s): AMMONIA in the last 168 hours. CBC:  Recent Labs Lab 08/01/15 1019 08/02/15 0107 08/03/15 0814 08/04/15 0333 08/05/15 0320  WBC 15.2* 18.4* 11.9* 9.0 8.8  NEUTROABS 13.5*  --   --   --   --   HGB 9.6* 7.5* 9.5* 8.8* 8.4*  HCT 30.1* 23.5* 30.3* 27.5* 26.2*  MCV 87.7 89.4 90.4 89.9 87.3  PLT 640* 496* 506* 525* 520*   Cardiac Enzymes: No results for input(s): CKTOTAL, CKMB, CKMBINDEX, TROPONINI in the last 168 hours. BNP (last 3 results) No results for input(s): PROBNP in the last 8760 hours. CBG: No results for input(s): GLUCAP in the last 168 hours.  Recent Results (from the past 240 hour(s))  Culture, Blood     Status: None (Preliminary result)   Collection Time: 08/01/15 10:20 AM  Result Value Ref Range Status   BLOOD CULTURE, ROUTINE Preliminary report  Preliminary   RESULT 1 Comment  Preliminary    Comment: No growth  in 36 - 48 hours.  Culture, Blood     Status: None (Preliminary result)   Collection Time: 08/01/15 10:51 AM  Result Value Ref Range Status   BLOOD CULTURE, ROUTINE Preliminary report  Preliminary   RESULT 1 Comment  Preliminary    Comment: No growth in 36 - 48 hours.  Culture, blood (routine x 2)     Status: None (Preliminary result)   Collection Time: 08/01/15 10:04 PM  Result Value Ref Range Status   Specimen Description BLOOD RIGHT HAND  Final   Special Requests BOTTLES DRAWN AEROBIC AND ANAEROBIC 6 CC  Final   Culture   Final    NO GROWTH 2 DAYS Performed at [facility]    Report Status PENDING  Incomplete  Culture, blood (routine x 2)     Status: None  (Preliminary result)   Collection Time: 08/01/15 10:08 PM  Result Value Ref Range Status   Specimen Description BLOOD LEFT HAND  Final   Special Requests BOTTLES DRAWN AEROBIC AND ANAEROBIC 6 ML  Final   Culture   Final    NO GROWTH 2 DAYS Performed at [facility]    Report Status PENDING  Incomplete  Urine culture     Status: None   Collection Time: 08/02/15 11:03 AM  Result Value Ref Range Status   Specimen Description URINE, CLEAN CATCH  Final   Special Requests NONE  Final   Culture   Final    50,000 COLONIES/mL ENTEROCOCCUS SPECIES Performed at [facility]    Report Status 08/04/2015 FINAL  Final   Organism ID, Bacteria ENTEROCOCCUS SPECIES  Final      Susceptibility   Enterococcus species - MIC*    AMPICILLIN <=2 SENSITIVE Sensitive     LEVOFLOXACIN 1 SENSITIVE Sensitive     NITROFURANTOIN <=16 SENSITIVE Sensitive     VANCOMYCIN 1 SENSITIVE Sensitive     * 50,000 COLONIES/mL ENTEROCOCCUS SPECIES  Surgical pcr screen     Status: None   Collection Time: 08/04/15  8:03 PM  Result Value Ref Range Status   MRSA, PCR NEGATIVE NEGATIVE Final   Staphylococcus aureus NEGATIVE NEGATIVE Final    Comment:        The Xpert SA Assay (FDA approved for NASAL specimens in patients over 3 years of age), is one component of a comprehensive surveillance program.  Test performance has been validated by [facility] for patients greater than or equal to 56 year old. It is not intended to diagnose infection nor to guide or monitor treatment.          Studies: US Biopsy  08/03/2015  INDICATION: History of cervical cancer, now with bilateral inguinal lymphadenopathy. Previous from ultrasound-guided biopsy for tissue diagnostic purposes. EXAM: ULTRASOUND BIOPSY CORE LIVER COMPARISON:  CT abdomen pelvis -08/01/2015 MEDICATIONS: None ANESTHESIA/SEDATION: Moderate (conscious) sedation was employed during this procedure. A total of Versed 1.5 mg and Fentanyl 75 mcg was  administered intravenously. Moderate Sedation Time: 10 minutes. The patient's level of consciousness and vital signs were monitored continuously by radiology nursing throughout the procedure under my direct supervision. COMPLICATIONS: None immediate. TECHNIQUE: Informed written consent was obtained from the patient after a discussion of the risks, benefits and alternatives to treatment. Questions regarding the procedure were encouraged and answered. Initial ultrasound scanning demonstrated an enlarged approximately 1.7 x 3.4 cm left inguinal lymph node which correlates with the dominant lymph node seen on prior CT of the abdomen pelvis (image 78, series 2). An ultrasound  image was saved for documentation purposes. The procedure was planned. A timeout was performed prior to the initiation of the procedure. The operative was prepped and draped in the usual sterile fashion, and a sterile drape was applied covering the operative field. A timeout was performed prior to the initiation of the procedure. Local anesthesia was provided with 1% lidocaine with epinephrine. Under direct ultrasound guidance, an 18 gauge core needle device was utilized to obtain to obtain 5 core needle biopsied of the dominant left inguinal lymph node. The samples were placed in saline and submitted to pathology. The needle was removed and hemostasis was achieved with manual compression. Post procedure scan was negative for significant hematoma. A dressing was placed. The patient tolerated the procedure well without immediate postprocedural complication. IMPRESSION: Technically successful ultrasound guided left inguinal lymph node biopsy. Electronically Signed   By: Sandi Mariscal M.D.   On: 08/03/2015 17:53   Dg Colon W/water Sol Cm  08/04/2015  CLINICAL DATA:  History a cervical cancer. Evaluate for rectovaginal an and recto vesicle fistula EXAM: COLON WITH WATER SOLUTION CONTRAST COMPARISON:  CT from 08/01/2015. FINDINGS: On the scout radiograph  bilateral nephro ureteral stents are identified. Enteric contrast material is identified within the colon up to the mid descending colon. Absence of contrast from within the distal colon, rectum, and vagina is noted. Enema tip was placed into the rectum. This when directly through a large defect within the ventral wall of the rectum into the vagina. Under gravity water-soluble contrast material was infused via the enema tube. Initially, this opacified the vagina. Reflux into the rectum via the rectovaginal fistula was identified as well as opacification of the urinary bladder via a fistula between the anterior wall of the vagina and the posterior wall of bladder. IMPRESSION: 1. 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 2. These results were called by telephone at the time of interpretation on 08/04/2015 at 3:45 pm to Dr. Barry Dienes , who verbally acknowledged these results. Electronically Signed   By: Kerby Moors M.D.   On: 08/04/2015 15:46        Scheduled Meds: . alvimopan  12 mg Oral Once  . lactose free nutrition  237 mL Oral TID WC  . lactose free nutrition  237 mL Oral TID BM  . magnesium oxide  400 mg Oral Daily  . pantoprazole (PROTONIX) IV  40 mg Intravenous Q24H  . piperacillin-tazobactam (ZOSYN)  IV  3.375 g Intravenous Q8H  . traMADol  50 mg Oral 4 times per day   Continuous Infusions: . 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    Vernell Leep, MD, FACP, FHM. [facility] Pager [phone]  If 7PM-7AM, please contact night-coverage [email or web address] Password [email or web address] 08/05/2015, 7:20 AM    LOS: 4 days

## 2015-08-05 NOTE — Anesthesia Procedure Notes (Signed)
Procedure Name: Intubation Date/Time: 08/05/2015 1:58 PM Performed by: Deliah Boston Pre-anesthesia Checklist: Patient identified, Emergency Drugs available, Suction available and Patient being monitored Patient Re-evaluated:Patient Re-evaluated prior to inductionOxygen Delivery Method: Circle System Utilized Preoxygenation: Pre-oxygenation with 100% oxygen Intubation Type: IV induction Ventilation: Mask ventilation without difficulty Laryngoscope Size: Mac and 3 Grade View: Grade I Tube type: Oral Tube size: 7.0 mm Number of attempts: 1 Airway Equipment and Method: Oral airway Placement Confirmation: ETT inserted through vocal cords under direct vision,  positive ETCO2 and breath sounds checked- equal and bilateral Secured at: 21 cm Tube secured with: Tape Dental Injury: Teeth and Oropharynx as per pre-operative assessment

## 2015-08-06 DIAGNOSIS — Z933 Colostomy status: Secondary | ICD-10-CM

## 2015-08-06 LAB — CBC
HEMATOCRIT: 29.3 % — AB (ref 36.0–46.0)
HEMOGLOBIN: 9 g/dL — AB (ref 12.0–15.0)
MCH: 28.6 pg (ref 26.0–34.0)
MCHC: 30.7 g/dL (ref 30.0–36.0)
MCV: 93 fL (ref 78.0–100.0)
Platelets: 599 10*3/uL — ABNORMAL HIGH (ref 150–400)
RBC: 3.15 MIL/uL — ABNORMAL LOW (ref 3.87–5.11)
RDW: 19 % — AB (ref 11.5–15.5)
WBC: 10.6 10*3/uL — AB (ref 4.0–10.5)

## 2015-08-06 LAB — BASIC METABOLIC PANEL
ANION GAP: 9 (ref 5–15)
BUN: 10 mg/dL (ref 6–20)
CHLORIDE: 102 mmol/L (ref 101–111)
CO2: 26 mmol/L (ref 22–32)
Calcium: 8.1 mg/dL — ABNORMAL LOW (ref 8.9–10.3)
Creatinine, Ser: 0.95 mg/dL (ref 0.44–1.00)
GFR calc non Af Amer: >60 mL/min (ref >60)
GLUCOSE: 104 mg/dL — AB (ref 65–99)
POTASSIUM: 4 mmol/L (ref 3.5–5.1)
Sodium: 137 mmol/L (ref 135–145)

## 2015-08-06 NOTE — Progress Notes (Signed)
PROGRESS NOTE    Stacy Webster  [id number]  DOB: 1961/02/03  DOA: 08/01/2015 PCP: [name], MD Outpatient Specialists:   Hospital course: 55 y/o female with hx of metastatic cervical cancer, bilateral ureteral strictures status post stents ([name]) presented with 3 day history of passing stool from her vagina. The patient has had low-grade fevers for the better part of one month up to 101.655F prior to admission. The patient presented to the cancer center and saw Drue Second on 08/01/2015. She was noted to have a fever up to 101.55F with tachycardia. As result, the patient was admitted for further evaluation. 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 The patient last received radiation April 2016. Patient declined percutaneous nephrostomies and urology signed off. Oncology/GYN oncology continue to follow. General surgery consulting and s/p laparoscopic colostomy on 3/10. Advancing diet. Possible discharge on Monday or Tuesday.  Assessment & Plan:   Sepsis -Present at the time of admission--she presented with fever and tachycardia -Likely due to colitis/rectovaginal fistula/vaginitis - Lactic acid 1.0, Procalcitonin 0.23 - Blood cultures 2: Negative to date. Urine culture: Shows 50 K colonies of enterococcus-? Fecal contamination - Initially treated with IV fluids and empiric IV Rocephin and Flagyl. Due to persistent nausea and dry heaves, antibiotics changed to Zosyn on 3/8.  - Sepsis physiology resolved. 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 surgery was consulted and patient underwent laparoscopic colostomy on 3/10. General surgery following and advancing diet.  Vesicovaginal fistula/uterine fistula, ureteral stricture- status post bilateral ureteral stents 06/21/15, & ? UTI -Follows [name] -06/21/2015 fistula biopsy was benign -06/21/2015 Right ureteral stent change, new left ureteral stent - Urology input appreciated. Patient declined bilateral percutaneous nephrostomy.  Recurrent/progressive adenocarcinoma of the cervix -Drs. Bridget Hartshorn input appreciated -Last chemotherapy 05/05/2015  - Status post inguinal lymph node biopsy 3/8-results as below  Diagnosis Lymph node, needle/core biopsy, left inguinal METASTATIC CARCINOMA. 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 The neoplasm stains positive for ck8/18, ck903, focal ck20, negative for ER, p16 , cd68 and p63.  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 Hemoglobin 8 gradually dropping again over the last 48 hours to 8.4 today. Follow CBC in a.m. and transfuse if hemoglobin <8 g per DL.  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 -  Magnesium 1.8.  DVT prophylaxis: Lovenox Code Status: Full Family Communication: Discussed with patient's sister on 3/11 Disposition Plan: DC home  when medically stable, possibly Monday or Tuesday.   Consultants:  Urology  General surgery  GYN oncology  Medical oncology  Interventional radiology  Procedures:  US guided biopsy of dominant left inguinal lymph node on 3/8 by IR..  Laparoscopic colostomy 3/10  Antimicrobials:  IV Rocephin 3/6 > 3/7  IV Flagyl 3/6 > 3/7  IV Zosyn 3/8 >   Subjective: Mild appropriate soreness at surgical site but otherwise denies complaints. Tolerated liquid diet this morning. Seen ambulating halls with sister.  Objective: Filed Vitals:   08/05/15 1945 08/05/15 2153 08/06/15 0125 08/06/15 0506  BP: 120/67 123/67 124/60 115/62  Pulse: 86 84 96 87  Temp: 98.1 F (36.7 C) 98.6 F (37 C) 99.1 F (37.3 C) 98 F (36.7 C)  TempSrc: Oral Oral Oral Oral  Resp: 18 18 18 18   Height:      Weight:      SpO2: 98% 100% 100% 100%    Intake/Output Summary (Last 24 hours) at 08/06/15 1644 Last data filed at 08/06/15 1409  Gross per 24 hour  Intake   1650 ml  Output    100 ml  Net   1550 ml   Filed Weights   08/01/15 1330  Weight: 61.689 kg (136 lb)    Exam:  General exam: Pleasant middle-aged female seen ambulating comfortably in the halls. Respiratory system: Clear. No increased work of breathing. Cardiovascular system: S1 & S2 heard, RRR. No JVD, murmurs, gallops, clicks or pedal edema. Gastrointestinal system: Abdomen is non- distended, soft & nontender. Normal bowel sounds heard. Central nervous system: Alert and oriented. No focal neurological deficits. Extremities: Symmetric 5 x 5 power.   Data Reviewed: Basic Metabolic Panel:  Recent Labs Lab 08/02/15 0107 08/03/15 0814 08/04/15 0333 08/05/15 0320 08/06/15 0524  NA 131* 133* 138 138 137  K 3.2* 3.4* 3.2* 4.1 4.0  CL 97* 99* 102 100* 102  CO2 26 25 26 26 26     GLUCOSE 112* 92 91 90 104*  BUN 13 11 10 8 10   CREATININE 0.81 0.79 0.86 1.01* 0.95  CALCIUM 7.6* 8.0* 8.1* 7.6* 8.1*  MG  --  1.8  --   --   --    Liver Function Tests:  Recent Labs Lab 08/01/15 1020  AST 20  ALT 24  ALKPHOS 86  BILITOT <0.30  PROT 7.5  ALBUMIN 2.2*   No results for input(s): LIPASE, AMYLASE in the last 168 hours. No results for input(s): AMMONIA in the last 168 hours. CBC:  Recent Labs Lab 08/01/15 1019 08/02/15 0107 08/03/15 0814 08/04/15 0333 08/05/15 0320 08/06/15 0524  WBC 15.2* 18.4* 11.9* 9.0 8.8 10.6*  NEUTROABS 13.5*  --   --   --   --   --   HGB 9.6* 7.5* 9.5* 8.8* 8.4* 9.0*  HCT 30.1* 23.5* 30.3* 27.5* 26.2* 29.3*  MCV 87.7 89.4 90.4 89.9 87.3 93.0  PLT 640* 496* 506* 525* 520* 599*   Cardiac Enzymes: No results for input(s): CKTOTAL, CKMB, CKMBINDEX, TROPONINI in the last 168 hours. BNP (last 3 results) No results for input(s): PROBNP in the last 8760 hours. CBG: No results for input(s): GLUCAP in the last 168 hours.  Recent Results (from the past 240 hour(s))  Culture, Blood     Status: None (Preliminary result)   Collection Time: 08/01/15 10:20 AM  Result Value Ref Range Status   BLOOD CULTURE, ROUTINE Preliminary report  Preliminary   RESULT 1 Comment  Preliminary    Comment: No growth  in 36 - 48 hours.  Culture, Blood     Status: None (Preliminary result)   Collection Time: 08/01/15 10:51 AM  Result Value Ref Range Status   BLOOD CULTURE, ROUTINE Preliminary report  Preliminary   RESULT 1 Comment  Preliminary    Comment: No growth in 36 - 48 hours.  Culture, blood (routine x 2)     Status: None (Preliminary result)   Collection Time: 08/01/15 10:04 PM  Result Value Ref Range Status   Specimen Description BLOOD RIGHT HAND  Final   Special Requests BOTTLES DRAWN AEROBIC AND ANAEROBIC 6 CC  Final   Culture   Final    NO GROWTH 4 DAYS Performed at [facility]    Report Status PENDING  Incomplete  Culture, blood  (routine x 2)     Status: None (Preliminary result)   Collection Time: 08/01/15 10:08 PM  Result Value Ref Range Status   Specimen Description BLOOD LEFT HAND  Final   Special Requests BOTTLES DRAWN AEROBIC AND ANAEROBIC 6 ML  Final   Culture   Final    NO GROWTH 4 DAYS Performed at [facility]    Report Status PENDING  Incomplete  Urine culture     Status: None   Collection Time: 08/02/15 11:03 AM  Result Value Ref Range Status   Specimen Description URINE, CLEAN CATCH  Final   Special Requests NONE  Final   Culture   Final    50,000 COLONIES/mL ENTEROCOCCUS SPECIES Performed at [facility]    Report Status 08/04/2015 FINAL  Final   Organism ID, Bacteria ENTEROCOCCUS SPECIES  Final      Susceptibility   Enterococcus species - MIC*    AMPICILLIN <=2 SENSITIVE Sensitive     LEVOFLOXACIN 1 SENSITIVE Sensitive     NITROFURANTOIN <=16 SENSITIVE Sensitive     VANCOMYCIN 1 SENSITIVE Sensitive     * 50,000 COLONIES/mL ENTEROCOCCUS SPECIES  Surgical pcr screen     Status: None   Collection Time: 08/04/15  8:03 PM  Result Value Ref Range Status   MRSA, PCR NEGATIVE NEGATIVE Final   Staphylococcus aureus NEGATIVE NEGATIVE Final    Comment:        The Xpert SA Assay (FDA approved for NASAL specimens in patients over 32 years of age), is one component of a comprehensive surveillance program.  Test performance has been validated by [facility] for patients greater than or equal to 21 year old. It is not intended to diagnose infection nor to guide or monitor treatment.          Studies: No results found.      Scheduled Meds: . enoxaparin (LOVENOX) injection  40 mg Subcutaneous Q24H  . lactose free nutrition  237 mL Oral TID WC  . lactose free nutrition  237 mL Oral TID BM  . magnesium oxide  400 mg Oral Daily  . pantoprazole (PROTONIX) IV  40 mg Intravenous Q24H  . piperacillin-tazobactam (ZOSYN)  IV  3.375 g Intravenous Q8H   Continuous  Infusions: . 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    Vernell Leep, MD, [place],  FHM. [facility] Pager [phone]  If 7PM-7AM, please contact night-coverage [email or web address] Password [email or web address] 08/06/2015, 4:44 PM    LOS: 5 days

## 2015-08-06 NOTE — Progress Notes (Signed)
Patient ID: Stacy Webster, female   DOB: [birth year], 55 y.o.   MRN: [id number] [facility] Surgery Progress Note:   1 Day Post-Op  Subjective: Mental status is alert and pleasant.   Objective: Vital signs in last 24 hours: Temp:  [97.6 F (36.4 C)-99.1 F (37.3 C)] 98 F (36.7 C) (03/11 0506) Pulse Rate:  [84-97] 87 (03/11 0506) Resp:  [9-18] 18 (03/11 0506) BP: (115-147)/(60-82) 115/62 mmHg (03/11 0506) SpO2:  [98 %-100 %] 100 % (03/11 0506)  Intake/Output from previous day: 03/10 0701 - 03/11 0700 In: 3415.8 [I.V.:3365.8; IV Piggyback:50] Out: 65 [Urine:5; Stool:50; Blood:10] Intake/Output this shift:    Physical Exam: Work of breathing is normal.  LLQ ostomy is working with dark green liquid feces.    Lab Results:  Results for orders placed or performed during the hospital encounter of 08/01/15 (from the past 48 hour(s))  Surgical pcr screen     Status: None   Collection Time: 08/04/15  8:03 PM  Result Value Ref Range   MRSA, PCR NEGATIVE NEGATIVE   Staphylococcus aureus NEGATIVE NEGATIVE    Comment:        The Xpert SA Assay (FDA approved for NASAL specimens in patients over 64 years of age), is one component of a comprehensive surveillance program.  Test performance has been validated by [facility] for patients greater than or equal to 74 year old. It is not intended to diagnose infection nor to guide or monitor treatment.   Basic metabolic panel     Status: Abnormal   Collection Time: 08/05/15  3:20 AM  Result Value Ref Range   Sodium 138 135 - 145 mmol/L    Comment: CORRECTED RESULTS CALLED TO: K. PERIKA RN AT 5374 ON 03.10.17 BY SHUEA CORRECTED ON 03/10 AT 1239: PREVIOUSLY REPORTED AS 131 DELTA CHECK NOTED REPEATED TO VERIFY    Potassium 4.1 3.5 - 5.1 mmol/L    Comment: DELTA CHECK NOTED REPEATED TO VERIFY    Chloride 100 (L) 101 - 111 mmol/L   CO2 26 22 - 32 mmol/L   Glucose, Bld 90 65 - 99 mg/dL   BUN 8 6 - 20 mg/dL   Creatinine, Ser 1.01  (H) 0.44 - 1.00 mg/dL   Calcium 7.6 (L) 8.9 - 10.3 mg/dL   GFR calc non Af Amer >60 >60 mL/min   GFR calc Af Amer >60 >60 mL/min    Comment: (NOTE) The eGFR has been calculated using the CKD EPI equation. This calculation has not been validated in all clinical situations. eGFR's persistently <60 mL/min signify possible Chronic Kidney Disease.    Anion gap 5 5 - 15  CBC     Status: Abnormal   Collection Time: 08/05/15  3:20 AM  Result Value Ref Range   WBC 8.8 4.0 - 10.5 K/uL   RBC 3.00 (L) 3.87 - 5.11 MIL/uL   Hemoglobin 8.4 (L) 12.0 - 15.0 g/dL   HCT 26.2 (L) 36.0 - 46.0 %   MCV 87.3 78.0 - 100.0 fL   MCH 28.0 26.0 - 34.0 pg   MCHC 32.1 30.0 - 36.0 g/dL   RDW 18.2 (H) 11.5 - 15.5 %   Platelets 520 (H) 150 - 400 K/uL  Type and screen [place]     Status: None   Collection Time: 08/05/15  1:40 PM  Result Value Ref Range   ABO/RH(D) A POS    Antibody Screen NEG    Sample Expiration 08/08/2015   CBC  Status: Abnormal   Collection Time: 08/06/15  5:24 AM  Result Value Ref Range   WBC 10.6 (H) 4.0 - 10.5 K/uL   RBC 3.15 (L) 3.87 - 5.11 MIL/uL   Hemoglobin 9.0 (L) 12.0 - 15.0 g/dL   HCT 29.3 (L) 36.0 - 46.0 %   MCV 93.0 78.0 - 100.0 fL   MCH 28.6 26.0 - 34.0 pg   MCHC 30.7 30.0 - 36.0 g/dL   RDW 19.0 (H) 11.5 - 15.5 %   Platelets 599 (H) 150 - 400 K/uL  Basic metabolic panel     Status: Abnormal   Collection Time: 08/06/15  5:24 AM  Result Value Ref Range   Sodium 137 135 - 145 mmol/L   Potassium 4.0 3.5 - 5.1 mmol/L   Chloride 102 101 - 111 mmol/L   CO2 26 22 - 32 mmol/L   Glucose, Bld 104 (H) 65 - 99 mg/dL   BUN 10 6 - 20 mg/dL   Creatinine, Ser 0.95 0.44 - 1.00 mg/dL   Calcium 8.1 (L) 8.9 - 10.3 mg/dL   GFR calc non Af Amer >60 >60 mL/min   GFR calc Af Amer >60 >60 mL/min    Comment: (NOTE) The eGFR has been calculated using the CKD EPI equation. This calculation has not been validated in all clinical situations. eGFR's persistently <60  mL/min signify possible Chronic Kidney Disease.    Anion gap 9 5 - 15    Radiology/Results: Dg Colon W/water Sol Cm  08/04/2015  CLINICAL DATA:  History a cervical cancer. Evaluate for rectovaginal an and recto vesicle fistula EXAM: COLON WITH WATER SOLUTION CONTRAST COMPARISON:  CT from 08/01/2015. FINDINGS: On the scout radiograph bilateral nephro ureteral stents are identified. Enteric contrast material is identified within the colon up to the mid descending colon. Absence of contrast from within the distal colon, rectum, and vagina is noted. Enema tip was placed into the rectum. This when directly through a large defect within the ventral wall of the rectum into the vagina. Under gravity water-soluble contrast material was infused via the enema tube. Initially, this opacified the vagina. Reflux into the rectum via the rectovaginal fistula was identified as well as opacification of the urinary bladder via a fistula between the anterior wall of the vagina and the posterior wall of bladder. IMPRESSION: 1. 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 2. These results were called by telephone at the time of interpretation on 08/04/2015 at 3:45 pm to Dr. Barry Dienes , who verbally acknowledged these results. Electronically Signed   By: Kerby Moors M.D.   On: 08/04/2015 15:46    Anti-infectives: Anti-infectives    Start     Dose/Rate Route Frequency Ordered Stop   08/03/15 1000  piperacillin-tazobactam (ZOSYN) IVPB 3.375 g     3.375 g 12.5 mL/hr over 240 Minutes Intravenous Every 8 hours 08/03/15 0948     08/02/15 1400  cefTRIAXone (ROCEPHIN) 1 g in dextrose 5 % 50 mL IVPB  Status:  Discontinued     1 g 100 mL/hr over 30 Minutes Intravenous Every 24 hours 08/01/15 2035 08/03/15 0947   08/01/15 2200  metroNIDAZOLE (FLAGYL) IVPB 500 mg  Status:  Discontinued     500 mg 100 mL/hr over 60 Minutes Intravenous Every 8 hours 08/01/15 2035 08/03/15 0947   08/01/15  1430  cefTRIAXone (ROCEPHIN) 1 g in dextrose 5 % 50 mL IVPB     1 g 100 mL/hr over 30 Minutes Intravenous  Once 08/01/15  1424 08/01/15 1545   08/01/15 1430  metroNIDAZOLE (FLAGYL) IVPB 500 mg     500 mg 100 mL/hr over 60 Minutes Intravenous  Once 08/01/15 1424 08/01/15 1545      Assessment/Plan: Problem List: Patient Active Problem List   Diagnosis Date Noted  . S/P colostomy ([place]) 08/05/2015  . Colovesical fistula 08/02/2015  . Hyponatremia 08/02/2015  . Fever 08/02/2015  . Anemia 08/01/2015  . Ureteral obstruction, right 06/25/2015  . Ureteral obstruction, left 06/25/2015  . Vesicovaginal fistula 06/25/2015  . Anemia due to acute blood loss 06/09/2015  . Dehydration 06/09/2015  . Hypoalbuminemia due to protein-calorie malnutrition ([place]) 06/09/2015  . Other specified fever 04/20/2015  . Pyometrium 03/25/2015  . Gastritis and gastroduodenitis   . Antineoplastic chemotherapy induced anemia 02/08/2015  . Rotator cuff arthropathy 02/08/2015  . Recurrent cervical cancer ([place]) 02/08/2015  . High risk medication use 02/08/2015  . Proteinuria 02/08/2015  . Chemotherapy-induced peripheral neuropathy ([place]) 12/04/2014  . Chemotherapy induced neutropenia ([place]) 12/04/2014  . Asthma exacerbation attacks 11/12/2014  . Cancer associated pain 11/12/2014  . Iron deficiency anemia due to chronic blood loss 11/12/2014  . 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 Unintentional weight loss 11/12/2014  . Protein calorie malnutrition ([place]) 11/12/2014  . Malnutrition of moderate degree ([place]) 10/16/2014  . Cervical cancer ([place])   . Bacteremia due to Gram-positive bacteria 10/13/2014  . Ureteral stricture, right 10/13/2014  . Hydronephrosis, right   . Sepsis ([place]) 10/12/2014  . Perinephric fluid collection   . Blood poisoning ([place])     Has begun full liquid diet today.  Stable postop.  1 Day Post-Op    LOS: 5 days   Matt B. Hassell Done, MD, [facility] [phone] beeper [phone]  08/06/2015 8:06 AM

## 2015-08-07 LAB — CULTURE, BLOOD (ROUTINE X 2)
Culture: NO GROWTH
Culture: NO GROWTH

## 2015-08-07 LAB — CULTURE, BLOOD (SINGLE)

## 2015-08-07 MED ORDER — PANTOPRAZOLE SODIUM 40 MG PO TBEC
40.0000 mg | DELAYED_RELEASE_TABLET | Freq: Every day | ORAL | Status: DC
  Administered 2015-08-07: 40 mg via ORAL
  Filled 2015-08-07 (×2): qty 1

## 2015-08-07 NOTE — Progress Notes (Signed)
Patient ID: Stacy Webster, female   DOB: 29-May-1960, 55 y.o.   MRN: [id number] [facility] Surgery Progress Note:   2 Days Post-Op  Subjective: Mental status is clear.  Feeling good.  Ostomy functioning well Objective: Vital signs in last 24 hours: Temp:  [97.9 F (36.6 C)-98.1 F (36.7 C)] 98 F (36.7 C) (03/12 0536) Pulse Rate:  [82-88] 88 (03/12 0536) Resp:  [16-18] 16 (03/12 0536) BP: (101-129)/(66-88) 101/88 mmHg (03/12 0536) SpO2:  [100 %] 100 % (03/12 0536)  Intake/Output from previous day: 03/11 0701 - 03/12 0700 In: 950 [I.V.:900; IV Piggyback:50] Out: 150 [Stool:150] Intake/Output this shift:    Physical Exam: Work of breathing is normal.  Ostomy ok.   Lab Results:  Results for orders placed or performed during the hospital encounter of 08/01/15 (from the past 48 hour(s))  Type and screen [place]     Status: None   Collection Time: 08/05/15  1:40 PM  Result Value Ref Range   ABO/RH(D) A POS    Antibody Screen NEG    Sample Expiration 08/08/2015   CBC     Status: Abnormal   Collection Time: 08/06/15  5:24 AM  Result Value Ref Range   WBC 10.6 (H) 4.0 - 10.5 K/uL   RBC 3.15 (L) 3.87 - 5.11 MIL/uL   Hemoglobin 9.0 (L) 12.0 - 15.0 g/dL   HCT 29.3 (L) 36.0 - 46.0 %   MCV 93.0 78.0 - 100.0 fL   MCH 28.6 26.0 - 34.0 pg   MCHC 30.7 30.0 - 36.0 g/dL   RDW 19.0 (H) 11.5 - 15.5 %   Platelets 599 (H) 150 - 400 K/uL  Basic metabolic panel     Status: Abnormal   Collection Time: 08/06/15  5:24 AM  Result Value Ref Range   Sodium 137 135 - 145 mmol/L   Potassium 4.0 3.5 - 5.1 mmol/L   Chloride 102 101 - 111 mmol/L   CO2 26 22 - 32 mmol/L   Glucose, Bld 104 (H) 65 - 99 mg/dL   BUN 10 6 - 20 mg/dL   Creatinine, Ser 0.95 0.44 - 1.00 mg/dL   Calcium 8.1 (L) 8.9 - 10.3 mg/dL   GFR calc non Af Amer >60 >60 mL/min   GFR calc Af Amer >60 >60 mL/min    Comment: (NOTE) The eGFR has been calculated using the CKD EPI equation. This calculation has  not been validated in all clinical situations. eGFR's persistently <60 mL/min signify possible Chronic Kidney Disease.    Anion gap 9 5 - 15    Radiology/Results: No results found.  Anti-infectives: Anti-infectives    Start     Dose/Rate Route Frequency Ordered Stop   08/03/15 1000  piperacillin-tazobactam (ZOSYN) IVPB 3.375 g     3.375 g 12.5 mL/hr over 240 Minutes Intravenous Every 8 hours 08/03/15 0948     08/02/15 1400  cefTRIAXone (ROCEPHIN) 1 g in dextrose 5 % 50 mL IVPB  Status:  Discontinued     1 g 100 mL/hr over 30 Minutes Intravenous Every 24 hours 08/01/15 2035 08/03/15 0947   08/01/15 2200  metroNIDAZOLE (FLAGYL) IVPB 500 mg  Status:  Discontinued     500 mg 100 mL/hr over 60 Minutes Intravenous Every 8 hours 08/01/15 2035 08/03/15 0947   08/01/15 1430  cefTRIAXone (ROCEPHIN) 1 g in dextrose 5 % 50 mL IVPB     1 g 100 mL/hr over 30 Minutes Intravenous  Once 08/01/15 1424 08/01/15 1545  08/01/15 1430  metroNIDAZOLE (FLAGYL) IVPB 500 mg     500 mg 100 mL/hr over 60 Minutes Intravenous  Once 08/01/15 1424 08/01/15 1545      Assessment/Plan: Problem List: Patient Active Problem List   Diagnosis Date Noted  . S/P colostomy ([place]) 08/05/2015  . Colovesical fistula 08/02/2015  . Hyponatremia 08/02/2015  . Fever 08/02/2015  . Anemia 08/01/2015  . Ureteral obstruction, right 06/25/2015  . Ureteral obstruction, left 06/25/2015  . Vesicovaginal fistula 06/25/2015  . Anemia due to acute blood loss 06/09/2015  . Dehydration 06/09/2015  . Hypoalbuminemia due to protein-calorie malnutrition ([place]) 06/09/2015  . Other specified fever 04/20/2015  . Pyometrium 03/25/2015  . Gastritis and gastroduodenitis   . Antineoplastic chemotherapy induced anemia 02/08/2015  . Rotator cuff arthropathy 02/08/2015  . Recurrent cervical cancer ([place]) 02/08/2015  . High risk medication use 02/08/2015  . Proteinuria 02/08/2015  . Chemotherapy-induced peripheral neuropathy ([place])  12/04/2014  . Chemotherapy induced neutropenia ([place]) 12/04/2014  . Asthma exacerbation attacks 11/12/2014  . Cancer associated pain 11/12/2014  . Iron deficiency anemia due to chronic blood loss 11/12/2014  . 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 Unintentional weight loss 11/12/2014  . Protein calorie malnutrition ([place]) 11/12/2014  . Malnutrition of moderate degree ([place]) 10/16/2014  . Cervical cancer ([place])   . Bacteremia due to Gram-positive bacteria 10/13/2014  . Ureteral stricture, right 10/13/2014  . Hydronephrosis, right   . Sepsis ([place]) 10/12/2014  . Perinephric fluid collection   . Blood poisoning ([place])     Will advance diet to regular.  Probably one more day of antibiotics for fistulae (now diverted) 2 Days Post-Op    LOS: 6 days   Matt B. Hassell Done, MD, [facility] [phone] beeper [phone]  08/07/2015 8:45 AM

## 2015-08-07 NOTE — Progress Notes (Signed)
PROGRESS NOTE    Stacy Webster  [id number]  DOB: [birth year]  DOA: 08/01/2015 PCP: [name], MD Outpatient Specialists:   Hospital course: 55 y/o female with hx of metastatic cervical cancer, bilateral ureteral strictures status post stents ([name]) presented with 3 day history of passing stool from her vagina. The patient has had low-grade fevers for the better part of one month up to 101.464F prior to admission. The patient presented to the cancer center and saw Drue Second on 08/01/2015. She was noted to have a fever up to 101.64F with tachycardia. As result, the patient was admitted for further evaluation. 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 The patient last received radiation April 2016. Patient declined percutaneous nephrostomies and urology signed off. Oncology/GYN oncology continue to follow. General surgery consulting and s/p laparoscopic colostomy on 3/10. Advancing diet. Possible discharge on Monday or Tuesday.  Assessment & Plan:   Sepsis -Present at the time of admission--she presented with fever and tachycardia -Likely due to colitis/rectovaginal fistula/vaginitis - Lactic acid 1.0, Procalcitonin 0.23 - Blood cultures 2: Negative to date. Urine culture: Shows 50 K colonies of enterococcus-? Fecal contamination - Initially treated with IV fluids and empiric IV Rocephin and Flagyl. Due to persistent nausea and dry heaves, antibiotics changed to Zosyn on 3/8.  - Sepsis physiology resolved. - Discussed with Dr. Hassell Done, CCS: Continue antibiotics for additional 24 hours and then may stop.  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 surgery was consulted and patient underwent laparoscopic colostomy on 3/10. General surgery following and advancing diet.  Vesicovaginal fistula/uterine fistula, ureteral stricture- status post bilateral ureteral stents 06/21/15, & ? UTI -Follows [name] -06/21/2015 fistula biopsy was benign -06/21/2015 Right ureteral stent change, new left ureteral stent - Urology input appreciated. Patient declined bilateral percutaneous nephrostomy.  Recurrent/progressive adenocarcinoma of the cervix -Drs. Bridget Hartshorn input appreciated -Last chemotherapy 05/05/2015  - Status post inguinal lymph node biopsy 3/8-results as below  Diagnosis Lymph node, needle/core biopsy, left inguinal METASTATIC CARCINOMA. 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 The neoplasm stains positive for ck8/18, ck903, focal ck20, negative for ER, p16 , cd68 and p63.  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 Hemoglobin 8 gradually dropping again over the last 48 hours to 8.4 today. Follow CBC in a.m. and transfuse if hemoglobin <8 g per DL.  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 - Magnesium 1.8.  DVT prophylaxis: Lovenox Code Status: Full Family Communication: Discussed with patient's daughter on  3/12 Disposition Plan: DC home when medically stable, possibly Monday or Tuesday.   Consultants:  Urology  General surgery  GYN oncology  Medical oncology  Interventional radiology  Procedures:  US guided biopsy of dominant left inguinal lymph node on 3/8 by IR..  Laparoscopic colostomy 3/10  Antimicrobials:  IV Rocephin 3/6 > 3/7  IV Flagyl 3/6 > 3/7  IV Zosyn 3/8 >   Subjective: Tolerating diet. Having colostomy output. Abdominal soreness better.  Objective: Filed Vitals:   08/06/15 0506 08/06/15 1400 08/06/15 2250 08/07/15 0536  BP: 115/62 117/66 129/68 101/88  Pulse: 87 82 86 88  Temp: 98 F (36.7 C) 97.9 F (36.6 C) 98.1 F (36.7 C) 98 F (36.7 C)  TempSrc: Oral Tympanic Oral Oral  Resp: 18 18 18 16   Height:      Weight:      SpO2: 100% 100% 100% 100%    Intake/Output Summary (Last 24 hours) at 08/07/15 1205 Last data filed at 08/07/15 1000  Gross per 24 hour  Intake    900 ml  Output    150 ml  Net    750 ml   Filed Weights   08/01/15 1330  Weight: 61.689 kg (136 lb)    Exam:  General exam: Pleasant middle-aged female lying comfortably in bed. Respiratory system: Clear. No increased work of breathing. Cardiovascular system: S1 & S2 heard, RRR. No JVD, murmurs, gallops, clicks or pedal edema. Gastrointestinal system: Abdomen is non- distended, soft & nontender. Normal bowel sounds heard. Colostomy+ Central nervous system: Alert and oriented. No focal neurological deficits. Extremities: Symmetric 5 x 5 power.   Data Reviewed: Basic Metabolic Panel:  Recent Labs Lab 08/02/15 0107 08/03/15 0814 08/04/15 0333 08/05/15 0320 08/06/15 0524  NA 131* 133* 138 138 137  K 3.2* 3.4* 3.2* 4.1 4.0  CL 97* 99* 102 100* 102  CO2 26 25 26 26 26   GLUCOSE 112* 92 91 90 104*  BUN 13 11 10  8 10   CREATININE 0.81 0.79 0.86 1.01* 0.95  CALCIUM 7.6* 8.0* 8.1* 7.6* 8.1*  MG  --  1.8  --   --   --    Liver Function Tests:  Recent Labs Lab 08/01/15 1020  AST 20  ALT 24  ALKPHOS 86  BILITOT <0.30  PROT 7.5  ALBUMIN 2.2*   No results for input(s): LIPASE, AMYLASE in the last 168 hours. No results for input(s): AMMONIA in the last 168 hours. CBC:  Recent Labs Lab 08/01/15 1019 08/02/15 0107 08/03/15 0814 08/04/15 0333 08/05/15 0320 08/06/15 0524  WBC 15.2* 18.4* 11.9* 9.0 8.8 10.6*  NEUTROABS 13.5*  --   --   --   --   --   HGB 9.6* 7.5* 9.5* 8.8* 8.4* 9.0*  HCT 30.1* 23.5* 30.3* 27.5* 26.2* 29.3*  MCV 87.7 89.4 90.4 89.9 87.3 93.0  PLT 640* 496* 506* 525* 520* 599*   Cardiac Enzymes: No results for input(s): CKTOTAL, CKMB, CKMBINDEX, TROPONINI in the last 168 hours. BNP (last 3 results) No results for input(s): PROBNP in the last 8760 hours. CBG: No results for input(s): GLUCAP in the last 168 hours.  Recent Results (from the past 240 hour(s))  Culture, Blood     Status: None (Preliminary result)   Collection Time: 08/01/15 10:20 AM  Result Value Ref Range Status   BLOOD CULTURE, ROUTINE Preliminary report  Preliminary   RESULT 1 Comment  Preliminary    Comment: No growth in 36 - 48 hours.  Culture,  Blood     Status: None (Preliminary result)   Collection Time: 08/01/15 10:51 AM  Result Value Ref Range Status   BLOOD CULTURE, ROUTINE Preliminary report  Preliminary   RESULT 1 Comment  Preliminary    Comment: No growth in 36 - 48 hours.  Culture, blood (routine x 2)     Status: None   Collection Time: 08/01/15 10:04 PM  Result Value Ref Range Status   Specimen Description BLOOD RIGHT HAND  Final   Special Requests BOTTLES DRAWN AEROBIC AND ANAEROBIC 6 CC  Final   Culture   Final    NO GROWTH 5 DAYS Performed at [facility]    Report Status 08/07/2015 FINAL  Final  Culture, blood (routine x 2)     Status: None   Collection Time: 08/01/15  10:08 PM  Result Value Ref Range Status   Specimen Description BLOOD LEFT HAND  Final   Special Requests BOTTLES DRAWN AEROBIC AND ANAEROBIC 6 ML  Final   Culture   Final    NO GROWTH 5 DAYS Performed at [facility]    Report Status 08/07/2015 FINAL  Final  Urine culture     Status: None   Collection Time: 08/02/15 11:03 AM  Result Value Ref Range Status   Specimen Description URINE, CLEAN CATCH  Final   Special Requests NONE  Final   Culture   Final    50,000 COLONIES/mL ENTEROCOCCUS SPECIES Performed at [facility]    Report Status 08/04/2015 FINAL  Final   Organism ID, Bacteria ENTEROCOCCUS SPECIES  Final      Susceptibility   Enterococcus species - MIC*    AMPICILLIN <=2 SENSITIVE Sensitive     LEVOFLOXACIN 1 SENSITIVE Sensitive     NITROFURANTOIN <=16 SENSITIVE Sensitive     VANCOMYCIN 1 SENSITIVE Sensitive     * 50,000 COLONIES/mL ENTEROCOCCUS SPECIES  Surgical pcr screen     Status: None   Collection Time: 08/04/15  8:03 PM  Result Value Ref Range Status   MRSA, PCR NEGATIVE NEGATIVE Final   Staphylococcus aureus NEGATIVE NEGATIVE Final    Comment:        The Xpert SA Assay (FDA approved for NASAL specimens in patients over 58 years of age), is one component of a comprehensive surveillance program.  Test performance has been validated by [facility] for patients greater than or equal to 62 year old. It is not intended to diagnose infection nor to guide or monitor treatment.          Studies: No results found.      Scheduled Meds: . enoxaparin (LOVENOX) injection  40 mg Subcutaneous Q24H  . lactose free nutrition  237 mL Oral TID WC  . lactose free nutrition  237 mL Oral TID BM  . magnesium oxide  400 mg Oral Daily  . pantoprazole (PROTONIX) IV  40 mg Intravenous Q24H  . piperacillin-tazobactam (ZOSYN)  IV  3.375 g Intravenous Q8H   Continuous Infusions: . 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    Vernell Leep, MD, FACP, FHM. [facility] Pager [phone]  If 7PM-7AM,  please contact night-coverage [email or web address] Password [email or web address] 08/07/2015, 12:05 PM    LOS: 6 days

## 2015-08-07 NOTE — Progress Notes (Signed)
CONCERNING: IV to Oral Route Change Policy  RECOMMENDATION: This patient is receiving Protonix by the intravenous route.  Based on criteria approved by the Pharmacy and Therapeutics Committee, the intravenous medication(s) is/are being converted to the equivalent oral dose form(s).   DESCRIPTION: These criteria include:  The patient is eating (either orally or via tube) and/or has been taking other orally administered medications for a least 24 hours  The patient has no evidence of active gastrointestinal bleeding or impaired GI absorption (gastrectomy, short bowel, patient on TNA or NPO).  If you have questions about this conversion, please contact the Pharmacy Department  []   [phone] )  [facility] []   [phone] )  [facility] []   [phone] )  [facility] []   [phone] )  [facility] [x]   [phone] )  [place], [facility] 08/07/2015 12:12 PM

## 2015-08-08 NOTE — Discharge Instructions (Signed)
Colostomy Home Guide °A colostomy is an opening for stool to leave your body when a medical condition prevents it from leaving through the usual opening (rectum). During a surgery, a piece of large intestine (colon) is brought through a hole in the abdominal wall. The new opening is called a stoma or ostomy. A bag or pouch fits over the stoma to catch stool and gas. Your stool may be liquid, somewhat pasty, or formed. °CARING FOR YOUR STOMA  °Normally, the stoma looks a lot like the inside of your cheek: pink, red, and moist. At first it may be swollen, but this swelling will decrease within 6 weeks. °Keep the skin around your stoma clean and dry. You can gently wash your stoma and the skin around your stoma in the shower with a clean, soft washcloth. If you develop any skin irritation, your caregiver may give you a stoma powder or ointment to help heal the area. Do not use any products other than those specifically given to you by your caregiver.  °Your stoma should not be uncomfortable. If you notice any stinging or burning, your pouch may be leaking, and the skin around your stoma may be coming into contact with stool. This can cause skin irritation. If you notice stinging, replace your pouch with a new one and discard the old one. °OSTOMY POUCHES  °The pouch that fits over the ostomy can be made up of either 1 or 2 pieces. A one-piece pouch has a skin barrier piece and the pouch itself in one unit. A two-piece pouch has a skin barrier with a separate pouch that snaps on and off of the skin barrier. Either way, you should empty the pouch when it is only  to ½ full. Do not let more stool or gas build up. This could cause the pouch to leak. °Some ostomy bags have a built-in gas release valve. Ostomy deodorizer (5 drops) can be put into the pouch to prevent odor. Some people use ostomy lubricant drops inside the pouch to help the stool slide out of the bag more easily and completely.  °EMPTYING YOUR OSTOMY POUCH    °You may get lessons on how to empty your pouch from a wound-ostomy nurse before you leave the hospital. Here are the basic steps: °· Wash your hands with soap and water. °· Sit far back on the toilet. °· Put several pieces of toilet paper into the toilet water. This will prevent splashing as you empty the stool into the toilet bowl. °· Unclip or unvelcro the tail end of the pouch. °· Unroll the tail and empty stool into the toilet. °· Clean the tail with toilet paper. °· Reroll the tail, and clip or velcro it closed. °· Wash your hands again. °CHANGING YOUR OSTOMY POUCH  °Change your ostomy pouch about every 3 to 4 days for the first 6 weeks, then every 5 to7 days. Always change the bag sooner if there is any leakage or you begin to notice any discomfort or irritation of the skin around the stoma. When possible, plan to change your ostomy pouch before eating or drinking as this will lessen the chance of stool coming out during the pouch change. A wound-ostomy nurse may teach you how to change your pouch before you leave the hospital. Here are the basic steps: °· Lay out your supplies. °· Wash your hands with soap and water. °· Carefully remove the old pouch. °· Wash the stoma and allow it to dry. Men may be   advised to shave any hair around the stoma very carefully. This will make the adhesive stick better. °· Use the stoma measuring guide that comes with your pouch set to decide what size hole you will need to cut in the skin barrier piece. Choose the smallest possible size that will hold the stoma but will not touch it. °· Use the guide to trace the circle on the back of the skin barrier piece. Cut out the hole. °· Hold the skin barrier piece over the stoma to make sure the hole is the correct size. °· Remove the adhesive paper backing from the skin barrier piece. °· Squeeze stoma paste around the opening of the skin barrier piece. °· Clean and dry the skin around the stoma again. °· Carefully fit the skin  barrier piece over your stoma. °· If you are using a two-piece pouch, snap the pouch onto the skin barrier piece. °· Close the tail of the pouch. °· Put your hand over the top of the skin barrier piece to help warm it for about 5 minutes, so that it conforms to your body better. °· Wash your hands again. °DIET TIPS  °· Continue to follow your usual diet. °· Drink about eight 8 oz glasses of water each day. °· You can prevent gas by eating slowly and chewing your food thoroughly. °· If you feel concerned that you have too much gas, you can cut back on gas-producing foods, such as: °¨ Spicy foods. °¨ Onions and garlic. °¨ Cruciferous vegetables (cabbage, broccoli, cauliflower, Brussels sprouts). °¨ Beans and legumes. °¨ Some cheeses. °¨ Eggs. °¨ Fish. °¨ Bubbly (carbonated) drinks. °¨ Chewing gum. °GENERAL TIPS  °· You can shower with or without the bag in place. °· Always keep the bag on if you are bathing or swimming. °· If your bag gets wet, you can dry it with a blow-dryer set to cool. °· Avoid wearing tight clothing directly over your stoma so that it does not become irritated or bleed. Tight clothing can also prevent stool from draining into the pouch. °· It is helpful to always have an extra skin barrier and pouch with you when traveling. Do not leave them anywhere too warm, as parts of them can melt. °· Do not let your seat belt rest on your stoma. Try to keep the seat belt either above or below your stoma, or use a tiny pillow to cushion it. °· You can still participate in sports, but you should avoid activities in which there is a risk of getting hit in the abdomen. °· You can still have sex. It is a good idea to empty your pouch prior to sex. Some people and their partners feel very comfortable seeing the pouch during sex. Others choose to wear lingerie or a T-shirt that covers the device. °SEEK IMMEDIATE MEDICAL CARE IF: °· You notice a change in the size or color of the stoma, especially if it becomes  very red, purple, black, or pale white. °· You have bloody stools or bleeding from the stoma. °· You have abdominal pain, nausea, vomiting, or bloating. °· There is anything unusual protruding from the stoma. °· You have irritation or red skin around the stoma. °· No stool is passing from the stoma. °· You have diarrhea (requiring more frequent than normal pouch emptying). °  °This information is not intended to replace advice given to you by your health care provider. Make sure you discuss any questions you have with your health care   provider.   Document Released: 05/17/2003 Document Revised: 08/06/2011 Document Reviewed: 10/11/2010 Elsevier Interactive Patient Education 2016 [place] ______[employer] LAPAROSCOPIC SURGERY: POST OP INSTRUCTIONS Always review your discharge instruction sheet given to you by the facility where your surgery was performed. IF YOU HAVE DISABILITY OR FAMILY LEAVE FORMS, YOU MUST BRING THEM TO THE OFFICE FOR PROCESSING.   DO NOT GIVE THEM TO YOUR DOCTOR.  1. A prescription for pain medication may be given to you upon discharge.  Take your pain medication as prescribed, if needed.  If narcotic pain medicine is not needed, then you may take acetaminophen (Tylenol) or ibuprofen (Advil) as needed. 2. Take your usually prescribed medications unless otherwise directed. 3. If you need a refill on your pain medication, please contact your pharmacy.  They will contact our office to request authorization. Prescriptions will not be filled after 5pm or on week-ends. 4. You should follow a light diet the first few days after arrival home, such as soup and crackers, etc.  Be sure to include lots of fluids daily. 5. Most patients will experience some swelling and bruising in the area of the incisions.  Ice packs will help.  Swelling and bruising can take several days to resolve.  6. It is common to experience some constipation if taking pain medication after  surgery.  Increasing fluid intake and taking a stool softener (such as Colace) will usually help or prevent this problem from occurring.  A mild laxative (Milk of Magnesia or Miralax) should be taken according to package instructions if there are no bowel movements after 48 hours. 7. Unless discharge instructions indicate otherwise, you may remove your bandages 24-48 hours after surgery, and you may shower at that time.  You may have steri-strips (small skin tapes) in place directly over the incision.  These strips should be left on the skin for 7-10 days.  If your surgeon used skin glue on the incision, you may shower in 24 hours.  The glue will flake off over the next 2-3 weeks.  Any sutures or staples will be removed at the office during your follow-up visit. 8. ACTIVITIES:  You may resume regular (light) daily activities beginning the next day--such as daily self-care, walking, climbing stairs--gradually increasing activities as tolerated.  You may have sexual intercourse when it is comfortable.  Refrain from any heavy lifting or straining until approved by your doctor. a. You may drive when you are no longer taking prescription pain medication, you can comfortably wear a seatbelt, and you can safely maneuver your car and apply brakes. b. RETURN TO WORK:  __________________________________________________________ 9. You should see your doctor in the office for a follow-up appointment approximately 2-3 weeks after your surgery.  Make sure that you call for this appointment within a day or two after you arrive home to insure a convenient appointment time. 10. OTHER INSTRUCTIONS: __________________________________________________________________________________________________________________________ __________________________________________________________________________________________________________________________ WHEN TO CALL YOUR DOCTOR: 1. Fever over 101.0 2. Inability to urinate 3. Continued  bleeding from incision. 4. Increased pain, redness, or drainage from the incision. 5. Increasing abdominal pain  The clinic staff is available to answer your questions during regular business hours.  Please dont hesitate to call and ask to speak to one of the nurses for clinical concerns.  If you have a medical emergency, go to the nearest emergency room or call 911.  A surgeon from [facility] is always on call at the hospital. [address], [place], [place], [place]  [id number] ? [address], [place], [place]   [id number] [phone] ? [phone] ? FAX [phone] Web site: [email or web address]

## 2015-08-08 NOTE — Care Management Note (Signed)
Case Management Note  Patient Details  Name: Stacy Webster MRN: [id number] Date of Birth: 1961/05/04  Subjective/Objective:     Admitted with rectovaginal fistula secondary to cervical cancer, s/p Laparoscopic colostomy from descending colon               Action/Plan: Discharge planning, spoke with patient and spouse at beside. Chose [facility] for [facility] services, contacted [facility] for referral.    Expected Discharge Date:   (unknown)               Expected Discharge Plan:  [place]  In-House Referral:  NA  Discharge planning Services  CM Consult  Post Acute Care Choice:  Home Health Choice offered to:  Patient  DME Arranged:  N/A DME Agency:  NA  HH Arranged:  RN [place] Agency:  [facility]  Status of Service:  Completed, signed off  Medicare Important Message Given:    Date Medicare IM Given:    Medicare IM give by:    Date Additional Medicare IM Given:    Additional Medicare Important Message give by:     If discussed at [place] of Stay Meetings, dates discussed:    Additional Comments:  Guadalupe Maple, RN 08/08/2015, 2:21 PM [phone]

## 2015-08-08 NOTE — Consult Note (Signed)
WOC ostomy consult note Stoma type/location: LLQ Colostomy Stomal assessment/size: 1 1/8" round pink patent and producing loose brown stool Peristomal assessment: Intact, erythematous.  Stoma is flush at top and some stool was present to this skin.  Will add a barrier ring for added protection and improved seal.  Treatment options for stomal/peristomal skin: Barrier ring Output loose brown stool Ostomy pouching:2pc. 2 1/4" flat pouch with barrier ring  Education provided: Patient and friend at bedside.  Pouch change is performed.  Patient able to measure, cut pouch and apply pouching system with instruction.  Understands rationale for barrier ring.  States she has sensitive skin.  Will order CeraPlus barriers with secure start.  Encouraged to empty when 1/3 full.  Indicates she has been having a lot of output. Would like HH for a few visits to reinforce teaching.  Written step by step instruction for pouch change, barrier ring and peristomal skin care provided.  Enrolled patient in [place] program: Yes Card signed today Will not follow at this time.  Please re-consult if needed. Discharging home today per patient Domenic Moras RN BSN [facility] Pager [phone]

## 2015-08-08 NOTE — Discharge Summary (Signed)
Physician Discharge Summary  Stacy Webster  [id number]  DOB: [birth year]  DOA: 08/01/2015  PCP: Stacy Neer, MD  Admit date: 08/01/2015 Discharge date: 08/08/2015  Time spent: Greater than 30 minutes  Recommendations for Outpatient Follow-up:  1. [name], Gen. Surgery in 2-3 weeks 2. Dr. Mayra Webster, PCP in one week with repeat labs (CBC & BMP) 3. GYN oncology on 09/02/15 at 2:30 PM for labs and 3 PM for M.D. follow-up.  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  Filed Weights   08/01/15 1330  Weight: 61.689 kg (136 lb)    History of present illness:  55 y/o female with hx of metastatic cervical cancer, bilateral ureteral strictures status post stents (Dr. Junious Webster) presented with 3 day history of passing stool from her vagina. The patient has had low-grade fevers for the better part of one month up to 101.77F prior to admission. The patient presented to the cancer center and saw Stacy Webster on 08/01/2015. She was noted to have a fever up to 101.3F with tachycardia. As result, the patient was admitted for further evaluation. 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 The patient last received radiation April 2016. Patient declined percutaneous nephrostomies and urology signed off. Oncology/GYN oncology continue to follow. General surgery consulted s/p laparoscopic colostomy on 3/10. Clinically improved. Cleared for discharge by surgery  and GYN oncology.  Hospital Course:   Sepsis -Present at the time of admission--she presented with fever and tachycardia -Likely due to colitis/rectovaginal fistula/vaginitis - Lactic acid 1.0, Procalcitonin 0.23 - Blood cultures 2: Negative. Urine culture: Shows 50 K colonies of enterococcus-? Fecal contamination - Initially treated with IV fluids and empiric IV Rocephin and Flagyl. Due to persistent nausea and dry heaves, antibiotics changed to Zosyn on 3/8.  - resolved.  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 surgery was consulted and patient underwent laparoscopic colostomy on 3/10. General surgery following and patient is tolerating diet, has been educated regarding management of new colostomy and general surgeons have cleared her for discharge. WOC have seen patient.  Vesicovaginal fistula/uterine fistula, ureteral stricture- status post bilateral ureteral stents 06/21/15, & ? UTI -Follows Dr. Junious Webster -06/21/2015 fistula biopsy was benign -06/21/2015 Right ureteral stent change, new left ureteral stent - Urology input appreciated. Patient declined bilateral percutaneous nephrostomy.  Recurrent/progressive adenocarcinoma of the cervix -Drs. Stacy Webster and Stacy Webster input appreciated -Last chemotherapy 05/05/2015  - Status post inguinal lymph node biopsy 3/8-results as below - Discussed with Dr. Marko Webster today and she has cleared patient for discharge home.  Diagnosis Lymph node, needle/core biopsy, left inguinal METASTATIC CARCINOMA. 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 The neoplasm stains positive for ck8/18, ck903, focal ck20,  negative for ER, p16 , cd68 and p63.  Iron deficiency anemia/anemia of chemotherapy/chronic disease  -Baseline hemoglobin approximately 10  - Patient apparently dislikes oral iron and as per oncology, IV iron given and patient received a unit of PRBC for hemoglobin 7.5 - Stable.  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  Hypokalemia - Replaced. - Magnesium 1.8.  Discussed with patient's sister at bedside today.   Consultants:  Urology  General surgery  GYN oncology  Medical oncology  Interventional radiology  Procedures:  US guided biopsy of dominant left inguinal lymph node on 3/8 by IR..  Laparoscopic colostomy 3/10   Discharge Exam:  Complaints: Denies complaints. Tolerating regular diet. Having output through colostomy with no abdominal pain. States that she chronically has slightly blood tinged urine but does not see any fecal matter in the urine like she used to see before. No dysuria or urinary frequency. No fevers or chills. As per RN, no acute issues.  Filed Vitals:   08/08/15 0152 08/08/15 0621 08/08/15 0958 08/08/15 1328  BP: 120/62 123/67 111/63 118/65  Pulse: 73 70 91 67  Temp: 98 F (36.7 C) 97.8 F (36.6 C) 98 F (36.7 C) 98 F (36.7 C)  TempSrc: Oral Oral Oral Oral  Resp: 16 16 16 16   Height:      Weight:      SpO2: 100% 100% 100% 100%    General exam: Pleasant middle-aged female sitting up comfortably in chair eating breakfast this morning. Sister at bedside. Respiratory system: Clear. No increased work of breathing. Cardiovascular system: S1 & S2 heard, RRR. No JVD, murmurs, gallops, clicks or pedal edema. Gastrointestinal system: Abdomen is non- distended, soft & nontender. Normal  bowel sounds heard. Colostomy+ Central nervous system: Alert and oriented. No focal neurological deficits. Extremities: Symmetric 5 x 5 power.  Discharge Instructions      Discharge Instructions    Call MD for:  difficulty breathing, headache or visual disturbances    Complete by:  As directed      Call MD for:  extreme fatigue    Complete by:  As directed      Call MD for:  persistant dizziness or light-headedness    Complete by:  As directed      Call MD for:  persistant nausea and vomiting    Complete by:  As directed      Call MD for:  redness, tenderness, or signs of infection (pain, swelling, redness, odor or green/yellow discharge around incision site)    Complete by:  As directed      Call MD for:  severe uncontrolled pain    Complete by:  As directed      Call MD for:  temperature >100.4    Complete by:  As directed      Diet general    Complete by:  As directed      Increase activity slowly    Complete by:  As directed             Medication List    TAKE these medications        albuterol 108 (90 Base) MCG/ACT inhaler  Commonly known as:  PROVENTIL HFA;VENTOLIN HFA  Inhale 2 puffs into the lungs every 6 (six) hours as needed for wheezing or shortness of breath. Reported on 07/21/2015  diphenoxylate-atropine 2.5-0.025 MG tablet  Commonly known as:  LOMOTIL  Take 1 tablet by mouth as needed for diarrhea or loose stools. Reported on 07/21/2015     docusate sodium 100 MG capsule  Commonly known as:  COLACE  Take 100 mg by mouth daily as needed for mild constipation. Reported on 07/21/2015     gabapentin 100 MG capsule  Commonly known as:  NEURONTIN  Take 100 mg by mouth daily as needed (pain).     HEMOCYTE 324 (106 Fe) MG Tabs tablet  Generic drug:  Ferrous Fumarate  Take 325 mg by mouth daily. Take 1 tablet daily on empty stomach with Vitamin C tablet     ibuprofen 200 MG tablet  Commonly known as:  ADVIL,MOTRIN  Take 400 mg by mouth every 6 (six) hours  as needed. Reported on 06/23/2015     lactose free nutrition Liqd  Take 237 mLs by mouth 3 (three) times daily with meals.     LORazepam 1 MG tablet  Commonly known as:  ATIVAN  Place 1/2 - 1 tablet under the tongue or swallow evry 4-6 hrs as needed for nausea     Magnesium Oxide 500 MG Caps  Take 500 mg by mouth daily.     pantoprazole 40 MG tablet  Commonly known as:  PROTONIX  Take 1 tablet (40 mg total) by mouth daily.     potassium chloride 10 MEQ tablet  Commonly known as:  K-DUR,KLOR-CON  Take 10 mEq by mouth daily.     traMADol 50 MG tablet  Commonly known as:  ULTRAM  Take 1-2 tablets (50-100 mg total) by mouth every 8 (eight) hours as needed.     vitamin C 500 MG tablet  Commonly known as:  ASCORBIC ACID  Take 500 mg by mouth daily.       Follow-up Information    Follow up with [facility], [place].   Specialty:  General Surgery   Why:  Call and make an appointment in 2-3 weeks, sooner if you have an issue.   Contact information:   [address] [place] [address] [phone]       Follow up with Stacy Neer, MD. Schedule an appointment as soon as possible for a visit in 1 week.   Specialty:  Family Medicine   Why:  To be seen with repeat labs (CBC & BMP)   Contact information:   [address] [place] [address] [phone]       Get Medicines reviewed and adjusted: Please take all your medications with you for your next visit with your Primary MD  Please request your Primary MD to go over all hospital tests and procedure/radiological results at the follow up. Please ask your Primary MD to get all Hospital records sent to his/her office.  If you experience worsening of your admission symptoms, develop shortness of breath, life threatening emergency, suicidal or homicidal thoughts you must seek medical attention immediately by calling 911 or calling your MD immediately if symptoms less severe.  You must read complete  instructions/literature along with all the possible adverse reactions/side effects for all the Medicines you take and that have been prescribed to you. Take any new Medicines after you have completely understood and accept all the possible adverse reactions/side effects.   Do not drive when taking pain medications.   Do not take more than prescribed Pain, Sleep and Anxiety Medications  Special Instructions: If you have smoked or chewed Tobacco  in the last 2 yrs please stop smoking, stop any regular Alcohol and or any Recreational drug use.  Wear Seat belts while driving.  Please note  You were cared for by a hospitalist during your hospital stay. Once you are discharged, your primary care physician will handle any further medical issues. Please note that NO REFILLS for any discharge medications will be authorized once you are discharged, as it is imperative that you return to your primary care physician (or establish a relationship with a primary care physician if you do not have one) for your aftercare needs so that they can reassess your need for medications and monitor your lab values.    The results of significant diagnostics from this hospitalization (including imaging, microbiology, ancillary and laboratory) are listed below for reference.    Significant Diagnostic Studies: Ct Abdomen Pelvis Wo Contrast  08/01/2015  CLINICAL DATA:  Sepsis, high grade fever, passing fecal material per urethra, personal history of cervical cancer with chemotherapy and radiation as well as vaginal fistula, personal history of bilateral ureteral stents, concern for colo vesicular fistula EXAM: CT ABDOMEN AND PELVIS WITHOUT CONTRAST TECHNIQUE: Multidetector CT imaging of the abdomen and pelvis was performed following the standard protocol without IV contrast. COMPARISON:  06/09/2015 FINDINGS: Lower chest: The visualized portions of the lung bases and heart are clear. Hepatobiliary: Status post cholecystectomy.  The liver appears normal. Pancreas: Normal Spleen: Normal Adrenals/Urinary Tract: Adrenal glands are normal. Right kidney is smaller than the left. Perinephric right fluid collection unchanged. Bilateral ureteral stents present. Bladder is decompressed with wall thickening and there is air within the bladder. Stomach/Bowel: The stomach is normal. Small bowel is normal except for stable wall thickening of the distal ileum. There is significant diffuse rectal wall thickening, slightly more prominent when compared to the prior study. Wall thickening also involves nearly the entire sigmoid colon, which represents a change from prior study. Vascular/Lymphatic: No evidence of aortic dilatation. No significant retroperitoneal adenopathy. Index right inguinal lymph node previously measured 13 mm and measures 13 mm once again today. Left inguinal lymph node series 2, image number 84 is larger, measuring 16 mm on the current examination. It was not enlarged previously. On image number 78 left inguinal adenopathy measures 18 mm in diameter as opposed to 7 mm previously. Reproductive: There are unchanged calcifications involving the endometrium and are again likely related to fibroids. When compared to the prior study there is increased air within the endometrial canal. There is also again air in the region of the vaginal fornices and cervical canal. Oral contrast is also seen into the endocervical canal. Seen best on sagittal images, there is a thin tract of contrast extending anteriorly from the cervix to the bladder. This is seen best on sagittal series image number 60. Stable bilateral ovarian cysts or follicles. Other: No significant ascites Musculoskeletal: No acute musculoskeletal findings IMPRESSION: 1.  Increased inguinal adenopathy 2.  Stable radiation enteritis 3. Stable right perinephric fluid and ureteral stents. No hydronephrosis. 4. The presence of abnormal air is noted within the endometrium and within the  bladder, implying the possibility of infection and also of fistula to both structures. On this examination which was performed with oral contrast only, the presence of contrast within the cervical canal confirms known rectovaginal fistula. Seen best on the sagittal images, there also appears to be fistula extending anteriorly from the cervical region to the bladder. 5. Increased wall thickening of the rectum and most of the sigmoid colon, suggesting possibility of  radiation colitis. Electronically Signed   By: Skipper Cliche M.D.   On: 08/01/2015 17:33   Dg Chest 2 View  08/01/2015  CLINICAL DATA:  Fever for 3 weeks getting progressively worse with weakness EXAM: CHEST  2 VIEW COMPARISON:  03/23/2015 FINDINGS: The heart size and mediastinal contours are within normal limits. Both lungs are clear. The visualized skeletal structures are unremarkable. Lordotic positioning and mild rotation appears to cause subtle prominence of the ascending aorta. IMPRESSION: No active cardiopulmonary disease. Electronically Signed   By: Skipper Cliche M.D.   On: 08/01/2015 14:03   US Biopsy  08/03/2015  INDICATION: History of cervical cancer, now with bilateral inguinal lymphadenopathy. Previous from ultrasound-guided biopsy for tissue diagnostic purposes. EXAM: ULTRASOUND BIOPSY CORE LIVER COMPARISON:  CT abdomen pelvis -08/01/2015 MEDICATIONS: None ANESTHESIA/SEDATION: Moderate (conscious) sedation was employed during this procedure. A total of Versed 1.5 mg and Fentanyl 75 mcg was administered intravenously. Moderate Sedation Time: 10 minutes. The patient's level of consciousness and vital signs were monitored continuously by radiology nursing throughout the procedure under my direct supervision. COMPLICATIONS: None immediate. TECHNIQUE: Informed written consent was obtained from the patient after a discussion of the risks, benefits and alternatives to treatment. Questions regarding the procedure were encouraged and answered.  Initial ultrasound scanning demonstrated an enlarged approximately 1.7 x 3.4 cm left inguinal lymph node which correlates with the dominant lymph node seen on prior CT of the abdomen pelvis (image 78, series 2). An ultrasound image was saved for documentation purposes. The procedure was planned. A timeout was performed prior to the initiation of the procedure. The operative was prepped and draped in the usual sterile fashion, and a sterile drape was applied covering the operative field. A timeout was performed prior to the initiation of the procedure. Local anesthesia was provided with 1% lidocaine with epinephrine. Under direct ultrasound guidance, an 18 gauge core needle device was utilized to obtain to obtain 5 core needle biopsied of the dominant left inguinal lymph node. The samples were placed in saline and submitted to pathology. The needle was removed and hemostasis was achieved with manual compression. Post procedure scan was negative for significant hematoma. A dressing was placed. The patient tolerated the procedure well without immediate postprocedural complication. IMPRESSION: Technically successful ultrasound guided left inguinal lymph node biopsy. Electronically Signed   By: Sandi Mariscal M.D.   On: 08/03/2015 17:53   Dg Colon W/water Sol Cm  08/04/2015  CLINICAL DATA:  History a cervical cancer. Evaluate for rectovaginal an and recto vesicle fistula EXAM: COLON WITH WATER SOLUTION CONTRAST COMPARISON:  CT from 08/01/2015. FINDINGS: On the scout radiograph bilateral nephro ureteral stents are identified. Enteric contrast material is identified within the colon up to the mid descending colon. Absence of contrast from within the distal colon, rectum, and vagina is noted. Enema tip was placed into the rectum. This when directly through a large defect within the ventral wall of the rectum into the vagina. Under gravity water-soluble contrast material was infused via the enema tube. Initially, this  opacified the vagina. Reflux into the rectum via the rectovaginal fistula was identified as well as opacification of the urinary bladder via a fistula between the anterior wall of the vagina and the posterior wall of bladder. IMPRESSION: 1. 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 2. These results were called by telephone at the time of interpretation on 08/04/2015 at 3:45 pm to Dr. Barry Dienes , who verbally acknowledged these results. Electronically Signed  By: Kerby Moors M.D.   On: 08/04/2015 15:46    Microbiology: Recent Results (from the past 240 hour(s))  Culture, Blood     Status: None   Collection Time: 08/01/15 10:20 AM  Result Value Ref Range Status   BLOOD CULTURE, ROUTINE Final report  Final   RESULT 1 Comment  Final    Comment: No aerobic or anaerobic growth in five days.  Culture, Blood     Status: None   Collection Time: 08/01/15 10:51 AM  Result Value Ref Range Status   BLOOD CULTURE, ROUTINE Final report  Final   RESULT 1 Comment  Final    Comment: No aerobic or anaerobic growth in five days.  Culture, blood (routine x 2)     Status: None   Collection Time: 08/01/15 10:04 PM  Result Value Ref Range Status   Specimen Description BLOOD RIGHT HAND  Final   Special Requests BOTTLES DRAWN AEROBIC AND ANAEROBIC 6 CC  Final   Culture   Final    NO GROWTH 5 DAYS Performed at [facility]    Report Status 08/07/2015 FINAL  Final  Culture, blood (routine x 2)     Status: None   Collection Time: 08/01/15 10:08 PM  Result Value Ref Range Status   Specimen Description BLOOD LEFT HAND  Final   Special Requests BOTTLES DRAWN AEROBIC AND ANAEROBIC 6 ML  Final   Culture   Final    NO GROWTH 5 DAYS Performed at [facility]    Report Status 08/07/2015 FINAL  Final  Urine culture     Status: None   Collection Time: 08/02/15 11:03 AM  Result Value Ref Range Status   Specimen Description URINE, CLEAN CATCH  Final    Special Requests NONE  Final   Culture   Final    50,000 COLONIES/mL ENTEROCOCCUS SPECIES Performed at [facility]    Report Status 08/04/2015 FINAL  Final   Organism ID, Bacteria ENTEROCOCCUS SPECIES  Final      Susceptibility   Enterococcus species - MIC*    AMPICILLIN <=2 SENSITIVE Sensitive     LEVOFLOXACIN 1 SENSITIVE Sensitive     NITROFURANTOIN <=16 SENSITIVE Sensitive     VANCOMYCIN 1 SENSITIVE Sensitive     * 50,000 COLONIES/mL ENTEROCOCCUS SPECIES  Surgical pcr screen     Status: None   Collection Time: 08/04/15  8:03 PM  Result Value Ref Range Status   MRSA, PCR NEGATIVE NEGATIVE Final   Staphylococcus aureus NEGATIVE NEGATIVE Final    Comment:        The Xpert SA Assay (FDA approved for NASAL specimens in patients over 20 years of age), is one component of a comprehensive surveillance program.  Test performance has been validated by [facility] for patients greater than or equal to 23 year old. It is not intended to diagnose infection nor to guide or monitor treatment.      Labs: Basic Metabolic Panel:  Recent Labs Lab 08/02/15 0107 08/03/15 0814 08/04/15 0333 08/05/15 0320 08/06/15 0524  NA 131* 133* 138 138 137  K 3.2* 3.4* 3.2* 4.1 4.0  CL 97* 99* 102 100* 102  CO2 26 25 26 26 26   GLUCOSE 112* 92 91 90 104*  BUN 13 11 10 8 10   CREATININE 0.81 0.79 0.86 1.01* 0.95  CALCIUM 7.6* 8.0* 8.1* 7.6* 8.1*  MG  --  1.8  --   --   --    Liver Function Tests:  No results for input(s): AST, ALT, ALKPHOS, BILITOT, PROT, ALBUMIN in the last 168 hours. No results for input(s): LIPASE, AMYLASE in the last 168 hours. No results for input(s): AMMONIA in the last 168 hours. CBC:  Recent Labs Lab 08/02/15 0107 08/03/15 0814 08/04/15 0333 08/05/15 0320 08/06/15 0524  WBC 18.4* 11.9* 9.0 8.8 10.6*  HGB 7.5* 9.5* 8.8* 8.4* 9.0*  HCT 23.5* 30.3* 27.5* 26.2* 29.3*  MCV 89.4 90.4 89.9 87.3 93.0  PLT 496* 506* 525* 520* 599*   Cardiac Enzymes: No  results for input(s): CKTOTAL, CKMB, CKMBINDEX, TROPONINI in the last 168 hours. BNP: BNP (last 3 results) No results for input(s): BNP in the last 8760 hours.  ProBNP (last 3 results) No results for input(s): PROBNP in the last 8760 hours.  CBG: No results for input(s): GLUCAP in the last 168 hours.     Signed:  Vernell Leep, MD, FACP, FHM. [facility] Pager [phone]  If 7PM-7AM, please contact night-coverage [email or web address] Password [email or web address] 08/08/2015, 1:52 PM

## 2015-08-08 NOTE — Progress Notes (Signed)
3 Days Post-Op  Subjective: She looks great, she is handling her colostomy without issue, it has been changed this AM so there is nothing in the bag now.  Tolerating her diet.    Objective: Vital signs in last 24 hours: Temp:  [97.7 F (36.5 C)-98 F (36.7 C)] 98 F (36.7 C) (03/13 0958) Pulse Rate:  [57-91] 91 (03/13 0958) Resp:  [16-18] 16 (03/13 0958) BP: (111-132)/(61-83) 111/63 mmHg (03/13 0958) SpO2:  [94 %-100 %] 100 % (03/13 0958) Last BM Date: 08/07/15 PO not recorded  Regular diet 150 stool recorded Afebrile, VSS No labs today, last labs 08/06/15 Intake/Output from previous day: 03/12 0701 - 03/13 0700 In: 1378.8 [I.V.:1228.8; IV Piggyback:150] Out: 150 [Stool:150] Intake/Output this shift: Total I/O In: -  Out: 350 [Stool:350]  General appearance: alert, cooperative and no distress GI: soft, sore, sites all look fine, colostomy looks fine.    Lab Results:   Recent Labs  08/06/15 0524  WBC 10.6*  HGB 9.0*  HCT 29.3*  PLT 599*    BMET  Recent Labs  08/06/15 0524  NA 137  K 4.0  CL 102  CO2 26  GLUCOSE 104*  BUN 10  CREATININE 0.95  CALCIUM 8.1*   PT/INR No results for input(s): LABPROT, INR in the last 72 hours.  No results for input(s): AST, ALT, ALKPHOS, BILITOT, PROT, ALBUMIN in the last 168 hours.   Lipase  No results found for: LIPASE   Studies/Results: No results found.  Medications: . enoxaparin (LOVENOX) injection  40 mg Subcutaneous Q24H  . lactose free nutrition  237 mL Oral TID WC  . lactose free nutrition  237 mL Oral TID BM  . magnesium oxide  400 mg Oral Daily  . pantoprazole  40 mg Oral QHS  . piperacillin-tazobactam (ZOSYN)  IV  3.375 g Intravenous Q8H    Assessment/Plan Recurrent cervical cancer with metastasis  New rectovaginal fistula Known vesicovaginal fistula S/p Laparoscopic colostomy from descending colon, 08/05/15, Dr. Barry Dienes Bilateral ureteral strictures with bilateral stents, [name] Chemotherapy  and radiation therapy (last treatment in Dec 2016) Sepsis/UTI Anemia - transfused this Admission 08/03/15 Malnutrition 10 lbs last month, and 54 pounds last year. Antibiotics: day 2 Ceftriaxone and Flagyl; converted to Zosyn Now day 2 (antibiotics 4 days total) DVT: Lovenox/SCD    Plan:  From our standpoint she can go home.  I will put follow up and instruction in the AVS.   LOS: 7 days    Stacy Webster 08/08/2015

## 2015-08-08 NOTE — Progress Notes (Signed)
08/08/15 1450: Discharge instructions reviewed with patient. Questions answered. Donne Hazel, RN

## 2015-08-09 ENCOUNTER — Telehealth: Payer: Self-pay | Admitting: Gynecologic Oncology

## 2015-08-09 NOTE — Telephone Encounter (Signed)
Returned call to patient.  Patient had called earlier asking about biopsy results.  Informed of biopsy results.  Advised she would receive a phone call with further recommendations on Thurs or Friday of this week when Dr. [name] returns from her conference.  No concerns voiced.  Advised to call for any needs between that time.

## 2015-08-10 ENCOUNTER — Encounter: Payer: Self-pay | Admitting: Oncology

## 2015-08-10 DIAGNOSIS — K219 Gastro-esophageal reflux disease without esophagitis: Secondary | ICD-10-CM

## 2015-08-10 DIAGNOSIS — E876 Hypokalemia: Secondary | ICD-10-CM

## 2015-08-11 ENCOUNTER — Encounter: Payer: Self-pay | Admitting: Oncology

## 2015-08-11 MED ORDER — POTASSIUM CHLORIDE ER 10 MEQ PO TBCR: 10.0000 meq | EXTENDED_RELEASE_TABLET | Freq: Every day | ORAL | Status: DC

## 2015-08-11 MED ORDER — PANTOPRAZOLE SODIUM 40 MG PO TBEC: 40.0000 mg | DELAYED_RELEASE_TABLET | Freq: Every evening | ORAL | Status: DC

## 2015-08-11 NOTE — Telephone Encounter (Signed)
Pt needs refills on KCL and Protonix.  Sent in prescriptions to [facility]

## 2015-08-11 NOTE — Telephone Encounter (Signed)
Spoke with [name] and told her that [name] wanted to know the Name of the person to whom  the letter needs to be faxed. Ms. Felt will call Dr. Mariana Kaufman office tomorrow and provide this information.

## 2015-08-12 NOTE — Telephone Encounter (Signed)
Faxed signed Leave Extension Letter to Attention Benefits Office at [phone] as requested by [name].

## 2015-08-16 ENCOUNTER — Telehealth: Payer: Self-pay | Admitting: Oncology

## 2015-08-16 ENCOUNTER — Other Ambulatory Visit: Payer: Self-pay | Admitting: Oncology

## 2015-08-16 NOTE — Telephone Encounter (Signed)
Spoke with patient to confirm appt 4/3 per LL 3/21 pof

## 2015-08-17 ENCOUNTER — Other Ambulatory Visit (HOSPITAL_BASED_OUTPATIENT_CLINIC_OR_DEPARTMENT_OTHER): Payer: BC Managed Care – PPO

## 2015-08-17 ENCOUNTER — Ambulatory Visit: Payer: BC Managed Care – PPO | Admitting: Gynecologic Oncology

## 2015-08-17 ENCOUNTER — Other Ambulatory Visit: Payer: BC Managed Care – PPO

## 2015-08-17 ENCOUNTER — Ambulatory Visit: Payer: BC Managed Care – PPO | Attending: Gynecologic Oncology | Admitting: Gynecologic Oncology

## 2015-08-17 ENCOUNTER — Encounter: Payer: Self-pay | Admitting: Gynecologic Oncology

## 2015-08-17 VITALS — BP 128/74 | HR 118 | Temp 98.7°F | Resp 18 | Ht 64.0 in | Wt 134.8 lb

## 2015-08-17 DIAGNOSIS — T451X5A Adverse effect of antineoplastic and immunosuppressive drugs, initial encounter: Secondary | ICD-10-CM | POA: Insufficient documentation

## 2015-08-17 DIAGNOSIS — R350 Frequency of micturition: Secondary | ICD-10-CM | POA: Diagnosis not present

## 2015-08-17 DIAGNOSIS — C530 Malignant neoplasm of endocervix: Secondary | ICD-10-CM

## 2015-08-17 DIAGNOSIS — Z9221 Personal history of antineoplastic chemotherapy: Secondary | ICD-10-CM

## 2015-08-17 DIAGNOSIS — R69 Illness, unspecified: Secondary | ICD-10-CM | POA: Insufficient documentation

## 2015-08-17 DIAGNOSIS — N135 Crossing vessel and stricture of ureter without hydronephrosis: Secondary | ICD-10-CM | POA: Diagnosis not present

## 2015-08-17 DIAGNOSIS — Z923 Personal history of irradiation: Secondary | ICD-10-CM

## 2015-08-17 DIAGNOSIS — R000 Tachycardia, unspecified: Secondary | ICD-10-CM | POA: Diagnosis not present

## 2015-08-17 DIAGNOSIS — K5909 Other constipation: Secondary | ICD-10-CM | POA: Insufficient documentation

## 2015-08-17 DIAGNOSIS — E43 Unspecified severe protein-calorie malnutrition: Secondary | ICD-10-CM | POA: Diagnosis not present

## 2015-08-17 DIAGNOSIS — N1330 Unspecified hydronephrosis: Secondary | ICD-10-CM | POA: Diagnosis not present

## 2015-08-17 DIAGNOSIS — C539 Malignant neoplasm of cervix uteri, unspecified: Secondary | ICD-10-CM | POA: Diagnosis not present

## 2015-08-17 DIAGNOSIS — C7989 Secondary malignant neoplasm of other specified sites: Secondary | ICD-10-CM

## 2015-08-17 DIAGNOSIS — C786 Secondary malignant neoplasm of retroperitoneum and peritoneum: Secondary | ICD-10-CM | POA: Diagnosis not present

## 2015-08-17 DIAGNOSIS — R5383 Other fatigue: Secondary | ICD-10-CM

## 2015-08-17 DIAGNOSIS — C774 Secondary and unspecified malignant neoplasm of inguinal and lower limb lymph nodes: Secondary | ICD-10-CM | POA: Diagnosis not present

## 2015-08-17 DIAGNOSIS — K219 Gastro-esophageal reflux disease without esophagitis: Secondary | ICD-10-CM | POA: Diagnosis not present

## 2015-08-17 DIAGNOSIS — G620 Drug-induced polyneuropathy: Secondary | ICD-10-CM | POA: Diagnosis not present

## 2015-08-17 LAB — CBC WITH DIFFERENTIAL/PLATELET
BASO%: 0.7 % (ref 0.0–2.0)
BASOS ABS: 0.1 10*3/uL (ref 0.0–0.1)
EOS%: 0.3 % (ref 0.0–7.0)
Eosinophils Absolute: 0 10*3/uL (ref 0.0–0.5)
HEMATOCRIT: 33.7 % — AB (ref 34.8–46.6)
HGB: 10.8 g/dL — ABNORMAL LOW (ref 11.6–15.9)
LYMPH#: 0.6 10*3/uL — AB (ref 0.9–3.3)
LYMPH%: 4.9 % — AB (ref 14.0–49.7)
MCH: 29.3 pg (ref 25.1–34.0)
MCHC: 32 g/dL (ref 31.5–36.0)
MCV: 91.7 fL (ref 79.5–101.0)
MONO#: 1.2 10*3/uL — ABNORMAL HIGH (ref 0.1–0.9)
MONO%: 9.1 % (ref 0.0–14.0)
NEUT#: 10.8 10*3/uL — ABNORMAL HIGH (ref 1.5–6.5)
NEUT%: 85 % — AB (ref 38.4–76.8)
Platelets: 586 10*3/uL — ABNORMAL HIGH (ref 145–400)
RBC: 3.67 10*6/uL — ABNORMAL LOW (ref 3.70–5.45)
RDW: 23.2 % — ABNORMAL HIGH (ref 11.2–14.5)
WBC: 12.7 10*3/uL — ABNORMAL HIGH (ref 3.9–10.3)

## 2015-08-17 LAB — COMPREHENSIVE METABOLIC PANEL
ALT: <9 U/L (ref 0–55)
ANION GAP: 9 meq/L (ref 3–11)
AST: 12 U/L (ref 5–34)
Albumin: 2.3 g/dL — ABNORMAL LOW (ref 3.5–5.0)
Alkaline Phosphatase: 58 U/L (ref 40–150)
BUN: 18.8 mg/dL (ref 7.0–26.0)
CALCIUM: 8.7 mg/dL (ref 8.4–10.4)
CHLORIDE: 96 meq/L — AB (ref 98–109)
CO2: 29 meq/L (ref 22–29)
CREATININE: 0.9 mg/dL (ref 0.6–1.1)
EGFR: 76 mL/min/{1.73_m2} — ABNORMAL LOW (ref >90)
Glucose: 117 mg/dl (ref 70–140)
POTASSIUM: 3.6 meq/L (ref 3.5–5.1)
Sodium: 134 mEq/L — ABNORMAL LOW (ref 136–145)
Total Bilirubin: <0.3 mg/dL (ref 0.20–1.20)
Total Protein: 6.4 g/dL (ref 6.4–8.3)

## 2015-08-17 NOTE — Progress Notes (Signed)
Followup Patient Note: [name]  CC:  Chief Complaint  Patient presents with  . Cervical Cancer    follow up  progression of disease  Assessment/Plan:  Stacy Webster  is a 55 y.o.  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 Treated with diverting descending colostomy.  New recurrence documented in bilateral inguinal nodes.   Discussed the poor prognosis of her disease. Discussed anticipated life expectancy of less than one year. Offered no further treatment (focus on palliative care). Patient feels strongly that she desires aggressive intervention and desires 3rd line chemotherapy.  Will discuss options with Dr Marko Plume. Will have her original biopsy from [place] reviewed by our pathologists. If this is a primary endometrial cancer she may benefit from consideration of chemotherapeutic options viable for endoemtrial cancer (eg doxil). An alternative might be considering topotecan, though I have concerns about bone marrow reserves in a pretreated patient s/p radiation.  Symptoms (tachycardia, fatigue) concerning for dehydration, but able to take in PO at present. Patient will notify us if these worsen over the next 2 days and she will be considered for IVF.   Severe malnutrition (albumin <2.5). Counseled regarding nutritional supplementation. Likely some of the etiology is tumor cachexia and protein wasting.   HPI: Stacy Webster is a 55 year old woman with a history of stage II B cervical adenocarcinoma. She was diagnosed with this condition in December of 2015. She was seen initially by Dr Genia Del in  [place]. At the time of diagnosis she had positive lymph nodes in the pelvis on CT imaging. She was treated with primary chemo-radiation which was completed on April 8th. The patient reports completing the course of therapy on schedule with no delays.  She reports that when she completed therapy there was some concern by her treating physicians that her response had been incomplete. They had planned to wait until she was further out from therapy before obtaining a PET scan to evaluate for response.   In May, 2016 she developed febrile morbidity and manifestionas of sepsis. She presented to the [facility] ER and was admitted and noted to have a urinary tract infection and right ureteral obstruction which was new. She was treated with pressor support, IV antibiotics, and a right perinephric drain for the right perinephric abscess.   On June 9th, 2016 she underwent PET/CT to evaluate for recurrent disease as the cause of her new right ureteral obstruction (CT had not clearly shown recurrence/progression). It demonstrated:  There is irregular soft tissue thickening along the posterior wall of the cecum which is hypermetabolic. SUV max is 6.8. This is worrisome for tumor infiltration. There are also metabolically active small mesenteric lymph nodes near the right colon (largest node measures 7 mm). The uterus remains enlarged with marked dilatation of the endometrial canal which is filled with fluid. This is likely due to cervical obstruction from the patient's cervical cancer. Several foci of hypermetabolism are noted in the myometrium. Could not exclude tumor implants. This could be residual tumor or radiation change. There are small pelvic sidewall lymph nodes but they are not hypermetabolic. There are borderline enlarged inguinal lymph nodes which are hypermetabolic.   She was started on salvage chemotherapy with cisplatin,  paclitaxel and bevacizumab on 11/19/14.   CT scan of the chest abdomen and  pelvis performed on 02/21/2015 revealed no new lesions, mild prominence and enhancement of the cervix consistent with treatment effect, small residual ileocolic and right abdominal mesenteric lymph nodes measuring 4-6 mm mildly improved. No distant metastasis. Right renal atrophy with resolving subcapsular seroma and hematoma.  She was admitted to [facility] hospital with sepsis between 03/23/15 and 03/27/15 and diagnosed with a pyometrium which we evacuated with D&C in the OR (biopsies of cervix were benign). After discharge she received cycle 6 of therapy (without avastin), and then continued to have fevers. She was treated with a course of amoxicillin and flagyl.   She has completed 7 cycles of cddp/taxol/bev on 05/04/15. Post treatment imaging showed no measurable disease.   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 She was offered PCN tubes, however declined these.  Interval History:   In February 2017 she developed leakage of stool from her vagina and sepsis. 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 She received a diverting descending end colostomy on 08/05/15.  During that hospitalization, imaging revealed bilateral inguinal lymphadenopathy that was new. On exam it felt firm and fixed. Biopsy confirmed metastatic adenocarcinoma, "favor the carcinoma is metastaticv endometrial carcinoma"  Due to positive ck8/18, CK903, focal ck20, negative ER and p16 and p63.  Since discharge she has had 2 days of loose stools and diarrhea. She has felt profoundly fatigued. She is no longer having diarrhea. She is managing her stoma well. She is still leaking urine but now it is mixed with some blood.   Current Meds:  Outpatient Encounter Prescriptions as of 08/17/2015  Medication Sig  . HEMOCYTE 324 (106 FE) MG TABS Take 325 mg by mouth daily. Take 1 tablet daily on empty stomach with Vitamin  C tablet  . lactose free nutrition (BOOST PLUS) LIQD Take 237 mLs by mouth 3 (three) times daily with meals.  Marland Kitchen LORazepam (ATIVAN) 1 MG tablet Place 1/2 - 1 tablet under the tongue or swallow evry 4-6 hrs as needed for nausea  . Magnesium Oxide 500 MG CAPS Take 500 mg by mouth daily.  . pantoprazole (PROTONIX) 40 MG tablet Take 1 tablet (40 mg total) by mouth every evening.  . potassium chloride (K-DUR) 10 MEQ tablet Take 1 tablet (10 mEq total) by mouth daily.  . traMADol (ULTRAM) 50 MG tablet Take 1-2 tablets (50-100 mg total) by mouth every 8 (eight) hours as needed.  . vitamin C (ASCORBIC ACID) 500 MG tablet Take 500 mg by mouth daily.  Marland Kitchen albuterol (PROVENTIL HFA;VENTOLIN HFA) 108 (90 BASE) MCG/ACT inhaler Inhale 2 puffs into the lungs every 6 (six) hours as needed for wheezing or shortness of breath. Reported on 08/17/2015  . diphenoxylate-atropine (LOMOTIL) 2.5-0.025 MG per tablet Take 1 tablet by mouth as needed for diarrhea or loose stools. Reported on 08/17/2015  . docusate sodium (COLACE) 100 MG capsule Take 100 mg by mouth daily as needed for mild constipation. Reported on 08/17/2015  . gabapentin (NEURONTIN) 100 MG capsule Take 100 mg by mouth daily as needed (pain). Reported on 08/17/2015  . ibuprofen (ADVIL,MOTRIN) 200 MG tablet Take 400 mg by mouth every 6 (six) hours as needed. Reported on 08/17/2015   No facility-administered encounter medications on file as of 08/17/2015.    Allergy:  Allergies  Allergen Reactions  . Morphine And Related Rash  Per pt, morphine also makes her vomit  . Zofran [Ondansetron Hcl] Rash    Veins with erythema after zofran infused.  . Codeine Nausea Only  . Hydrocodone Nausea And Vomiting  . Peach [Prunus Persica] Swelling and Rash    Social Hx:   Social History   Social History  . Marital Status: Married    Spouse Name: N/A  . Number of Children: N/A  . Years of Education: N/A   Occupational History  . Not on file.   Social History  Main Topics  . Smoking status: Never Smoker   . Smokeless tobacco: Never Used  . Alcohol Use: No  . Drug Use: No  . Sexual Activity: Not on file   Other Topics Concern  . Not on file   Social History Narrative    Past Surgical Hx:  Past Surgical History  Procedure Laterality Date  . Cystoscopy w/ ureteral stent placement Right 10/13/2014    Procedure: CYSTOSCOPY WITH RETROGRADE PYELOGRAM/URETERAL STENT PLACEMENT;  Surgeon: Festus Aloe, MD;  Location: [facility];  Service: Urology;  Laterality: Right;  . Transthoracic echocardiogram  10-13-2014    mild LVH,  ef 55-60%/  mild TR  . Tonsillectomy and adenoidectomy  age 16    age 18--  redo-- Adenoidectomy  . Laparoscopic cholecystectomy  1990's  . Cystoscopy with ureteroscopy and stent placement Right 02/18/2015    Procedure: CYSTO WITH RIGHT URETERAL STENT EXCHANGE, RIGHT URETEROSCOPY ;  Surgeon: Festus Aloe, MD;  Location[facility];  Service: Urology;  Laterality: Right;  . Cystoscopy with biopsy N/A 02/18/2015    Procedure: CYSTOSCOPY WITH  BLADDER  BIOPSY;  Surgeon: Festus Aloe, MD;  Location: [facility];  Service: Urology;  Laterality: N/A;  . Cystoscopy w/ retrogrades Right 02/18/2015    Procedure: CYSTOSCOPY WITH RETROGRADE PYELOGRAM;  Surgeon: Festus Aloe, MD;  Location: [facility];  Service: Urology;  Laterality: Right;  . Dilation and curettage of uterus N/A 03/25/2015    Procedure: DILATATION AND CURETTAGE WITH ULTRASOUND;  Surgeon: Everitt Amber, MD;  Location: [facility];  Service: Gynecology;  Laterality: N/A;  . Cystoscopy/retrograde/ureteroscopy Bilateral 06/21/2015    Procedure: CYSTOSCOPY; EXAM UNDER ANESTHESIA; BLADDER BIOPSIES WITH FULGARATION; BILATERAL RETROGRADE PYLOGRAM; RIGHT URETERAL STENT EXCHANGE AND LEFT URETERAL STENT PLACEMENT;  Surgeon: Festus Aloe, MD;  Location: [facility];  Service: Urology;  Laterality: Bilateral;  . Laparotomy  N/A 08/05/2015    Procedure: LAPAROSCOPY AND DIVERTING OSTOMY;  Surgeon: Stark Klein, MD;  Location: [facility];  Service: General;  Laterality: N/A;    Past Medical Hx:  Past Medical History  Diagnosis Date  . Hydronephrosis, right   . Ureteral stricture, right   . History of septic shock     05/ 2016  due to Peptostriptococcus/  right hydronephrosis and obstruction----  resolved  . 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 Mild intermittent asthma   . History of Clostridium difficile     10-15-2014  resolved  . Left rotator cuff tear arthropathy   . White coat syndrome without hypertension   . GERD (gastroesophageal reflux disease)   . Frequency of urination   . Anemia associated with chemotherapy     Antineoplastic chemo  . Recurrent cervical cancer [facility]) oncologist-  dr Autumn Gunn/ dr Marko Plume    dx 12/ 2015---  Radiation and Chemo (07-06-2014 to 09-03-2014)  now recurrent ,  Stage IIB  w/ Mets to pelvic/ retroperitoneum---  CHEMOTHERAPY CURRENTLY  .  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 Cervical cancer ([place])   . Chronic constipation   . Urinary incontinence   . PONV (postoperative nausea and vomiting)     remote history -not recent    Past Gynecological History:  Cervical cancer 2015. Preceding this the patient has no prior history of abnormal pap smears, and had been up to date with cervical cancer screening.  Patient's last menstrual period was 11/05/2011.  Family Hx: History reviewed. No pertinent family history.  Review of Systems:  Constitutional   fatigued  ENT Normal appearing ears and nares bilaterally Skin/Breast  No rash, sores, jaundice, itching, dryness Cardiovascular  No chest pain, shortness of breath, or edema  Pulmonary  No cough or wheeze.  Gastro Intestinal  No longer having diarrhea. Genito Urinary  Constant leakage of urine vaginally,  see HPI Musculo Skeletal  No myalgia, arthralgia, joint swelling or pain  Neurologic  No  weakness, numbness, change in gait,  Psychology  No depression, anxiety, insomnia.   Vitals:  Blood pressure 128/74, pulse 118, temperature 98.7 F (37.1 C), temperature source Oral, resp. rate 18, height 5\' 4"  (1.626 m), weight 134 lb 12.8 oz (61.145 kg), last menstrual period 11/05/2011, SpO2 100 %.  Physical Exam: General: no distress, appears pale and fatigued Pulm: CTAB CVS: RRR, no murmur Abd: soft, nondistended, nonpainful Lymph: + bilateral inguinal adenopathy (left > right) Back : no cva tenderness Genitourinary: deferred   Donaciano Eva, MD  08/17/2015, 4:29 PM

## 2015-08-17 NOTE — Patient Instructions (Signed)
See Dr Marko Plume as scheduled , Dr Marko Plume will notify GYN/ONC for follow up appointment with Dr Everitt Amber . Please call us by Friday August 19, 2015 for IVF. Please call with any questions or concerns.  Thank you!

## 2015-08-18 ENCOUNTER — Encounter: Payer: Self-pay | Admitting: Gynecologic Oncology

## 2015-08-18 NOTE — Progress Notes (Signed)
Per Dr. [name]: Request has been made with [facility] for Dr. Lyndon Code to review the lymph node biopsy from 08/03/15 [phone]) and the endo bx from 05/25/14 ([id number]) compared to the cervical biopsy from [place] on 06/03/14 ([id number]) to see if the primary is cervical or endometrial.  Request placed with Maudie Mercury at [facility].

## 2015-08-20 ENCOUNTER — Other Ambulatory Visit: Payer: Self-pay | Admitting: Oncology

## 2015-08-24 ENCOUNTER — Other Ambulatory Visit: Payer: Self-pay | Admitting: Gynecologic Oncology

## 2015-08-26 ENCOUNTER — Other Ambulatory Visit: Payer: Self-pay | Admitting: General Surgery

## 2015-08-28 ENCOUNTER — Other Ambulatory Visit: Payer: Self-pay | Admitting: Oncology

## 2015-08-29 ENCOUNTER — Encounter (HOSPITAL_BASED_OUTPATIENT_CLINIC_OR_DEPARTMENT_OTHER): Payer: Self-pay | Admitting: *Deleted

## 2015-08-29 ENCOUNTER — Encounter: Payer: Self-pay | Admitting: Oncology

## 2015-08-29 ENCOUNTER — Other Ambulatory Visit (HOSPITAL_BASED_OUTPATIENT_CLINIC_OR_DEPARTMENT_OTHER): Payer: BC Managed Care – PPO

## 2015-08-29 ENCOUNTER — Other Ambulatory Visit: Payer: Self-pay

## 2015-08-29 ENCOUNTER — Telehealth: Payer: Self-pay | Admitting: Oncology

## 2015-08-29 ENCOUNTER — Telehealth: Payer: Self-pay

## 2015-08-29 ENCOUNTER — Ambulatory Visit (HOSPITAL_BASED_OUTPATIENT_CLINIC_OR_DEPARTMENT_OTHER): Payer: BC Managed Care – PPO | Admitting: Oncology

## 2015-08-29 VITALS — BP 116/73 | HR 112 | Temp 98.5°F | Resp 18 | Ht 64.0 in | Wt 140.0 lb

## 2015-08-29 DIAGNOSIS — C774 Secondary and unspecified malignant neoplasm of inguinal and lower limb lymph nodes: Secondary | ICD-10-CM

## 2015-08-29 DIAGNOSIS — G620 Drug-induced polyneuropathy: Secondary | ICD-10-CM | POA: Diagnosis not present

## 2015-08-29 DIAGNOSIS — C539 Malignant neoplasm of cervix uteri, unspecified: Secondary | ICD-10-CM

## 2015-08-29 DIAGNOSIS — C7989 Secondary malignant neoplasm of other specified sites: Secondary | ICD-10-CM

## 2015-08-29 DIAGNOSIS — C530 Malignant neoplasm of endocervix: Secondary | ICD-10-CM | POA: Diagnosis not present

## 2015-08-29 DIAGNOSIS — N824 Other female intestinal-genital tract fistulae: Secondary | ICD-10-CM

## 2015-08-29 DIAGNOSIS — E46 Unspecified protein-calorie malnutrition: Secondary | ICD-10-CM

## 2015-08-29 DIAGNOSIS — C541 Malignant neoplasm of endometrium: Secondary | ICD-10-CM

## 2015-08-29 DIAGNOSIS — Z933 Colostomy status: Secondary | ICD-10-CM

## 2015-08-29 DIAGNOSIS — T451X5A Adverse effect of antineoplastic and immunosuppressive drugs, initial encounter: Secondary | ICD-10-CM

## 2015-08-29 DIAGNOSIS — D500 Iron deficiency anemia secondary to blood loss (chronic): Secondary | ICD-10-CM | POA: Diagnosis not present

## 2015-08-29 DIAGNOSIS — I878 Other specified disorders of veins: Secondary | ICD-10-CM

## 2015-08-29 DIAGNOSIS — R634 Abnormal weight loss: Secondary | ICD-10-CM

## 2015-08-29 DIAGNOSIS — N820 Vesicovaginal fistula: Secondary | ICD-10-CM

## 2015-08-29 DIAGNOSIS — E8809 Other disorders of plasma-protein metabolism, not elsewhere classified: Secondary | ICD-10-CM

## 2015-08-29 LAB — CBC WITH DIFFERENTIAL/PLATELET
BASO%: 0.2 % (ref 0.0–2.0)
Basophils Absolute: 0 10*3/uL (ref 0.0–0.1)
EOS%: 1.3 % (ref 0.0–7.0)
Eosinophils Absolute: 0.2 10*3/uL (ref 0.0–0.5)
HEMATOCRIT: 30.6 % — AB (ref 34.8–46.6)
HEMOGLOBIN: 9.7 g/dL — AB (ref 11.6–15.9)
LYMPH#: 0.7 10*3/uL — AB (ref 0.9–3.3)
LYMPH%: 5.8 % — ABNORMAL LOW (ref 14.0–49.7)
MCH: 29.3 pg (ref 25.1–34.0)
MCHC: 31.7 g/dL (ref 31.5–36.0)
MCV: 92.4 fL (ref 79.5–101.0)
MONO#: 0.9 10*3/uL (ref 0.1–0.9)
MONO%: 7.2 % (ref 0.0–14.0)
NEUT#: 10.5 10*3/uL — ABNORMAL HIGH (ref 1.5–6.5)
NEUT%: 85.5 % — ABNORMAL HIGH (ref 38.4–76.8)
Platelets: 546 10*3/uL — ABNORMAL HIGH (ref 145–400)
RBC: 3.31 10*6/uL — ABNORMAL LOW (ref 3.70–5.45)
RDW: 19.6 % — AB (ref 11.2–14.5)
WBC: 12.3 10*3/uL — ABNORMAL HIGH (ref 3.9–10.3)

## 2015-08-29 LAB — COMPREHENSIVE METABOLIC PANEL
ALBUMIN: 1.9 g/dL — AB (ref 3.5–5.0)
ALT: 24 U/L (ref 0–55)
AST: 16 U/L (ref 5–34)
Alkaline Phosphatase: 69 U/L (ref 40–150)
Anion Gap: 8 mEq/L (ref 3–11)
BUN: 16.2 mg/dL (ref 7.0–26.0)
CALCIUM: 8.8 mg/dL (ref 8.4–10.4)
CHLORIDE: 97 meq/L — AB (ref 98–109)
CO2: 31 mEq/L — ABNORMAL HIGH (ref 22–29)
CREATININE: 0.8 mg/dL (ref 0.6–1.1)
EGFR: 78 mL/min/{1.73_m2} — ABNORMAL LOW (ref >90)
GLUCOSE: 148 mg/dL — AB (ref 70–140)
Potassium: 3.7 mEq/L (ref 3.5–5.1)
SODIUM: 136 meq/L (ref 136–145)
Total Bilirubin: <0.3 mg/dL (ref 0.20–1.20)
Total Protein: 6.4 g/dL (ref 6.4–8.3)

## 2015-08-29 MED ORDER — GABAPENTIN 100 MG PO CAPS: 200.0000 mg | ORAL_CAPSULE | Freq: Two times a day (BID) | ORAL | Status: AC

## 2015-08-29 MED ORDER — LIDOCAINE-PRILOCAINE 2.5-2.5 % EX CREA: TOPICAL_CREAM | CUTANEOUS | Status: AC

## 2015-08-29 MED ORDER — LORAZEPAM 1 MG PO TABS: ORAL_TABLET | ORAL | Status: DC

## 2015-08-29 NOTE — Progress Notes (Signed)
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 Disease involves pelvis, low abdominal retroperitoneum into posterior cecum, and new bilateral inguinal nodes confirmed by biopsy 08-03-15.  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 She was hospitalized 3-6 thru 08-08-15, with diverting descending colostomy by Dr Barry Dienes 08-05-15; she has not wanted nephrostomy tubes to this point and ureteral stents were not helpful.  Last treatment was 7 cycles of CDDP taxol avastin thru 05-05-15. S  She saw Dr [name] on 08-17-15, with discussion of palliative symptom management only vs further treatment in attempt to control disease. Patient desires further treatment. Recommendation is for doxil,  due to possible endometrial origin (see pathology 08-03-15 left inguinal node biopsy below) and prior chemo/ RT.   Peripheral IV access is inadequate for further chemo. She is in agreement with PAC, planned by Dr Barry Dienes 09-01-15.   Patient has had some general improvement since DC from hospital. PO intake is better, including chicken casserole, broccoli, cake, supplement drinks. She had temperature to 100 yesterday without chills or localizing symptoms of infection, took ibuprofen and immediately down to normal. 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 She uses gabapentin 100 mg in AM and some tramadol, tho she feels that both of these medications may cause difficulty sleeping at night. Gabapentin does seem helpful with low pelvic pain and she will try increasing to 200 mg AM and midday. She has  limited exercise tolerance, able to fold one load of laundry yesterday, but "lower body tired" when she stands for any length of time. She is up at least 3x per night to change depends. No nausea. No other bleeding. Not SOB at rest, no cough. Ostomy functioning well, HH no longer following. 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 No LE swelling.  Remainder of 10 point Review of Systems negative/ unchanged.     PAC scheduled for 09-01-15 by Dr Barry Dienes Refused flu vaccine Ureteral stents removed IV feraheme x 1 dose 08-02-15 PRBCs x 1 08-02-15  ONCOLOGIC HISTORY Patient had been in usual good health, with no previous abnormal PAP smears, when she developed frequent urination and vaginal bleeding in late 2015. She had endometrial biopsy by Dr Janyth Pupa [id number]) with adenocarcinoma. PET in [facility] 8-59-29 had hypermetabolic uptake within the uterine cervix and lower uterine segment and metastatic adenopathy within the bilateral pelvis and periaortic retroperitoneum. Dr Genia Del and Dr Marguerita Merles treated with pelvic and para-aortic radiation given with 6 cycles of sensitizing CDDP, and HDR x5 from 07-06-14 thru 09-03-14. Patient had a difficult time with nausea, vomiting, diarrhea and hypokalemia/ hypomagnesemia thru treatment. Patient used ODT zofran and ativan during chemotherapy. She had a difficult time doing oral prehydration for CDDP. She had no peripheral neuropathy with the CDDP. There was concern by completion of the treatment that she did not have optimal response, with plan for repeat evaluation at 6 weeks from completion of the treatment. Prior to planned reevaluation, patient began having daily fevers for possibly 4 weeks, as high as 102 degrees x 2 weeks prior to 10-11-14; by presentation at ED she also had increased right flank pain. She had CT at  [facility] on 10-08-14, this compared with PET of 05-2014 showed increase in subcapsular fluid  collection right kidney and right hydronephrosis, increase in size of endometrial canal/ uterus/ right adnexa, and improvement in retroperitoneal, common iliac and bilateral pelvic sidewall adenopathy. She was admitted to [facility] ICU 5-16/ 10-12-14 with sepsis from peptostreptococcus. She had percutaneous drainage by IR of 635 cc dark thin fluid from right perinephric area on 10-12-14, with perinephric drain removed by urology on 10-18-14. She had right ureteral stent placement by Dr Junious Silk on 10-13-14. She developed C.difficile diarrhea, begun on flagyl 10-15-14 x 2 weeks, also on ceftin during that time. She saw Dr [name] in consultation on 11-05-14, with cervix grossly normal appearance, firm to palpation with right anterior extension, and purulent material from cervical os with manipulation, no palpable adnexal masses, induration R>L parametrium. PET 10-04-27 had hypermetabolic tissue thickening at cecum, + small right sided mesenteric and inguinal nodes, foci in myometriium and in region of cervix, small right perinephric fluid collection and dilated fluid filled endometrium. Dr [name] saw her following the PET, with recommendation for systemic treatment of the metastatic disease with CDDP, taxol and avastin. First CDDP Taxol 11-19-14, neutropenic day 14. Repeat CT showed essentially stable, minimal right perinephric fluid on 12-06-14, prior to cycle 2 CDDP taxol 7-14 with first avastin 12-10-14 and neulasta. CT AP 02-21-15 after 4 cycles CDDP taxol/ 3 cycles avastin showed improvement. Cystoscopy, bladder biopsy with fulguration, right diagnostic ureteroscopy,right retrograde pyelogram, and right ureteral stent exchange by Dr Junious Silk on 02-18-15. 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 She was hospitalized with pyometria  02-2015, then recurrent symptoms treated outpatient 03-2015. She had cycle 7 taxol CDDP avastin on 12-7 and 05-05-15. She developed symptomatic vesicovaginal fistula in 05-2015, ureteral stents not helpful and were removed. 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   Objective:  Vital signs in last 24 hours:  BP 116/73 mmHg  Pulse 112  Temp(Src) 98.5 F (36.9 C) (Oral)  Resp 18  Ht 5' 4"  (1.626 m)  Wt 140 lb (63.504 kg)  BMI 24.02 kg/m2  SpO2 100%  LMP 11/05/2011 Weight up 6 lbs. Alert, oriented and appropriate. Ambulatory without assistance, respirations not labored RA, looks reasonably comfortable sitting in exam room.   No alopecia  HEENT:PERRL, sclerae not icteric. Oral mucosa moist without lesions, posterior pharynx clear. No JVD.  Lymphatics:no supraclavicular adenopathy Resp: clear to auscultation bilaterally and normal percussion bilaterally Cardio: regular rate and rhythm. No gallop. GI: soft, nontender, not distended, no mass or organomegaly. Normally active bowel sounds. Ostomy with soft formed stool, appliance functioning well.  Musculoskeletal/ Extremities: without pitting edema, cords, tenderness Neuro: stable peripheral neuropathy. Otherwise nonfocal. PSYCH appropriate mood and affect Skin without rash, ecchymosis, petechiae   Lab Results:  Results for orders placed or performed in visit on 08/29/15  CBC with Differential  Result Value Ref Range   WBC 12.3 (H) 3.9 - 10.3 10e3/uL   NEUT# 10.5 (H) 1.5 - 6.5 10e3/uL   HGB 9.7 (L) 11.6 - 15.9 g/dL   HCT 30.6 (L) 34.8 - 46.6 %   Platelets 546 (H) 145 - 400 10e3/uL   MCV 92.4 79.5 - 101.0 fL   MCH 29.3 25.1 - 34.0 pg   MCHC 31.7 31.5 - 36.0 g/dL  RBC 3.31 (L) 3.70 - 5.45 10e6/uL   RDW 19.6 (H) 11.2 - 14.5 %   lymph# 0.7 (L) 0.9 - 3.3 10e3/uL   MONO# 0.9 0.1 - 0.9 10e3/uL   Eosinophils Absolute 0.2 0.0 - 0.5 10e3/uL   Basophils Absolute 0.0 0.0 -  0.1 10e3/uL   NEUT% 85.5 (H) 38.4 - 76.8 %   LYMPH% 5.8 (L) 14.0 - 49.7 %   MONO% 7.2 0.0 - 14.0 %   EOS% 1.3 0.0 - 7.0 %   BASO% 0.2 0.0 - 2.0 %  Comprehensive metabolic panel  Result Value Ref Range   Sodium 136 136 - 145 mEq/L   Potassium 3.7 3.5 - 5.1 mEq/L   Chloride 97 (L) 98 - 109 mEq/L   CO2 31 (H) 22 - 29 mEq/L   Glucose 148 (H) 70 - 140 mg/dl   BUN 16.2 7.0 - 26.0 mg/dL   Creatinine 0.8 0.6 - 1.1 mg/dL   Total Bilirubin <0.30 0.20 - 1.20 mg/dL   Alkaline Phosphatase 69 40 - 150 U/L   AST 16 5 - 34 U/L   ALT 24 0 - 55 U/L   Total Protein 6.4 6.4 - 8.3 g/dL   Albumin 1.9 (L) 3.5 - 5.0 g/dL   Calcium 8.8 8.4 - 10.4 mg/dL   Anion Gap 8 3 - 11 mEq/L   EGFR 78 (L) >90 ml/min/1.73 m2     Studies/Results: EXAM: CT ABDOMEN AND PELVIS WITHOUT CONTRAST  08-01-15  TECHNIQUE: Multidetector CT imaging of the abdomen and pelvis was performed following the standard protocol without IV contrast.  COMPARISON: 06/09/2015  FINDINGS: Lower chest: The visualized portions of the lung bases and heart are clear.  Hepatobiliary: Status post cholecystectomy. The liver appears normal.  Pancreas: Normal  Spleen: Normal  Adrenals/Urinary Tract: Adrenal glands are normal. Right kidney is smaller than the left. Perinephric right fluid collection unchanged. Bilateral ureteral stents present. Bladder is decompressed with wall thickening and there is air within the bladder.  Stomach/Bowel: The stomach is normal. Small bowel is normal except for stable wall thickening of the distal ileum. There is significant diffuse rectal wall thickening, slightly more prominent when compared to the prior study. Wall thickening also involves nearly the entire sigmoid colon, which represents a change from prior study.  Vascular/Lymphatic: No evidence of aortic dilatation. No significant retroperitoneal adenopathy. Index right inguinal lymph node previously measured 13 mm and measures 13 mm  once again today. Left inguinal lymph node series 2, image number 84 is larger, measuring 16 mm on the current examination. It was not enlarged previously. On image number 78 left inguinal adenopathy measures 18 mm in diameter as opposed to 7 mm previously.  Reproductive: There are unchanged calcifications involving the endometrium and are again likely related to fibroids. When compared to the prior study there is increased air within the endometrial canal. There is also again air in the region of the vaginal fornices and cervical canal. Oral contrast is also seen into the endocervical canal. Seen best on sagittal images, there is a thin tract of contrast extending anteriorly from the cervix to the bladder. This is seen best on sagittal series image number 60.  Stable bilateral ovarian cysts or follicles.  Other: No significant ascites  Musculoskeletal: No acute musculoskeletal findings  IMPRESSION: 1. Increased inguinal adenopathy  2. Stable radiation enteritis  3. Stable right perinephric fluid and ureteral stents. No hydronephrosis.  4. The presence of abnormal air is noted within the endometrium and within the  bladder, implying the possibility of infection and also of fistula to both structures. On this examination which was performed with oral contrast only, the presence of contrast within the cervical canal confirms known rectovaginal fistula. Seen best on the sagittal images, there also appears to be fistula extending anteriorly from the cervical region to the bladder.  5. Increased wall thickening of the rectum and most of the sigmoid colon, suggesting possibility of radiation colitis.    Stacy Webster, Stacy Webster [facility] Collected: 08/03/2015 Client: [facility] Accession: [id number] Received: 08/04/2015 John "Ronny Bacon, MD REPORT OF SURGICAL PATHOLOGYNAL DIAGNOSIS Diagnosis Lymph node, needle/core biopsy, left inguinal METASTATIC CARCINOMA. 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 The neoplasm stains positive for ck8/18, ck903, focal ck20, negative for ER, p16 , cd68 and p63.   ECHOCARDIOGRAM  10-13-14 with EF 55-60% . Have recommended repeat echo around time of first doxil, tho can treat cycle 1 prior to echo if unable to do otherwise.   Medications: I have reviewed the patient's current medications.  DISCUSSION Interval history reviewed as above. We have discussed symptom management vs trying additional treatment to control disease; patient still very much wants to try treatment. They tell me that Dr [name] suggested restaging scans after 2 cycles doxil; patient would like PET, tho likely best to start with CTs, discussed.  We have discussed doxil, including administration, possible side effects, skin considerations. She has had separate teaching by RN and has been given written information for the doxil .  Verbal consent given for chemotherapy, which we will try to start ~ 09-06-15 coordinating with sister-in-law's schedule.  I have confirmed  PAC plans with Dr Marlowe Aschoff office now.   Likely will add second feraheme after PAC placement also.  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 Treated with 7 cycles of CDDP taxol avastin thru 05-05-15. Progressive disease involving inguinal nodes, contraindications to continuing same regimen. Plan to begin doxil after PAC if preauthorization obtained.  2. Vesicovaginal fistula, bilateral ureteral stents 06-21-15 did not improve situation and were removed. Urology has discussed percutaneous nephrostomy tubes, however patient prefers to  manage with incontinence for now. 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 Functioning well and patient is tolerating well, Dr Marlowe Aschoff help much appreciated.  4.progressive weight loss with poor po intake related to above, severe protein calorie malnutrition: oral nutrition better and some weight gain now, encouraged to continue. Hypoalbuminemia. Request to [facility] nutritionist to follow. 5.difficult peripheral IV access, agrees to [facility] by Dr Barry Dienes, to be done later this week. 6.iron deficiency related to ongoing blood loss from stents, blood draws. IV iron x first dose 08-02-15, PRBCs also 08-02-15. Likely can give second feraheme after PAC. Dislikes oral iron.  7.history of urosepsis and C diff in 2016 8.refused flu vaccine. 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 Doxil orders placed including oral ice and ice packs palms and soles, managed care notified. Neulasta requested, managed care notified, this due to extensive prior treatment and pelvic radiation. Cc PCP, Drs [name], Bing Ree, [place]. Time spent 40 min including >50% counseling and coordination of care.  LIVESAY,LENNIS P, MD   08/30/2015, 12:09 PM

## 2015-08-29 NOTE — Telephone Encounter (Signed)
-----   Message from Gordy Levan, MD sent at 08/29/2015  3:22 PM EDT ----- Gabapentin 100 mg   Two tablets bid or as directed  #120  1 RF  EMLA  thanks

## 2015-08-29 NOTE — Telephone Encounter (Signed)
appt made and avs printed °

## 2015-08-30 ENCOUNTER — Ambulatory Visit (HOSPITAL_COMMUNITY): Payer: BC Managed Care – PPO

## 2015-08-30 ENCOUNTER — Encounter (HOSPITAL_BASED_OUTPATIENT_CLINIC_OR_DEPARTMENT_OTHER)
Admission: RE | Disposition: A | Discharge status: Home / Self Care | Payer: Self-pay | Source: Ambulatory Visit | Attending: General Surgery

## 2015-08-30 ENCOUNTER — Ambulatory Visit (HOSPITAL_BASED_OUTPATIENT_CLINIC_OR_DEPARTMENT_OTHER): Payer: BC Managed Care – PPO | Admitting: Anesthesiology

## 2015-08-30 ENCOUNTER — Telehealth: Payer: Self-pay | Admitting: Oncology

## 2015-08-30 ENCOUNTER — Ambulatory Visit (HOSPITAL_BASED_OUTPATIENT_CLINIC_OR_DEPARTMENT_OTHER)
Admission: RE | Admit: 2015-08-30 | Discharge: 2015-08-30 | Disposition: A | Discharge status: Home / Self Care | Payer: BC Managed Care – PPO | Source: Ambulatory Visit | Attending: General Surgery | Admitting: General Surgery

## 2015-08-30 ENCOUNTER — Encounter (HOSPITAL_BASED_OUTPATIENT_CLINIC_OR_DEPARTMENT_OTHER): Payer: Self-pay

## 2015-08-30 DIAGNOSIS — D649 Anemia, unspecified: Secondary | ICD-10-CM | POA: Insufficient documentation

## 2015-08-30 DIAGNOSIS — N824 Other female intestinal-genital tract fistulae: Secondary | ICD-10-CM | POA: Insufficient documentation

## 2015-08-30 DIAGNOSIS — Z79899 Other long term (current) drug therapy: Secondary | ICD-10-CM | POA: Insufficient documentation

## 2015-08-30 DIAGNOSIS — J45909 Unspecified asthma, uncomplicated: Secondary | ICD-10-CM | POA: Insufficient documentation

## 2015-08-30 DIAGNOSIS — C799 Secondary malignant neoplasm of unspecified site: Secondary | ICD-10-CM | POA: Insufficient documentation

## 2015-08-30 DIAGNOSIS — K219 Gastro-esophageal reflux disease without esophagitis: Secondary | ICD-10-CM | POA: Diagnosis not present

## 2015-08-30 DIAGNOSIS — Z419 Encounter for procedure for purposes other than remedying health state, unspecified: Secondary | ICD-10-CM

## 2015-08-30 DIAGNOSIS — Z933 Colostomy status: Secondary | ICD-10-CM | POA: Diagnosis not present

## 2015-08-30 DIAGNOSIS — Z95828 Presence of other vascular implants and grafts: Secondary | ICD-10-CM

## 2015-08-30 DIAGNOSIS — C539 Malignant neoplasm of cervix uteri, unspecified: Secondary | ICD-10-CM | POA: Diagnosis present

## 2015-08-30 DIAGNOSIS — I878 Other specified disorders of veins: Secondary | ICD-10-CM | POA: Insufficient documentation

## 2015-08-30 DIAGNOSIS — C541 Malignant neoplasm of endometrium: Secondary | ICD-10-CM | POA: Insufficient documentation

## 2015-08-30 HISTORY — PX: PORTACATH PLACEMENT: SHX2246

## 2015-08-30 HISTORY — DX: Anxiety disorder, unspecified: F41.9

## 2015-08-30 SURGERY — INSERTION, TUNNELED CENTRAL VENOUS DEVICE, WITH PORT
Anesthesia: General

## 2015-08-30 MED ORDER — PROPOFOL 10 MG/ML IV BOLUS
INTRAVENOUS | Status: AC
  Filled 2015-08-30: qty 40

## 2015-08-30 MED ORDER — HEPARIN SOD (PORK) LOCK FLUSH 100 UNIT/ML IV SOLN
INTRAVENOUS | Status: DC | PRN
  Administered 2015-08-30: 500 [IU]

## 2015-08-30 MED ORDER — MIDAZOLAM HCL 2 MG/2ML IJ SOLN: 1.0000 mg | INTRAMUSCULAR | Status: DC | PRN

## 2015-08-30 MED ORDER — HEPARIN SOD (PORK) LOCK FLUSH 100 UNIT/ML IV SOLN
INTRAVENOUS | Status: AC
  Filled 2015-08-30: qty 5

## 2015-08-30 MED ORDER — LACTATED RINGERS IV SOLN: INTRAVENOUS | Status: DC

## 2015-08-30 MED ORDER — FENTANYL CITRATE (PF) 100 MCG/2ML IJ SOLN: 25.0000 ug | INTRAMUSCULAR | Status: DC | PRN

## 2015-08-30 MED ORDER — BUPIVACAINE-EPINEPHRINE 0.5% -1:200000 IJ SOLN
INTRAMUSCULAR | Status: DC | PRN
  Administered 2015-08-30: 15 mL

## 2015-08-30 MED ORDER — LIDOCAINE HCL (CARDIAC) 20 MG/ML IV SOLN
INTRAVENOUS | Status: AC
  Filled 2015-08-30: qty 5

## 2015-08-30 MED ORDER — PROPOFOL 10 MG/ML IV BOLUS
INTRAVENOUS | Status: DC | PRN
  Administered 2015-08-30: 200 mg via INTRAVENOUS

## 2015-08-30 MED ORDER — PROMETHAZINE HCL 25 MG/ML IJ SOLN: 6.2500 mg | INTRAMUSCULAR | Status: DC | PRN

## 2015-08-30 MED ORDER — CEFAZOLIN SODIUM-DEXTROSE 2-4 GM/100ML-% IV SOLN
INTRAVENOUS | Status: AC
  Filled 2015-08-30: qty 100

## 2015-08-30 MED ORDER — LIDOCAINE-EPINEPHRINE (PF) 1 %-1:200000 IJ SOLN
INTRAMUSCULAR | Status: AC
  Filled 2015-08-30: qty 30

## 2015-08-30 MED ORDER — CEFAZOLIN SODIUM-DEXTROSE 2-4 GM/100ML-% IV SOLN
2.0000 g | INTRAVENOUS | Status: AC
Start: 2015-08-30 — End: 2015-08-30
  Administered 2015-08-30: 2 g via INTRAVENOUS

## 2015-08-30 MED ORDER — ONDANSETRON HCL 4 MG/2ML IJ SOLN
INTRAMUSCULAR | Status: DC | PRN
  Administered 2015-08-30: 4 mg via INTRAVENOUS

## 2015-08-30 MED ORDER — MIDAZOLAM HCL 2 MG/2ML IJ SOLN
INTRAMUSCULAR | Status: AC
  Filled 2015-08-30: qty 2

## 2015-08-30 MED ORDER — FENTANYL CITRATE (PF) 100 MCG/2ML IJ SOLN
50.0000 ug | INTRAMUSCULAR | Status: DC | PRN
  Administered 2015-08-30: 50 ug via INTRAVENOUS

## 2015-08-30 MED ORDER — HEPARIN (PORCINE) IN NACL 2-0.9 UNIT/ML-% IJ SOLN
INTRAMUSCULAR | Status: DC | PRN
  Administered 2015-08-30: 1 via INTRAVENOUS

## 2015-08-30 MED ORDER — DEXAMETHASONE SODIUM PHOSPHATE 10 MG/ML IJ SOLN
INTRAMUSCULAR | Status: AC
  Filled 2015-08-30: qty 1

## 2015-08-30 MED ORDER — FENTANYL CITRATE (PF) 100 MCG/2ML IJ SOLN
INTRAMUSCULAR | Status: AC
  Filled 2015-08-30: qty 2

## 2015-08-30 MED ORDER — FENTANYL CITRATE (PF) 100 MCG/2ML IJ SOLN
INTRAMUSCULAR | Status: DC | PRN
  Administered 2015-08-30 (×2): 50 ug via INTRAVENOUS

## 2015-08-30 MED ORDER — BUPIVACAINE HCL (PF) 0.25 % IJ SOLN
INTRAMUSCULAR | Status: AC
  Filled 2015-08-30: qty 30

## 2015-08-30 MED ORDER — ONDANSETRON HCL 4 MG/2ML IJ SOLN
INTRAMUSCULAR | Status: AC
  Filled 2015-08-30: qty 2

## 2015-08-30 MED ORDER — SCOPOLAMINE 1 MG/3DAYS TD PT72: 1.0000 | MEDICATED_PATCH | Freq: Once | TRANSDERMAL | Status: DC | PRN

## 2015-08-30 MED ORDER — LIDOCAINE HCL (CARDIAC) 20 MG/ML IV SOLN
INTRAVENOUS | Status: DC | PRN
  Administered 2015-08-30: 50 mg via INTRAVENOUS

## 2015-08-30 MED ORDER — GLYCOPYRROLATE 0.2 MG/ML IJ SOLN: 0.2000 mg | Freq: Once | INTRAMUSCULAR | Status: DC | PRN

## 2015-08-30 MED ORDER — DEXAMETHASONE SODIUM PHOSPHATE 4 MG/ML IJ SOLN
INTRAMUSCULAR | Status: DC | PRN
  Administered 2015-08-30: 10 mg via INTRAVENOUS

## 2015-08-30 MED ORDER — MIDAZOLAM HCL 5 MG/5ML IJ SOLN
INTRAMUSCULAR | Status: DC | PRN
  Administered 2015-08-30: 2 mg via INTRAVENOUS

## 2015-08-30 MED ORDER — HEPARIN (PORCINE) IN NACL 2-0.9 UNIT/ML-% IJ SOLN
INTRAMUSCULAR | Status: AC
  Filled 2015-08-30: qty 500

## 2015-08-30 MED ORDER — MEPERIDINE HCL 25 MG/ML IJ SOLN: 6.2500 mg | INTRAMUSCULAR | Status: DC | PRN

## 2015-08-30 MED ORDER — LACTATED RINGERS IV SOLN
INTRAVENOUS | Status: DC
  Administered 2015-08-30 (×2): via INTRAVENOUS

## 2015-08-30 MED ORDER — BUPIVACAINE-EPINEPHRINE (PF) 0.5% -1:200000 IJ SOLN
INTRAMUSCULAR | Status: AC
  Filled 2015-08-30: qty 30

## 2015-08-30 SURGICAL SUPPLY — 40 items
BAG DECANTER FOR FLEXI CONT (MISCELLANEOUS) ×3 IMPLANT
BLADE HEX COATED 2.75 (ELECTRODE) ×3 IMPLANT
BLADE SURG 11 STRL SS (BLADE) ×3 IMPLANT
BLADE SURG 15 STRL LF DISP TIS (BLADE) ×1 IMPLANT
BLADE SURG 15 STRL SS (BLADE) ×2
CHLORAPREP W/TINT 26ML (MISCELLANEOUS) ×3 IMPLANT
COVER BACK TABLE 60X90IN (DRAPES) ×3 IMPLANT
COVER MAYO STAND STRL (DRAPES) ×3 IMPLANT
DECANTER SPIKE VIAL GLASS SM (MISCELLANEOUS) IMPLANT
DERMABOND ADVANCED (GAUZE/BANDAGES/DRESSINGS) ×2
DERMABOND ADVANCED .7 DNX12 (GAUZE/BANDAGES/DRESSINGS) ×1 IMPLANT
DRAPE C-ARM 42X72 X-RAY (DRAPES) ×3 IMPLANT
DRAPE LAPAROTOMY TRNSV 102X78 (DRAPE) ×3 IMPLANT
DRAPE UTILITY XL STRL (DRAPES) ×3 IMPLANT
DRSG TEGADERM 4X4.75 (GAUZE/BANDAGES/DRESSINGS) IMPLANT
ELECT REM PT RETURN 9FT ADLT (ELECTROSURGICAL) ×3
ELECTRODE REM PT RTRN 9FT ADLT (ELECTROSURGICAL) ×1 IMPLANT
GLOVE BIO SURGEON STRL SZ 6 (GLOVE) ×3 IMPLANT
GLOVE BIOGEL PI IND STRL 6.5 (GLOVE) ×1 IMPLANT
GLOVE BIOGEL PI INDICATOR 6.5 (GLOVE) ×2
GOWN STRL REUS W/ TWL LRG LVL3 (GOWN DISPOSABLE) ×1 IMPLANT
GOWN STRL REUS W/TWL 2XL LVL3 (GOWN DISPOSABLE) ×3 IMPLANT
GOWN STRL REUS W/TWL LRG LVL3 (GOWN DISPOSABLE) ×2
KIT PORT POWER 8FR ISP CVUE (Catheter) ×3 IMPLANT
LIQUID BAND (GAUZE/BANDAGES/DRESSINGS) ×3 IMPLANT
NEEDLE HYPO 25X1 1.5 SAFETY (NEEDLE) ×3 IMPLANT
PACK BASIN DAY SURGERY FS (CUSTOM PROCEDURE TRAY) ×3 IMPLANT
PENCIL BUTTON HOLSTER BLD 10FT (ELECTRODE) ×3 IMPLANT
SLEEVE SCD COMPRESS KNEE MED (MISCELLANEOUS) ×3 IMPLANT
SPONGE GAUZE 4X4 12PLY STER LF (GAUZE/BANDAGES/DRESSINGS) IMPLANT
SUT MNCRL AB 4-0 PS2 18 (SUTURE) ×3 IMPLANT
SUT PROLENE 2 0 SH DA (SUTURE) ×6 IMPLANT
SUT VIC AB 3-0 SH 27 (SUTURE) ×2
SUT VIC AB 3-0 SH 27X BRD (SUTURE) ×1 IMPLANT
SUT VICRYL 3-0 CR8 SH (SUTURE) IMPLANT
SYR 5ML LUER SLIP (SYRINGE) ×3 IMPLANT
SYR CONTROL 10ML LL (SYRINGE) ×3 IMPLANT
SYRINGE 10CC LL (SYRINGE) ×3 IMPLANT
TOWEL OR 17X24 6PK STRL BLUE (TOWEL DISPOSABLE) ×3 IMPLANT
TOWEL OR NON WOVEN STRL DISP B (DISPOSABLE) ×3 IMPLANT

## 2015-08-30 NOTE — Anesthesia Preprocedure Evaluation (Signed)
Anesthesia Evaluation  Patient identified by MRN, date of birth, ID band Patient awake    Reviewed: Allergy & Precautions, NPO status , Patient's Chart, lab work & pertinent test results  History of Anesthesia Complications (+) PONV  Airway Mallampati: II  TM Distance: >3 FB Neck ROM: Full    Dental no notable dental hx. (+) Chipped   Pulmonary asthma ,    Pulmonary exam normal breath sounds clear to auscultation       Cardiovascular negative cardio ROS Normal cardiovascular exam Rhythm:Regular Rate:Normal     Neuro/Psych negative neurological ROS  negative psych ROS   GI/Hepatic Neg liver ROS, GERD  Medicated and Controlled,  Endo/Other  negative endocrine ROS  Renal/GU Renal disease  Female GU complaint     Musculoskeletal negative musculoskeletal ROS (+)   Abdominal   Peds negative pediatric ROS (+)  Hematology  (+) anemia ,   Anesthesia Other Findings Cervical Ca w mets   Reproductive/Obstetrics negative OB ROS                             Anesthesia Physical  Anesthesia Plan  ASA: II  Anesthesia Plan: General   Post-op Pain Management:    Induction: Intravenous  Airway Management Planned: LMA  Additional Equipment:   Intra-op Plan:   Post-operative Plan: Extubation in OR  Informed Consent: I have reviewed the patients History and Physical, chart, labs and discussed the procedure including the risks, benefits and alternatives for the proposed anesthesia with the patient or authorized representative who has indicated his/her understanding and acceptance.   Dental advisory given  Plan Discussed with: CRNA  Anesthesia Plan Comments:         Anesthesia Quick Evaluation

## 2015-08-30 NOTE — Interval H&P Note (Signed)
History and Physical Interval Note:  08/30/2015 7:24 AM  Stacy Webster  has presented today for surgery, with the diagnosis of Recurrent cervical cancer   The various methods of treatment have been discussed with the patient and family. After consideration of risks, benefits and other options for treatment, the patient has consented to  Procedure(s): INSERTION PORT-A-CATH (N/A) as a surgical intervention .  The patient's history has been reviewed, patient examined, no change in status, stable for surgery.  I have reviewed the patient's chart and labs.  Questions were answered to the patient's satisfaction.     Mariona Scholes

## 2015-08-30 NOTE — Anesthesia Postprocedure Evaluation (Signed)
Anesthesia Post Note  Patient: Stacy Webster  Procedure(s) Performed: Procedure(s) (LRB): INSERTION PORT-A-CATH (N/A)  Patient location during evaluation: PACU Anesthesia Type: General Level of consciousness: awake and alert Pain management: pain level controlled Vital Signs Assessment: post-procedure vital signs reviewed and stable Respiratory status: spontaneous breathing, nonlabored ventilation, respiratory function stable and patient connected to nasal cannula oxygen Cardiovascular status: blood pressure returned to baseline and stable Postop Assessment: no signs of nausea or vomiting Anesthetic complications: no    Last Vitals:  Filed Vitals:   08/30/15 0915 08/30/15 0939  BP: 113/67 120/67  Pulse: 84 88  Temp:  36.8 C  Resp: 16 16    Last Pain:  Filed Vitals:   08/30/15 1003  PainSc: 0-No pain                 Montez Hageman

## 2015-08-30 NOTE — H&P (Signed)
Stacy Webster 08/26/2015 11:10 AM Location: [facility] Patient #: [id number] DOB: [birth year] Married / Language: Cleophus Molt / Race: White Female   History of Present Illness Stark Klein MD; 08/26/2015 11:43 AM) Patient words: post op colostomy.  The patient is a 55 year old female presenting for a post-operative visit. 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 I performed a laparoscopic diverting colostomy on her on March 10. I had planned to do a loop colostomy, but her sigmoid colon was tethered and so I disconnected this and did a end colostomy on the left side. She is doing well postoperatively with minimal surgical pain. She did have a port site that had a seroma that drained from it spontaneously. She has pain in her pelvis from her tumor. She did have inguinal lymph nodes that were enlarged and biopsy showed that this was metastatic cervical cancer. They are currently doing a pathology review to make sure that this does not have a component of endometrial cancer. This would change palliative chemotherapy options. She has an appointment with [name] to discuss chemotherapy 3 days from now.  She denies nausea or vomiting. Her ostomy has been working well. She has been doing well changing her bag. She is using a two-piece bag and cutting her own shape in the wafer. She is using an Eakin ring to maximize the fit of the bag. She did have a rash around the stoma, but this has improved with the use of powder.   Other Problems Mammie Lorenzo, LPN; 579FGE 579FGE AM) Asthma Bladder Problems Cancer Cervical Cancer Gastroesophageal Reflux Disease Hemorrhoids  Past Surgical History Mammie Lorenzo, LPN; 579FGE 579FGE AM) Gallbladder Surgery - Laparoscopic Tonsillectomy Laparoscopic diverting colostomy  Diagnostic Studies History Mammie Lorenzo, LPN; 579FGE 579FGE  AM) Colonoscopy never Mammogram 1-3 years ago Pap Smear 1-5 years ago  Allergies Mammie Lorenzo, LPN; 579FGE 624THL AM) Morphine Derivatives Rash. Zofran *ANTIEMETICS* Rash. Codeine and Related Nausea. Hydrocodone-Acetaminophen *ANALGESICS - OPIOID* Nausea, Vomiting.  Medication History Mammie Lorenzo, LPN; 579FGE [id number] AM) LORazepam (1MG  Tablet, Oral) Active. TraMADol HCl (50MG  Tablet, Oral) Active. Gabapentin (100MG  Capsule, Oral) Active. Pantoprazole Sodium (40MG  Tablet DR, Oral) Active. Potassium Chloride ER (10MEQ Tablet ER, Oral) Active. Albuterol Sulfate (108 (90 Base)MCG/ACT Aero Pow Br Act, Inhalation) Active. Lomotil (2.5-0.025MG  Tablet, Oral) Active. Colace (100MG  Capsule, Oral) Active. Hemocyte (324 (106 Fe)MG Tablet, Oral) Active. Advil (200MG  Tablet, Oral) Active. Boost Plus (Oral) Active. Magnesium Oxide (500MG  Tablet, Oral) Active. Vitamin C (500MG  Capsule, Oral) Active. Medications Reconciled  Social History Mammie Lorenzo, LPN; 579FGE 579FGE AM) Caffeine use Carbonated beverages. No alcohol use No drug use Tobacco use Never smoker.  Family History Mammie Lorenzo, LPN; 579FGE 579FGE AM) Heart Disease Father. Hypertension Father.  Pregnancy / Birth History Mammie Lorenzo, LPN; 579FGE 579FGE AM) Age at menarche 89 years. Contraceptive History Oral contraceptives. Gravida 5 Irregular periods Maternal age 55-30 Para 3    Review of Systems Mammie Lorenzo LPN; 579FGE 579FGE AM) General Present- Appetite Loss, Fatigue and Weight Loss. Not Present- Chills, Fever, Night Sweats and Weight Gain. Skin Present- Rash. Not Present- Change in Wart/Mole, Dryness, Hives, Jaundice, New Lesions, Non-Healing Wounds and Ulcer. Cardiovascular Present- Rapid Heart Rate. Not Present- Chest Pain, Difficulty Breathing Lying Down, Leg Cramps, Palpitations, Shortness of Breath and Swelling of Extremities. Gastrointestinal Present-  Change in Bowel Habits, Gets full quickly at meals, Hemorrhoids, Indigestion and Rectal Pain. Not Present- Abdominal Pain, Bloating, Bloody Stool, Chronic diarrhea,  Constipation, Difficulty Swallowing, Excessive gas, Nausea and Vomiting. Female Genitourinary Present- Frequency and Nocturia. Not Present- Painful Urination, Pelvic Pain and Urgency. Musculoskeletal Present- Muscle Weakness. Not Present- Back Pain, Joint Pain, Joint Stiffness, Muscle Pain and Swelling of Extremities. Neurological Present- Numbness, Tingling, Tremor and Weakness. Not Present- Decreased Memory, Fainting, Headaches, Seizures and Trouble walking.  Vitals Claiborne Billings Dockery LPN; 579FGE [id number] AM) 08/26/2015 11:11 AM Weight: 137 lb Height: 64in Height was reported by patient. Body Surface Area: 1.67 m Body Mass Index: 23.52 kg/m  Temp.: 98.65F(Oral)  Pulse: 124 (Regular)  BP: 124/86 (Sitting, Right Arm, Standard) pt states pulse is always high. Manually palpated and pulse is rapid.      Physical Exam Stark Klein MD; 08/26/2015 11:44 AM) General Mental Status-Alert. General Appearance-Consistent with stated age. Hydration-Well hydrated. Voice-Normal.  Chest and Lung Exam Chest and lung exam reveals -quiet, even and easy respiratory effort with no use of accessory muscles. Inspection Chest Wall - Normal. Back - normal.  Cardiovascular Cardiovascular examination reveals -normal pedal pulses bilaterally. Note: regular rate and rhythm  Abdomen Note: soft, non tender, non distended. Stoma bag new. stitch fragment coming out of left upper quadrant site.     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 RECURRENT CERVICAL CANCER (C53.9) Impression: Discussed port placement.  Will tentatively schedule for thursday next week. Current Plans Pt Education - ccs port insertion education   Signed by  Stark Klein, MD (08/26/2015 11:45 AM)

## 2015-08-30 NOTE — Anesthesia Procedure Notes (Signed)
Procedure Name: LMA Insertion Date/Time: 08/30/2015 7:40 AM Performed by: Toula Moos L Pre-anesthesia Checklist: Patient identified, Emergency Drugs available, Suction available, Patient being monitored and Timeout performed Patient Re-evaluated:Patient Re-evaluated prior to inductionOxygen Delivery Method: Circle System Utilized Preoxygenation: Pre-oxygenation with 100% oxygen Intubation Type: IV induction Ventilation: Mask ventilation without difficulty LMA: LMA inserted LMA Size: 4.0 Number of attempts: 1 Airway Equipment and Method: Bite block Placement Confirmation: positive ETCO2 Tube secured with: Tape Dental Injury: Teeth and Oropharynx as per pre-operative assessment

## 2015-08-30 NOTE — Op Note (Signed)
PREOPERATIVE DIAGNOSIS:  Recurrent cervical cancer     POSTOPERATIVE DIAGNOSIS:  Same     PROCEDURE: Left subclavian port placement, Bard ClearVue  Power Port, MRI safe, 8-French.      SURGEON:  Stark Klein, MD      ANESTHESIA:  General   FINDINGS:  Good venous return, easy flush, and tip of the catheter and   SVC 22.5 cm.      SPECIMEN:  None.      ESTIMATED BLOOD LOSS:  Minimal.      COMPLICATIONS:  None known.      PROCEDURE:  Pt was identified in the holding area and taken to   the operating room, where patient was placed supine on the operating room   table.  General anesthesia was induced.  Patient's arms were tucked and the upper   chest and neck were prepped and draped in sterile fashion.  Time-out was   performed according to the surgical safety check list.  When all was   correct, we continued.   Local anesthetic was administered over this   area at the angle of the clavicle.  The vein was accessed with 1 pass of the needle. There was good venous return and the wire passed easily with no ectopy.   Fluoroscopy was used to confirm that the wire was in the vena cava.      The patient was placed back level and the area for the pocket was anethetized   with local anesthetic.  A 3-cm transverse incision was made with a #15   blade.  Cautery was used to divide the subcutaneous tissues down to the   pectoralis muscle.  An Army-Nav[employer] retractor was used to elevate the skin   while a pocket was created on top of the pectoralis fascia.  The port   was placed into the pocket to confirm that it was of adequate size.  The   catheter was preattached to the port.  The port was then secured to the   pectoralis fascia with four 2-0 Prolene sutures.  These were clamped and   not tied down yet.    The catheter was tunneled through to the wire exit   site.  The catheter was placed along the wire to determine what length it should be to be in the SVC.  The catheter was cut at 22.5 cm.  The  tunneler sheath and dilator were passed over the wire and the dilator and wire were removed.  The catheter was advanced through the tunneler sheath and the tunneler sheath was pulled away.  Care was taken to keep the catheter in the tunneler sheath as this occurred. This was advanced and the tunneler sheath was removed.  There was good venous   return and easy flush of the catheter.  The Prolene sutures were tied   down to the pectoral fascia.  The skin was reapproximated using 3-0   Vicryl interrupted deep dermal sutures.    Fluoroscopy was used to re-confirm good position of the catheter.  The skin   was then closed using 4-0 Monocryl in a subcuticular fashion.  The port was flushed with concentrated heparin flush as well.  The wounds were then cleaned, dried, and dressed with Dermabond.  The patient was awakened from anesthesia and taken to the PACU in stable condition.  Needle, sponge, and instrument counts were correct.               Stark Klein, MD

## 2015-08-30 NOTE — Transfer of Care (Signed)
Immediate Anesthesia Transfer of Care Note  Patient: Stacy Webster  Procedure(s) Performed: Procedure(s): INSERTION PORT-A-CATH (N/A)  Patient Location: PACU  Anesthesia Type:General  Level of Consciousness: awake and patient cooperative  Airway & Oxygen Therapy: Patient Spontanous Breathing and Patient connected to face mask oxygen  Post-op Assessment: Report given to RN and Post -op Vital signs reviewed and stable  Post vital signs: Reviewed and stable  Last Vitals:  Filed Vitals:   08/30/15 0638  BP: 119/68  Pulse: 108  Temp: 36.8 C  Resp: 18    Complications: No apparent anesthesia complications

## 2015-08-30 NOTE — Telephone Encounter (Signed)
Spoke with patient to confirm echo appt 4/10 at 11 am at [facility]

## 2015-08-30 NOTE — Discharge Instructions (Addendum)
[facility] °Office Phone Number [phone] ° ° POST OP INSTRUCTIONS ° °Always review your discharge instruction sheet given to you by the facility where your surgery was performed. ° °IF YOU HAVE DISABILITY OR FAMILY LEAVE FORMS, YOU MUST BRING THEM TO THE OFFICE FOR PROCESSING.  DO NOT GIVE THEM TO YOUR DOCTOR. ° °1. A prescription for pain medication may be given to you upon discharge.  Take your pain medication as prescribed, if needed.  If narcotic pain medicine is not needed, then you may take acetaminophen (Tylenol) or ibuprofen (Advil) as needed. °2. Take your usually prescribed medications unless otherwise directed °3. If you need a refill on your pain medication, please contact your pharmacy.  They will contact our office to request authorization.  Prescriptions will not be filled after 5pm or on week-ends. °4. You should eat very light the first 24 hours after surgery, such as soup, crackers, pudding, etc.  Resume your normal diet the day after surgery °5. It is common to experience some constipation if taking pain medication after surgery.  Increasing fluid intake and taking a stool softener will usually help or prevent this problem from occurring.  A mild laxative (Milk of Magnesia or Miralax) should be taken according to package directions if there are no bowel movements after 48 hours. °6. You may shower in 48 hours.  The surgical glue will flake off in 2-3 weeks.   °7. ACTIVITIES:  No strenuous activity or heavy lifting for 1 week.   °a. You may drive when you no longer are taking prescription pain medication, you can comfortably wear a seatbelt, and you can safely maneuver your car and apply brakes. °b. RETURN TO WORK:  __________n/a_______________ °You should see your doctor in the office for a follow-up appointment approximately three-four weeks after your surgery.   ° °WHEN TO CALL YOUR DOCTOR: °1. Fever over 101.0 °2. Nausea and/or vomiting. °3. Extreme swelling or  bruising. °4. Continued bleeding from incision. °5. Increased pain, redness, or drainage from the incision. ° °The clinic staff is available to answer your questions during regular business hours.  Please don’t hesitate to call and ask to speak to one of the nurses for clinical concerns.  If you have a medical emergency, go to the nearest emergency room or call 911.  A surgeon from [facility] is always on call at the hospital. ° °For further questions, please visit [email or web address]  ° ° °Post Anesthesia Home Care Instructions ° °Activity: °Get plenty of rest for the remainder of the day. A responsible adult should stay with you for 24 hours following the procedure.  °For the next 24 hours, DO NOT: °-Drive a car °-Operate machinery °-Drink alcoholic beverages °-Take any medication unless instructed by your physician °-Make any legal decisions or sign important papers. ° °Meals: °Start with liquid foods such as gelatin or soup. Progress to regular foods as tolerated. Avoid greasy, spicy, heavy foods. If nausea and/or vomiting occur, drink only clear liquids until the nausea and/or vomiting subsides. Call your physician if vomiting continues. ° °Special Instructions/Symptoms: °Your throat may feel dry or sore from the anesthesia or the breathing tube placed in your throat during surgery. If this causes discomfort, gargle with warm salt water. The discomfort should disappear within 24 hours. ° °If you had a scopolamine patch placed behind your ear for the management of post- operative nausea and/or vomiting: ° °1. The medication in the patch is effective for 72 hours, after which it should be   removed.  Wrap patch in a tissue and discard in the trash. Wash hands thoroughly with soap and water. °2. You may remove the patch earlier than 72 hours if you experience unpleasant side effects which may include dry mouth, dizziness or visual disturbances. °3. Avoid touching the patch. Wash your hands with  soap and water after contact with the patch. °  ° °

## 2015-08-31 ENCOUNTER — Encounter (HOSPITAL_BASED_OUTPATIENT_CLINIC_OR_DEPARTMENT_OTHER): Payer: Self-pay | Admitting: General Surgery

## 2015-09-01 ENCOUNTER — Telehealth: Payer: Self-pay | Admitting: Gynecologic Oncology

## 2015-09-01 NOTE — Telephone Encounter (Signed)
Informed patient of path review favoring endocervical origin.  No concerns voiced.  Advised to call for any needs.

## 2015-09-02 ENCOUNTER — Telehealth: Payer: Self-pay | Admitting: Oncology

## 2015-09-02 ENCOUNTER — Ambulatory Visit: Payer: BC Managed Care – PPO | Admitting: Gynecologic Oncology

## 2015-09-02 ENCOUNTER — Other Ambulatory Visit: Payer: Self-pay | Admitting: Oncology

## 2015-09-02 ENCOUNTER — Other Ambulatory Visit (HOSPITAL_BASED_OUTPATIENT_CLINIC_OR_DEPARTMENT_OTHER): Payer: BC Managed Care – PPO

## 2015-09-02 ENCOUNTER — Ambulatory Visit (HOSPITAL_BASED_OUTPATIENT_CLINIC_OR_DEPARTMENT_OTHER): Payer: BC Managed Care – PPO | Admitting: Nurse Practitioner

## 2015-09-02 ENCOUNTER — Other Ambulatory Visit: Payer: BC Managed Care – PPO

## 2015-09-02 ENCOUNTER — Other Ambulatory Visit: Payer: Self-pay

## 2015-09-02 ENCOUNTER — Telehealth: Payer: Self-pay

## 2015-09-02 DIAGNOSIS — C774 Secondary and unspecified malignant neoplasm of inguinal and lower limb lymph nodes: Secondary | ICD-10-CM

## 2015-09-02 DIAGNOSIS — C530 Malignant neoplasm of endocervix: Secondary | ICD-10-CM

## 2015-09-02 DIAGNOSIS — E8809 Other disorders of plasma-protein metabolism, not elsewhere classified: Secondary | ICD-10-CM

## 2015-09-02 DIAGNOSIS — C539 Malignant neoplasm of cervix uteri, unspecified: Secondary | ICD-10-CM

## 2015-09-02 DIAGNOSIS — E860 Dehydration: Secondary | ICD-10-CM | POA: Diagnosis not present

## 2015-09-02 DIAGNOSIS — R112 Nausea with vomiting, unspecified: Secondary | ICD-10-CM

## 2015-09-02 DIAGNOSIS — E876 Hypokalemia: Secondary | ICD-10-CM | POA: Diagnosis not present

## 2015-09-02 DIAGNOSIS — Z95828 Presence of other vascular implants and grafts: Secondary | ICD-10-CM

## 2015-09-02 DIAGNOSIS — C7989 Secondary malignant neoplasm of other specified sites: Secondary | ICD-10-CM

## 2015-09-02 DIAGNOSIS — G893 Neoplasm related pain (acute) (chronic): Secondary | ICD-10-CM

## 2015-09-02 DIAGNOSIS — E46 Unspecified protein-calorie malnutrition: Secondary | ICD-10-CM

## 2015-09-02 LAB — COMPREHENSIVE METABOLIC PANEL
ALBUMIN: 2 g/dL — AB (ref 3.5–5.0)
ALK PHOS: 67 U/L (ref 40–150)
ALT: 13 U/L (ref 0–55)
AST: 16 U/L (ref 5–34)
Anion Gap: 10 mEq/L (ref 3–11)
BUN: 21.9 mg/dL (ref 7.0–26.0)
CALCIUM: 8.9 mg/dL (ref 8.4–10.4)
CHLORIDE: 96 meq/L — AB (ref 98–109)
CO2: 30 mEq/L — ABNORMAL HIGH (ref 22–29)
CREATININE: 0.8 mg/dL (ref 0.6–1.1)
EGFR: 81 mL/min/{1.73_m2} — ABNORMAL LOW (ref >90)
Glucose: 131 mg/dl (ref 70–140)
Potassium: 3.4 mEq/L — ABNORMAL LOW (ref 3.5–5.1)
Sodium: 136 mEq/L (ref 136–145)
Total Bilirubin: 0.35 mg/dL (ref 0.20–1.20)
Total Protein: 6 g/dL — ABNORMAL LOW (ref 6.4–8.3)

## 2015-09-02 LAB — CBC WITH DIFFERENTIAL/PLATELET
BASO%: 0.1 % (ref 0.0–2.0)
Basophils Absolute: 0 10*3/uL (ref 0.0–0.1)
EOS%: 0.1 % (ref 0.0–7.0)
Eosinophils Absolute: 0 10*3/uL (ref 0.0–0.5)
HEMATOCRIT: 31.3 % — AB (ref 34.8–46.6)
HEMOGLOBIN: 10.1 g/dL — AB (ref 11.6–15.9)
LYMPH#: 1.1 10*3/uL (ref 0.9–3.3)
LYMPH%: 7.3 % — ABNORMAL LOW (ref 14.0–49.7)
MCH: 29.5 pg (ref 25.1–34.0)
MCHC: 32.3 g/dL (ref 31.5–36.0)
MCV: 91.5 fL (ref 79.5–101.0)
MONO#: 0.3 10*3/uL (ref 0.1–0.9)
MONO%: 1.9 % (ref 0.0–14.0)
NEUT#: 14.1 10*3/uL — ABNORMAL HIGH (ref 1.5–6.5)
NEUT%: 90.6 % — ABNORMAL HIGH (ref 38.4–76.8)
Platelets: 517 10*3/uL — ABNORMAL HIGH (ref 145–400)
RBC: 3.42 10*6/uL — ABNORMAL LOW (ref 3.70–5.45)
RDW: 19.5 % — AB (ref 11.2–14.5)
WBC: 15.6 10*3/uL — ABNORMAL HIGH (ref 3.9–10.3)

## 2015-09-02 MED ORDER — PROMETHAZINE HCL 25 MG/ML IJ SOLN
12.5000 mg | Freq: Once | INTRAMUSCULAR | Status: AC
  Administered 2015-09-02: 12.5 mg via INTRAVENOUS
  Filled 2015-09-02: qty 1

## 2015-09-02 MED ORDER — SODIUM CHLORIDE 0.9% FLUSH
10.0000 mL | INTRAVENOUS | Status: DC | PRN
  Administered 2015-09-02: 10 mL via INTRAVENOUS
  Filled 2015-09-02: qty 10

## 2015-09-02 MED ORDER — TRAMADOL HCL 50 MG PO TABS
50.0000 mg | ORAL_TABLET | Freq: Once | ORAL | Status: AC
  Administered 2015-09-02: 50 mg via ORAL

## 2015-09-02 MED ORDER — TRAMADOL HCL 50 MG PO TABS
ORAL_TABLET | ORAL | Status: AC
  Filled 2015-09-02: qty 1

## 2015-09-02 MED ORDER — SODIUM CHLORIDE 0.9 % IV SOLN
INTRAVENOUS | Status: AC
Start: 2015-09-02 — End: 2015-09-02
  Administered 2015-09-02: 12:00:00 via INTRAVENOUS

## 2015-09-02 MED ORDER — PROMETHAZINE HCL 25 MG PO TABS: 25.0000 mg | ORAL_TABLET | Freq: Four times a day (QID) | ORAL | Status: DC | PRN

## 2015-09-02 NOTE — Telephone Encounter (Signed)
Patient's sister in law called because patient has been vomiting and having diarrhea for the past 24 hours.  Patient is unable to even hold water down.  Patient refuses to call CC so Lauren did and is very concerned.  Writer called the patient who admits to feeling very sick- has been unable to hold anything down including fluids. Patient is coming in today to see Selena Lesser, NP with labs and fluids.

## 2015-09-02 NOTE — Telephone Encounter (Signed)
Medical Oncology  Called for peer to peer insurance re [facility] [phone]  MD not available in [place] at time of call  L.Marko Plume

## 2015-09-02 NOTE — Telephone Encounter (Signed)
Medical Oncology  Peer to peer discussion to request gCSF. Denied,  despite information that patient has been heavily pretreated with chemotherapy and pelvic/ paraaortic radiation, and has vesicovaginal and colovaginal fistulas. "Does not meet NCCN criteria"   Member #  [id number] [phone]   L.Marko Plume, MD

## 2015-09-05 ENCOUNTER — Telehealth: Payer: Self-pay | Admitting: *Deleted

## 2015-09-05 ENCOUNTER — Ambulatory Visit (HOSPITAL_COMMUNITY)
Admission: RE | Admit: 2015-09-05 | Discharge: 2015-09-05 | Disposition: A | Discharge status: Home / Self Care | Payer: BC Managed Care – PPO | Source: Ambulatory Visit | Attending: Oncology | Admitting: Oncology

## 2015-09-05 ENCOUNTER — Other Ambulatory Visit: Payer: Self-pay | Admitting: Oncology

## 2015-09-05 DIAGNOSIS — C539 Malignant neoplasm of cervix uteri, unspecified: Secondary | ICD-10-CM | POA: Diagnosis present

## 2015-09-05 DIAGNOSIS — C7989 Secondary malignant neoplasm of other specified sites: Secondary | ICD-10-CM

## 2015-09-05 DIAGNOSIS — R197 Diarrhea, unspecified: Secondary | ICD-10-CM

## 2015-09-05 NOTE — Telephone Encounter (Signed)
Pt reports she is still having diarrhea. Wants to know if Dr Marko Plume can give her something. Has used lomotil in past.

## 2015-09-05 NOTE — Telephone Encounter (Signed)
Pt states she is taking fluids. Drank bottle of water and boost this morning. Usually has to empty colostomy ~ 30 minutes after eating or drinking.   I spoke with her about possible need for IVF tomorrow vs starting doxil tomorrow. She is OK with that. Will come in 11:15 for labs/flush/ infusion as scheduled. Do you want her to be seen as well?

## 2015-09-05 NOTE — Progress Notes (Signed)
  Echocardiogram 2D Echocardiogram has been performed.  Stacy Webster 09/05/2015, 12:05 PM

## 2015-09-05 NOTE — Telephone Encounter (Signed)
-----   Message from Gordy Levan, MD sent at 09/05/2015 12:57 PM EDT -----  Patton Salles, RN at 09/05/2015 9:55 AM    Status: Signed      Expand All Collapse All    Pt reports she is still having diarrhea. Wants to know if Dr Marko Plume can give her something. Has used lomotil in past.    Looks like she saw Cyndee for diarrhea and vomiting on 4-7, counts ok, K 3.4 then. Please find out if she is able to keep down fluids now, how much diarrhea. I assume this is from the colostomy?  May need chem and C diff, provider, IVF.  Should not have first doxil on 4-11 as scheduled with these symptoms.

## 2015-09-06 ENCOUNTER — Encounter: Payer: Self-pay | Admitting: Nurse Practitioner

## 2015-09-06 ENCOUNTER — Other Ambulatory Visit (HOSPITAL_COMMUNITY)
Admission: RE | Admit: 2015-09-06 | Discharge: 2015-09-06 | Disposition: A | Discharge status: Home / Self Care | Payer: BC Managed Care – PPO | Source: Ambulatory Visit | Attending: Oncology | Admitting: Oncology

## 2015-09-06 ENCOUNTER — Telehealth: Payer: Self-pay | Admitting: Oncology

## 2015-09-06 ENCOUNTER — Ambulatory Visit (HOSPITAL_BASED_OUTPATIENT_CLINIC_OR_DEPARTMENT_OTHER): Payer: BC Managed Care – PPO

## 2015-09-06 ENCOUNTER — Other Ambulatory Visit (HOSPITAL_BASED_OUTPATIENT_CLINIC_OR_DEPARTMENT_OTHER): Payer: BC Managed Care – PPO

## 2015-09-06 ENCOUNTER — Telehealth: Payer: Self-pay

## 2015-09-06 ENCOUNTER — Ambulatory Visit: Payer: BC Managed Care – PPO

## 2015-09-06 VITALS — BP 128/76 | HR 99 | Temp 98.8°F | Resp 18

## 2015-09-06 DIAGNOSIS — C7989 Secondary malignant neoplasm of other specified sites: Secondary | ICD-10-CM

## 2015-09-06 DIAGNOSIS — R197 Diarrhea, unspecified: Secondary | ICD-10-CM

## 2015-09-06 DIAGNOSIS — Z95828 Presence of other vascular implants and grafts: Secondary | ICD-10-CM

## 2015-09-06 DIAGNOSIS — C530 Malignant neoplasm of endocervix: Secondary | ICD-10-CM

## 2015-09-06 DIAGNOSIS — E860 Dehydration: Secondary | ICD-10-CM | POA: Diagnosis not present

## 2015-09-06 DIAGNOSIS — C539 Malignant neoplasm of cervix uteri, unspecified: Secondary | ICD-10-CM

## 2015-09-06 DIAGNOSIS — E876 Hypokalemia: Secondary | ICD-10-CM | POA: Insufficient documentation

## 2015-09-06 DIAGNOSIS — R112 Nausea with vomiting, unspecified: Secondary | ICD-10-CM | POA: Insufficient documentation

## 2015-09-06 LAB — COMPREHENSIVE METABOLIC PANEL
ALT: <9 U/L (ref 0–55)
ANION GAP: 9 meq/L (ref 3–11)
AST: 10 U/L (ref 5–34)
Albumin: 2.1 g/dL — ABNORMAL LOW (ref 3.5–5.0)
Alkaline Phosphatase: 51 U/L (ref 40–150)
BUN: 11.3 mg/dL (ref 7.0–26.0)
CO2: 30 meq/L — AB (ref 22–29)
Calcium: 8.5 mg/dL (ref 8.4–10.4)
Chloride: 95 mEq/L — ABNORMAL LOW (ref 98–109)
Creatinine: 0.9 mg/dL (ref 0.6–1.1)
EGFR: 77 mL/min/{1.73_m2} — AB (ref >90)
GLUCOSE: 125 mg/dL (ref 70–140)
POTASSIUM: 3 meq/L — AB (ref 3.5–5.1)
SODIUM: 133 meq/L — AB (ref 136–145)
Total Bilirubin: <0.3 mg/dL (ref 0.20–1.20)
Total Protein: 5.9 g/dL — ABNORMAL LOW (ref 6.4–8.3)

## 2015-09-06 LAB — CBC WITH DIFFERENTIAL/PLATELET
BASO%: 0.4 % (ref 0.0–2.0)
BASOS ABS: 0 10*3/uL (ref 0.0–0.1)
EOS ABS: 0.1 10*3/uL (ref 0.0–0.5)
EOS%: 1.2 % (ref 0.0–7.0)
HCT: 29 % — ABNORMAL LOW (ref 34.8–46.6)
HGB: 9.5 g/dL — ABNORMAL LOW (ref 11.6–15.9)
LYMPH%: 5 % — AB (ref 14.0–49.7)
MCH: 29.8 pg (ref 25.1–34.0)
MCHC: 32.8 g/dL (ref 31.5–36.0)
MCV: 90.7 fL (ref 79.5–101.0)
MONO#: 1.1 10*3/uL — ABNORMAL HIGH (ref 0.1–0.9)
MONO%: 9.7 % (ref 0.0–14.0)
NEUT%: 83.7 % — AB (ref 38.4–76.8)
NEUTROS ABS: 9.6 10*3/uL — AB (ref 1.5–6.5)
PLATELETS: 485 10*3/uL — AB (ref 145–400)
RBC: 3.2 10*6/uL — AB (ref 3.70–5.45)
RDW: 21.6 % — ABNORMAL HIGH (ref 11.2–14.5)
WBC: 11.5 10*3/uL — AB (ref 3.9–10.3)
lymph#: 0.6 10*3/uL — ABNORMAL LOW (ref 0.9–3.3)

## 2015-09-06 LAB — C DIFFICILE QUICK SCREEN W PCR REFLEX
C DIFFICILE (CDIFF) INTERP: NEGATIVE
C DIFFICLE (CDIFF) ANTIGEN: NEGATIVE
C Diff toxin: NEGATIVE

## 2015-09-06 MED ORDER — SODIUM CHLORIDE 0.9% FLUSH
10.0000 mL | INTRAVENOUS | Status: DC | PRN
  Administered 2015-09-06: 10 mL via INTRAVENOUS
  Filled 2015-09-06: qty 10

## 2015-09-06 MED ORDER — HEPARIN SOD (PORK) LOCK FLUSH 100 UNIT/ML IV SOLN
500.0000 [IU] | Freq: Once | INTRAVENOUS | Status: AC
  Administered 2015-09-06: 500 [IU] via INTRAVENOUS
  Filled 2015-09-06: qty 5

## 2015-09-06 MED ORDER — SODIUM CHLORIDE 0.9 % IV SOLN
INTRAVENOUS | Status: DC
  Administered 2015-09-06: 13:00:00 via INTRAVENOUS
  Filled 2015-09-06: qty 1000

## 2015-09-06 MED ORDER — SODIUM CHLORIDE 0.9 % IV SOLN
Freq: Once | INTRAVENOUS | Status: AC
  Administered 2015-09-06: 13:00:00 via INTRAVENOUS

## 2015-09-06 NOTE — Telephone Encounter (Signed)
Added appt per LL 4/11 pof. Pt will get updated sch today

## 2015-09-06 NOTE — Telephone Encounter (Signed)
Spoke with [name] in the infusion room this afternoon. She states that she feels much better. Diarrhea stopped last evening.  Stool for C-diff sent from colostomy and was negative. Potassium is 3.0 today.  Pt receiving 20 meq of KCL with IV fluids.  [name]  Takes 10 meq of kcl daily.  She took her dose yesterday but missed a couple of days due to the nausea and vomiting.   Told her that [name] wants her to take 20 meq of potassium daily until chemistries repeated. [name] verbalized understanding. [name] is scheduled to see NP Mikey Bussing on 09-12-15 with 1st doxil after visit.  [name] will call Monday am if she is feeling ell and just come for lab and chemotherapy.  If she sees Stacy Webster, told her to ask Stacy Webster if [name] still wants to see her Thursday Webster as scheduled. Spoke with Stacy Webster pharmacist at [facility] to have Doxil treatment order date changed to 09-12-15.

## 2015-09-06 NOTE — Assessment & Plan Note (Signed)
Potassium is down to 3.4.  Patient was encouraged to increase to a potassium-rich diet.

## 2015-09-06 NOTE — Patient Instructions (Signed)

## 2015-09-06 NOTE — Progress Notes (Signed)
SYMPTOM MANAGEMENT CLINIC    Chief Complaint: Nausea, vomiting, diarrhea, and dehydration.   HPI:  Stacy Webster 55 y.o. female diagnosed with endometrial cancer.  Patient is status post chemotherapy and radiation treatments in the past; and plans to initiate Doxil chemotherapy next week.  Patient has been experiencing chronic nausea and vomiting.  She's also had some diarrhea into her colostomy bag as well.  She feels dehydrated today; and receive IV fluid rehydration.  Patient denies any recent fevers or chills.   No history exists.    Review of Systems  Constitutional: Positive for weight loss and malaise/fatigue. Negative for fever and chills.  Gastrointestinal: Positive for nausea, vomiting and diarrhea. Negative for abdominal pain, blood in stool and melena.  All other systems reviewed and are negative.   Past Medical History  Diagnosis Date  . Hydronephrosis, right   . Ureteral stricture, right   . History of septic shock     05/ 2016  due to Peptostriptococcus/  right hydronephrosis and obstruction----  resolved  . 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 Mild intermittent asthma   . History of Clostridium difficile     10-15-2014  resolved  . Left rotator cuff tear arthropathy   . White coat syndrome without hypertension   . GERD (gastroesophageal reflux disease)   . Frequency of urination   . Anemia associated with chemotherapy     Antineoplastic chemo  . Recurrent cervical cancer [facility]) oncologist-  dr Rossi/ dr Marko Plume    dx 12/ 2015---  Radiation and Chemo (07-06-2014 to 09-03-2014)  now recurrent ,  Stage IIB  w/ Mets to pelvic/ retroperitoneum---  CHEMOTHERAPY CURRENTLY  . 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 Cervical cancer ([place])   . Chronic constipation   . Urinary incontinence   . PONV (postoperative nausea and vomiting)     remote history -not recent  . Anxiety     Past Surgical History  Procedure Laterality Date  .  Cystoscopy w/ ureteral stent placement Right 10/13/2014    Procedure: CYSTOSCOPY WITH RETROGRADE PYELOGRAM/URETERAL STENT PLACEMENT;  Surgeon: Festus Aloe, MD;  Location: [facility];  Service: Urology;  Laterality: Right;  . Transthoracic echocardiogram  10-13-2014    mild LVH,  ef 55-60%/  mild TR  . Tonsillectomy and adenoidectomy  age 84    age 89--  redo-- Adenoidectomy  . Laparoscopic cholecystectomy  1990's  . Cystoscopy with ureteroscopy and stent placement Right 02/18/2015    Procedure: CYSTO WITH RIGHT URETERAL STENT EXCHANGE, RIGHT URETEROSCOPY ;  Surgeon: Festus Aloe, MD;  Location: [facility];  Service: Urology;  Laterality: Right;  . Cystoscopy with biopsy N/A 02/18/2015    Procedure: CYSTOSCOPY WITH  BLADDER  BIOPSY;  Surgeon: Festus Aloe, MD;  Location: [facility];  Service: Urology;  Laterality: N/A;  . Cystoscopy w/ retrogrades Right 02/18/2015    Procedure: CYSTOSCOPY WITH RETROGRADE PYELOGRAM;  Surgeon: Festus Aloe, MD;  Location: [facility];  Service: Urology;  Laterality: Right;  . Dilation and curettage of uterus N/A 03/25/2015    Procedure: DILATATION AND CURETTAGE WITH ULTRASOUND;  Surgeon: Everitt Amber, MD;  Location: [facility];  Service: Gynecology;  Laterality: N/A;  . Cystoscopy/retrograde/ureteroscopy Bilateral 06/21/2015    Procedure: CYSTOSCOPY; EXAM UNDER ANESTHESIA; BLADDER BIOPSIES WITH FULGARATION; BILATERAL RETROGRADE PYLOGRAM; RIGHT URETERAL STENT EXCHANGE AND LEFT URETERAL STENT PLACEMENT;  Surgeon: Festus Aloe, MD;  Location: [facility];  Service: Urology;  Laterality: Bilateral;  . Laparotomy N/A 08/05/2015    Procedure: LAPAROSCOPY AND DIVERTING OSTOMY;  Surgeon: Stark Klein, MD;  Location: [facility];  Service: General;  Laterality: N/A;  . 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 [facility]); Unintentional weight loss; Protein calorie malnutrition ([place]); Chemotherapy-induced peripheral neuropathy ([place]); Chemotherapy induced neutropenia ([place]); Antineoplastic chemotherapy induced anemia; Rotator cuff arthropathy; Recurrent cervical cancer ([place]); High risk medication use; Proteinuria; Gastritis and gastroduodenitis; Pyometrium; Other specified fever; Anemia due to acute blood loss; Dehydration; Hypoalbuminemia due to protein-calorie malnutrition ([place]); Ureteral obstruction, right; Ureteral obstruction, left; Vesicovaginal fistula; Anemia; Colovesical fistula; Hyponatremia; S/P colostomy ([place]); Severe protein-calorie malnutrition ([place]); 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    is allergic to morphine and related; zofran; codeine; hydrocodone; and peach.    Medication List       This list is accurate as of: 09/02/15 11:59 PM.  Always use your most recent med list.               albuterol 108 (90 Base) MCG/ACT inhaler  Commonly known as:  PROVENTIL HFA;VENTOLIN HFA  Inhale 2 puffs into the lungs every 6 (six) hours as needed for wheezing or shortness of breath. Reported on 08/29/2015     gabapentin 100 MG capsule  Commonly known as:  NEURONTIN  Take 2 capsules (200 mg total) by mouth 2 (two) times daily. Or as directed     HEMOCYTE 324 (106 Fe) MG Tabs tablet  Generic drug:  Ferrous Fumarate  Take 325 mg by mouth daily.  Take 1 tablet daily on empty stomach with Vitamin C tablet     ibuprofen 200 MG tablet  Commonly known as:  ADVIL,MOTRIN  Take 400 mg by mouth every 6 (six) hours as needed. Reported on 08/17/2015     lactose free nutrition Liqd  Take 237 mLs by mouth 3 (three) times daily with meals.     lidocaine-prilocaine cream  Commonly known as:  EMLA  Apply to port 1-2 hours before needle access, cover with plastic wrap.     LORazepam 1 MG tablet  Commonly known as:  ATIVAN  Place 1/2 - 1 tablet under the tongue or swallow evry 4-6 hrs as needed for nausea     Magnesium Oxide 500 MG Caps  Take 500 mg by mouth daily.     pantoprazole 40 MG tablet  Commonly known as:  PROTONIX  Take 1 tablet (40 mg total) by mouth every evening.     potassium chloride 10 MEQ tablet  Commonly known as:  K-DUR  Take 1 tablet (10 mEq total) by mouth daily.     promethazine 25 MG tablet  Commonly known as:  PHENERGAN  Take 1 tablet (25 mg total) by mouth every 6 (six) hours as needed for nausea or vomiting. Take 1/2 to 1 tablet by mouth every 6 hours as needed for nausea or vomiting     vitamin C 500 MG tablet  Commonly  known as:  ASCORBIC ACID  Take 500 mg by mouth daily.         PHYSICAL EXAMINATION  Oncology Vitals 09/06/2015 09/06/2015  Height - -  Weight - -  Weight (lbs) - -  BMI (kg/m2) - -  Temp 98.8 99.1  Pulse 99 105  Resp 18 18  SpO2 100 100  BSA (m2) - -   BP Readings from Last 2 Encounters:  09/06/15 128/76  08/30/15 120/67    Physical Exam  Constitutional: She is oriented to person, place, and time. She appears malnourished and dehydrated. She appears unhealthy. She appears cachectic.  HENT:  Head: Normocephalic and atraumatic.  Mouth/Throat: Oropharynx is clear and moist.  Eyes: Conjunctivae and EOM are normal. Pupils are equal, round, and reactive to light. Right eye exhibits no discharge. Left eye exhibits no discharge. No scleral icterus.  Neck: Normal range of motion.  Neck supple. No JVD present. No tracheal deviation present. No thyromegaly present.  Cardiovascular: Normal rate, regular rhythm, normal heart sounds and intact distal pulses.   Pulmonary/Chest: Effort normal and breath sounds normal. No respiratory distress. She has no wheezes. She has no rales. She exhibits no tenderness.  Abdominal: Soft. Bowel sounds are normal. She exhibits no distension and no mass. There is no tenderness. There is no rebound and no guarding.  Musculoskeletal: Normal range of motion. She exhibits no edema or tenderness.  Lymphadenopathy:    She has no cervical adenopathy.  Neurological: She is alert and oriented to person, place, and time.  Skin: Skin is warm and dry. No rash noted. No erythema. There is pallor.  Psychiatric: Affect normal.    LABORATORY DATA:. Appointment on 09/02/2015  Component Date Value Ref Range Status  . WBC 09/02/2015 15.6* 3.9 - 10.3 10e3/uL Final  . NEUT# 09/02/2015 14.1* 1.5 - 6.5 10e3/uL Final  . HGB 09/02/2015 10.1* 11.6 - 15.9 g/dL Final  . HCT 09/02/2015 31.3* 34.8 - 46.6 % Final  . Platelets 09/02/2015 517* 145 - 400 10e3/uL Final  . MCV 09/02/2015 91.5  79.5 - 101.0 fL Final  . MCH 09/02/2015 29.5  25.1 - 34.0 pg Final  . MCHC 09/02/2015 32.3  31.5 - 36.0 g/dL Final  . RBC 09/02/2015 3.42* 3.70 - 5.45 10e6/uL Final  . RDW 09/02/2015 19.5* 11.2 - 14.5 % Final  . lymph# 09/02/2015 1.1  0.9 - 3.3 10e3/uL Final  . MONO# 09/02/2015 0.3  0.1 - 0.9 10e3/uL Final  . Eosinophils Absolute 09/02/2015 0.0  0.0 - 0.5 10e3/uL Final  . Basophils Absolute 09/02/2015 0.0  0.0 - 0.1 10e3/uL Final  . NEUT% 09/02/2015 90.6* 38.4 - 76.8 % Final  . LYMPH% 09/02/2015 7.3* 14.0 - 49.7 % Final  . MONO% 09/02/2015 1.9  0.0 - 14.0 % Final  . EOS% 09/02/2015 0.1  0.0 - 7.0 % Final  . BASO% 09/02/2015 0.1  0.0 - 2.0 % Final  . Sodium 09/02/2015 136  136 - 145 mEq/L Final  . Potassium 09/02/2015 3.4* 3.5 - 5.1 mEq/L Final  . Chloride 09/02/2015 96* 98 -  109 mEq/L Final  . CO2 09/02/2015 30* 22 - 29 mEq/L Final  . Glucose 09/02/2015 131  70 - 140 mg/dl Final   Glucose reference range is for nonfasting patients. Fasting glucose reference range is 70- 100.  Marland Kitchen BUN 09/02/2015 21.9  7.0 - 26.0 mg/dL Final  . Creatinine 09/02/2015 0.8  0.6 - 1.1 mg/dL Final  . Total Bilirubin 09/02/2015 0.35  0.20 - 1.20  mg/dL Final  . Alkaline Phosphatase 09/02/2015 67  40 - 150 U/L Final  . AST 09/02/2015 16  5 - 34 U/L Final  . ALT 09/02/2015 13  0 - 55 U/L Final  . Total Protein 09/02/2015 6.0* 6.4 - 8.3 g/dL Final  . Albumin 09/02/2015 2.0* 3.5 - 5.0 g/dL Final  . Calcium 09/02/2015 8.9  8.4 - 10.4 mg/dL Final  . Anion Gap 09/02/2015 10  3 - 11 mEq/L Final  . EGFR 09/02/2015 81* >90 ml/min/1.73 m2 Final   eGFR is calculated using the CKD-EPI Creatinine Equation (2009)    RADIOGRAPHIC STUDIES: No results found.  ASSESSMENT/PLAN:    Cervical cancer [facility]) Patient is status post chemotherapy and radiation treatments.  She is scheduled to initiate Doxil chemotherapy regimen on 09/06/2015.  Patient has a Port-A-Cath placed on 08/30/2015.  Patient presented to the [facility] today with nausea/vomiting, some intermittent diarrhea to her colostomy bag, and dehydration.  See further notes for details.  Patient is scheduled for an echo on 09/05/2015.  She is scheduled to return for labs, flush, and her for cycle of new chemotherapy on 09/06/2015.  Dehydration Patient has been experiencing chronic nausea and vomiting.  She's also had some diarrhea into her colostomy bag as well.  She feels dehydrated today; and receive IV fluid rehydration.  Hypoalbuminemia due to protein-calorie malnutrition (HCC) Albumin has decreased to 2.0.  Patient was encouraged to push protein in her diet is much as possible.  Hypokalemia Potassium is down to 3.4.  Patient was encouraged to increase to a potassium-rich diet.  Nausea with vomiting Patient has been experiencing  chronic nausea and vomiting.  She's also had some diarrhea into her colostomy bag as well.  She feels dehydrated today; and receive IV fluid rehydration.  Patient was given Phenergan IV; which did greatly help with her nausea.  Patient was also given a prescription for Phenergan as well.  Cancer associated pain Patient requests tramadol for pain while she was at the cancer center.  Also, patient takes tramadol at home on an as-needed basis.  She does not need a refill at this time.     Patient stated understanding of all instructions; and was in agreement with this plan of care. The patient knows to call the clinic with any problems, questions or concerns.   Total time spent with patient was 25 minutes;  with greater than 75 percent of that time spent in face to face counseling regarding patient's symptoms,  and coordination of care and follow up.  Disclaimer:This dictation was prepared with Dragon/digital dictation along with [employer]. Any transcriptional errors that result from this process are unintentional.  Drue Second, NP 09/06/2015

## 2015-09-06 NOTE — Telephone Encounter (Signed)
-----   Message from Gordy Levan, MD sent at 09/06/2015  8:11 AM EDT ----- Labs seen and need follow up: can let her know heart function is good on the echo - fine to tell her when at office for IVF today

## 2015-09-06 NOTE — Assessment & Plan Note (Signed)
Patient is status post chemotherapy and radiation treatments.  She is scheduled to initiate Doxil chemotherapy regimen on 09/06/2015.  Patient has a Port-A-Cath placed on 08/30/2015.  Patient presented to the [facility] today with nausea/vomiting, some intermittent diarrhea to her colostomy bag, and dehydration.  See further notes for details.  Patient is scheduled for an echo on 09/05/2015.  She is scheduled to return for labs, flush, and her for cycle of new chemotherapy on 09/06/2015.

## 2015-09-06 NOTE — Assessment & Plan Note (Signed)
Patient has been experiencing chronic nausea and vomiting.  She's also had some diarrhea into her colostomy bag as well.  She feels dehydrated today; and receive IV fluid rehydration.

## 2015-09-06 NOTE — Assessment & Plan Note (Signed)
Patient has been experiencing chronic nausea and vomiting.  She's also had some diarrhea into her colostomy bag as well.  She feels dehydrated today; and receive IV fluid rehydration.  Patient was given Phenergan IV; which did greatly help with her nausea.  Patient was also given a prescription for Phenergan as well.

## 2015-09-06 NOTE — Assessment & Plan Note (Signed)
Patient requests tramadol for pain while she was at the cancer center.  Also, patient takes tramadol at home on an as-needed basis.  She does not need a refill at this time.

## 2015-09-06 NOTE — Assessment & Plan Note (Signed)
Albumin has decreased to 2.0.  Patient was encouraged to push protein in her diet is much as possible.

## 2015-09-11 ENCOUNTER — Other Ambulatory Visit: Payer: Self-pay | Admitting: Oncology

## 2015-09-11 DIAGNOSIS — C7989 Secondary malignant neoplasm of other specified sites: Secondary | ICD-10-CM

## 2015-09-12 ENCOUNTER — Ambulatory Visit (HOSPITAL_BASED_OUTPATIENT_CLINIC_OR_DEPARTMENT_OTHER): Payer: BC Managed Care – PPO | Admitting: Oncology

## 2015-09-12 ENCOUNTER — Other Ambulatory Visit (HOSPITAL_BASED_OUTPATIENT_CLINIC_OR_DEPARTMENT_OTHER): Payer: BC Managed Care – PPO

## 2015-09-12 ENCOUNTER — Ambulatory Visit: Payer: BC Managed Care – PPO

## 2015-09-12 ENCOUNTER — Ambulatory Visit (HOSPITAL_BASED_OUTPATIENT_CLINIC_OR_DEPARTMENT_OTHER): Payer: BC Managed Care – PPO

## 2015-09-12 ENCOUNTER — Encounter: Payer: Self-pay | Admitting: Oncology

## 2015-09-12 ENCOUNTER — Other Ambulatory Visit: Payer: BC Managed Care – PPO

## 2015-09-12 ENCOUNTER — Ambulatory Visit: Payer: BC Managed Care – PPO | Admitting: Oncology

## 2015-09-12 VITALS — BP 115/67 | HR 125 | Temp 98.3°F | Resp 18 | Ht 64.0 in | Wt 137.1 lb

## 2015-09-12 VITALS — BP 119/71 | HR 93

## 2015-09-12 DIAGNOSIS — C530 Malignant neoplasm of endocervix: Secondary | ICD-10-CM

## 2015-09-12 DIAGNOSIS — R634 Abnormal weight loss: Secondary | ICD-10-CM | POA: Diagnosis not present

## 2015-09-12 DIAGNOSIS — C774 Secondary and unspecified malignant neoplasm of inguinal and lower limb lymph nodes: Secondary | ICD-10-CM

## 2015-09-12 DIAGNOSIS — C7989 Secondary malignant neoplasm of other specified sites: Secondary | ICD-10-CM

## 2015-09-12 DIAGNOSIS — E860 Dehydration: Secondary | ICD-10-CM

## 2015-09-12 DIAGNOSIS — C539 Malignant neoplasm of cervix uteri, unspecified: Secondary | ICD-10-CM

## 2015-09-12 DIAGNOSIS — C541 Malignant neoplasm of endometrium: Secondary | ICD-10-CM

## 2015-09-12 DIAGNOSIS — Z95828 Presence of other vascular implants and grafts: Secondary | ICD-10-CM

## 2015-09-12 LAB — COMPREHENSIVE METABOLIC PANEL
ALBUMIN: 2.1 g/dL — AB (ref 3.5–5.0)
ALK PHOS: 57 U/L (ref 40–150)
ALT: 11 U/L (ref 0–55)
AST: 10 U/L (ref 5–34)
Anion Gap: 9 mEq/L (ref 3–11)
BUN: 17.7 mg/dL (ref 7.0–26.0)
CALCIUM: 8.7 mg/dL (ref 8.4–10.4)
CO2: 28 mEq/L (ref 22–29)
Chloride: 94 mEq/L — ABNORMAL LOW (ref 98–109)
Creatinine: 0.9 mg/dL (ref 0.6–1.1)
EGFR: 77 mL/min/{1.73_m2} — AB (ref >90)
Glucose: 134 mg/dl (ref 70–140)
POTASSIUM: 3.7 meq/L (ref 3.5–5.1)
SODIUM: 132 meq/L — AB (ref 136–145)
Total Bilirubin: <0.3 mg/dL (ref 0.20–1.20)
Total Protein: 6 g/dL — ABNORMAL LOW (ref 6.4–8.3)

## 2015-09-12 LAB — CBC WITH DIFFERENTIAL/PLATELET
BASO%: 0.2 % (ref 0.0–2.0)
BASOS ABS: 0 10*3/uL (ref 0.0–0.1)
EOS ABS: 0 10*3/uL (ref 0.0–0.5)
EOS%: 0.1 % (ref 0.0–7.0)
HEMATOCRIT: 30.9 % — AB (ref 34.8–46.6)
HEMOGLOBIN: 9.9 g/dL — AB (ref 11.6–15.9)
LYMPH%: 10.1 % — ABNORMAL LOW (ref 14.0–49.7)
MCH: 29.2 pg (ref 25.1–34.0)
MCHC: 32 g/dL (ref 31.5–36.0)
MCV: 91.2 fL (ref 79.5–101.0)
MONO#: 0.6 10*3/uL (ref 0.1–0.9)
MONO%: 5.3 % (ref 0.0–14.0)
NEUT#: 9 10*3/uL — ABNORMAL HIGH (ref 1.5–6.5)
NEUT%: 84.3 % — ABNORMAL HIGH (ref 38.4–76.8)
Platelets: 512 10*3/uL — ABNORMAL HIGH (ref 145–400)
RBC: 3.39 10*6/uL — ABNORMAL LOW (ref 3.70–5.45)
RDW: 19.1 % — AB (ref 11.2–14.5)
WBC: 10.7 10*3/uL — ABNORMAL HIGH (ref 3.9–10.3)
lymph#: 1.1 10*3/uL (ref 0.9–3.3)

## 2015-09-12 MED ORDER — HEPARIN SOD (PORK) LOCK FLUSH 100 UNIT/ML IV SOLN
500.0000 [IU] | Freq: Once | INTRAVENOUS | Status: AC
  Administered 2015-09-12: 500 [IU] via INTRAVENOUS
  Filled 2015-09-12: qty 5

## 2015-09-12 MED ORDER — SODIUM CHLORIDE 0.9% FLUSH
10.0000 mL | INTRAVENOUS | Status: DC | PRN
  Administered 2015-09-12: 10 mL via INTRAVENOUS
  Filled 2015-09-12: qty 10

## 2015-09-12 MED ORDER — SODIUM CHLORIDE 0.9 % IV SOLN
INTRAVENOUS | Status: DC
  Administered 2015-09-12: 15:00:00 via INTRAVENOUS

## 2015-09-12 NOTE — Patient Instructions (Signed)

## 2015-09-12 NOTE — Progress Notes (Signed)
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 Disease involves pelvis, low abdominal retroperitoneum into posterior cecum, and new bilateral inguinal nodes confirmed by biopsy 08-03-15.  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 She was hospitalized 3-6 thru 08-08-15, with diverting descending colostomy by Dr Barry Dienes 08-05-15; she has not wanted nephrostomy tubes to this point and ureteral stents were not helpful.  Last treatment was 7 cycles of CDDP taxol avastin thru 05-05-15.  She saw Dr [name] on 08-17-15, with discussion of palliative symptom management only vs further treatment in attempt to control disease. Patient desires further treatment. Recommendation is for doxil,  due to possible endometrial origin (see pathology 08-03-15 left inguinal node biopsy below) and prior chemo/ RT.   PAC, placed by Dr Barry Dienes 09-01-15.   Patient has not yet started her Doxil. She has been having nausea and vomiting off and on for the past week. She is scheduled to begin her first cycle of chemotherapy today, but reports she has been vomiting all day Sunday.  She requests IV fluids today ascitic chemotherapy. She is also been having stomach cramping. Yesterday she took Phenergan and Ativan which eventually helped her nausea and vomiting resolved. She has been able to eat today including toast and boost and has been able to keep fluids down as well. Weight is down by 3 pounds since 08/29/2015. 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 She uses gabapentin 100 mg in AM and some  tramadol, tho she feels that both of these medications may cause difficulty sleeping at night. Gabapentin does seem helpful with low pelvic pain and she will try increasing to 200 mg AM and midday. She has limited exercise tolerance, but "lower body tired" when she stands for any length of time. She is up at least 3x per night to change depends. No other bleeding. Not SOB at rest, no cough. Ostomy functioning well. 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 No LE swelling.   Remainder of 10 point Review of Systems negative/ unchanged.   PAC placed 09-01-15 by Dr Barry Dienes Refused flu vaccine Ureteral stents removed IV feraheme x 1 dose 08-02-15 PRBCs x 1 08-02-15  ONCOLOGIC HISTORY Patient had been in usual good health, with no previous abnormal PAP smears, when she developed frequent urination and vaginal bleeding in late 2015. She had endometrial biopsy by Dr Janyth Pupa [phone]) with adenocarcinoma. PET in [facility] 11-22-29 had hypermetabolic uptake within the uterine cervix and lower uterine segment and metastatic adenopathy within the bilateral pelvis and periaortic retroperitoneum. Dr Genia Del and Dr Marguerita Merles treated with pelvic and para-aortic radiation given with 6 cycles of sensitizing CDDP, and HDR x5 from 07-06-14 thru 09-03-14. Patient had a difficult time with nausea, vomiting, diarrhea and hypokalemia/ hypomagnesemia thru treatment. Patient used ODT zofran and ativan during chemotherapy. She had a difficult time doing oral prehydration for CDDP. She had no peripheral neuropathy with the CDDP. There was concern by completion of the treatment that she did not have optimal response, with plan for repeat evaluation at 6 weeks from completion of the treatment. Prior to planned reevaluation, patient began having daily fevers for possibly  4 weeks, as high as 102 degrees x 2 weeks prior to 10-11-14; by presentation at ED she also had increased right flank pain. She had  CT at [facility] on 10-08-14, this compared with PET of 05-2014 showed increase in subcapsular fluid collection right kidney and right hydronephrosis, increase in size of endometrial canal/ uterus/ right adnexa, and improvement in retroperitoneal, common iliac and bilateral pelvic sidewall adenopathy. She was admitted to [facility] ICU 5-16/ 10-12-14 with sepsis from peptostreptococcus. She had percutaneous drainage by IR of 635 cc dark thin fluid from right perinephric area on 10-12-14, with perinephric drain removed by urology on 10-18-14. She had right ureteral stent placement by Dr Junious Silk on 10-13-14. She developed C.difficile diarrhea, begun on flagyl 10-15-14 x 2 weeks, also on ceftin during that time. She saw Dr [name] in consultation on 11-05-14, with cervix grossly normal appearance, firm to palpation with right anterior extension, and purulent material from cervical os with manipulation, no palpable adnexal masses, induration R>L parametrium. PET 06-02-15 had hypermetabolic tissue thickening at cecum, + small right sided mesenteric and inguinal nodes, foci in myometriium and in region of cervix, small right perinephric fluid collection and dilated fluid filled endometrium. Dr [name] saw her following the PET, with recommendation for systemic treatment of the metastatic disease with CDDP, taxol and avastin. First CDDP Taxol 11-19-14, neutropenic day 14. Repeat CT showed essentially stable, minimal right perinephric fluid on 12-06-14, prior to cycle 2 CDDP taxol 7-14 with first avastin 12-10-14 and neulasta. CT AP 02-21-15 after 4 cycles CDDP taxol/ 3 cycles avastin showed improvement. Cystoscopy, bladder biopsy with fulguration, right diagnostic ureteroscopy,right retrograde pyelogram, and right ureteral stent exchange by Dr Junious Silk on 02-18-15. 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 She was hospitalized with pyometria 02-2015, then recurrent symptoms treated outpatient 03-2015. She had cycle 7 taxol CDDP avastin on 12-7 and 05-05-15. She developed symptomatic vesicovaginal fistula in 05-2015, ureteral stents not helpful and were removed. 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   Objective:  Vital signs in last 24 hours:  BP 115/67 mmHg  Pulse 125  Temp(Src) 98.3 F (36.8 C) (Oral)  Resp 18  Ht _0  (1.626 m)  Wt 137 lb 1.6 oz (62.188 kg)  BMI 23.52 kg/m2  SpO2 100%  LMP 11/05/2011 Weight down 3 lbs. Alert, oriented and appropriate. Ambulatory without assistance, respirations not labored RA, looks reasonably comfortable sitting in exam room.   No alopecia  HEENT:PERRL, sclerae not icteric. Oral mucosa moist without lesions, posterior pharynx clear. No JVD.  Lymphatics:no supraclavicular adenopathy Resp: clear to auscultation bilaterally and normal percussion bilaterally Cardio: regular rate and rhythm. No gallop. GI: soft, nontender, not distended, no mass or organomegaly. Normally active bowel sounds. Ostomy with soft formed stool, appliance functioning well.  Musculoskeletal/ Extremities: without pitting edema, cords, tenderness Neuro: stable peripheral neuropathy. Otherwise nonfocal. PSYCH appropriate mood and affect Skin without rash, ecchymosis, petechiae   Lab Results:  Results for orders placed or performed in visit on 09/12/15  CBC with Differential  Result Value Ref Range   WBC 10.7 (H) 3.9 - 10.3 10e3/uL   NEUT# 9.0 (H) 1.5 - 6.5 10e3/uL   HGB 9.9 (L) 11.6 - 15.9 g/dL   HCT 30.9 (L) 34.8 - 46.6 %   Platelets 512 (  H) 145 - 400 10e3/uL   MCV 91.2 79.5 - 101.0 fL   MCH 29.2 25.1 - 34.0 pg   MCHC 32.0 31.5 - 36.0 g/dL   RBC 3.39 (L) 3.70 - 5.45 10e6/uL   RDW 19.1 (H) 11.2 - 14.5 %   lymph# 1.1 0.9 -  3.3 10e3/uL   MONO# 0.6 0.1 - 0.9 10e3/uL   Eosinophils Absolute 0.0 0.0 - 0.5 10e3/uL   Basophils Absolute 0.0 0.0 - 0.1 10e3/uL   NEUT% 84.3 (H) 38.4 - 76.8 %   LYMPH% 10.1 (L) 14.0 - 49.7 %   MONO% 5.3 0.0 - 14.0 %   EOS% 0.1 0.0 - 7.0 %   BASO% 0.2 0.0 - 2.0 %  Comprehensive metabolic panel  Result Value Ref Range   Sodium 132 (L) 136 - 145 mEq/L   Potassium 3.7 3.5 - 5.1 mEq/L   Chloride 94 (L) 98 - 109 mEq/L   CO2 28 22 - 29 mEq/L   Glucose 134 70 - 140 mg/dl   BUN 17.7 7.0 - 26.0 mg/dL   Creatinine 0.9 0.6 - 1.1 mg/dL   Total Bilirubin <0.30 0.20 - 1.20 mg/dL   Alkaline Phosphatase 57 40 - 150 U/L   AST 10 5 - 34 U/L   ALT 11 0 - 55 U/L   Total Protein 6.0 (L) 6.4 - 8.3 g/dL   Albumin 2.1 (L) 3.5 - 5.0 g/dL   Calcium 8.7 8.4 - 10.4 mg/dL   Anion Gap 9 3 - 11 mEq/L   EGFR 77 (L) >90 ml/min/1.73 m2     Studies/Results: EXAM: CT ABDOMEN AND PELVIS WITHOUT CONTRAST  08-01-15  TECHNIQUE: Multidetector CT imaging of the abdomen and pelvis was performed following the standard protocol without IV contrast.  COMPARISON: 06/09/2015  FINDINGS: Lower chest: The visualized portions of the lung bases and heart are clear.  Hepatobiliary: Status post cholecystectomy. The liver appears normal.  Pancreas: Normal  Spleen: Normal  Adrenals/Urinary Tract: Adrenal glands are normal. Right kidney is smaller than the left. Perinephric right fluid collection unchanged. Bilateral ureteral stents present. Bladder is decompressed with wall thickening and there is air within the bladder.  Stomach/Bowel: The stomach is normal. Small bowel is normal except for stable wall thickening of the distal ileum. There is significant diffuse rectal wall thickening, slightly more prominent when compared to the prior study. Wall thickening also involves nearly the entire sigmoid colon, which represents a change from prior study.  Vascular/Lymphatic: No evidence of aortic dilatation.  No significant retroperitoneal adenopathy. Index right inguinal lymph node previously measured 13 mm and measures 13 mm once again today. Left inguinal lymph node series 2, image number 84 is larger, measuring 16 mm on the current examination. It was not enlarged previously. On image number 78 left inguinal adenopathy measures 18 mm in diameter as opposed to 7 mm previously.  Reproductive: There are unchanged calcifications involving the endometrium and are again likely related to fibroids. When compared to the prior study there is increased air within the endometrial canal. There is also again air in the region of the vaginal fornices and cervical canal. Oral contrast is also seen into the endocervical canal. Seen best on sagittal images, there is a thin tract of contrast extending anteriorly from the cervix to the bladder. This is seen best on sagittal series image number 60.  Stable bilateral ovarian cysts or follicles.  Other: No significant ascites  Musculoskeletal: No acute musculoskeletal findings  IMPRESSION: 1. Increased inguinal adenopathy  2. Stable radiation enteritis  3. Stable right perinephric fluid and ureteral stents. No hydronephrosis.  4. The presence of abnormal air is noted within the endometrium and within the bladder, implying the possibility of infection and also of fistula to both structures. On this examination which was performed with oral contrast only, the presence of contrast within the cervical canal confirms known rectovaginal fistula. Seen best on the sagittal images, there also appears to be fistula extending anteriorly from the cervical region to the bladder.  5. Increased wall thickening of the rectum and most of the sigmoid colon, suggesting possibility of radiation colitis.    [place], [place] [facility] Collected: 08/03/2015 Client: [facility] Accession: [id number] Received: 08/04/2015 John "Ronny Bacon, MD REPORT OF  SURGICAL PATHOLOGYNAL DIAGNOSIS Diagnosis Lymph node, needle/core biopsy, left inguinal METASTATIC CARCINOMA. 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 The neoplasm stains positive for ck8/18, ck903, focal ck20, negative for ER, p16 , cd68 and p63.   ECHOCARDIOGRAM  09-05-15 with EF 55-60%  Medications: I have reviewed the patient's current medications.  DISCUSSION Interval history reviewed as above.  The patient has had vomiting over the weekend and we will hold off on her chemotherapy today. She will receive 1 L of normal saline here in our office today. Potassium level is normal , and she will continue her current dose of potassium supplementation. She is a hearty scheduled back later this week and we will add on a 2 hour infusion for possible first cycle of Doxil versus additional IV fluids.   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 Treated with 7 cycles of CDDP taxol avastin thru 05-05-15. Progressive disease involving inguinal nodes, contraindications to continuing same regimen. Plan to begin doxil  Later this week if nausea and vomiting have resolved. 2. Vesicovaginal fistula, bilateral ureteral stents 06-21-15 did not improve situation and were removed. Urology has discussed percutaneous nephrostomy tubes, however patient prefers to manage with incontinence for now. 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 Functioning well and patient is tolerating well, Dr Marlowe Aschoff help much appreciated.  4.progressive weight loss with poor po intake related to above, severe protein calorie malnutrition: oral nutrition  better and some weight gain now, encouraged to continue. Hypoalbuminemia. Request to [facility] nutritionist to follow. 5.difficult peripheral IV access.  Port-A-Cath was placed by Dr. Barry Dienes on 09/01/2015. 6.iron deficiency related to ongoing blood loss from stents, blood draws. IV iron x first dose 08-02-15, PRBCs also 08-02-15. Likely can give second feraheme after PAC. Dislikes oral iron.  7.history of urosepsis and C diff in 2016 8.refused flu vaccine. 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  Plan reviewed with [name].  Cc PCP, Drs [name], Bing Ree, [place]. Time spent 30in including >50% counseling and coordination of care.      Mikey Bussing, NP   09/12/2015, 4:20 PM

## 2015-09-12 NOTE — Patient Instructions (Signed)

## 2015-09-14 ENCOUNTER — Telehealth: Payer: Self-pay | Admitting: Oncology

## 2015-09-14 ENCOUNTER — Telehealth: Payer: Self-pay | Admitting: *Deleted

## 2015-09-14 NOTE — Telephone Encounter (Signed)
s.w. pt and advised on 4.20 and 4.21 appts pt ok and aware

## 2015-09-14 NOTE — Telephone Encounter (Signed)
Per staff message and POF I have scheduled appts. Advised scheduler of appts moved to 4/21 due to high risk drug JMW

## 2015-09-15 ENCOUNTER — Other Ambulatory Visit (HOSPITAL_BASED_OUTPATIENT_CLINIC_OR_DEPARTMENT_OTHER): Payer: BC Managed Care – PPO

## 2015-09-15 ENCOUNTER — Telehealth: Payer: Self-pay | Admitting: Oncology

## 2015-09-15 ENCOUNTER — Encounter: Payer: BC Managed Care – PPO | Admitting: Nutrition

## 2015-09-15 ENCOUNTER — Ambulatory Visit (HOSPITAL_BASED_OUTPATIENT_CLINIC_OR_DEPARTMENT_OTHER): Payer: BC Managed Care – PPO

## 2015-09-15 ENCOUNTER — Other Ambulatory Visit: Payer: Self-pay

## 2015-09-15 ENCOUNTER — Encounter: Payer: Self-pay | Admitting: Oncology

## 2015-09-15 ENCOUNTER — Ambulatory Visit (HOSPITAL_BASED_OUTPATIENT_CLINIC_OR_DEPARTMENT_OTHER): Payer: BC Managed Care – PPO | Admitting: Oncology

## 2015-09-15 VITALS — BP 111/81 | HR 122 | Temp 98.0°F | Resp 18 | Ht 64.0 in | Wt 139.6 lb

## 2015-09-15 DIAGNOSIS — Z95828 Presence of other vascular implants and grafts: Secondary | ICD-10-CM | POA: Insufficient documentation

## 2015-09-15 DIAGNOSIS — D500 Iron deficiency anemia secondary to blood loss (chronic): Secondary | ICD-10-CM | POA: Diagnosis not present

## 2015-09-15 DIAGNOSIS — N824 Other female intestinal-genital tract fistulae: Secondary | ICD-10-CM

## 2015-09-15 DIAGNOSIS — C539 Malignant neoplasm of cervix uteri, unspecified: Secondary | ICD-10-CM

## 2015-09-15 DIAGNOSIS — C530 Malignant neoplasm of endocervix: Secondary | ICD-10-CM | POA: Diagnosis not present

## 2015-09-15 DIAGNOSIS — N820 Vesicovaginal fistula: Secondary | ICD-10-CM

## 2015-09-15 DIAGNOSIS — C541 Malignant neoplasm of endometrium: Secondary | ICD-10-CM

## 2015-09-15 DIAGNOSIS — C801 Malignant (primary) neoplasm, unspecified: Secondary | ICD-10-CM

## 2015-09-15 DIAGNOSIS — C7989 Secondary malignant neoplasm of other specified sites: Secondary | ICD-10-CM | POA: Diagnosis not present

## 2015-09-15 LAB — BASIC METABOLIC PANEL
ANION GAP: 10 meq/L (ref 3–11)
BUN: 18.2 mg/dL (ref 7.0–26.0)
CALCIUM: 8.5 mg/dL (ref 8.4–10.4)
CO2: 27 meq/L (ref 22–29)
Chloride: 93 mEq/L — ABNORMAL LOW (ref 98–109)
Creatinine: 0.9 mg/dL (ref 0.6–1.1)
EGFR: 76 mL/min/{1.73_m2} — AB (ref >90)
GLUCOSE: 111 mg/dL (ref 70–140)
Potassium: 3.5 mEq/L (ref 3.5–5.1)
Sodium: 131 mEq/L — ABNORMAL LOW (ref 136–145)

## 2015-09-15 MED ORDER — SODIUM CHLORIDE 0.9 % IJ SOLN
10.0000 mL | INTRAMUSCULAR | Status: DC | PRN
  Administered 2015-09-15: 10 mL via INTRAVENOUS
  Filled 2015-09-15: qty 10

## 2015-09-15 MED ORDER — HEPARIN SOD (PORK) LOCK FLUSH 100 UNIT/ML IV SOLN
500.0000 [IU] | Freq: Once | INTRAVENOUS | Status: AC | PRN
  Administered 2015-09-15: 500 [IU] via INTRAVENOUS
  Filled 2015-09-15: qty 5

## 2015-09-15 NOTE — Progress Notes (Signed)
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 Plan is for first doxil 09-16-15, this delayed from 09-12-15 due to vomiting and diarrhea (C diff negative 09-06-15). Dr [name] mentioned 2 cycles then repeat scans to patient and family. She had diverting colostomy by Dr Barry Dienes 08-05-15  Patient had eaten high fiber diet x several days prior to vomiting multiple times on 09-11-15, then liquid stool via ostomy, filling bag 4-5x / day next several days. Stools were formed today and she has had no vomiting since 09-11-15 pm. She was seen by APP on 09-12-15, doxil held and IVF given. No one was ill at home, she had no fever, and labs checked on 4-17 as noted.  She continued oral mag oxide even with diarrhea, that from previous CDDP and will stop now.  She denies SOB. Most pain was across upper abdomen with the GI problems, not as bothersome now in low pelvis. Skin across perineum was irritated where continuously incontinent of urine, better since using desitin as barrier now. No problems with PAC. No bleeding. No increased swelling LE.  Remainder of 10 point Review of Systems unchanged.   PAC by Dr Barry Dienes 08-30-15. Refused flu vaccine Ureteral stents removed IV feraheme x 1 dose 08-02-15 PRBCs x 1 08-02-15  ONCOLOGIC HISTORY Patient had been in usual good health, with no previous abnormal PAP smears, when she developed frequent urination and vaginal bleeding in late 2015. She had endometrial biopsy by Dr Janyth Pupa [phone]) with adenocarcinoma. PET in [facility] 06-08-14  had hypermetabolic uptake within the uterine cervix and lower uterine segment and metastatic adenopathy within the bilateral pelvis and periaortic retroperitoneum. Dr Genia Del and Dr Marguerita Merles treated with pelvic and para-aortic radiation given with 6 cycles of sensitizing CDDP, and HDR x5 from 07-06-14 thru 09-03-14. Patient had a difficult time with nausea, vomiting, diarrhea and hypokalemia/ hypomagnesemia thru treatment. Patient used ODT zofran and ativan during chemotherapy. She had a difficult time doing oral prehydration for CDDP. She had no peripheral neuropathy with the CDDP. There was concern by completion of the treatment that she did not have optimal response, with plan for repeat evaluation at 6 weeks from completion of the treatment. Prior to planned reevaluation, patient began having daily fevers for possibly 4 weeks, as high as 102 degrees x 2 weeks prior to 10-11-14; by presentation at ED she also had increased right flank pain. She had CT at [facility] on 10-08-14, this compared with PET of 05-2014 showed increase in subcapsular fluid collection right kidney and right hydronephrosis, increase in size of endometrial canal/ uterus/ right adnexa, and improvement in retroperitoneal, common iliac and bilateral pelvic sidewall adenopathy. She was admitted to [facility] ICU 5-16/ 10-12-14 with sepsis from peptostreptococcus. She had percutaneous drainage by IR of 635 cc dark thin fluid from right perinephric area on 10-12-14, with perinephric drain removed by urology on 10-18-14. She had right ureteral stent placement by Dr Junious Silk on 10-13-14. She developed C.difficile diarrhea, begun on flagyl 10-15-14 x 2 weeks, also on ceftin during that time. She saw Dr [name] in consultation on 11-05-14, with cervix grossly normal appearance,  firm to palpation with right anterior extension, and purulent material from cervical os with manipulation, no palpable adnexal masses, induration  R>L parametrium. PET 07-31-34 had hypermetabolic tissue thickening at cecum, + small right sided mesenteric and inguinal nodes, foci in myometriium and in region of cervix, small right perinephric fluid collection and dilated fluid filled endometrium. Dr [name] saw her following the PET, with recommendation for systemic treatment of the metastatic disease with CDDP, taxol and avastin. First CDDP Taxol 11-19-14, neutropenic day 14. Repeat CT showed essentially stable, minimal right perinephric fluid on 12-06-14, prior to cycle 2 CDDP taxol 7-14 with first avastin 12-10-14 and neulasta. CT [facility] 02-21-15 after 4 cycles CDDP taxol/ 3 cycles avastin showed improvement. Cystoscopy, bladder biopsy with fulguration, right diagnostic ureteroscopy,right retrograde pyelogram, and right ureteral stent exchange by Dr Junious Silk on 02-18-15. 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 She was hospitalized with pyometria 02-2015, then recurrent symptoms treated outpatient 03-2015. She had cycle 7 taxol CDDP avastin on 12-7 and 05-05-15. She developed symptomatic vesicovaginal fistula in 05-2015, ureteral stents not helpful and were removed. 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  First doxil 09-12-15.  Objective:  Vital signs in last 24 hours:  BP 111/81 mmHg  Pulse 122  Temp(Src) 98 F (36.7 C) (Oral)  Resp 18  Ht _0  (1.626 m)  Wt 139 lb 9.6 oz (63.322 kg)  BMI 23.95 kg/m2  SpO2 100%  LMP 11/05/2011 Weight up 1 lb Alert, oriented and appropriate. Ambulatory without assistance, able to get on and off exam table with minimal assistance. Looks stronger than in past couple of months. Hair has grown back  HEENT:PERRL, sclerae not icteric. Oral mucosa moist without lesions,  posterior pharynx clear.  Neck supple. No JVD.  Lymphatics:no supraclavicular adenopathy Resp: clear to auscultation bilaterally and normal percussion bilaterally Cardio: regular rate and rhythm. No gallop. GI: diffusely full, nontender, no clear organomegaly. Active bowel sounds not high pitched or rushing. Soft formed stool in ostomy Musculoskeletal/ Extremities: without pitting edema, cords, tenderness Neuro: no peripheral neuropathy. Otherwise nonfocal. PSYCH appropriate mood and affect Skin without rash, ecchymosis, petechiae Portacath-without erythema or tenderness  Lab Results:  Results for orders placed or performed in visit on 14/43/15  Basic metabolic panel  Result Value Ref Range   Sodium 131 (L) 136 - 145 mEq/L   Potassium 3.5 3.5 - 5.1 mEq/L   Chloride 93 (L) 98 - 109 mEq/L   CO2 27 22 - 29 mEq/L   Glucose 111 70 - 140 mg/dl   BUN 18.2 7.0 - 26.0 mg/dL   Creatinine 0.9 0.6 - 1.1 mg/dL   Calcium 8.5 8.4 - 10.4 mg/dL   Anion Gap 10 3 - 11 mEq/L   EGFR 76 (L) >90 ml/min/1.73 m2     Studies/Results: Echocardiogram 09-05-15   55-60% EF No results found.  Medications: I have reviewed the patient's current medications. Using neurontin 100 mg bid now. Stop mag oxide.  Will give #2 feraheme coordinating with next appointment. Will give additional IVF with doxil if able to add that to infusion time, as patient has always had difficulty drinking fluids adequately  DISCUSSION Interval history as above.  She wants to proceed with doxil on 09-16-15 as scheduled now. Reviewed possible doxil side effects.  Reviewed skin care, recommended  oral ice from cycle 1, and ice packs to hands and feet starting with at least cycle 2. She agrees to second feraheme, will try to coordinate with my next apt.   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 Treated with 7 cycles of CDDP taxol avastin thru 05-05-15. Progressive disease involving inguinal nodes, contraindications to continuing same regimen. First doxil 09-16-15, lab + MD ~ 2 weeks later. 2. Vesicovaginal fistula, bilateral ureteral stents 06-21-15 did not improve situation and were removed. Urology has discussed percutaneous nephrostomy tubes, however patient prefers to manage with incontinence for now. 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 Functioning well and patient is tolerating well, Dr Marlowe Aschoff help much appreciated.  4.progressive weight loss with poor po intake related to above, severe protein calorie malnutrition: oral nutrition better and some weight gain now, encouraged to continue. Hypoalbuminemia. Request to [facility] nutritionist to follow. Probably better without very high fiber diet. 5.PAC placed by Dr Barry Dienes 6.iron deficiency related to ongoing blood loss from stents, blood draws. IV iron x first dose 08-02-15, PRBCs also 08-02-15. Will give second feraheme using PAC. Dislikes oral iron.  7.history of urosepsis and C diff in 2016. C diff negative 09-06-15. 8.refused flu vaccine. 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 Chemo and feraheme orders placed and confirmed. Time spent 25 min including >50% counseling and coordination of care.    Gordy Levan, MD   09/17/2015, 3:43 PM

## 2015-09-15 NOTE — Patient Instructions (Signed)

## 2015-09-15 NOTE — Telephone Encounter (Signed)
appt made and pt will get updated sch at 4/21 visit

## 2015-09-16 ENCOUNTER — Ambulatory Visit (HOSPITAL_BASED_OUTPATIENT_CLINIC_OR_DEPARTMENT_OTHER): Payer: BC Managed Care – PPO

## 2015-09-16 VITALS — BP 114/66 | HR 85 | Temp 98.4°F | Resp 18

## 2015-09-16 DIAGNOSIS — C801 Malignant (primary) neoplasm, unspecified: Secondary | ICD-10-CM | POA: Diagnosis not present

## 2015-09-16 DIAGNOSIS — C7989 Secondary malignant neoplasm of other specified sites: Secondary | ICD-10-CM

## 2015-09-16 DIAGNOSIS — C539 Malignant neoplasm of cervix uteri, unspecified: Secondary | ICD-10-CM

## 2015-09-16 DIAGNOSIS — Z5111 Encounter for antineoplastic chemotherapy: Secondary | ICD-10-CM

## 2015-09-16 DIAGNOSIS — C786 Secondary malignant neoplasm of retroperitoneum and peritoneum: Secondary | ICD-10-CM | POA: Diagnosis not present

## 2015-09-16 MED ORDER — PROCHLORPERAZINE EDISYLATE 5 MG/ML IJ SOLN
INTRAMUSCULAR | Status: AC
  Filled 2015-09-16: qty 2

## 2015-09-16 MED ORDER — SODIUM CHLORIDE 0.9% FLUSH
10.0000 mL | INTRAVENOUS | Status: DC | PRN
  Administered 2015-09-16: 10 mL
  Filled 2015-09-16: qty 10

## 2015-09-16 MED ORDER — HEPARIN SOD (PORK) LOCK FLUSH 100 UNIT/ML IV SOLN
500.0000 [IU] | Freq: Once | INTRAVENOUS | Status: AC | PRN
  Administered 2015-09-16: 500 [IU]
  Filled 2015-09-16: qty 5

## 2015-09-16 MED ORDER — DEXTROSE 5 % IV SOLN
Freq: Once | INTRAVENOUS | Status: AC
  Administered 2015-09-16: 10:00:00 via INTRAVENOUS

## 2015-09-16 MED ORDER — DOXORUBICIN HCL LIPOSOMAL CHEMO INJECTION 2 MG/ML
42.0000 mg/m2 | Freq: Once | INTRAVENOUS | Status: AC
  Administered 2015-09-16: 70 mg via INTRAVENOUS
  Filled 2015-09-16: qty 25

## 2015-09-16 MED ORDER — PROCHLORPERAZINE EDISYLATE 5 MG/ML IJ SOLN
10.0000 mg | Freq: Once | INTRAMUSCULAR | Status: AC
  Administered 2015-09-16: 10 mg via INTRAVENOUS

## 2015-09-16 MED ORDER — SODIUM CHLORIDE 0.9 % IV SOLN
10.0000 mg | Freq: Once | INTRAVENOUS | Status: AC
  Administered 2015-09-16: 10 mg via INTRAVENOUS
  Filled 2015-09-16: qty 1

## 2015-09-16 MED ORDER — SODIUM CHLORIDE 0.9 % IV SOLN
INTRAVENOUS | Status: DC
  Administered 2015-09-16: 10:00:00 via INTRAVENOUS

## 2015-09-16 NOTE — Patient Instructions (Signed)
[facility] Discharge Instructions for Patients Receiving Chemotherapy  Today you received the following chemotherapy agents doxil  To help prevent nausea and vomiting after your treatment, we encourage you to take your nausea medication as directed   If you develop nausea and vomiting that is not controlled by your nausea medication, call the clinic.   BELOW ARE SYMPTOMS THAT SHOULD BE REPORTED IMMEDIATELY:  *FEVER GREATER THAN 100.5 F  *CHILLS WITH OR WITHOUT FEVER  NAUSEA AND VOMITING THAT IS NOT CONTROLLED WITH YOUR NAUSEA MEDICATION  *UNUSUAL SHORTNESS OF BREATH  *UNUSUAL BRUISING OR BLEEDING  TENDERNESS IN MOUTH AND THROAT WITH OR WITHOUT PRESENCE OF ULCERS  *URINARY PROBLEMS  *BOWEL PROBLEMS  UNUSUAL RASH Items with * indicate a potential emergency and should be followed up as soon as possible.  Feel free to call the clinic you have any questions or concerns. The clinic phone number is [phone].  Doxorubicin Liposomal injection What is this medicine? LIPOSOMAL DOXORUBICIN (LIP oh som al dox oh ROO bi sin) is a chemotherapy drug. This medicine is used to treat many kinds of cancer like Kaposi's sarcoma, multiple myeloma, and ovarian cancer. This medicine may be used for other purposes; ask your health care provider or pharmacist if you have questions. What should I tell my health care provider before I take this medicine? They need to know if you have any of these conditions: -blood disorders -heart disease -infection (especially a virus infection such as chickenpox, cold sores, or herpes) -liver disease -recent or ongoing radiation therapy -an unusual or allergic reaction to doxorubicin, other chemotherapy agents, soybeans, other medicines, foods, dyes, or preservatives -pregnant or trying to get pregnant -breast-feeding How should I use this medicine? This drug is given as an infusion into a vein. It is administered in a hospital or clinic by  a specially trained health care professional. If you have pain, swelling, burning or any unusual feeling around the site of your injection, tell your health care professional right away. Talk to your pediatrician regarding the use of this medicine in children. Special care may be needed. Overdosage: If you think you have taken too much of this medicine contact a poison control center or emergency room at once. NOTE: This medicine is only for you. Do not share this medicine with others. What if I miss a dose? It is important not to miss your dose. Call your doctor or health care professional if you are unable to keep an appointment. What may interact with this medicine? Do not take this medicine with any of the following medications: -zidovudine This medicine may also interact with the following medications: -medicines to increase blood counts like filgrastim, pegfilgrastim, sargramostim -vaccines Talk to your doctor or health care professional before taking any of these medicines: -acetaminophen -aspirin -ibuprofen -ketoprofen -naproxen This list may not describe all possible interactions. Give your health care provider a list of all the medicines, herbs, non-prescription drugs, or dietary supplements you use. Also tell them if you smoke, drink alcohol, or use illegal drugs. Some items may interact with your medicine. What should I watch for while using this medicine? Your condition will be monitored carefully while you are receiving this medicine. You will need important blood work done while you are taking this medicine. This drug may make you feel generally unwell. This is not uncommon, as chemotherapy can affect healthy cells as well as cancer cells. Report any side effects. Continue your course of treatment even though you feel ill  unless your doctor tells you to stop. Your urine may turn orange-red for a few days after your dose. This is not blood. If your urine is dark or brown, call  your doctor. In some cases, you may be given additional medicines to help with side effects. Follow all directions for their use. Call your doctor or health care professional for advice if you get a fever (100.5 degrees F or higher), chills or sore throat, or other symptoms of a cold or flu. Do not treat yourself. This drug decreases your body's ability to fight infections. Try to avoid being around people who are sick. This medicine may increase your risk to bruise or bleed. Call your doctor or health care professional if you notice any unusual bleeding. Be careful brushing and flossing your teeth or using a toothpick because you may get an infection or bleed more easily. If you have any dental work done, tell your dentist you are receiving this medicine. Avoid taking products that contain aspirin, acetaminophen, ibuprofen, naproxen, or ketoprofen unless instructed by your doctor. These medicines may hide a fever. Men and women of childbearing age should use effective birth control methods while using taking this medicine. Do not become pregnant while taking this medicine. There is a potential for serious side effects to an unborn child. Talk to your health care professional or pharmacist for more information. Do not breast-feed an infant while taking this medicine. Talk to your doctor about your risk of cancer. You may be more at risk for certain types of cancers if you take this medicine. What side effects may I notice from receiving this medicine? Side effects that you should report to your doctor or health care professional as soon as possible: -allergic reactions like skin rash, itching or hives, swelling of the face, lips, or tongue -low blood counts - this medicine may decrease the number of white blood cells, red blood cells and platelets. You may be at increased risk for infections and bleeding. -signs of hand-foot syndrome - tingling or burning, redness, flaking, swelling, small blisters, or  small sores on the palms of your hands or the soles of your feet -signs of infection - fever or chills, cough, sore throat, pain or difficulty passing urine -signs of decreased platelets or bleeding - bruising, pinpoint red spots on the skin, black, tarry stools, blood in the urine -signs of decreased red blood cells - unusually weak or tired, fainting spells, lightheadedness -back pain, chills, facial flushing, fever, headache, tightness in the chest or throat during the infusion -breathing problems -chest pain -fast, irregular heartbeat -mouth pain, redness, sores -pain, swelling, redness at site where injected -pain, tingling, numbness in the hands or feet -swelling of ankles, feet, or hands -vomiting Side effects that usually do not require medical attention (report to your doctor or health care professional if they continue or are bothersome): -diarrhea -hair loss -loss of appetite -nail discoloration or damage -nausea -red or watery eyes -red colored urine -stomach upset This list may not describe all possible side effects. Call your doctor for medical advice about side effects. You may report side effects to FDA at 1-800-FDA-1088. Where should I keep my medicine? This drug is given in a hospital or clinic and will not be stored at home. NOTE: This sheet is a summary. It may not cover all possible information. If you have questions about this medicine, talk to your doctor, pharmacist, or health care provider.    2016, Elsevier/Gold Standard. (2012-02-01 10:12:56)

## 2015-09-19 ENCOUNTER — Encounter: Payer: Self-pay | Admitting: Gynecologic Oncology

## 2015-09-19 ENCOUNTER — Telehealth: Payer: Self-pay

## 2015-09-19 NOTE — Progress Notes (Signed)
Gynecologic Oncology Multi-Disciplinary Disposition Conference Note  Date of the Conference: September 19, 2015  Patient Name: Stacy Webster  Referring Provider: Dr. Nelda Marseille Primary GYN Oncologist: Dr. Everitt Amber  Stage/Disposition:  Recurrent Stage IIB Endocervical Adenocarcinoma.  Disposition is to proceed with additional cycles of palliative chemotherapy.   This Multidisciplinary conference took place involving physicians from [facility], [facility], Radiation Oncology, Pathology, Radiology along with the Gynecologic Oncology Nurse Practitioner and RN.  Comprehensive assessment of the patient's malignancy, staging, need for surgery, chemotherapy, radiation therapy, and need for further testing were reviewed. Supportive measures, both inpatient and following discharge were also discussed. The recommended plan of care is documented. Greater than 35 minutes were spent correlating and coordinating this patient's care.

## 2015-09-19 NOTE — Telephone Encounter (Signed)
Pt states she is tired as her main complaint. She did throw up the first 2 days after chemo. Now she still is a little queasy. She alternates taking the ativan and compazine. She drank about 4 8oz glasses fluid today so far. Encouraged her to drink more. She is not real hungry, discussed eating a snack more frequently rather than sitting down to a full meal. Encouraged her to call if she does not get any better in a day or 2.

## 2015-09-19 NOTE — Telephone Encounter (Signed)
-----   Message from Arty Baumgartner, RN sent at 09/16/2015 11:28 AM EDT ----- Regarding: First time chemo.  [place] First time Doxil.  Tolerated well.  [phone]. Marko Plume

## 2015-09-20 ENCOUNTER — Telehealth: Payer: Self-pay

## 2015-09-20 NOTE — Telephone Encounter (Signed)
Called [facility] for chemotherapy F/U.  Patient is doing well.  Denies nausea, reports vomiting when eating, states she feels better today.  Denies any new side effects or symptoms.  Bowel and bladder is functioning well.  Eating and drinking well and I instructed to drink 64 oz minimum daily or at least the day before, of and after treatment.  Denies questions at this time and encouraged to call if needed.  Reviewed how to call after hours in the case of an emergency.

## 2015-09-20 NOTE — Telephone Encounter (Signed)
-----   Message from Arty Baumgartner, RN sent at 09/16/2015 11:28 AM EDT ----- Regarding: First time chemo.  [place] First time Doxil.  Tolerated well.  [phone]. Marko Plume

## 2015-09-21 ENCOUNTER — Telehealth: Payer: Self-pay | Admitting: *Deleted

## 2015-09-21 ENCOUNTER — Other Ambulatory Visit: Payer: Self-pay

## 2015-09-21 DIAGNOSIS — C539 Malignant neoplasm of cervix uteri, unspecified: Secondary | ICD-10-CM

## 2015-09-21 MED ORDER — PROCHLORPERAZINE MALEATE 10 MG PO TABS: 10.0000 mg | ORAL_TABLET | Freq: Four times a day (QID) | ORAL | Status: AC | PRN

## 2015-09-21 NOTE — Telephone Encounter (Signed)
"  I' received Doxil Friday.  I'm taking ativan and Pherngan together every six hours for nausea.  I feel tired with no relief.  The compazine I was given IV with treatment worked well so I'd like this called in to [facility] [address].  Saturday I vomited three to four times.  Sunday I vomited once.  And every night between 9:00 and 10:00 pm I vomit.  I'm drinking/sipping small amounts of water and Gatorade."  Denies [facility] offer to come in today.  "I do not feel up to coming out today.  Let me do this today and try Thursday or Friday if needed."

## 2015-09-21 NOTE — Telephone Encounter (Signed)
Ok for compazine 10 mg every 6 hrs prn nausea #30 Should take compazine >= 3 hrs away from phenergan  Fine to set up I liter NS + stat BMET from [facility] for 4-27 or 4-28  Thank you Birtha Hatler

## 2015-09-21 NOTE — Telephone Encounter (Addendum)
Told Stacy Webster that the compazine was sent to her pharmacy and noted the timing with the phenergan per [name]. Set her up for lab,flush and IVF for 09-23-15 at 0745 if needed. IVF orders under Signed and Held

## 2015-09-21 NOTE — Telephone Encounter (Signed)
Per desk RN I have scheduled appt for Friday. Desk RN to contact patietn

## 2015-09-22 ENCOUNTER — Emergency Department (HOSPITAL_COMMUNITY): Payer: BC Managed Care – PPO

## 2015-09-22 ENCOUNTER — Encounter (HOSPITAL_COMMUNITY): Payer: Self-pay

## 2015-09-22 ENCOUNTER — Inpatient Hospital Stay (HOSPITAL_COMMUNITY)
Admission: EM | Admit: 2015-09-22 | Discharge: 2015-09-24 | DRG: 754 | Disposition: A | Discharge status: Hospice | Payer: BC Managed Care – PPO | Source: Ambulatory Visit | Attending: Family Medicine | Admitting: Family Medicine

## 2015-09-22 DIAGNOSIS — Z791 Long term (current) use of non-steroidal anti-inflammatories (NSAID): Secondary | ICD-10-CM | POA: Diagnosis not present

## 2015-09-22 DIAGNOSIS — Z923 Personal history of irradiation: Secondary | ICD-10-CM

## 2015-09-22 DIAGNOSIS — Z888 Allergy status to other drugs, medicaments and biological substances status: Secondary | ICD-10-CM

## 2015-09-22 DIAGNOSIS — C772 Secondary and unspecified malignant neoplasm of intra-abdominal lymph nodes: Secondary | ICD-10-CM | POA: Diagnosis present

## 2015-09-22 DIAGNOSIS — F419 Anxiety disorder, unspecified: Secondary | ICD-10-CM | POA: Diagnosis present

## 2015-09-22 DIAGNOSIS — R1110 Vomiting, unspecified: Secondary | ICD-10-CM | POA: Diagnosis not present

## 2015-09-22 DIAGNOSIS — Z66 Do not resuscitate: Secondary | ICD-10-CM | POA: Diagnosis present

## 2015-09-22 DIAGNOSIS — Z515 Encounter for palliative care: Secondary | ICD-10-CM | POA: Insufficient documentation

## 2015-09-22 DIAGNOSIS — Z933 Colostomy status: Secondary | ICD-10-CM

## 2015-09-22 DIAGNOSIS — K219 Gastro-esophageal reflux disease without esophagitis: Secondary | ICD-10-CM | POA: Diagnosis present

## 2015-09-22 DIAGNOSIS — C530 Malignant neoplasm of endocervix: Secondary | ICD-10-CM | POA: Diagnosis present

## 2015-09-22 DIAGNOSIS — C7951 Secondary malignant neoplasm of bone: Secondary | ICD-10-CM | POA: Diagnosis present

## 2015-09-22 DIAGNOSIS — E876 Hypokalemia: Secondary | ICD-10-CM | POA: Diagnosis present

## 2015-09-22 DIAGNOSIS — Z9221 Personal history of antineoplastic chemotherapy: Secondary | ICD-10-CM | POA: Diagnosis not present

## 2015-09-22 DIAGNOSIS — J4520 Mild intermittent asthma, uncomplicated: Secondary | ICD-10-CM | POA: Diagnosis present

## 2015-09-22 DIAGNOSIS — C801 Malignant (primary) neoplasm, unspecified: Secondary | ICD-10-CM | POA: Diagnosis not present

## 2015-09-22 DIAGNOSIS — Z885 Allergy status to narcotic agent status: Secondary | ICD-10-CM | POA: Diagnosis not present

## 2015-09-22 DIAGNOSIS — E43 Unspecified severe protein-calorie malnutrition: Secondary | ICD-10-CM | POA: Diagnosis present

## 2015-09-22 DIAGNOSIS — E871 Hypo-osmolality and hyponatremia: Secondary | ICD-10-CM | POA: Diagnosis present

## 2015-09-22 DIAGNOSIS — K56609 Unspecified intestinal obstruction, unspecified as to partial versus complete obstruction: Secondary | ICD-10-CM | POA: Diagnosis present

## 2015-09-22 DIAGNOSIS — E860 Dehydration: Secondary | ICD-10-CM | POA: Diagnosis present

## 2015-09-22 DIAGNOSIS — R112 Nausea with vomiting, unspecified: Secondary | ICD-10-CM | POA: Diagnosis not present

## 2015-09-22 DIAGNOSIS — Z9109 Other allergy status, other than to drugs and biological substances: Secondary | ICD-10-CM | POA: Diagnosis not present

## 2015-09-22 DIAGNOSIS — Z2821 Immunization not carried out because of patient refusal: Secondary | ICD-10-CM | POA: Diagnosis not present

## 2015-09-22 DIAGNOSIS — Z6823 Body mass index (BMI) 23.0-23.9, adult: Secondary | ICD-10-CM | POA: Diagnosis not present

## 2015-09-22 DIAGNOSIS — E861 Hypovolemia: Secondary | ICD-10-CM | POA: Diagnosis present

## 2015-09-22 DIAGNOSIS — C539 Malignant neoplasm of cervix uteri, unspecified: Secondary | ICD-10-CM | POA: Diagnosis not present

## 2015-09-22 DIAGNOSIS — C541 Malignant neoplasm of endometrium: Secondary | ICD-10-CM | POA: Diagnosis not present

## 2015-09-22 DIAGNOSIS — Z79899 Other long term (current) drug therapy: Secondary | ICD-10-CM

## 2015-09-22 DIAGNOSIS — Z7189 Other specified counseling: Secondary | ICD-10-CM | POA: Insufficient documentation

## 2015-09-22 DIAGNOSIS — N1330 Unspecified hydronephrosis: Secondary | ICD-10-CM | POA: Diagnosis present

## 2015-09-22 DIAGNOSIS — R627 Adult failure to thrive: Secondary | ICD-10-CM | POA: Diagnosis present

## 2015-09-22 DIAGNOSIS — N179 Acute kidney failure, unspecified: Secondary | ICD-10-CM | POA: Diagnosis present

## 2015-09-22 DIAGNOSIS — R140 Abdominal distension (gaseous): Secondary | ICD-10-CM | POA: Diagnosis not present

## 2015-09-22 DIAGNOSIS — E8809 Other disorders of plasma-protein metabolism, not elsewhere classified: Secondary | ICD-10-CM | POA: Diagnosis present

## 2015-09-22 DIAGNOSIS — Z91018 Allergy to other foods: Secondary | ICD-10-CM | POA: Diagnosis not present

## 2015-09-22 DIAGNOSIS — E46 Unspecified protein-calorie malnutrition: Secondary | ICD-10-CM | POA: Diagnosis not present

## 2015-09-22 DIAGNOSIS — K565 Intestinal adhesions [bands] with obstruction (postprocedural) (postinfection): Secondary | ICD-10-CM | POA: Diagnosis present

## 2015-09-22 DIAGNOSIS — R180 Malignant ascites: Secondary | ICD-10-CM | POA: Diagnosis present

## 2015-09-22 DIAGNOSIS — K5669 Other intestinal obstruction: Secondary | ICD-10-CM

## 2015-09-22 DIAGNOSIS — N820 Vesicovaginal fistula: Secondary | ICD-10-CM | POA: Diagnosis present

## 2015-09-22 LAB — COMPREHENSIVE METABOLIC PANEL
ALBUMIN: 2.2 g/dL — AB (ref 3.5–5.0)
ALK PHOS: 64 U/L (ref 38–126)
ALT: 12 U/L — AB (ref 14–54)
AST: 16 U/L (ref 15–41)
Anion gap: 14 (ref 5–15)
BILIRUBIN TOTAL: 1 mg/dL (ref 0.3–1.2)
BUN: 35 mg/dL — AB (ref 6–20)
CALCIUM: 7.9 mg/dL — AB (ref 8.9–10.3)
CO2: 24 mmol/L (ref 22–32)
CREATININE: 1.4 mg/dL — AB (ref 0.44–1.00)
Chloride: 91 mmol/L — ABNORMAL LOW (ref 101–111)
GFR calc Af Amer: 48 mL/min — ABNORMAL LOW (ref >60)
GFR calc non Af Amer: 42 mL/min — ABNORMAL LOW (ref >60)
GLUCOSE: 122 mg/dL — AB (ref 65–99)
Potassium: 3.6 mmol/L (ref 3.5–5.1)
SODIUM: 129 mmol/L — AB (ref 135–145)
TOTAL PROTEIN: 5.9 g/dL — AB (ref 6.5–8.1)

## 2015-09-22 LAB — CBC WITH DIFFERENTIAL/PLATELET
BASOS PCT: 0 %
Basophils Absolute: 0 10*3/uL (ref 0.0–0.1)
EOS ABS: 0 10*3/uL (ref 0.0–0.7)
Eosinophils Relative: 0 %
HCT: 30.8 % — ABNORMAL LOW (ref 36.0–46.0)
Hemoglobin: 10.1 g/dL — ABNORMAL LOW (ref 12.0–15.0)
LYMPHS ABS: 0.4 10*3/uL — AB (ref 0.7–4.0)
Lymphocytes Relative: 3 %
MCH: 29.3 pg (ref 26.0–34.0)
MCHC: 32.8 g/dL (ref 30.0–36.0)
MCV: 89.3 fL (ref 78.0–100.0)
MONO ABS: 0.5 10*3/uL (ref 0.1–1.0)
MONOS PCT: 4 %
Neutro Abs: 12.7 10*3/uL — ABNORMAL HIGH (ref 1.7–7.7)
Neutrophils Relative %: 93 %
Platelets: 458 10*3/uL — ABNORMAL HIGH (ref 150–400)
RBC: 3.45 MIL/uL — ABNORMAL LOW (ref 3.87–5.11)
RDW: 18.4 % — AB (ref 11.5–15.5)
WBC: 13.7 10*3/uL — ABNORMAL HIGH (ref 4.0–10.5)

## 2015-09-22 LAB — LIPASE, BLOOD: Lipase: 12 U/L (ref 11–51)

## 2015-09-22 MED ORDER — SODIUM CHLORIDE 0.9 % IV SOLN
INTRAVENOUS | Status: DC
  Administered 2015-09-22: 20:00:00 via INTRAVENOUS

## 2015-09-22 MED ORDER — SODIUM CHLORIDE 0.9 % IV SOLN: INTRAVENOUS | Status: DC

## 2015-09-22 MED ORDER — ENOXAPARIN SODIUM 40 MG/0.4ML ~~LOC~~ SOLN
40.0000 mg | SUBCUTANEOUS | Status: DC
  Administered 2015-09-23: 40 mg via SUBCUTANEOUS
  Filled 2015-09-22 (×2): qty 0.4

## 2015-09-22 MED ORDER — SODIUM CHLORIDE 0.9 % IV BOLUS (SEPSIS)
1000.0000 mL | Freq: Once | INTRAVENOUS | Status: AC
  Administered 2015-09-22: 1000 mL via INTRAVENOUS

## 2015-09-22 MED ORDER — SODIUM CHLORIDE 0.9 % IV SOLN
INTRAVENOUS | Status: DC
  Administered 2015-09-22: 14:00:00 via INTRAVENOUS

## 2015-09-22 MED ORDER — HYDROMORPHONE HCL 1 MG/ML IJ SOLN: 1.0000 mg | INTRAMUSCULAR | Status: DC | PRN

## 2015-09-22 MED ORDER — PROCHLORPERAZINE EDISYLATE 5 MG/ML IJ SOLN
10.0000 mg | Freq: Four times a day (QID) | INTRAMUSCULAR | Status: DC | PRN
  Administered 2015-09-22 – 2015-09-23 (×2): 10 mg via INTRAVENOUS
  Filled 2015-09-22 (×2): qty 2

## 2015-09-22 NOTE — ED Provider Notes (Signed)
CSN: [id number]     Arrival date & time 09/22/15  0849 History   First MD Initiated Contact with Patient 09/22/15 0900     Chief Complaint  Patient presents with  . Emesis  . Fatigue     (Consider location/radiation/quality/duration/timing/severity/associated sxs/prior Treatment) HPI  55 year old female with a history of stage IV metastatic cancer presents with weakness and inability to keep food down. Has been vomiting. 5 days ago she had her first dose of chemotherapy and her most recent round of chemotherapy. Since then has been having vomiting anytime she puts food in her mouth. Is able to keep down some water. Constantly feels nauseated. Patient was given Compazine during the chemotherapy. She has been on Phenergan and sublingual Ativan without relief. Was prescribed Compazine and took one last night and one this morning. Nauseous somewhat better but not resolved. No diarrhea. Is having liquid stool but no hard stool out of her colostomy. She states she gets upper abdominal pain whenever she stands up but at rest has no pain. No fevers. Chronic urinary incontinence.  Past Medical History  Diagnosis Date  . Hydronephrosis, right   . Ureteral stricture, right   . History of septic shock     05/ 2016  due to Peptostriptococcus/  right hydronephrosis and obstruction----  resolved  . 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 Mild intermittent asthma   . History of Clostridium difficile     10-15-2014  resolved  . Left rotator cuff tear arthropathy   . White coat syndrome without hypertension   . GERD (gastroesophageal reflux disease)   . Frequency of urination   . Anemia associated with chemotherapy     Antineoplastic chemo  . Recurrent cervical cancer [facility]) oncologist-  dr Rossi/ dr Marko Plume    dx 12/ 2015---  Radiation and Chemo (07-06-2014 to 09-03-2014)  now recurrent ,  Stage IIB  w/ Mets to pelvic/ retroperitoneum---  CHEMOTHERAPY CURRENTLY  . 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 Cervical cancer ([place])   . Chronic constipation   . Urinary incontinence   . PONV (postoperative nausea and vomiting)     remote history -not recent  . Anxiety    Past Surgical History  Procedure Laterality Date  . Cystoscopy w/ ureteral stent placement Right 10/13/2014    Procedure: CYSTOSCOPY WITH RETROGRADE PYELOGRAM/URETERAL STENT PLACEMENT;  Surgeon: Festus Aloe, MD;  Location: [facility];  Service: Urology;  Laterality: Right;  . Transthoracic echocardiogram  10-13-2014    mild LVH,  ef 55-60%/  mild TR  . Tonsillectomy and adenoidectomy  age 52    age 61--  [facility]-- Adenoidectomy  . Laparoscopic cholecystectomy  1990's  . Cystoscopy with ureteroscopy and stent placement Right 02/18/2015    Procedure: CYSTO WITH RIGHT URETERAL STENT EXCHANGE, RIGHT URETEROSCOPY ;  Surgeon: Festus Aloe, MD;  Location: [facility];  Service: Urology;  Laterality: Right;  . Cystoscopy with biopsy N/A 02/18/2015    Procedure: CYSTOSCOPY WITH  BLADDER  BIOPSY;  Surgeon: Festus Aloe, MD;  Location: [facility];  Service: Urology;  Laterality: N/A;  . Cystoscopy w/ retrogrades Right 02/18/2015    Procedure: CYSTOSCOPY WITH RETROGRADE PYELOGRAM;  Surgeon: Festus Aloe, MD;  Location: [facility];  Service: Urology;  Laterality: Right;  . Dilation and curettage of uterus N/A 03/25/2015    Procedure: DILATATION AND CURETTAGE WITH ULTRASOUND;  Surgeon: Everitt Amber, MD;  Location: [facility];  Service: Gynecology;  Laterality: N/A;  . Cystoscopy/retrograde/ureteroscopy Bilateral 06/21/2015    Procedure: CYSTOSCOPY; EXAM UNDER ANESTHESIA; BLADDER BIOPSIES WITH FULGARATION; BILATERAL RETROGRADE PYLOGRAM; RIGHT URETERAL STENT EXCHANGE AND LEFT URETERAL STENT PLACEMENT;  Surgeon: Festus Aloe, MD;  Location: [facility];  Service: Urology;  Laterality: Bilateral;  . Laparotomy N/A 08/05/2015    Procedure: LAPAROSCOPY AND  DIVERTING OSTOMY;  Surgeon: Stark Klein, MD;  Location: [facility];  Service: General;  Laterality: N/A;  . Portacath placement N/A 08/30/2015    Procedure: INSERTION PORT-A-CATH;  Surgeon: Stark Klein, MD;  Location: [facility];  Service: General;  Laterality: N/A;   History reviewed. No pertinent family history. Social History  Substance Use Topics  . Smoking status: Never Smoker   . Smokeless tobacco: Never Used  . Alcohol Use: No   OB History    No data available     Review of Systems  Constitutional: Negative for fever.  Respiratory: Negative for shortness of breath.   Cardiovascular: Negative for chest pain.  Gastrointestinal: Positive for nausea, vomiting and abdominal pain. Negative for abdominal distention.  Neurological: Negative for headaches.  All other systems reviewed and are negative.     Allergies  Morphine and related; Zofran; Codeine; Hydrocodone; Peach; and Tape  Home Medications   Prior to Admission medications   Medication Sig Start Date End Date Taking? Authorizing Provider  gabapentin (NEURONTIN) 100 MG capsule Take 2 capsules (200 mg total) by mouth 2 (two) times daily. Or as directed 08/29/15  Yes Lennis P Livesay, MD  HEMOCYTE 324 (106 FE) MG TABS Take 325 mg by mouth daily. Take 1 tablet daily on empty stomach with Vitamin C tablet 11/18/14  Yes Lennis Marion Downer, MD  ibuprofen (ADVIL,MOTRIN) 200 MG tablet Take 400 mg by mouth every 6 (six) hours as needed for mild pain. Reported on 08/17/2015   Yes Historical Provider, MD  lactose free nutrition (BOOST PLUS) LIQD Take 237 mLs by mouth 3 (three) times daily with meals. 10/18/14  Yes Thurnell Lose, MD  lidocaine-prilocaine (EMLA) cream Apply to port 1-2 hours before needle access, cover with plastic wrap. 08/29/15  Yes Lennis Marion Downer, MD  LORazepam (ATIVAN) 1 MG tablet Place 1/2 - 1 tablet under the tongue or swallow evry 4-6 hrs as needed for nausea 08/29/15  Yes Lennis P Livesay, MD   pantoprazole (PROTONIX) 40 MG tablet Take 1 tablet (40 mg total) by mouth every evening. 08/11/15  Yes Lennis Marion Downer, MD  potassium chloride (K-DUR) 10 MEQ tablet Take 1 tablet (10 mEq total) by mouth daily. 08/11/15  Yes Lennis Marion Downer, MD  prochlorperazine (COMPAZINE) 10 MG tablet Take 1 tablet (10 mg total) by mouth every 6 (six) hours as needed for nausea or vomiting. Take compazine 3 hrs or more away from the phenergan 09/21/15  Yes Lennis Marion Downer, MD  promethazine (PHENERGAN) 25 MG tablet Take 1 tablet (25 mg total) by mouth every 6 (six) hours as needed for nausea or vomiting. Take 1/2 to 1 tablet by mouth every 6 hours as needed for nausea or vomiting 09/02/15  Yes Susanne Borders, NP  traMADol (ULTRAM) 50 MG tablet Take 1 tablet by mouth every 8 (eight) hours as needed for moderate pain. Reported on 09/15/2015 07/25/15  Yes Historical Provider, MD  vitamin C (ASCORBIC ACID) 500 MG tablet Take 500 mg by mouth daily.   Yes Historical Provider, MD   LMP 11/05/2011 Physical Exam  Constitutional: She is oriented to person, place, and  time. She appears well-developed and well-nourished.  HENT:  Head: Normocephalic and atraumatic.  Right Ear: External ear normal.  Left Ear: External ear normal.  Nose: Nose normal.  Eyes: Right eye exhibits no discharge. Left eye exhibits no discharge.  Cardiovascular: Regular rhythm and normal heart sounds.  Tachycardia present.   HR low 100s  Pulmonary/Chest: Effort normal and breath sounds normal.  Left chest port  Abdominal: Soft. There is no tenderness.  Colostomy bag in place with liquid stool  Neurological: She is alert and oriented to person, place, and time.  Skin: Skin is warm and dry.  Nursing note and vitals reviewed.   ED Course  Procedures (including critical care time) Labs Review Labs Reviewed  COMPREHENSIVE METABOLIC PANEL - Abnormal; Notable for the following:    Sodium 129 (*)    Chloride 91 (*)    Glucose, Bld 122 (*)    BUN  35 (*)    Creatinine, Ser 1.40 (*)    Calcium 7.9 (*)    Total Protein 5.9 (*)    Albumin 2.2 (*)    ALT 12 (*)    GFR calc non Af Amer 42 (*)    GFR calc Af Amer 48 (*)    All other components within normal limits  CBC WITH DIFFERENTIAL/PLATELET - Abnormal; Notable for the following:    WBC 13.7 (*)    RBC 3.45 (*)    Hemoglobin 10.1 (*)    HCT 30.8 (*)    RDW 18.4 (*)    Platelets 458 (*)    Neutro Abs 12.7 (*)    Lymphs Abs 0.4 (*)    All other components within normal limits  LIPASE, BLOOD  CBC  CREATININE, SERUM  COMPREHENSIVE METABOLIC PANEL  CBC    Imaging Review Ct Abdomen Pelvis Wo Contrast  09/22/2015  CLINICAL DATA:  History of endometrial cancer with ongoing chemotherapy and weakness EXAM: CT ABDOMEN AND PELVIS WITHOUT CONTRAST TECHNIQUE: Multidetector CT imaging of the abdomen and pelvis was performed following the standard protocol without IV contrast. COMPARISON:  08/01/2015 FINDINGS: Lung bases are free of acute infiltrate or sizable effusion. New moderate ascites is noted which would be consistent with peritoneal involvement of metastatic disease. The gallbladder has been surgically removed. The liver, spleen, adrenal glands and pancreas are within normal limits. Bilateral ureteral stents are seen. Some left-sided hydronephrosis is noted despite the ureteral stent. There is a considerable amount of air within the urinary bladder with bladder wall thickening. Additionally the stents appear to be somewhat posteriorly displaced with respect of the bladder wall which may represent increasing fistulization between the vaginal vault and urinary bladder. There is fecal material identified within the vagina consistent with the given clinical history of rectovaginal fistula. Uterine fibroid change is again noted. A left lower quadrant colostomy is now noted multiple dilated loops of small bowel are identified consistent with obstructive change although an actual transition zone is  not well appreciated on this examination. The distal most aspect of the small bowel is within normal limits. Just proximal to this in the right lower quadrant the are noted dilated loops of bowel. There is likely some adhesions/ underlying carcinomatosis involvement contributing to these changes. Bilateral inguinal adenopathy is again identified. Additionally some iliac chain small lymph nodes and small periaortic lymph nodes are identified. The osseous structures are within normal limits for the patient's given age. 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 Small bowel obstructive changes are noted  which appear to extend towards the distal ileum although the terminal ileum appears within normal limits. Are likely adhesions or mesenteric involvement contributing to the obstructive change. New left lower quadrant colostomy. Changes suggestive of further breakdown of the posterior wall of the bladder with the distal ends of the ureteral stents appearing to extend into the vaginal vault. Persistent inguinal lymphadenopathy. Electronically Signed   By: Inez Catalina M.D.   On: 09/22/2015 12:00   Dg Abd Acute W/chest  09/22/2015  CLINICAL DATA:  Mid abdominal pain starting 09/17/2015, vomiting, history of endometrial cancer. EXAM: DG ABDOMEN ACUTE W/ 1V CHEST COMPARISON:  08/30/2015 FINDINGS: Cardiomediastinal silhouette is unremarkable. No acute infiltrate or pleural effusion. No pulmonary edema. There is left subclavian Port-A-Cath with tip in SVC. There are gaseous distended bowel loops in mid abdomen highly suspicious for ileus or partial obstruction. Postcholecystectomy surgical clips are noted. Bilateral ureteral stent in place. No definite evidence of free abdominal air. IMPRESSION: No acute disease within chest. Bilateral ureteral stents. Gaseous distended bowel loops in mid abdomen highly suspicious for significant ileus or partial obstruction. No  definite evidence of free abdominal air. Postcholecystectomy surgical clips are noted. Electronically Signed   By: Lahoma Crocker M.D.   On: 09/22/2015 10:28   I have personally reviewed and evaluated these images and lab results as part of my medical decision-making.   EKG Interpretation None      MDM   Final diagnoses:  SBO (small bowel obstruction) ([place])    Patient with AKI and partial SBO. Also many other findings including new ascites (but no abd pain or fever to suggest SBP). Overall appears poor. I am worried this is all progression of her disease and that she is dying at this time. Vitals are stable. Given IV fluids. Will admit to hospitalist and have discussed need for palliative care consultation with patient who agrees    Sherwood Gambler, MD 09/22/15 [phone]

## 2015-09-22 NOTE — ED Notes (Signed)
[name] has called about the patient stating she is the pt's primary oncologist.  She has been updated on pt's status and is aware the hospitalist will be consulting on the pt for possible admission.  She is available to be paged if anything is needed prior to the hospitalist seeing the pt at [phone].

## 2015-09-22 NOTE — Progress Notes (Signed)
Called ED to receive report. Unable to reach RN. Will attempt again

## 2015-09-22 NOTE — ED Notes (Signed)
Attempted to give report at 1313, but the receiving RN is unavailable to take report.

## 2015-09-22 NOTE — ED Notes (Signed)
Pt has endometrial cancer.  Is undergoing chemo treatment last on Friday.  Pt has been having n/v with weakness since.  No fever.  Not able to keep anything down.

## 2015-09-22 NOTE — H&P (Signed)
History and Physical    [name] [id number] DOB: 1961-04-03 DOA: 09/22/2015  Referring MD/NP/PA:  PCP: [name], MD  Outpatient Specialists: Dr Marko Plume, medical oncology Patient coming from: Home  Chief Complaint: Nausea vomiting  HPI: Stacy Webster is a 55 y.o. 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 Status post diverging colostomy by Dr. Barry Dienes on 08/05/2015. recently started on Doxil on 09/16/2015, presenting to the emergency department with complaints of nausea and vomiting. She has had progressive weight loss associate with poor intake, however, after receiving her first dose of Doxil last Friday she developed nausea and vomiting over the weekend extending through this week. She states having minimal by mouth intake, unable to hold any by mouth down. She also reports having abdominal pain located in the epigastric region. She denies fevers, chills, hemoptysis, hematemesis. She also complains of associated with significant generalized weakness, fatigue, poor tolerance to physical exertion. She was prescribed Compazine yesterday which she feels has helped.   ED Course: Workup in the emergency department included a CT scan of abdomen and pelvis without contrast showing ascites consistent with peritoneal involvement of known endometrial carcinoma, small bowel obstruction changes, felt to be contributed by adhesions or mesenteric involvement. She was started on IV fluids.   Review of Systems: As per HPI otherwise 10 point review of systems negative.    Past Medical History  Diagnosis Date  . Hydronephrosis, right   . Ureteral stricture,  right   . History of septic shock     05/ 2016  due to Peptostriptococcus/  right hydronephrosis and obstruction----  resolved  . 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 Mild intermittent asthma   . History of Clostridium difficile     10-15-2014  resolved  . Left rotator cuff tear arthropathy   . White coat syndrome without hypertension   . GERD (gastroesophageal reflux disease)   . Frequency of urination   . Anemia associated with chemotherapy     Antineoplastic chemo  . Recurrent cervical cancer [facility]) oncologist-  dr Rossi/ dr Marko Plume    dx 12/ 2015---  Radiation and Chemo (07-06-2014 to 09-03-2014)  now recurrent ,  Stage IIB  w/ Mets to pelvic/ retroperitoneum---  CHEMOTHERAPY CURRENTLY  . 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 Cervical cancer ([place])   . Chronic constipation   . Urinary incontinence   . PONV (postoperative nausea and vomiting)     remote history -not recent  . Anxiety     Past Surgical History  Procedure Laterality Date  . Cystoscopy w/ ureteral stent placement Right 10/13/2014    Procedure: CYSTOSCOPY WITH RETROGRADE PYELOGRAM/URETERAL STENT PLACEMENT;  Surgeon: Festus Aloe, MD;  Location: [facility];  Service: Urology;  Laterality: Right;  . Transthoracic echocardiogram  10-13-2014    mild LVH,  ef 55-60%/  mild TR  . Tonsillectomy and adenoidectomy  age 70    age 23--  redo-- Adenoidectomy  . Laparoscopic cholecystectomy  1990's  . Cystoscopy with ureteroscopy and stent placement Right 02/18/2015    Procedure: CYSTO WITH RIGHT URETERAL STENT EXCHANGE, RIGHT URETEROSCOPY ;  Surgeon: Festus Aloe, MD;  Location: [facility];  Service: Urology;  Laterality: Right;  . Cystoscopy with biopsy N/A 02/18/2015  Procedure: CYSTOSCOPY WITH  BLADDER  BIOPSY;  Surgeon: Festus Aloe, MD;  Location: [facility];  Service: Urology;  Laterality: N/A;  . Cystoscopy w/ retrogrades Right 02/18/2015    Procedure:  CYSTOSCOPY WITH RETROGRADE PYELOGRAM;  Surgeon: Festus Aloe, MD;  Location: [facility];  Service: Urology;  Laterality: Right;  . Dilation and curettage of uterus N/A 03/25/2015    Procedure: DILATATION AND CURETTAGE WITH ULTRASOUND;  Surgeon: Everitt Amber, MD;  Location: [facility];  Service: Gynecology;  Laterality: N/A;  . Cystoscopy/retrograde/ureteroscopy Bilateral 06/21/2015    Procedure: CYSTOSCOPY; EXAM UNDER ANESTHESIA; BLADDER BIOPSIES WITH FULGARATION; BILATERAL RETROGRADE PYLOGRAM; RIGHT URETERAL STENT EXCHANGE AND LEFT URETERAL STENT PLACEMENT;  Surgeon: Festus Aloe, MD;  Location: [facility];  Service: Urology;  Laterality: Bilateral;  . Laparotomy N/A 08/05/2015    Procedure: LAPAROSCOPY AND DIVERTING OSTOMY;  Surgeon: Stark Klein, MD;  Location: [facility];  Service: General;  Laterality: N/A;  . Portacath placement N/A 08/30/2015    Procedure: INSERTION PORT-A-CATH;  Surgeon: Stark Klein, MD;  Location: [place];  Service: General;  Laterality: N/A;     reports that she has never smoked. She has never used smokeless tobacco. She reports that she does not drink alcohol or use illicit drugs.  Allergies  Allergen Reactions  . Morphine And Related Rash    Per pt, morphine also makes her vomit  . Zofran [Ondansetron Hcl] Rash    Veins with erythema after zofran infused.  . Codeine Nausea Only  . Hydrocodone Nausea And Vomiting  . Peach [Prunus Persica] Swelling and Rash  . Tape Rash    History reviewed. No pertinent family history.  Family history reviewed and not pertinent   Prior to Admission medications   Medication Sig Start Date End Date Taking? Authorizing Provider  gabapentin (NEURONTIN) 100 MG capsule Take 2 capsules (200 mg total) by mouth 2 (two) times daily. Or as directed 08/29/15  Yes Lennis P Livesay, MD  HEMOCYTE 324 (106 FE) MG TABS Take 325 mg by mouth daily. Take 1 tablet daily on empty stomach with Vitamin C  tablet 11/18/14  Yes Lennis Marion Downer, MD  ibuprofen (ADVIL,MOTRIN) 200 MG tablet Take 400 mg by mouth every 6 (six) hours as needed for mild pain. Reported on 08/17/2015   Yes Historical Provider, MD  lactose free nutrition (BOOST PLUS) LIQD Take 237 mLs by mouth 3 (three) times daily with meals. 10/18/14  Yes Thurnell Lose, MD  lidocaine-prilocaine (EMLA) cream Apply to port 1-2 hours before needle access, cover with plastic wrap. 08/29/15  Yes Lennis Marion Downer, MD  LORazepam (ATIVAN) 1 MG tablet Place 1/2 - 1 tablet under the tongue or swallow evry 4-6 hrs as needed for nausea 08/29/15  Yes Lennis P Livesay, MD  pantoprazole (PROTONIX) 40 MG tablet Take 1 tablet (40 mg total) by mouth every evening. 08/11/15  Yes Lennis Marion Downer, MD  potassium chloride (K-DUR) 10 MEQ tablet Take 1 tablet (10 mEq total) by mouth daily. 08/11/15  Yes Lennis Marion Downer, MD  prochlorperazine (COMPAZINE) 10 MG tablet Take 1 tablet (10 mg total) by mouth every 6 (six) hours as needed for nausea or vomiting. Take compazine 3 hrs or more away from the phenergan 09/21/15  Yes Lennis Marion Downer, MD  promethazine (PHENERGAN) 25 MG tablet Take 1 tablet (25 mg total) by mouth every 6 (six) hours as needed for nausea or vomiting. Take 1/2 to 1 tablet by mouth every 6  hours as needed for nausea or vomiting 09/02/15  Yes Susanne Borders, NP  traMADol (ULTRAM) 50 MG tablet Take 1 tablet by mouth every 8 (eight) hours as needed for moderate pain. Reported on 09/15/2015 07/25/15  Yes Historical Provider, MD  vitamin C (ASCORBIC ACID) 500 MG tablet Take 500 mg by mouth daily.   Yes Historical Provider, MD    Physical Exam: Filed Vitals:   09/22/15 1152 09/22/15 1308 09/22/15 1334  BP: 119/75  117/82  Pulse: 95 92 94  Resp: 29 15 16   SpO2: 98% 98% 100%      Constitutional: She is cachectic, chronically ill-appearing, in no acute distress Filed Vitals:   09/22/15 1152 09/22/15 1308 09/22/15 1334  BP: 119/75  117/82  Pulse: 95 92 94    Resp: 29 15 16   SpO2: 98% 98% 100%   Eyes: PERRL, lids and conjunctivae normal ENMT: Mucous membranes are moist. Posterior pharynx clear of any exudate or lesions.Normal dentition. Dry oral mucosa  Neck: normal, supple, no masses, no thyromegaly Respiratory: clear to auscultation bilaterally, no wheezing, no crackles. Normal respiratory effort. No accessory muscle use.  Cardiovascular: Regular rate and rhythm, no murmurs / rubs / gallops. No extremity edema. 2+ pedal pulses. No carotid bruits.  Abdomen: Her abdomen is distended no tenderness, with generalized pain to palpation, appears worse over epigastric region. Status post colostomy.  Musculoskeletal: no clubbing / cyanosis. No joint deformity upper and lower extremities. Good ROM, no contractures. Normal muscle tone.  Skin: no rashes, lesions, ulcers. No induration Neurologic: CN 2-12 grossly intact. Sensation intact, DTR normal. Strength 5/5 in all 4.  Psychiatric: Normal judgment and insight. Alert and oriented x 3. Normal mood.   Labs on Admission: I have personally reviewed following labs and imaging studies  CBC:  Recent Labs Lab 09/22/15 0956  WBC 13.7*  NEUTROABS 12.7*  HGB 10.1*  HCT 30.8*  MCV 89.3  PLT [id number]*   Basic Metabolic Panel:  Recent Labs Lab 09/22/15 0956  NA 129*  K 3.6  CL 91*  CO2 24  GLUCOSE 122*  BUN 35*  CREATININE 1.40*  CALCIUM 7.9*   GFR: Estimated Creatinine Clearance: 39.7 mL/min (by C-G formula based on Cr of 1.4). Liver Function Tests:  Recent Labs Lab 09/22/15 0956  AST 16  ALT 12*  ALKPHOS 64  BILITOT 1.0  PROT 5.9*  ALBUMIN 2.2*    Recent Labs Lab 09/22/15 0956  LIPASE 12   No results for input(s): AMMONIA in the last 168 hours. Coagulation Profile: No results for input(s): INR, PROTIME in the last 168 hours. Cardiac Enzymes: No results for input(s): CKTOTAL, CKMB, CKMBINDEX, TROPONINI in the last 168 hours. BNP (last 3 results) No results for input(s): PROBNP  in the last 8760 hours. HbA1C: No results for input(s): HGBA1C in the last 72 hours. CBG: No results for input(s): GLUCAP in the last 168 hours. Lipid Profile: No results for input(s): CHOL, HDL, LDLCALC, TRIG, CHOLHDL, LDLDIRECT in the last 72 hours. Thyroid Function Tests: No results for input(s): TSH, T4TOTAL, FREET4, T3FREE, THYROIDAB in the last 72 hours. Anemia Panel: No results for input(s): VITAMINB12, FOLATE, FERRITIN, TIBC, IRON, RETICCTPCT in the last 72 hours. Urine analysis:    Component Value Date/Time   COLORURINE YELLOW 08/02/2015 1107   APPEARANCEUR TURBID* 08/02/2015 1107   LABSPEC 1.019 08/02/2015 1107   LABSPEC 1.030 04/18/2015 1145   PHURINE 7.5 08/02/2015 1107   PHURINE 6.0 04/18/2015 1145   GLUCOSEU NEGATIVE 08/02/2015 1107  GLUCOSEU Negative 04/18/2015 1145   HGBUR LARGE* 08/02/2015 1107   HGBUR Large 04/18/2015 1145   BILIRUBINUR NEGATIVE 08/02/2015 1107   BILIRUBINUR Negative 04/18/2015 1145   KETONESUR NEGATIVE 08/02/2015 1107   KETONESUR Negative 04/18/2015 1145   PROTEINUR >300* 08/02/2015 1107   PROTEINUR 100 04/18/2015 1145   UROBILINOGEN 0.2 04/18/2015 1145   UROBILINOGEN 4.0* 03/23/2015 1102   NITRITE NEGATIVE 08/02/2015 1107   NITRITE Negative 04/18/2015 1145   LEUKOCYTESUR LARGE* 08/02/2015 1107   LEUKOCYTESUR Negative 04/18/2015 1145   Sepsis Labs: @LABRCNTIP (procalcitonin:4,lacticidven:4) )No results found for this or any previous visit (from the past 240 hour(s)).   Radiological Exams on Admission: Ct Abdomen Pelvis Wo Contrast  09/22/2015  CLINICAL DATA:  History of endometrial cancer with ongoing chemotherapy and weakness EXAM: CT ABDOMEN AND PELVIS WITHOUT CONTRAST TECHNIQUE: Multidetector CT imaging of the abdomen and pelvis was performed following the standard protocol without IV contrast. COMPARISON:  08/01/2015 FINDINGS: Lung bases are free of acute infiltrate or sizable effusion. New moderate ascites is noted which would be  consistent with peritoneal involvement of metastatic disease. The gallbladder has been surgically removed. The liver, spleen, adrenal glands and pancreas are within normal limits. Bilateral ureteral stents are seen. Some left-sided hydronephrosis is noted despite the ureteral stent. There is a considerable amount of air within the urinary bladder with bladder wall thickening. Additionally the stents appear to be somewhat posteriorly displaced with respect of the bladder wall which may represent increasing fistulization between the vaginal vault and urinary bladder. There is fecal material identified within the vagina consistent with the given clinical history of rectovaginal fistula. Uterine fibroid change is again noted. A left lower quadrant colostomy is now noted multiple dilated loops of small bowel are identified consistent with obstructive change although an actual transition zone is not well appreciated on this examination. The distal most aspect of the small bowel is within normal limits. Just proximal to this in the right lower quadrant the are noted dilated loops of bowel. There is likely some adhesions/ underlying carcinomatosis involvement contributing to these changes. Bilateral inguinal adenopathy is again identified. Additionally some iliac chain small lymph nodes and small periaortic lymph nodes are identified. The osseous structures are within normal limits for the patient's given age. 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 Small bowel obstructive changes are noted which appear to extend towards the distal ileum although the terminal ileum appears within normal limits. Are likely adhesions or mesenteric involvement contributing to the obstructive change. New left lower quadrant colostomy. Changes suggestive of further breakdown of the posterior wall of the bladder with the distal ends of the ureteral stents appearing to extend into the  vaginal vault. Persistent inguinal lymphadenopathy. Electronically Signed   By: Inez Catalina M.D.   On: 09/22/2015 12:00   Dg Abd Acute W/chest  09/22/2015  CLINICAL DATA:  Mid abdominal pain starting 09/17/2015, vomiting, history of endometrial cancer. EXAM: DG ABDOMEN ACUTE W/ 1V CHEST COMPARISON:  08/30/2015 FINDINGS: Cardiomediastinal silhouette is unremarkable. No acute infiltrate or pleural effusion. No pulmonary edema. There is left subclavian Port-A-Cath with tip in SVC. There are gaseous distended bowel loops in mid abdomen highly suspicious for ileus or partial obstruction. Postcholecystectomy surgical clips are noted. Bilateral ureteral stent in place. No definite evidence of free abdominal air. IMPRESSION: No acute disease within chest. Bilateral ureteral stents. Gaseous distended bowel loops in mid abdomen highly suspicious for significant ileus or partial obstruction. No definite evidence of free  abdominal air. Postcholecystectomy surgical clips are noted. Electronically Signed   By: Lahoma Crocker M.D.   On: 09/22/2015 10:28    EKG: Independently reviewed.   Assessment/Plan Active Problems:   Cervical cancer (HCC)   Dehydration   Hypoalbuminemia due to protein-calorie malnutrition (HCC)   Vesicovaginal fistula   Hyponatremia   Hypokalemia   Nausea with vomiting   Acute kidney injury ([place])   SBO (small bowel obstruction) ([place])   Endometrial carcinoma ([place])   1.  Nausea/vomiting. Mrs. Harmening is an unfortunate 66 old female with a history of metastatic adenocarcinoma of the cervix versus endometrium, undergoing her first infusion with Doxil on 09/16/2015 after which she developed multiple episodes of nausea vomiting and inability to hold by mouth down. CT scan of abdomen and pelvis performed in the emergency department reported by radiology to have changes consistent with small bowel obstruction. Cause of her nausea/vomiting could be related to chemotherapy given timing of initiation of  symptoms, however, SBO is also a consideration. Given terminal stage of disease would not be candidate for further surgical interventions. This was confirmed with her oncologist [name]. Will keep her NPO, provide IV fluid resuscitation, as needed IV antiemetic therapy, obtain abdominal x-ray in a.m.   2.  History of metastatic and no carcinoma of cervix versus endometrium. Unfortunately she has progressive disease, with biopsy on 08/03/2015 showing new involvement of inguinal lymph nodes. Clinical course complicated by development of vesicovaginal and a colovaginal fistulas. Status post eye burning colostomy performed by Dr. Barry Dienes on 08/05/2015. She is currently receiving her care at the cancer center, undergoing first infusion with Doxil on 09/16/2015. She verbalized her interest to me in speaking with palliative care. Palliative care consultation placed. I spoke with her medical oncologist Dr Marko Plume who agreed with palliative care consultation and felt a transition to hospice would be reasonable at this point.  3.  Small bowel obstruction. CT scan of abdomen and pelvis performed in the emergency department showing findings suggestive of small bowel obstruction involving distal ileum, although nausea vomiting occurred the day after first infusion with Doxil. We'll keep her nothing by mouth, provide IV fluids, as needed IV antiemetic therapy, obtain KUB in AM. Palliative care consultation placed for discussion of goals of care regarding advanced malignancy. As mentioned above she would not be candidate for further surgical interventions.  4.  Hyponatremia. Initial lab work showing sodium level 129. Likely related to dehydration resulting from GI losses. Provide IV fluid resuscitation with normal saline  5.  Acute kidney injury. Presenting lab work showing creatinine of 1.4, previously 0.9 on 09/15/2015. Likely related to hypovolemia/dehydration. Providing IV fluid resuscitation. Will discontinue  ibuprofen.   6.  Vesicovaginal/colovaginal fistulas. Resulting as a complication of metastatic adenocarcinoma. CT scan performed today showing further breakdown of the posterior wall of the bladder with distal ends of ureteral stents extending into the vaginal vault.   7.  Goals of care. We discussed her advanced disease and understands her limited options, reviewed CODE STATUS as she is a DO NOT RESUSCITATE. She would like to speak with palliative care.    DVT prophylaxis: Lovenox Code Status: DO NOT RESUSCITATE Family Communication: I spoke to her sister-in-law who was present at bedside Disposition Plan: admit to the inpatient service Consults called: Dr Marko Plume of medical oncology Admission status: Will admit to the inpatient service, anticipate she'll require greater than 2 nights hospitalization   Kelvin Cellar MD [facility] Pager [phone]  If 7PM-7AM, please contact night-coverage  [email or web address] Password [email or web address]  09/22/2015, 1:51 PM

## 2015-09-23 ENCOUNTER — Inpatient Hospital Stay (HOSPITAL_COMMUNITY): Payer: BC Managed Care – PPO

## 2015-09-23 ENCOUNTER — Ambulatory Visit: Payer: BC Managed Care – PPO

## 2015-09-23 DIAGNOSIS — C539 Malignant neoplasm of cervix uteri, unspecified: Secondary | ICD-10-CM | POA: Insufficient documentation

## 2015-09-23 DIAGNOSIS — Z515 Encounter for palliative care: Secondary | ICD-10-CM | POA: Insufficient documentation

## 2015-09-23 DIAGNOSIS — R140 Abdominal distension (gaseous): Secondary | ICD-10-CM

## 2015-09-23 DIAGNOSIS — N179 Acute kidney failure, unspecified: Secondary | ICD-10-CM

## 2015-09-23 DIAGNOSIS — E860 Dehydration: Secondary | ICD-10-CM

## 2015-09-23 DIAGNOSIS — C801 Malignant (primary) neoplasm, unspecified: Secondary | ICD-10-CM

## 2015-09-23 DIAGNOSIS — N828 Other female genital tract fistulae: Secondary | ICD-10-CM

## 2015-09-23 DIAGNOSIS — Z7189 Other specified counseling: Secondary | ICD-10-CM | POA: Insufficient documentation

## 2015-09-23 DIAGNOSIS — R1110 Vomiting, unspecified: Secondary | ICD-10-CM

## 2015-09-23 LAB — CBC
HEMATOCRIT: 28.9 % — AB (ref 36.0–46.0)
Hemoglobin: 9.4 g/dL — ABNORMAL LOW (ref 12.0–15.0)
MCH: 29 pg (ref 26.0–34.0)
MCHC: 32.5 g/dL (ref 30.0–36.0)
MCV: 89.2 fL (ref 78.0–100.0)
PLATELETS: 450 10*3/uL — AB (ref 150–400)
RBC: 3.24 MIL/uL — ABNORMAL LOW (ref 3.87–5.11)
RDW: 18.4 % — AB (ref 11.5–15.5)
WBC: 13.1 10*3/uL — ABNORMAL HIGH (ref 4.0–10.5)

## 2015-09-23 LAB — COMPREHENSIVE METABOLIC PANEL
ALBUMIN: 2.1 g/dL — AB (ref 3.5–5.0)
ALK PHOS: 63 U/L (ref 38–126)
ALT: 11 U/L — AB (ref 14–54)
AST: 14 U/L — AB (ref 15–41)
Anion gap: 13 (ref 5–15)
BILIRUBIN TOTAL: 0.3 mg/dL (ref 0.3–1.2)
BUN: 34 mg/dL — AB (ref 6–20)
CALCIUM: 7.7 mg/dL — AB (ref 8.9–10.3)
CO2: 23 mmol/L (ref 22–32)
CREATININE: 1.34 mg/dL — AB (ref 0.44–1.00)
Chloride: 93 mmol/L — ABNORMAL LOW (ref 101–111)
GFR calc Af Amer: 51 mL/min — ABNORMAL LOW (ref >60)
GFR, EST NON AFRICAN AMERICAN: 44 mL/min — AB (ref >60)
GLUCOSE: 107 mg/dL — AB (ref 65–99)
Potassium: 2.8 mmol/L — ABNORMAL LOW (ref 3.5–5.1)
Sodium: 129 mmol/L — ABNORMAL LOW (ref 135–145)
TOTAL PROTEIN: 5.7 g/dL — AB (ref 6.5–8.1)

## 2015-09-23 MED ORDER — TRAMADOL HCL 50 MG PO TABS: 50.0000 mg | ORAL_TABLET | Freq: Three times a day (TID) | ORAL | Status: DC | PRN

## 2015-09-23 MED ORDER — PANTOPRAZOLE SODIUM 40 MG IV SOLR
40.0000 mg | INTRAVENOUS | Status: DC
  Administered 2015-09-23: 40 mg via INTRAVENOUS
  Filled 2015-09-23: qty 40

## 2015-09-23 MED ORDER — POTASSIUM CHLORIDE CRYS ER 20 MEQ PO TBCR
40.0000 meq | EXTENDED_RELEASE_TABLET | Freq: Every day | ORAL | Status: DC
  Administered 2015-09-23: 40 meq via ORAL
  Filled 2015-09-23: qty 2
  Filled 2015-09-23: qty 4

## 2015-09-23 MED ORDER — GABAPENTIN 100 MG PO CAPS
200.0000 mg | ORAL_CAPSULE | Freq: Two times a day (BID) | ORAL | Status: DC
  Administered 2015-09-23 (×2): 200 mg via ORAL
  Filled 2015-09-23 (×2): qty 2

## 2015-09-23 MED ORDER — POTASSIUM CHLORIDE IN NACL 40-0.9 MEQ/L-% IV SOLN
INTRAVENOUS | Status: DC
  Administered 2015-09-23 – 2015-09-24 (×4): 125 mL/h via INTRAVENOUS
  Filled 2015-09-23 (×5): qty 1000

## 2015-09-23 MED ORDER — LORAZEPAM 1 MG PO TABS: 1.0000 mg | ORAL_TABLET | Freq: Four times a day (QID) | ORAL | Status: DC | PRN

## 2015-09-23 MED ORDER — PANTOPRAZOLE SODIUM 40 MG PO TBEC: 40.0000 mg | DELAYED_RELEASE_TABLET | Freq: Every evening | ORAL | Status: DC

## 2015-09-23 NOTE — Care Management Note (Signed)
Case Management Note  Patient Details  Name: Stacy Webster MRN: [id number] Date of Birth: 1960-11-19  Subjective/Objective:  Metastatic Cancer                  Action/Plan: Discharge Planning:  NCM spoke to pt and sister-in-law at bedside. Offered choice/Hospice list provided. Pt requested [facility]. [facility] Liaison with new referral. Pt states she did not need any DME for home. Husband, Stacy Webster [phone] at home to assist with care.   PCP Derrill Memo MD   Expected Discharge Date:  09/24/2015              Expected Discharge Plan:  Home/Self Care  In-House Referral:  NA  Discharge planning Services  CM Consult  Post Acute Care Choice:  Hospice Choice offered to:  Patient  DME Arranged:  N/A DME Agency:  NA  HH Arranged:  RN [place] Agency:  [facility]  Status of Service:  Completed, signed off  Medicare Important Message Given:    Date Medicare IM Given:    Medicare IM give by:    Date Additional Medicare IM Given:    Additional Medicare Important Message give by:     If discussed at [place] of Stay Meetings, dates discussed:    Additional Comments:  Erenest Rasher, RN 09/23/2015, 2:19 PM

## 2015-09-23 NOTE — Progress Notes (Signed)
[facility] Liaison Note:  Notified by [facility] of family request for [facility] [place] services at home after discharge. Chart and patient information currently under review to confirm hospice eligibility.   Made visit to patient's room, and she verbalized she was exhausted and declined wanting to speak at the moment.  She requested a visit in the morning. Left contact information in any needs arise.  [facility] will follow up in the AM.  Please call with any questions.   Thank You,  Freddi Starr RN, [facility] Liaison  [phone]

## 2015-09-23 NOTE — Progress Notes (Signed)
Consultation Note Date: 09/23/2015   Patient Name: Stacy Webster  DOB: [birth year]  MRN: [id number]  Age / Sex: 55 y.o., female  PCP: [name], MD Referring Physician: Nita Sells, MD  Reason for Consultation: Establishing goals of care and Hospice Evaluation  HPI/Patient Profile: 55 y.o. 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   Clinical Assessment and Goals of Care: I met today with the patient, her husband, her sister, and her sister-in-law.  She reports that the most important things to her or her family, her faith, feeling as well as possible as long as possible, and being out of the hospital. 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 She has 3 children including a 39 year old son.   She reports that her doctors have been doing a good job explaining things to her. She and her family just had a long conversation with [name] to discuss her current clinical situation as well as pathways moving forward. She reports there is no plan for any further disease modifying therapy.  We discussed that this does not mean that there is not care left to give, however we must focus our care on her feeling as well as possible for as long as possible in order to maximize her functional status and allow her to spend time with her family as this is the most important thing to her.  SUMMARY OF RECOMMENDATIONS   - DO NOT RESUSCITATE/DO NOT INTUBATE - She would like to pursue placement home with home hospice support. She is familiar with hospice and palliative  care of [facility] and would like to enroll with their services prior to discharge. - Placed referral to care management in order to facilitate this.  [facility] liaison is aware of patient and referral.  Code Status/Advance Care Planning:  DNR   Symptom Management:   Currently reports symptoms well managed. Will continue same.  Palliative Prophylaxis:   Aspiration, Bowel Regimen and Frequent Pain Assessment  Psycho-social/Spiritual:   Desire for further Chaplaincy support: Did not address today  Additional Recommendations: Education on Hospice  Prognosis:   < 6 months if her disease progresses along its expected course and she would qualify for hospice support  Discharge Planning: Home with Hospice      Primary Diagnoses: Present on Admission:  . Acute kidney injury ([place]) . SBO (small bowel obstruction) ([place]) . 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 Cervical cancer ([place]) . Dehydration . Hypoalbuminemia due to protein-calorie malnutrition ([place]) . Vesicovaginal fistula . Nausea with vomiting . Hypokalemia . Hyponatremia  I have reviewed the medical record, interviewed the patient and family, and examined the patient. The following aspects are pertinent.  Past Medical History  Diagnosis Date  . Hydronephrosis, right   . Ureteral  stricture, right   . History of septic shock     05/ 2016  due to Peptostriptococcus/  right hydronephrosis and obstruction----  resolved  . 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 Mild intermittent asthma   . History of Clostridium difficile     10-15-2014  resolved  . Left rotator cuff tear arthropathy   . White coat syndrome without hypertension   . GERD (gastroesophageal reflux disease)   . Frequency of urination   . Anemia associated with chemotherapy     Antineoplastic chemo  . Recurrent cervical cancer [facility]) oncologist-  dr Rossi/ dr Marko Plume    dx 12/ 2015---  Radiation and Chemo (07-06-2014 to 09-03-2014)  now  recurrent ,  Stage IIB  w/ Mets to pelvic/ retroperitoneum---  CHEMOTHERAPY CURRENTLY  . 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 Cervical cancer ([place])   . Chronic constipation   . Urinary incontinence   . PONV (postoperative nausea and vomiting)     remote history -not recent  . Anxiety    Social History   Social History  . Marital Status: Married    Spouse Name: N/A  . Number of Children: N/A  . Years of Education: N/A   Social History Main Topics  . Smoking status: Never Smoker   . Smokeless tobacco: Never Used  . Alcohol Use: No  . Drug Use: No  . Sexual Activity: Not Asked   Other Topics Concern  . None   Social History Narrative   History reviewed. No pertinent family history. Scheduled Meds: . enoxaparin (LOVENOX) injection  40 mg Subcutaneous Q24H  . gabapentin  200 mg Oral BID  . pantoprazole (PROTONIX) IV  40 mg Intravenous Q24H  . potassium chloride SA  40 mEq Oral Daily   Continuous Infusions: . 0.9 % NaCl with KCl 40 mEq / L 125 mL/hr (09/23/15 1052)   PRN Meds:.HYDROmorphone (DILAUDID) injection, LORazepam, prochlorperazine, traMADol Medications Prior to Admission:  Prior to Admission medications   Medication Sig Start Date End Date Taking? Authorizing Provider  gabapentin (NEURONTIN) 100 MG capsule Take 2 capsules (200 mg total) by mouth 2 (two) times daily. Or as directed 08/29/15  Yes Lennis P Livesay, MD  HEMOCYTE 324 (106 FE) MG TABS Take 325 mg by mouth daily. Take 1 tablet daily on empty stomach with Vitamin C tablet 11/18/14  Yes Lennis Marion Downer, MD  ibuprofen (ADVIL,MOTRIN) 200 MG tablet Take 400 mg by mouth every 6 (six) hours as needed for mild pain. Reported on 08/17/2015   Yes Historical Provider, MD  lactose free nutrition (BOOST PLUS) LIQD Take 237 mLs by mouth 3 (three) times daily with meals. 10/18/14  Yes Thurnell Lose, MD  lidocaine-prilocaine (EMLA) cream Apply to port 1-2 hours before needle access, cover with plastic wrap. 08/29/15  Yes  Lennis Marion Downer, MD  LORazepam (ATIVAN) 1 MG tablet Place 1/2 - 1 tablet under the tongue or swallow evry 4-6 hrs as needed for nausea 08/29/15  Yes Lennis P Livesay, MD  pantoprazole (PROTONIX) 40 MG tablet Take 1 tablet (40 mg total) by mouth every evening. 08/11/15  Yes Lennis Marion Downer, MD  potassium chloride (K-DUR) 10 MEQ tablet Take 1 tablet (10 mEq total) by mouth daily. 08/11/15  Yes Lennis Marion Downer, MD  prochlorperazine (COMPAZINE) 10 MG tablet Take 1 tablet (10 mg total) by mouth every 6 (six) hours as needed for nausea or vomiting. Take compazine 3 hrs or more away  from the phenergan 09/21/15  Yes Lennis Marion Downer, MD  traMADol (ULTRAM) 50 MG tablet Take 1 tablet by mouth every 8 (eight) hours as needed for moderate pain. Reported on 09/15/2015 07/25/15  Yes Historical Provider, MD  vitamin C (ASCORBIC ACID) 500 MG tablet Take 500 mg by mouth daily.   Yes Historical Provider, MD   Allergies  Allergen Reactions  . Morphine And Related Rash    Per pt, morphine also makes her vomit  . Zofran [Ondansetron Hcl] Rash    Veins with erythema after zofran infused.  . Codeine Nausea Only  . Hydrocodone Nausea And Vomiting  . Peach [Prunus Persica] Swelling and Rash  . Tape Rash   Review of Systems  Constitutional: Positive for activity change, appetite change, fatigue and unexpected weight change.  Gastrointestinal: Positive for abdominal pain and abdominal distention.     Physical Exam  General: Alert, chronically ill-appearing, in no acute distress.  HEENT: No bruits, no goiter, no JVD, mucosa dry Heart: Regular rate and rhythm. No murmur appreciated. Lungs: Good air movement, clear Abdomen:  nontender to gentle palpation, distended, positive bowel sounds. Ostomy with grayish output noted Ext: No significant edema Skin: Warm and dry Neuro: Grossly intact, nonfocal.   Vital Signs: BP 114/66 mmHg  Pulse 98  Temp(Src) 99 F (37.2 C) (Oral)  Resp 16  Ht _0  (1.626 m)  Wt 63.05  kg (139 lb)  BMI 23.85 kg/m2  SpO2 100%  LMP 11/05/2011 Pain Assessment: No/denies pain   Pain Score: 0-No pain   SpO2: SpO2: 100 % O2 Device:SpO2: 100 % O2 Flow Rate: .   IO: Intake/output summary: No intake or output data in the 24 hours ending 09/23/15 1442  LBM: Last BM Date: 09/23/15 Baseline Weight: Weight: 63.05 kg (139 lb) Most recent weight: Weight: 63.05 kg (139 lb)     Palliative Assessment/Data:   Flowsheet Rows        Most Recent Value   Intake Tab    Referral Department  Hospitalist   Unit at Time of Referral  Oncology Unit   Palliative Care Primary Diagnosis  Cancer   Date Notified  09/22/15   Palliative Care Type  New Palliative care   Date of Admission  09/22/15   # of days IP prior to Palliative referral  0   Clinical Assessment    Palliative Performance Scale Score  40%   Pain Max last 24 hours  7   Pain Min Last 24 hours  0   Psychosocial & Spiritual Assessment    Palliative Care Outcomes    Patient/Family meeting held?  Yes   Palliative Care Outcomes  Counseled regarding hospice, Transitioned to hospice, Completed durable DNR      Time In: 7026 Time Out: 1410 Time Total: 75 Greater than 50%  of this time was spent counseling and coordinating care related to the above assessment and plan.  Signed by: Micheline Rough, MD   Please contact Palliative Medicine Team phone at [phone] for questions and concerns.  For individual provider: See Shea Evans

## 2015-09-23 NOTE — Progress Notes (Signed)
MEDICAL ONCOLOGY September 23, 2015, 12:15 PM  Hospital day 2 Antibiotics: none Chemotherapy: first doxil in palliative attempt 09-16-15  Outpatient Physicians: Toy Cookey, MD, Michel Bickers, M. [place], [facility] Skinner), Marguerita Merles, Wayne Both. L.Samiah Ricklefs    Notified by ED of patient's admission 09-22-15.  EMR reviewed. Patient seen, with husband and sister present.   Discussed with Dr Verlon Au and Dr Micheline Rough on unit.  Subjective: Upper abdomen remains distended, but no vomiting since admission. Denies increased pain from ureteral stents or abdomen now. Liquid stools via ostomy, has emptied bag x 1 today. Not passing stool per vagina recently. Urinary incontinence as previously. No frank bleeding. No chills, Denies SOB in bed. PAC functioning well. Still feels thirsty.  Has tried only minimal liquids po today.   PAC by Dr Barry Dienes 08-30-15. Refused flu vaccine Ureteral stents  IV feraheme x 1 dose 08-02-15 PRBCs x 1 08-02-15  ONCOLOGIC HISTORY Patient had been in usual good health, with no previous abnormal PAP smears, when she developed frequent urination and vaginal bleeding in late 2015. She had endometrial biopsy by Dr Janyth Pupa [phone]) with adenocarcinoma. PET in [place] system 09-06-85 had hypermetabolic uptake within the uterine cervix and lower uterine segment and metastatic adenopathy within the bilateral pelvis and periaortic retroperitoneum. Dr Genia Del and Dr Marguerita Merles treated with pelvic and para-aortic radiation given with 6 cycles of sensitizing CDDP, and HDR x5 from 07-06-14 thru 09-03-14. Patient had a difficult time with nausea, vomiting, diarrhea and hypokalemia/ hypomagnesemia thru treatment. Patient used ODT zofran and ativan during chemotherapy. She had a difficult time doing oral prehydration for CDDP. She had no peripheral neuropathy with the CDDP. There was concern by completion of the treatment that she did not  have optimal response, with plan for repeat evaluation at 6 weeks from completion of the treatment. Prior to planned reevaluation, patient began having daily fevers for possibly 4 weeks, as high as 102 degrees x 2 weeks prior to 10-11-14; by presentation at ED she also had increased right flank pain. She had CT at [facility] on 10-08-14, this compared with PET of 05-2014 showed increase in subcapsular fluid collection right kidney and right hydronephrosis, increase in size of endometrial canal/ uterus/ right adnexa, and improvement in retroperitoneal, common iliac and bilateral pelvic sidewall adenopathy. She was admitted to [facility] ICU 5-16/ 10-12-14 with sepsis from peptostreptococcus. She had percutaneous drainage by IR of 635 cc dark thin fluid from right perinephric area on 10-12-14, with perinephric drain removed by urology on 10-18-14. She had right ureteral stent placement by Dr Junious Silk on 10-13-14. She developed C.difficile diarrhea, begun on flagyl 10-15-14 x 2 weeks, also on ceftin during that time. She saw Dr [name] in consultation on 11-05-14, with cervix grossly normal appearance, firm to palpation with right anterior extension, and purulent material from cervical os with manipulation, no palpable adnexal masses, induration R>L parametrium. PET 01-01-75 had hypermetabolic tissue thickening at cecum, + small right sided mesenteric and inguinal nodes, foci in myometriium and in region of cervix, small right perinephric fluid collection and dilated fluid filled endometrium. Dr [name] saw her following the PET, with recommendation for systemic treatment of the metastatic disease with CDDP, taxol and avastin. First CDDP Taxol 11-19-14, neutropenic day 14. Repeat CT showed essentially stable, minimal right perinephric fluid on 12-06-14, prior to cycle 2 CDDP taxol 7-14 with first avastin 12-10-14 and neulasta. CT AP 02-21-15 after 4 cycles CDDP taxol/ 3 cycles avastin showed  improvement. Cystoscopy, bladder biopsy with fulguration, right diagnostic ureteroscopy,right retrograde pyelogram, and right ureteral stent exchange by Dr Junious Silk on 02-18-15. Biopsies of areas of erythema and bullous edema in bladder (reportedly representative of similar changes in distal right ureter) were reviewed at [facility], felt to be urothelial atypia possibly related to previous radiation. She was hospitalized with pyometria 02-2015, then recurrent symptoms treated outpatient 03-2015. She had cycle 7 taxol CDDP avastin on 12-7 and 05-05-15. She developed symptomatic vesicovaginal fistula in 05-2015, ureteral stents not helpful and were removed. 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 First doxil 09-12-15.  Objective: Vital signs in last 24 hours: Blood pressure 121/62, pulse 95, temperature 98 F (36.7 C), temperature source Oral, resp. rate 18, height 5' 4"  (1.626 m), weight 139 lb (63.05 kg), last menstrual period 11/05/2011, SpO2 100 %. IVF at 125 cc/hr.  Awake, alert, lying in bed, respirations not labored RA. Abdomen distended. Looks pale and chronically ill but not in acute distress No alopecia. PERRL. Oral mucosa dry without lesions. PAC infusing without problems. Lungs without wheezes or rales anteriorly. Heart RRR. Upper abdomen more distended than lower, not quite tight, tympanitic, no fluid wave obvious, not tender to gentle exam. Ostomy bag half full with grayish liquid. Minimal BS. LE no pitting edema, cords, tenderness. Incontinent of urine, odor of stool and urine. Moves all extremities easily.   Lab Results:  Recent Labs  09/22/15 0956 09/23/15 0528  WBC 13.7* 13.1*  HGB 10.1* 9.4*  HCT 30.8* 28.9*  PLT 458* 450*   BMET  Recent Labs  09/22/15 0956 09/23/15 0528  NA 129* 129*  K 3.6 2.8*  CL 91* 93*  CO2 24 23  GLUCOSE 122* 107*  BUN 35* 34*  CREATININE 1.40* 1.34*  CALCIUM 7.9* 7.7*     Studies/Results: Ct Abdomen Pelvis Wo Contrast  09/22/2015  CLINICAL DATA:  History of endometrial cancer with ongoing chemotherapy and weakness EXAM: CT ABDOMEN AND PELVIS WITHOUT CONTRAST TECHNIQUE: Multidetector CT imaging of the abdomen and pelvis was performed following the standard protocol without IV contrast. COMPARISON:  08/01/2015 FINDINGS: Lung bases are free of acute infiltrate or sizable effusion. New moderate ascites is noted which would be consistent with peritoneal involvement of metastatic disease. The gallbladder has been surgically removed. The liver, spleen, adrenal glands and pancreas are within normal limits. Bilateral ureteral stents are seen. Some left-sided hydronephrosis is noted despite the ureteral stent. There is a considerable amount of air within the urinary bladder with bladder wall thickening. Additionally the stents appear to be somewhat posteriorly displaced with respect of the bladder wall which may represent increasing fistulization between the vaginal vault and urinary bladder. There is fecal material identified within the vagina consistent with the given clinical history of rectovaginal fistula. Uterine fibroid change is again noted. A left lower quadrant colostomy is now noted multiple dilated loops of small bowel are identified consistent with obstructive change although an actual transition zone is not well appreciated on this examination. The distal most aspect of the small bowel is within normal limits. Just proximal to this in the right lower quadrant the are noted dilated loops of bowel. There is likely some adhesions/ underlying carcinomatosis involvement contributing to these changes. Bilateral inguinal adenopathy is again identified. Additionally some iliac chain small lymph nodes and small periaortic lymph nodes are identified. The osseous structures are within normal limits for the patient's given age. IMPRESSION: New ascites consistent with peritoneal  involvement of the known endometrial carcinoma. Small bowel obstructive changes are noted which appear to extend towards the distal ileum although the terminal ileum appears within normal limits. Are likely adhesions or mesenteric involvement contributing to the obstructive change. New left lower quadrant colostomy. Changes suggestive of further breakdown of the posterior wall of the bladder with the distal ends of the ureteral stents appearing to extend into the vaginal vault. Persistent inguinal lymphadenopathy. Electronically Signed   By: Inez Catalina M.D.   On: 09/22/2015 12:00   Dg Abd 1 View  09/23/2015  CLINICAL DATA:  Small-bowel obstruction. EXAM: ABDOMEN - 1 VIEW COMPARISON:  09/22/2015 . FINDINGS: Surgical clips right upper quadrant. Bilateral ureteral stents are noted. Persistent small bowel distention consistent with small bowel obstruction. Small bowel diameter measures up to 5 cm. Paucity of colonic gas. No free air identified. Left lower pelvic calcifications and/or sutures again noted. Pelvic calcifications consistent phleboliths. IMPRESSION: 1. Bilateral double-J ureteral stents in good anatomic position. 2. Persistent small-bowel obstruction. Small bowel measures up to 5 cm in diameter. Electronically Signed   By: Marcello Moores  Register   On: 09/23/2015 07:14   Dg Abd Acute W/chest  09/22/2015  CLINICAL DATA:  Mid abdominal pain starting 09/17/2015, vomiting, history of endometrial cancer. EXAM: DG ABDOMEN ACUTE W/ 1V CHEST COMPARISON:  08/30/2015 FINDINGS: Cardiomediastinal silhouette is unremarkable. No acute infiltrate or pleural effusion. No pulmonary edema. There is left subclavian Port-A-Cath with tip in SVC. There are gaseous distended bowel loops in mid abdomen highly suspicious for ileus or partial obstruction. Postcholecystectomy surgical clips are noted. Bilateral ureteral stent in place. No definite evidence of free abdominal air. IMPRESSION: No acute disease within chest. Bilateral  ureteral stents. Gaseous distended bowel loops in mid abdomen highly suspicious for significant ileus or partial obstruction. No definite evidence of free abdominal air. Postcholecystectomy surgical clips are noted. Electronically Signed   By: Lahoma Crocker M.D.   On: 09/22/2015 10:28    PACS images reviewed  DISCUSSION:  More symptoms from advanced gyn carcinoma, now with at least partial SBO, also vesico vaginal and colovaginal fistulas. Doubt present symptoms are primarily from doxil, but patient agrees now that continuing chemo will not improve situation. She and sister agree that managing symptoms and her comfort are most important now.  I have recommended bowel rest with only sips of clear liquids for now. I have explained that likely liquids will be most easily tolerated by GI tract from here. I have told them that we do not routinely give IVF at home with Hospice. We discussed percutaneous nephrostomy tubes, which she is reluctant to pursue, prefers to manage urinary incontinence as presently. This is the second time that I have met husband, as he works nights and did not come with her to office appointments. He seems shocked by information that her disease is so extensive and advanced; sister is in tears but seems to understand situation.  We have discussed Hospice referral, appropriate given advanced malignancy which will no longer be treated aggressively with chemotherapy. Patient expresses desire to be at home, tho I did mention inpatient Hospice. Note she has 2 yo son at home.  Dr Domingo Cocking has kindly agreed to talk about role of Hospice and Palliative Care.   Assessment/Plan: 1. Recurrent progressive gyn adenocarcinoma: may in fact be endocervical primary, which is not really pertinent now as she has progressed thru all of best treatment available and is in terminal stages of disease. No further chemo appropriate. Refer to Hospice  when able to be DC. Appreciate Dr Domingo Cocking also assisting in  hospital. 2.at least partial SBO related to malignancy, still with liquid stool from descending colostomy. Agree with IVF, would avoid NG if possible, sips clear liquids as tolerated. 3.DNR patient's request 4.PAC in by Dr Barry Dienes 5.decreasing hemoglobin: likely reflecting dehydration on admission. Does not need routine blood draws. Has had first feraheme, ok without second infusion now 6.history of asthma 7.colovaginal and vesico vaginal fistulas   Please call between my rounds if I can assist. I am glad to be attending physician for Hospice if needed.  [place] Pager [phone]

## 2015-09-23 NOTE — Progress Notes (Signed)
PROGRESS NOTE    Stacy Webster  [id number] DOB: 1961-01-28 DOA: 09/22/2015 PCP: [name], MD  Outpatient Specialists:   Dr Marko Plume -med-onc  Brief Narrative:  55 y/o ? Recurrent Stage IIB Endocervical Adenocarcinoma -s/p chemo-XRT Vesicovag/colovag fisulae s/p diverting colostomy 08/05/15 [see PN Dr Marko Plume dated 4/20 for Oncological h/o please] Started on Pallaitive Doxil 4/24-initally toerlated but   Admit with N/v + abd pain and Adult FTT from maliganancy in setting advanced malignancy Normotensive on admit Na 129/bun + Creat 35/1.4--Prior Creat 0.9 09/15/15 CT scan sugg SBO although able to take sips  Assessment & Plan:   Active Problems:   Cervical cancer (HCC)   Dehydration   Hypoalbuminemia due to protein-calorie malnutrition (HCC)   Vesicovaginal fistula   Hyponatremia   Hypokalemia   Nausea with vomiting   Acute kidney injury ([place])   SBO (small bowel obstruction) ([place])   Endometrial carcinoma ([place])   Patient is doing better from a symptomatic standpoint. She asks me whether she has an obstruction in her stomach She is passing a lot of gas in her ostomy and has distention of her abdomen. She still passing urine and is incontinent I went over lab work as well as imaging studies and it is not clear to me that she has a clear-cut case of either partial or complete small bowel obstruction based on the CT scan findings Clinically she is able to tolerate boost I had a discussion subsequently with [name] of oncology who states that the patient has no further options and has been told almost 1 year ago that the intent of her care would be primarily palliative. Further I had a discussion with Dr. Domingo Cocking of palliative care who agrees to help facilitate discussions with regards to hospice palliative care moving forward if the patient so decides to make this transition It appears that the patient's husband is not aware of the gravity of situation and it appears  that both [name] and Dr. Domingo Cocking have had detailed discussions with the patient with regards to next steps and that the patient has elected on hospice at home Hospice choice will be offered and I anticipate if patient is reliably able to keep down some fluids at least without significant difficulty, then based on how the patient looks to more we may be able to reliably transition her to hospice at home once the goals of care fully delineated. I will adjust patient's medications on discharge from accordingly to include only essentials for comfort if this trajectory is what she wishes    DVT prophylaxis: Lovenox Code Status: DO NOT RESUSCITATE Family Communication: Extensive discussion with husband and wife in the room on 09/23/2015  disposition Plan: unclear-maybe 48 hours depending on sympotmd   Consultants:   Oncology-[facility]  [place]-[facility]  Subjective: Fair In good spirits some abd disocomfort  Objective: Filed Vitals:   09/22/15 1334 09/22/15 1354 09/22/15 2120 09/23/15 0643  BP: 117/82 115/67 117/79 121/62  Pulse: 94 90 106 95  Temp:  98.3 F (36.8 C) 98.8 F (37.1 C) 98 F (36.7 C)  TempSrc:  Oral Oral Oral  Resp: 16 16 18 18   Height:  5\' 4"  (1.626 m)    Weight:  63.05 kg (139 lb)    SpO2: 100% 100% 99% 100%   No intake or output data in the 24 hours ending 09/23/15 1030 Filed Weights   09/22/15 1354  Weight: 63.05 kg (139 lb)    Examination:  General exam: Appears calm and comfortable  Respiratory system: Clear to auscultation. Respiratory effort normal. Cardiovascular system: S1 & S2 heard, RRR. No JVD, murmurs, rubs, gallops or clicks. No pedal edema. Gastrointestinal system: Abdomen is distended, High-pitched bowel sounds, distended, tympanitic Central nervous system: Alert and oriented. No focal neurological deficits. Extremities: Symmetric 5 x 5 power. Skin: No rashes, lesions or ulcers Psychiatry: Judgement and insight appear normal. Mood &  affect appropriate.     Data Reviewed: I have personally reviewed following labs and imaging studies  CBC:  Recent Labs Lab 09/22/15 0956 09/23/15 0528  WBC 13.7* 13.1*  NEUTROABS 12.7*  --   HGB 10.1* 9.4*  HCT 30.8* 28.9*  MCV 89.3 89.2  PLT 458* [id number]*   Basic Metabolic Panel:  Recent Labs Lab 09/22/15 0956 09/23/15 0528  NA 129* 129*  K 3.6 2.8*  CL 91* 93*  CO2 24 23  GLUCOSE 122* 107*  BUN 35* 34*  CREATININE 1.40* 1.34*  CALCIUM 7.9* 7.7*   GFR: Estimated Creatinine Clearance: 41.4 mL/min (by C-G formula based on Cr of 1.34). Liver Function Tests:  Recent Labs Lab 09/22/15 0956 09/23/15 0528  AST 16 14*  ALT 12* 11*  ALKPHOS 64 63  BILITOT 1.0 0.3  PROT 5.9* 5.7*  ALBUMIN 2.2* 2.1*    Recent Labs Lab 09/22/15 0956  LIPASE 12   No results for input(s): AMMONIA in the last 168 hours. Coagulation Profile: No results for input(s): INR, PROTIME in the last 168 hours. Cardiac Enzymes: No results for input(s): CKTOTAL, CKMB, CKMBINDEX, TROPONINI in the last 168 hours. BNP (last 3 results) No results for input(s): PROBNP in the last 8760 hours. HbA1C: No results for input(s): HGBA1C in the last 72 hours. CBG: No results for input(s): GLUCAP in the last 168 hours. Lipid Profile: No results for input(s): CHOL, HDL, LDLCALC, TRIG, CHOLHDL, LDLDIRECT in the last 72 hours. Thyroid Function Tests: No results for input(s): TSH, T4TOTAL, FREET4, T3FREE, THYROIDAB in the last 72 hours. Anemia Panel: No results for input(s): VITAMINB12, FOLATE, FERRITIN, TIBC, IRON, RETICCTPCT in the last 72 hours. Urine analysis:    Component Value Date/Time   COLORURINE YELLOW 08/02/2015 1107   APPEARANCEUR TURBID* 08/02/2015 1107   LABSPEC 1.019 08/02/2015 1107   LABSPEC 1.030 04/18/2015 1145   PHURINE 7.5 08/02/2015 1107   PHURINE 6.0 04/18/2015 1145   GLUCOSEU NEGATIVE 08/02/2015 1107   GLUCOSEU Negative 04/18/2015 1145   HGBUR LARGE* 08/02/2015 1107   HGBUR  Large 04/18/2015 1145   BILIRUBINUR NEGATIVE 08/02/2015 1107   BILIRUBINUR Negative 04/18/2015 1145   KETONESUR NEGATIVE 08/02/2015 1107   KETONESUR Negative 04/18/2015 1145   PROTEINUR >300* 08/02/2015 1107   PROTEINUR 100 04/18/2015 1145   UROBILINOGEN 0.2 04/18/2015 1145   UROBILINOGEN 4.0* 03/23/2015 1102   NITRITE NEGATIVE 08/02/2015 1107   NITRITE Negative 04/18/2015 1145   LEUKOCYTESUR LARGE* 08/02/2015 1107   LEUKOCYTESUR Negative 04/18/2015 1145     Radiology Studies: Ct Abdomen Pelvis Wo Contrast  09/22/2015  CLINICAL DATA:  History of endometrial cancer with ongoing chemotherapy and weakness EXAM: CT ABDOMEN AND PELVIS WITHOUT CONTRAST TECHNIQUE: Multidetector CT imaging of the abdomen and pelvis was performed following the standard protocol without IV contrast. COMPARISON:  08/01/2015 FINDINGS: Lung bases are free of acute infiltrate or sizable effusion. New moderate ascites is noted which would be consistent with peritoneal involvement of metastatic disease. The gallbladder has been surgically removed. The liver, spleen, adrenal glands and pancreas are within normal limits. Bilateral ureteral stents are seen. Some left-sided hydronephrosis is  noted despite the ureteral stent. There is a considerable amount of air within the urinary bladder with bladder wall thickening. Additionally the stents appear to be somewhat posteriorly displaced with respect of the bladder wall which may represent increasing fistulization between the vaginal vault and urinary bladder. There is fecal material identified within the vagina consistent with the given clinical history of rectovaginal fistula. Uterine fibroid change is again noted. A left lower quadrant colostomy is now noted multiple dilated loops of small bowel are identified consistent with obstructive change although an actual transition zone is not well appreciated on this examination. The distal most aspect of the small bowel is within normal  limits. Just proximal to this in the right lower quadrant the are noted dilated loops of bowel. There is likely some adhesions/ underlying carcinomatosis involvement contributing to these changes. Bilateral inguinal adenopathy is again identified. Additionally some iliac chain small lymph nodes and small periaortic lymph nodes are identified. The osseous structures are within normal limits for the patient's given age. 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 Small bowel obstructive changes are noted which appear to extend towards the distal ileum although the terminal ileum appears within normal limits. Are likely adhesions or mesenteric involvement contributing to the obstructive change. New left lower quadrant colostomy. Changes suggestive of further breakdown of the posterior wall of the bladder with the distal ends of the ureteral stents appearing to extend into the vaginal vault. Persistent inguinal lymphadenopathy. Electronically Signed   By: Inez Catalina M.D.   On: 09/22/2015 12:00   Dg Abd 1 View  09/23/2015  CLINICAL DATA:  Small-bowel obstruction. EXAM: ABDOMEN - 1 VIEW COMPARISON:  09/22/2015 . FINDINGS: Surgical clips right upper quadrant. Bilateral ureteral stents are noted. Persistent small bowel distention consistent with small bowel obstruction. Small bowel diameter measures up to 5 cm. Paucity of colonic gas. No free air identified. Left lower pelvic calcifications and/or sutures again noted. Pelvic calcifications consistent phleboliths. IMPRESSION: 1. Bilateral double-J ureteral stents in good anatomic position. 2. Persistent small-bowel obstruction. Small bowel measures up to 5 cm in diameter. Electronically Signed   By: Marcello Moores  Register   On: 09/23/2015 07:14   Dg Abd Acute W/chest  09/22/2015  CLINICAL DATA:  Mid abdominal pain starting 09/17/2015, vomiting, history of endometrial cancer. EXAM: DG ABDOMEN ACUTE W/ 1V CHEST  COMPARISON:  08/30/2015 FINDINGS: Cardiomediastinal silhouette is unremarkable. No acute infiltrate or pleural effusion. No pulmonary edema. There is left subclavian Port-A-Cath with tip in SVC. There are gaseous distended bowel loops in mid abdomen highly suspicious for ileus or partial obstruction. Postcholecystectomy surgical clips are noted. Bilateral ureteral stent in place. No definite evidence of free abdominal air. IMPRESSION: No acute disease within chest. Bilateral ureteral stents. Gaseous distended bowel loops in mid abdomen highly suspicious for significant ileus or partial obstruction. No definite evidence of free abdominal air. Postcholecystectomy surgical clips are noted. Electronically Signed   By: Lahoma Crocker M.D.   On: 09/22/2015 10:28        Scheduled Meds: . enoxaparin (LOVENOX) injection  40 mg Subcutaneous Q24H   Continuous Infusions: . 0.9 % NaCl with KCl 40 mEq / L 125 mL/hr (09/23/15 1052)     LOS: 1 day    Time spent: King, MD [facility] ([facility]   If 7PM-7AM, please contact night-coverage [email or web address] Password [email or web address] 09/23/2015, 10:30 AM

## 2015-09-23 NOTE — Progress Notes (Signed)
Initial Nutrition Assessment  DOCUMENTATION CODES:   Severe malnutrition in context of chronic illness  INTERVENTION:  -Snacks. Will order [place] with lunch and dinner per pt request. -Continue Boost High Protein supplements provided from home. -Continue to monitor nutritional needs.   NUTRITION DIAGNOSIS:   Increased nutrient needs related to cancer and cancer related treatments as evidenced by estimated needs.  GOAL:   Patient will meet greater than or equal to 90% of their needs  MONITOR:   Other (Comment), PO intake, Supplement acceptance, Labs, Weight trends, Skin, I & O's (GOC)  REASON FOR ASSESSMENT:   Malnutrition Screening Tool    ASSESSMENT:   55 y.o. 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 Status post diverging colostomy by Dr. Barry Dienes on 08/05/2015. recently started on Doxil on 09/16/2015, presenting to the emergency department with complaints of nausea and vomiting. She has had progressive weight loss associate with poor intake, however, after receiving her first dose of Doxil last Friday she developed nausea and vomiting over the weekend extending through this week. She states having minimal by mouth intake, unable to hold any by mouth down. She also reports having abdominal pain located in the epigastric region. She denies fevers, chills, hemoptysis, hematemesis. She also complains of associated with significant generalized weakness, fatigue, poor tolerance to physical exertion. She was prescribed Compazine yesterday which she feels has helped.  Pt seen for MST. Pt BMI is normal. Per chart, pt weight has been stable, 137-140 lbs,  since 1/17. Pt reports losing 90 lbs in the last 1.5 years. This would be a 39% weight loss which is significant for time frame.   Pt reports eating once/day for the past 1.5 years d/t poor appetite. Pt has been experiencing N/V every time she ate PTA. Per chart, this may be d/t chemo therapy or bowel obstruction. Pt is on regular diet now and was able to tolerate a few bites of mashed potatoes and chicken noodle soup last night without N/V. This am, pt drank a Boost and denies N/V. Pt continues to report poor appetite but intake is able to pass through GI now. Per chart, pt has had BM. Pt has brought Boost High Protein drinks from home. Each supplement provides 240 kcals and 15 grams of protein. Pt refuses nutritional supplements from hospital because she likes a specific flavor. Intern discussed snack options with pt that may help her appetite. Pt requests [place] [employer] with meals.   Per chart, pt has progressive dz and medical oncologist feels a transition to hospice is reasonable. Pt is understanding of dz course. Aggressive nutrition interventions are not warranted at this point. Will continue to monitor for nutritional needs and GOC.  NFPE: Severe fat depletion, severe muscle depletion, no edema.  Labs reviewed: Na 129 mmol/l, K 2.8 mmol/l, Cl 93 mmol/l, BUN 34 ml/min, creatinine 1.34 mg/dl, Ca 7.7 mg/dl, GFR 44 ml/min, glucose 107 mg/dl.  Meds reviewed.   Diet Order:  Diet clear liquid Room service appropriate?: Yes; Fluid consistency:: Thin  Skin:  Reviewed, no issues  Last BM:  4/28  Height:   Ht Readings from Last 1 Encounters:  09/22/15 5\' 4"  (1.626 m)    Weight:   Wt Readings from Last 1 Encounters:  09/22/15 139 lb (63.05 kg)  Ideal Body Weight:  54.5 kg  BMI:  Body mass index is 23.85 kg/(m^2).  Estimated Nutritional Needs:   Kcal:  1800-2000 (28-31 kcals/kg)  Protein:  90-100 g (1.5 g/kg)  Fluid:  2 L  EDUCATION NEEDS:   No education needs identified at this  time  Geoffery Lyons, [place] Dietetic Intern Pager [phone]

## 2015-09-24 MED ORDER — HEPARIN SOD (PORK) LOCK FLUSH 100 UNIT/ML IV SOLN
500.0000 [IU] | INTRAVENOUS | Status: DC | PRN
  Filled 2015-09-24: qty 5

## 2015-09-24 MED ORDER — LORAZEPAM 2 MG/ML PO CONC
1.0000 mg | ORAL | Status: AC | PRN
Start: 2015-09-24 — End: ?

## 2015-09-24 NOTE — Discharge Summary (Signed)
Physician Discharge Summary  Stacy Webster [id number] DOB: 16-Apr-1961 DOA: 09/22/2015  PCP: [name], MD  Admit date: 09/22/2015 Discharge date: 09/24/2015  Time spent: 35 minutes  Recommendations for Outpatient Follow-up:   1. [facility] [place] to assume care with 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 Recurrent Stage IIB Endocervical Adenocarcinoma -s/p chemo-XRT Vesicovag/colovag fisulae s/p diverting colostomy 08/05/15 [see PN Dr Marko Plume dated 4/20 for Oncological h/o please] Started on Pallaitive Doxil 4/24-initally toerlated but   Admit with N/v + abd pain and Adult FTT from maliganancy in setting advanced malignancy Normotensive on admit Na 129/bun + Creat 35/1.4--Prior Creat 0.9 09/15/15 CT scan sugg SBO although able to take sips  She was seen by multi-disciplinary team of Oncology, Palliative care, myself and ultimately choice of hospice was offered after patient had elected on Palliatve care and comfort trajectory  She has elected to d/c home with Hospice and has excellent family support as well as a good Management consultant system [facility] to assume care for symptomatic EOL care Multiple meds non -consistent with palliative approach have been delineated   Consultations:  Palliative care  Oncology  Discharge  Exam: Filed Vitals:   09/23/15 2049 09/24/15 0508  BP: 133/71 114/65  Pulse: 100 95  Temp: 98.6 F (37 C) 98.5 F (36.9 C)  Resp: 16 16    General: cachectic, EOMI ncat Cardiovascular: s1 s 2no m/r/g Respiratory: clear no added sound  Discharge Instructions   Discharge Instructions    Diet - low sodium heart healthy    Complete by:  As directed      Increase activity slowly    Complete by:  As directed           Current Discharge Medication List    START taking these medications   Details  LORazepam (ATIVAN) 2 MG/ML concentrated solution Take 0.5 mLs (1 mg total) by mouth every 4 (four) hours as needed for anxiety. Qty: 30 mL, Refills: 0      CONTINUE these medications which have NOT CHANGED   Details  gabapentin (NEURONTIN) 100 MG capsule Take 2 capsules (200 mg total) by mouth 2 (two) times daily. Or as directed Qty: 120 capsule, Refills: 1    ibuprofen (ADVIL,MOTRIN) 200 MG tablet Take 400 mg by mouth every 6 (six) hours as needed for mild pain. Reported on 08/17/2015    lidocaine-prilocaine (EMLA) cream Apply to port 1-2 hours before needle access, cover with plastic wrap. Qty: 30 g, Refills: 0    prochlorperazine (COMPAZINE) 10 MG tablet Take 1 tablet (10 mg total) by mouth every 6 (six) hours as needed for nausea or vomiting. Take compazine 3 hrs or more away from the phenergan Qty: 30 tablet, Refills: 0   Associated Diagnoses: Malignant neoplasm of cervix, unspecified site (HCC)    traMADol (ULTRAM) 50 MG  tablet Take 1 tablet by mouth every 8 (eight) hours as needed for moderate pain. Reported on 09/15/2015 Refills: 0   Associated Diagnoses: Endometrial cancer, FIGO stage IVB ([place]); Dehydration      STOP taking these medications     HEMOCYTE 324 (106 FE) MG TABS      lactose free nutrition (BOOST PLUS) LIQD      LORazepam (ATIVAN) 1 MG tablet      pantoprazole (PROTONIX) 40 MG tablet      potassium chloride (K-DUR) 10 MEQ tablet      vitamin C  (ASCORBIC ACID) 500 MG tablet        Allergies  Allergen Reactions  . Morphine And Related Rash    Per pt, morphine also makes her vomit  . Zofran [Ondansetron Hcl] Rash    Veins with erythema after zofran infused.  . Codeine Nausea Only  . Hydrocodone Nausea And Vomiting  . Peach [Prunus Persica] Swelling and Rash  . Tape Rash      The results of significant diagnostics from this hospitalization (including imaging, microbiology, ancillary and laboratory) are listed below for reference.    Significant Diagnostic Studies: Ct Abdomen Pelvis Wo Contrast  09/22/2015  CLINICAL DATA:  History of endometrial cancer with ongoing chemotherapy and weakness EXAM: CT ABDOMEN AND PELVIS WITHOUT CONTRAST TECHNIQUE: Multidetector CT imaging of the abdomen and pelvis was performed following the standard protocol without IV contrast. COMPARISON:  08/01/2015 FINDINGS: Lung bases are free of acute infiltrate or sizable effusion. New moderate ascites is noted which would be consistent with peritoneal involvement of metastatic disease. The gallbladder has been surgically removed. The liver, spleen, adrenal glands and pancreas are within normal limits. Bilateral ureteral stents are seen. Some left-sided hydronephrosis is noted despite the ureteral stent. There is a considerable amount of air within the urinary bladder with bladder wall thickening. Additionally the stents appear to be somewhat posteriorly displaced with respect of the bladder wall which may represent increasing fistulization between the vaginal vault and urinary bladder. There is fecal material identified within the vagina consistent with the given clinical history of rectovaginal fistula. Uterine fibroid change is again noted. A left lower quadrant colostomy is now noted multiple dilated loops of small bowel are identified consistent with obstructive change although an actual transition zone is not well appreciated on this examination. The distal  most aspect of the small bowel is within normal limits. Just proximal to this in the right lower quadrant the are noted dilated loops of bowel. There is likely some adhesions/ underlying carcinomatosis involvement contributing to these changes. Bilateral inguinal adenopathy is again identified. Additionally some iliac chain small lymph nodes and small periaortic lymph nodes are identified. The osseous structures are within normal limits for the patient's given age. 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 Small bowel obstructive changes are noted which appear to extend towards the distal ileum although the terminal ileum appears within normal limits. Are likely adhesions or mesenteric involvement contributing to the obstructive change. New left lower quadrant colostomy. Changes suggestive of further breakdown of the posterior wall of the bladder with the distal ends of the ureteral stents appearing to extend into the vaginal vault. Persistent inguinal lymphadenopathy. Electronically Signed   By: Inez Catalina M.D.   On: 09/22/2015 12:00   Dg Abd 1 View  09/23/2015  CLINICAL DATA:  Small-bowel obstruction. EXAM: ABDOMEN - 1 VIEW COMPARISON:  09/22/2015 . FINDINGS: Surgical clips right upper quadrant. Bilateral ureteral  stents are noted. Persistent small bowel distention consistent with small bowel obstruction. Small bowel diameter measures up to 5 cm. Paucity of colonic gas. No free air identified. Left lower pelvic calcifications and/or sutures again noted. Pelvic calcifications consistent phleboliths. IMPRESSION: 1. Bilateral double-J ureteral stents in good anatomic position. 2. Persistent small-bowel obstruction. Small bowel measures up to 5 cm in diameter. Electronically Signed   By: Marcello Moores  Register   On: 09/23/2015 07:14   Dg Chest Port 1 View  08/30/2015  CLINICAL DATA:  Port-A-Cath placement EXAM: PORTABLE CHEST 1 VIEW COMPARISON:  08/01/15  FINDINGS: Cardiomediastinal silhouette is stable. There is left subclavian Port-A-Cath with tip in SVC. No pneumothorax. No infiltrate or pulmonary edema. IMPRESSION: No active disease.  Left subclavian Port-A-Cath in place. Electronically Signed   By: Lahoma Crocker M.D.   On: 08/30/2015 08:58   Dg Abd Acute W/chest  09/22/2015  CLINICAL DATA:  Mid abdominal pain starting 09/17/2015, vomiting, history of endometrial cancer. EXAM: DG ABDOMEN ACUTE W/ 1V CHEST COMPARISON:  08/30/2015 FINDINGS: Cardiomediastinal silhouette is unremarkable. No acute infiltrate or pleural effusion. No pulmonary edema. There is left subclavian Port-A-Cath with tip in SVC. There are gaseous distended bowel loops in mid abdomen highly suspicious for ileus or partial obstruction. Postcholecystectomy surgical clips are noted. Bilateral ureteral stent in place. No definite evidence of free abdominal air. IMPRESSION: No acute disease within chest. Bilateral ureteral stents. Gaseous distended bowel loops in mid abdomen highly suspicious for significant ileus or partial obstruction. No definite evidence of free abdominal air. Postcholecystectomy surgical clips are noted. Electronically Signed   By: Lahoma Crocker M.D.   On: 09/22/2015 10:28   Dg Fluoro Guide Cv Line-no Report  08/30/2015  CLINICAL DATA:  FLOURO GUIDE CV LINE Fluoroscopy was utilized by the requesting physician.  No radiographic interpretation.    Microbiology: No results found for this or any previous visit (from the past 240 hour(s)).   Labs: Basic Metabolic Panel:  Recent Labs Lab 09/22/15 0956 09/23/15 0528  NA 129* 129*  K 3.6 2.8*  CL 91* 93*  CO2 24 23  GLUCOSE 122* 107*  BUN 35* 34*  CREATININE 1.40* 1.34*  CALCIUM 7.9* 7.7*   Liver Function Tests:  Recent Labs Lab 09/22/15 0956 09/23/15 0528  AST 16 14*  ALT 12* 11*  ALKPHOS 64 63  BILITOT 1.0 0.3  PROT 5.9* 5.7*  ALBUMIN 2.2* 2.1*    Recent Labs Lab 09/22/15 0956  LIPASE 12   No  results for input(s): AMMONIA in the last 168 hours. CBC:  Recent Labs Lab 09/22/15 0956 09/23/15 0528  WBC 13.7* 13.1*  NEUTROABS 12.7*  --   HGB 10.1* 9.4*  HCT 30.8* 28.9*  MCV 89.3 89.2  PLT 458* 450*   Cardiac Enzymes: No results for input(s): CKTOTAL, CKMB, CKMBINDEX, TROPONINI in the last 168 hours. BNP: BNP (last 3 results) No results for input(s): BNP in the last 8760 hours.  ProBNP (last 3 results) No results for input(s): PROBNP in the last 8760 hours.  CBG: No results for input(s): GLUCAP in the last 168 hours.     SignedNita Sells MD   [facility] 09/24/2015, 10:06 AM

## 2015-09-24 NOTE — Progress Notes (Signed)
Pt. Left via wheelchair with family. Discharged to home with Hospice follow up. No respiratory distress noted.

## 2015-09-24 NOTE — Progress Notes (Signed)
Utilization review complete. Cherly Erno RN CCM Case Mgmt phone [phone] 

## 2015-09-24 NOTE — Progress Notes (Signed)
Discharge completed with teach back with pt. and family. Prescription given for ativan. Pt. Has follow up appointments. Hospice RN in for consult and will see pt. on Monday. Pt. Instructed to call Hospice with any concerns/problems.

## 2015-09-26 ENCOUNTER — Other Ambulatory Visit: Payer: Self-pay | Admitting: Oncology

## 2015-09-26 ENCOUNTER — Other Ambulatory Visit: Payer: BC Managed Care – PPO

## 2015-09-27 ENCOUNTER — Telehealth: Payer: Self-pay

## 2015-09-27 MED ORDER — LORAZEPAM 1 MG PO TABS: 1.0000 mg | ORAL_TABLET | ORAL | Status: AC | PRN

## 2015-09-27 MED ORDER — PROMETHAZINE HCL 25 MG PO TABS: 12.5000 mg | ORAL_TABLET | Freq: Four times a day (QID) | ORAL | Status: AC | PRN

## 2015-09-27 NOTE — Telephone Encounter (Signed)
Keisha from hospice called stating pt need refill on promethazine 25mg  tabs and lorazepam 1 mg tablets. Chart review showed neither of these on her current list of medications. Called back and pt said she did not get the lorazepam liquid filled b/c of the taste. Her last lorazepam tablet rx was 08/29/15 #30 take 1 tab q 4 hr prn. She was Rx promethazine by Selena Lesser on 09/02/15 #30 take 1/2 to 1 tab po q 6 hr prn. Pt states she alternates compazine (on her MAR) and phenergan. Will clarify with Dr Marko Plume before filling these.

## 2015-09-27 NOTE — Addendum Note (Signed)
Addended by: Janace Hoard on: 09/27/2015 03:10 PM   Modules accepted: Orders

## 2015-09-27 NOTE — Telephone Encounter (Signed)
S/w pt that rx were filled.

## 2015-09-27 NOTE — Telephone Encounter (Signed)
Opened in error

## 2015-09-27 NOTE — Telephone Encounter (Signed)
S/w Barbaraann Share RN, these medications were ordered in recent past and pt is under hospice care. Will refill.

## 2015-09-29 ENCOUNTER — Ambulatory Visit: Payer: BC Managed Care – PPO | Admitting: Oncology

## 2015-09-29 ENCOUNTER — Ambulatory Visit: Payer: BC Managed Care – PPO

## 2015-09-29 ENCOUNTER — Other Ambulatory Visit: Payer: BC Managed Care – PPO

## 2015-10-14 ENCOUNTER — Telehealth: Payer: Self-pay

## 2015-10-14 NOTE — Telephone Encounter (Signed)
Stacy Webster was calling to inform [name] that Stacy Webster passed away on 2015/10/23 at 1108 pm.

## 2015-10-15 ENCOUNTER — Encounter: Payer: Self-pay | Admitting: Oncology

## 2015-10-15 NOTE — Progress Notes (Signed)
MEDICAL ONCOLOGY  Patient died under care of Hospice on 14-Oct-2015  Sympathy letter written to family  Will let other MDs know by this entry, as patient and family very much appreciated their assistance with her care.  Godfrey Pick, MD

## 2015-10-17 ENCOUNTER — Telehealth: Payer: Self-pay | Admitting: Oncology

## 2015-10-17 NOTE — Telephone Encounter (Signed)
Death certificate received in [facility]. Patient status changed to deceased. °

## 2015-10-27 DEATH — deceased

## 2015-11-14 ENCOUNTER — Other Ambulatory Visit: Payer: Self-pay | Admitting: Nurse Practitioner

## 2017-03-08 IMAGING — CT CT CHEST W/ CM
2 of 5 series · 15 of 46 positions shown, 17 images · IV contrast (OMNIPAQUE)
Comparison: 03/23/2015 abdominal pelvic CT. No prior chest CT. PET
of 11/04/2014 is reviewed.

CLINICAL DATA: Cervical cancer diagnosed [DATE]. Just finished
chemotherapy. Right-sided ureteric stent. Status post radiation
therapy.

EXAM:
CT CHEST, ABDOMEN, AND PELVIS WITH CONTRAST
TECHNIQUE: Multidetector CT imaging of the chest, abdomen and pelvis was
performed following the standard protocol during bolus
administration of intravenous contrast.
CONTRAST:  100mL OMNIPAQUE IOHEXOL 300 MG/ML  SOLN

[Series 2: cap with st · axial · 0.70mm/px · z∈[-653,-93]mm · 12 of 127 slices shown, 14 images]
[im 8/127  soft-tissue]
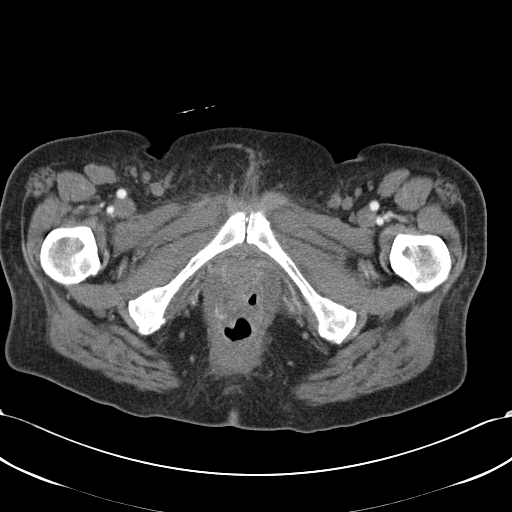
[im 8/127  bone]
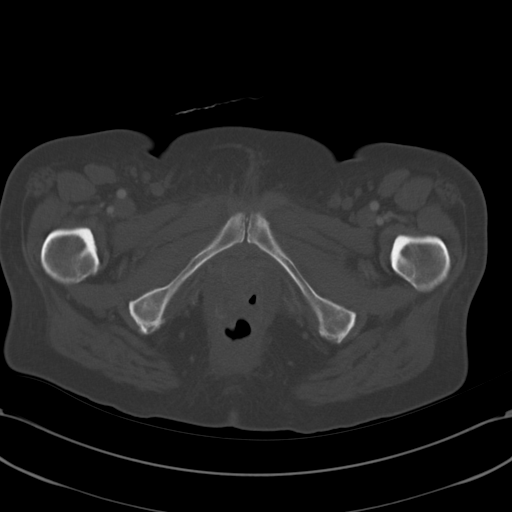
[im 22/127  soft-tissue]
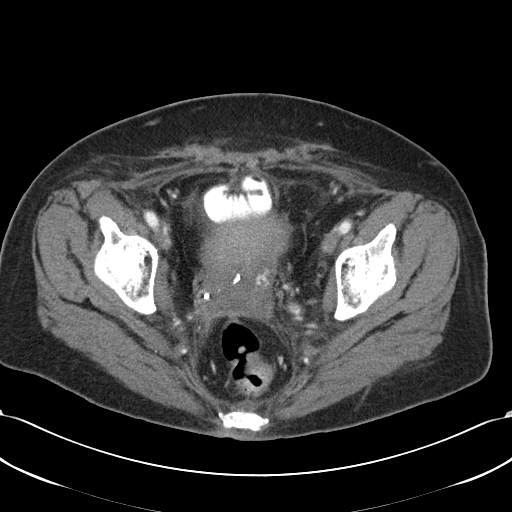
[im 29/127  soft-tissue]
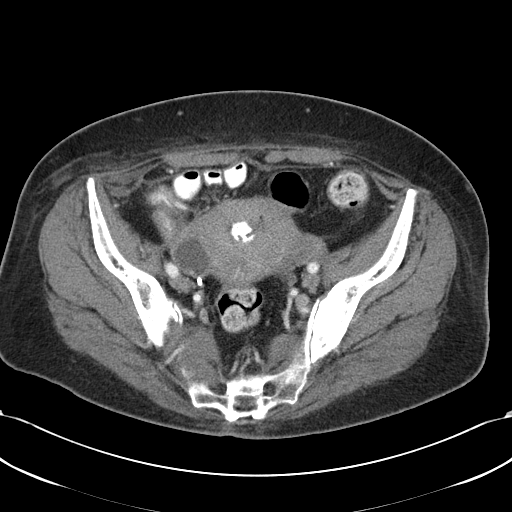
[im 36/127  soft-tissue]
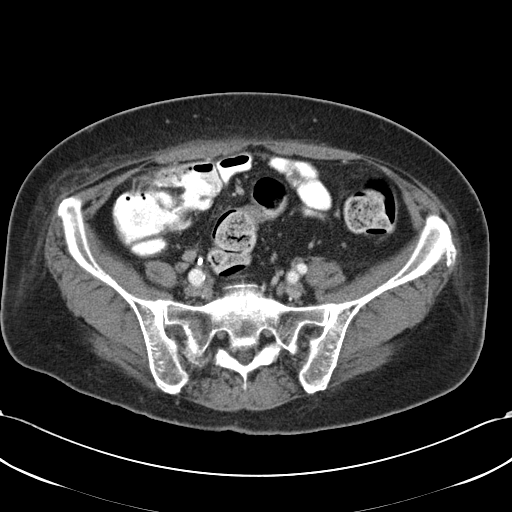
[im 50/127  soft-tissue]
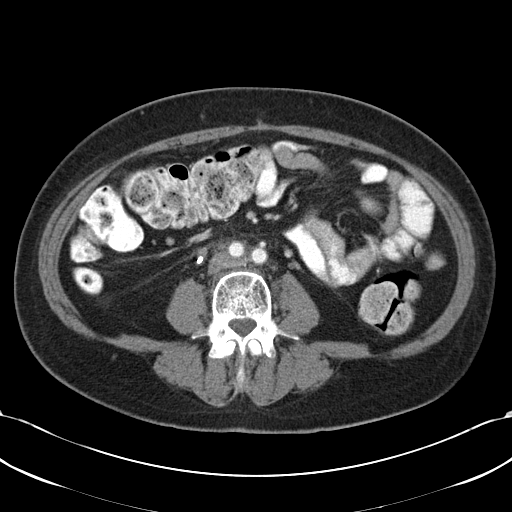
[im 57/127  soft-tissue]
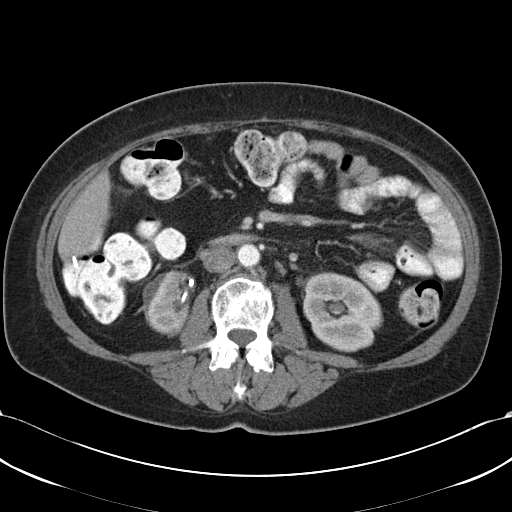
[im 71/127  soft-tissue]
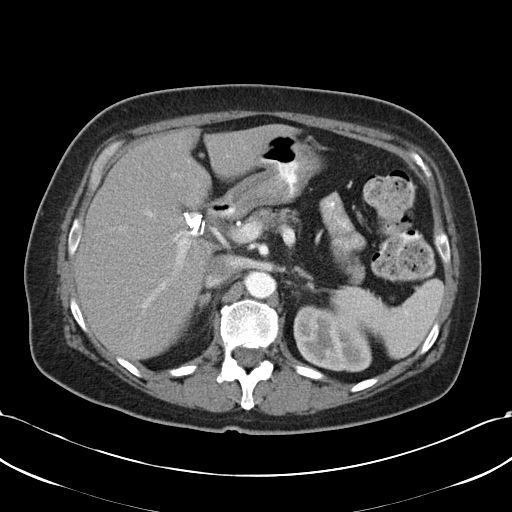
[im 78/127  soft-tissue]
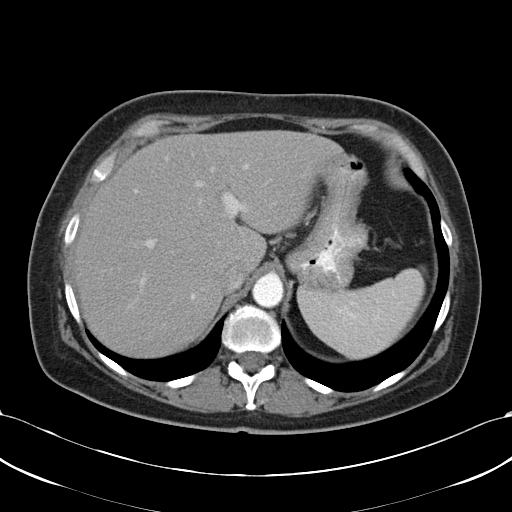
[im 92/127  soft-tissue]
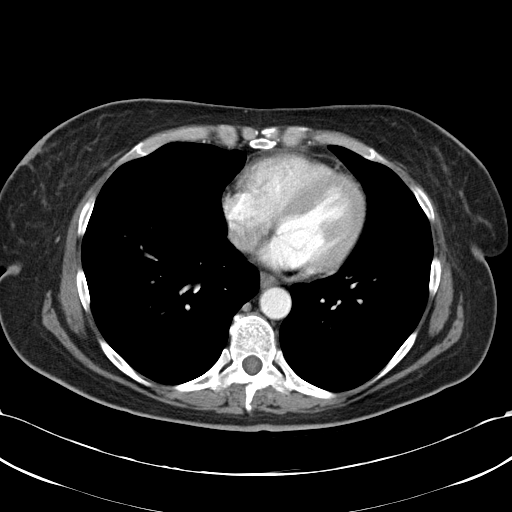
[im 92/127  bone]
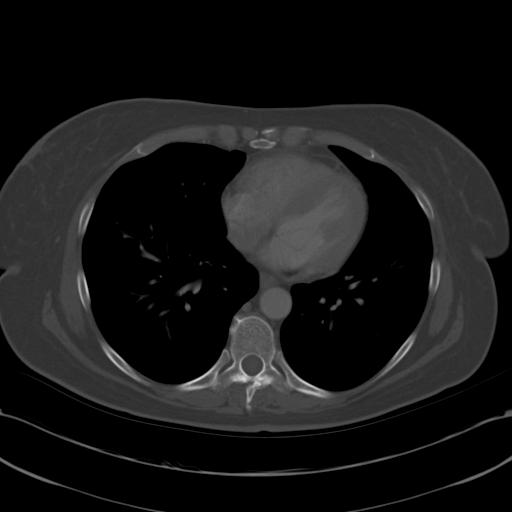
[im 99/127  soft-tissue]
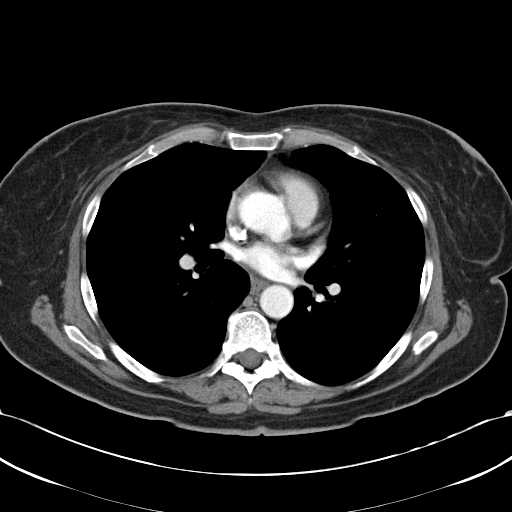
[im 106/127  soft-tissue]
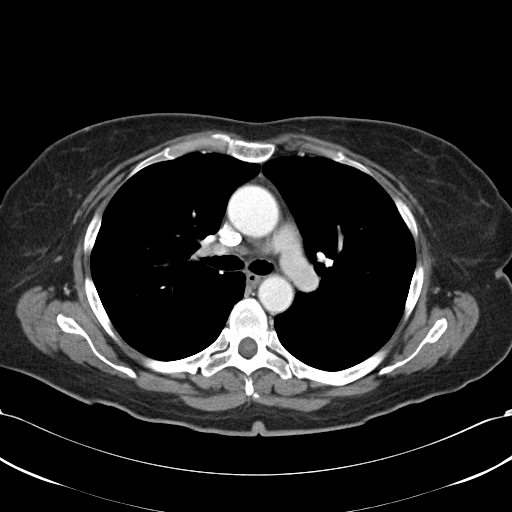
[im 120/127  soft-tissue]
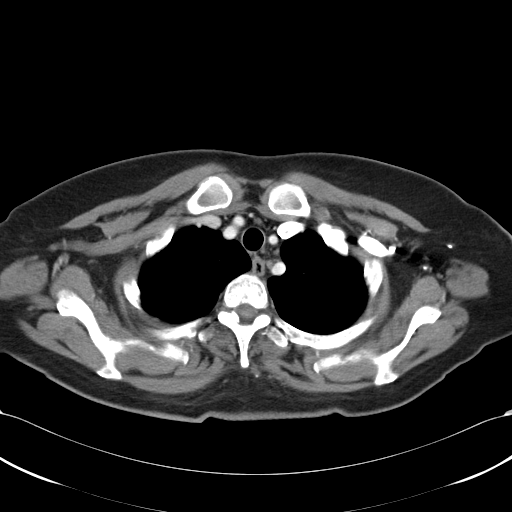

[Series 602: cor · coronal · 1.24mm/px · 3 of 79 slices shown]
[im 27/79  soft-tissue]
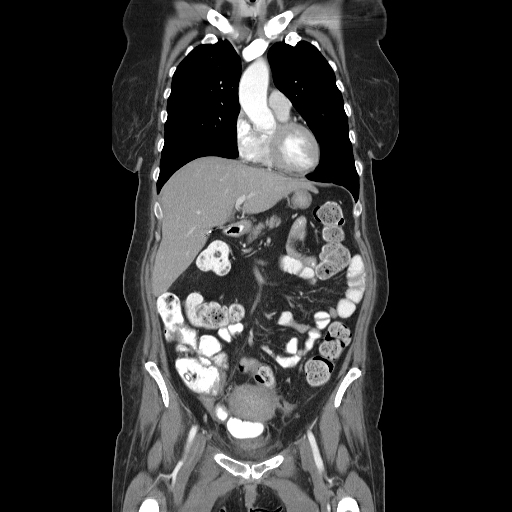
[im 35/79  soft-tissue]
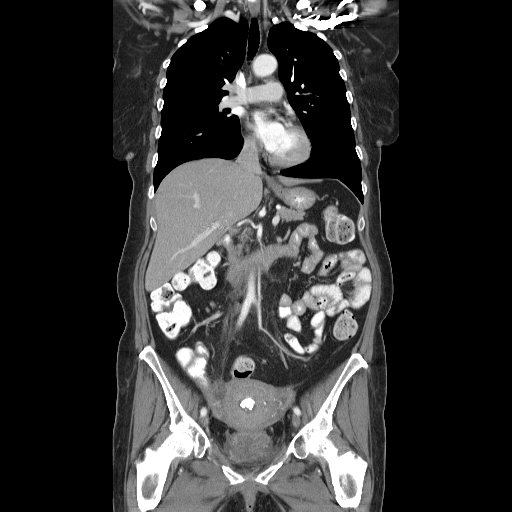
[im 44/79  soft-tissue]
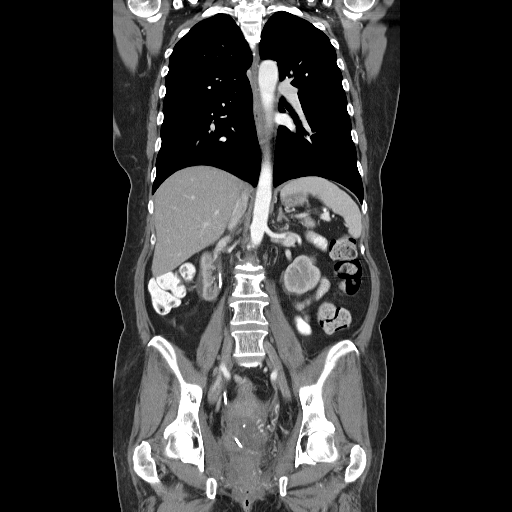

[15 of 46 positions shown; findings below may reference images not displayed]

FINDINGS: CT CHEST

Mediastinum/Nodes: No supraclavicular adenopathy. Normal heart size,
without pericardial effusion. No central pulmonary embolism, on this
non-dedicated study. No mediastinal or hilar adenopathy.

Lungs/Pleura: No pleural fluid.  Clear lungs.

Musculoskeletal: No acute osseous abnormality.

CT ABDOMEN AND PELVIS

Hepatobiliary: Normal liver. Cholecystectomy, without biliary ductal
dilatation.

Pancreas: Mild pancreatic atrophy involving the head and uncinate
process. No duct dilatation or acute inflammation.

Spleen: Normal in size, without focal abnormality.

Adrenals/Urinary Tract: Normal adrenal glands. Mild left-sided
caliectasis is unchanged. No hydroureter.

Interpolar left renal lesion is too small to characterize. right
renal atrophy. Right perinephric fluid collection is small and
similar. Right ureteric stent in place. This terminates in the
urinary bladder.

Moderate bladder wall thickening with surrounding edema. This is
superimposed upon underdistention. significantly increased.

Stomach/Bowel: Normal stomach, without wall thickening. Colonic
stool burden suggests constipation. Normal terminal ileum and
appendix. Slightly improved distal ileal wall thickening, including
on image 102/series 2. Small bowel otherwise unremarkable.

Vascular/Lymphatic: Normal caliber of the aorta and branch vessels.
No retroperitoneal or retrocrural adenopathy. Right inguinal node
measures 1.3 cm on image 110/series 2. 1.4 cm on the prior.

Reproductive: Endometrial fluid is decreased. Decreased endometrial
gas. Calcifications within and along the endometrium are likely
related to fibroids. Residual soft tissue fullness within the
uterine cervix with ill definition of surrounding fat planes. Felt
to be similar, including on image 108/series 2. Air identified in
the region of the vaginal fornices and endocervix including on
images 112 and 109 of series 2 respectively. Similar. The vaginal
fornix is intimately associated with the rectal wall, including on
image 111/series 2.

Right ovarian follicle or cyst.

Other: No significant free fluid.

Musculoskeletal: Sacroiliac joint sclerosis is likely degenerative.
Mild superior endplate irregularity at L3 is chronic.
IMPRESSION: CT CHEST IMPRESSION

No acute process or evidence of metastatic disease in the chest.

CT ABDOMEN AND PELVIS IMPRESSION

1. Similar soft tissue fullness within the lower uterine segment and
cervix. Decreased endometrial fluid and gas, likely secondary. Gas
within the endocervix and vaginal fornices is indeterminate. This
could relate to necrosis. Especially given the close association
with the rectum, fistulous communication cannot be excluded.
2. Right inguinal adenopathy is decreased minimally and suspicious.

3. Similar right perinephric fluid collection with renal atrophy and
ureteric stent in place.
4. Progressive bladder wall thickening, possibly related to
radiation induced cystitis. Improved presumed radiation induced
enteritis.
5. Similar mild left caliectasis, indeterminate.
6.  Possible constipation.
